# Patient Record
Sex: Male | Born: 1955 | Race: White | Hispanic: No | Marital: Single | Smoking: Never smoker
Health system: Southern US, Community
[De-identification: ages and names within clinical notes are randomized; demographics above are authoritative.]

## PROBLEM LIST (undated history)

## (undated) DIAGNOSIS — Z96619 Presence of unspecified artificial shoulder joint: Secondary | ICD-10-CM

## (undated) DIAGNOSIS — M109 Gout, unspecified: Secondary | ICD-10-CM

## (undated) DIAGNOSIS — F419 Anxiety disorder, unspecified: Secondary | ICD-10-CM

## (undated) DIAGNOSIS — M1990 Unspecified osteoarthritis, unspecified site: Secondary | ICD-10-CM

## (undated) DIAGNOSIS — T7840XA Allergy, unspecified, initial encounter: Secondary | ICD-10-CM

## (undated) DIAGNOSIS — Z87442 Personal history of urinary calculi: Secondary | ICD-10-CM

## (undated) DIAGNOSIS — K219 Gastro-esophageal reflux disease without esophagitis: Secondary | ICD-10-CM

## (undated) DIAGNOSIS — I10 Essential (primary) hypertension: Secondary | ICD-10-CM

## (undated) DIAGNOSIS — F4321 Adjustment disorder with depressed mood: Secondary | ICD-10-CM

## (undated) DIAGNOSIS — G43909 Migraine, unspecified, not intractable, without status migrainosus: Secondary | ICD-10-CM

## (undated) DIAGNOSIS — T84038A Mechanical loosening of other internal prosthetic joint, initial encounter: Secondary | ICD-10-CM

## (undated) DIAGNOSIS — T8459XA Infection and inflammatory reaction due to other internal joint prosthesis, initial encounter: Secondary | ICD-10-CM

## (undated) DIAGNOSIS — R7989 Other specified abnormal findings of blood chemistry: Secondary | ICD-10-CM

## (undated) DIAGNOSIS — Z973 Presence of spectacles and contact lenses: Secondary | ICD-10-CM

## (undated) HISTORY — PX: SHOULDER ARTHROSCOPY: SHX128

## (undated) HISTORY — DX: Gout, unspecified: M10.9

## (undated) HISTORY — PX: TOTAL SHOULDER ARTHROPLASTY: SHX126

## (undated) HISTORY — PX: EYE SURGERY: SHX253

## (undated) HISTORY — DX: Other specified abnormal findings of blood chemistry: R79.89

## (undated) HISTORY — PX: TOTAL KNEE ARTHROPLASTY: SHX125

## (undated) HISTORY — PX: SHOULDER OPEN ROTATOR CUFF REPAIR: SHX2407

## (undated) HISTORY — PX: BACK SURGERY: SHX140

## (undated) HISTORY — PX: LASIK: SHX215

## (undated) HISTORY — PX: SPINE SURGERY: SHX786

## (undated) HISTORY — PX: COLONOSCOPY W/ BIOPSIES AND POLYPECTOMY: SHX1376

## (undated) HISTORY — DX: Infection and inflammatory reaction due to other internal joint prosthesis, initial encounter: T84.59XA

## (undated) HISTORY — DX: Allergy, unspecified, initial encounter: T78.40XA

## (undated) HISTORY — PX: JOINT REPLACEMENT: SHX530

## (undated) HISTORY — PX: WISDOM TOOTH EXTRACTION: SHX21

## (undated) HISTORY — PX: KNEE ARTHROSCOPY: SHX127

## (undated) HISTORY — PX: COLONOSCOPY: SHX174

## (undated) HISTORY — PX: LUMBAR DISC SURGERY: SHX700

## (undated) HISTORY — PX: TOTAL SHOULDER REPLACEMENT: SUR1217

## (undated) HISTORY — DX: Presence of unspecified artificial shoulder joint: Z96.619

---
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
## 1998-06-13 ENCOUNTER — Emergency Department (HOSPITAL_COMMUNITY): Admission: EM | Admit: 1998-06-13 | Discharge: 1998-06-13 | Payer: Self-pay

## 1998-07-24 ENCOUNTER — Emergency Department (HOSPITAL_COMMUNITY): Admission: EM | Admit: 1998-07-24 | Discharge: 1998-07-24 | Payer: Self-pay | Admitting: Emergency Medicine

## 1998-08-07 ENCOUNTER — Emergency Department (HOSPITAL_COMMUNITY): Admission: EM | Admit: 1998-08-07 | Discharge: 1998-08-08 | Payer: Self-pay

## 1998-08-14 ENCOUNTER — Emergency Department (HOSPITAL_COMMUNITY): Admission: EM | Admit: 1998-08-14 | Discharge: 1998-08-14 | Payer: Self-pay | Admitting: Internal Medicine

## 1999-07-14 ENCOUNTER — Encounter: Payer: Self-pay | Admitting: Specialist

## 1999-07-21 ENCOUNTER — Inpatient Hospital Stay (HOSPITAL_COMMUNITY): Admission: RE | Admit: 1999-07-21 | Discharge: 1999-07-26 | Payer: Self-pay | Admitting: Specialist

## 1999-07-24 ENCOUNTER — Encounter: Payer: Self-pay | Admitting: Internal Medicine

## 1999-08-17 ENCOUNTER — Emergency Department (HOSPITAL_COMMUNITY): Admission: EM | Admit: 1999-08-17 | Discharge: 1999-08-17 | Payer: Self-pay | Admitting: Emergency Medicine

## 1999-10-06 ENCOUNTER — Emergency Department (HOSPITAL_COMMUNITY): Admission: EM | Admit: 1999-10-06 | Discharge: 1999-10-07 | Payer: Self-pay | Admitting: Emergency Medicine

## 2000-10-10 ENCOUNTER — Encounter: Admission: RE | Admit: 2000-10-10 | Discharge: 2000-11-14 | Payer: Self-pay | Admitting: Neurosurgery

## 2002-03-30 ENCOUNTER — Emergency Department (HOSPITAL_COMMUNITY): Admission: EM | Admit: 2002-03-30 | Discharge: 2002-03-30 | Payer: Self-pay | Admitting: Emergency Medicine

## 2003-05-13 ENCOUNTER — Inpatient Hospital Stay (HOSPITAL_COMMUNITY): Admission: RE | Admit: 2003-05-13 | Discharge: 2003-05-19 | Payer: Self-pay | Admitting: Specialist

## 2003-06-08 ENCOUNTER — Emergency Department (HOSPITAL_COMMUNITY): Admission: EM | Admit: 2003-06-08 | Discharge: 2003-06-08 | Payer: Self-pay | Admitting: Emergency Medicine

## 2006-02-08 ENCOUNTER — Encounter: Admission: RE | Admit: 2006-02-08 | Discharge: 2006-02-08 | Payer: Self-pay | Admitting: Specialist

## 2006-12-26 ENCOUNTER — Encounter: Admission: RE | Admit: 2006-12-26 | Discharge: 2006-12-26 | Payer: Self-pay | Admitting: Specialist

## 2009-02-15 ENCOUNTER — Ambulatory Visit (HOSPITAL_COMMUNITY): Admission: RE | Admit: 2009-02-15 | Discharge: 2009-02-15 | Payer: Self-pay | Admitting: Specialist

## 2009-03-03 ENCOUNTER — Encounter: Admission: RE | Admit: 2009-03-03 | Discharge: 2009-03-03 | Payer: Self-pay

## 2009-12-10 ENCOUNTER — Ambulatory Visit: Payer: Self-pay | Admitting: Cardiology

## 2009-12-10 ENCOUNTER — Observation Stay (HOSPITAL_COMMUNITY)
Admission: EM | Admit: 2009-12-10 | Discharge: 2009-12-10 | Payer: Self-pay | Source: Home / Self Care | Admitting: Emergency Medicine

## 2009-12-10 ENCOUNTER — Encounter (INDEPENDENT_AMBULATORY_CARE_PROVIDER_SITE_OTHER): Payer: Self-pay | Admitting: Emergency Medicine

## 2010-07-01 ENCOUNTER — Ambulatory Visit (HOSPITAL_BASED_OUTPATIENT_CLINIC_OR_DEPARTMENT_OTHER)
Admission: RE | Admit: 2010-07-01 | Discharge: 2010-07-01 | Payer: Self-pay | Source: Home / Self Care | Attending: Family Medicine | Admitting: Family Medicine

## 2010-07-27 ENCOUNTER — Encounter: Payer: Self-pay | Admitting: Family Medicine

## 2010-09-11 LAB — POCT CARDIAC MARKERS
CKMB, poc: <1 ng/mL — ABNORMAL LOW (ref 1.0–8.0)
CKMB, poc: <1 ng/mL — ABNORMAL LOW (ref 1.0–8.0)
Myoglobin, poc: 101 ng/mL (ref 12–200)
Myoglobin, poc: 91.1 ng/mL (ref 12–200)
Troponin i, poc: <0.05 ng/mL (ref 0.00–0.09)
Troponin i, poc: <0.05 ng/mL (ref 0.00–0.09)

## 2010-09-11 LAB — BASIC METABOLIC PANEL
BUN: 14 mg/dL (ref 6–23)
CO2: 24 mEq/L (ref 19–32)
Calcium: 9.7 mg/dL (ref 8.4–10.5)
Chloride: 108 mEq/L (ref 96–112)
Creatinine, Ser: 1.17 mg/dL (ref 0.4–1.5)
GFR calc Af Amer: >60 mL/min (ref >60)
GFR calc non Af Amer: >60 mL/min (ref >60)
Glucose, Bld: 122 mg/dL — ABNORMAL HIGH (ref 70–99)
Potassium: 3.5 mEq/L (ref 3.5–5.1)
Sodium: 140 mEq/L (ref 135–145)

## 2010-09-11 LAB — DIFFERENTIAL
Basophils Absolute: 0 10*3/uL (ref 0.0–0.1)
Basophils Relative: 0 % (ref 0–1)
Eosinophils Absolute: 0.1 10*3/uL (ref 0.0–0.7)
Eosinophils Relative: 2 % (ref 0–5)
Lymphocytes Relative: 24 % (ref 12–46)
Lymphs Abs: 1.6 10*3/uL (ref 0.7–4.0)
Monocytes Absolute: 0.6 10*3/uL (ref 0.1–1.0)
Monocytes Relative: 9 % (ref 3–12)
Neutro Abs: 4.3 10*3/uL (ref 1.7–7.7)
Neutrophils Relative %: 65 % (ref 43–77)

## 2010-09-11 LAB — CBC
HCT: 46.5 % (ref 39.0–52.0)
Hemoglobin: 16.1 g/dL (ref 13.0–17.0)
MCHC: 34.7 g/dL (ref 30.0–36.0)
MCV: 96.8 fL (ref 78.0–100.0)
Platelets: 152 10*3/uL (ref 150–400)
RBC: 4.81 MIL/uL (ref 4.22–5.81)
RDW: 13.4 % (ref 11.5–15.5)
WBC: 6.6 10*3/uL (ref 4.0–10.5)

## 2010-09-11 LAB — D-DIMER, QUANTITATIVE: D-Dimer, Quant: 0.38 ug/mL-FEU (ref 0.00–0.48)

## 2010-09-28 ENCOUNTER — Ambulatory Visit (HOSPITAL_COMMUNITY)
Admission: RE | Admit: 2010-09-28 | Discharge: 2010-09-28 | Disposition: A | Discharge status: Home / Self Care | Payer: BC Managed Care – PPO | Source: Ambulatory Visit | Attending: Gastroenterology | Admitting: Gastroenterology

## 2010-09-28 DIAGNOSIS — Z860100 Personal history of colon polyps, unspecified: Secondary | ICD-10-CM | POA: Insufficient documentation

## 2010-09-28 DIAGNOSIS — K648 Other hemorrhoids: Secondary | ICD-10-CM | POA: Insufficient documentation

## 2010-09-28 DIAGNOSIS — I10 Essential (primary) hypertension: Secondary | ICD-10-CM | POA: Insufficient documentation

## 2010-10-16 NOTE — Op Note (Signed)
  NAME:  Geoffrey West, Geoffrey West NO.:  [id number]  MEDICAL RECORD NO.:  [id number]           PATIENT TYPE:  O  LOCATION:  [facility]                         FACILITY:  [facility]  PHYSICIAN:  Shirley Friar, MDDATE OF BIRTH:  09-15-1955  DATE OF PROCEDURE:  09/28/2010 DATE OF DISCHARGE:                              OPERATIVE REPORT   INDICATIONS:  History of colon polyp, need to further evaluate rectum for any recurrence.  MEDICATIONS:  Benadryl 25 mg IV, Versed 6 mg IV.  FINDINGS:  Rectal exam was unremarkable.  A pediatric colonoscope was inserted through a well-prepped colon and inserted into the rectum.  The rectum was unremarkable without evidence of any recurrent polyps or mucosal abnormalities.  The colonoscope was advanced in to the sigmoid colon where the patient developed discomfort and no further advancement was done.  On careful withdrawal of the colonoscope from the sigmoid colon, there were no mucosal abnormalities seen.  The rectum was again reevaluated and was unremarkable.  Retroflexion was done which revealed small internal hemorrhoids.  ASSESSMENT: 1. Small internal hemorrhoids, otherwise normal flexible sigmoidoscopy     of the sigmoid colon. 2. No recurrent polyp seen in rectum.  PLAN:  Repeat colonoscopy in May 2018, which is 10 years from his last colonoscopy.     Shirley Friar, MD     VCS/MEDQ  D:  09/28/2010  T:  09/29/2010  Job:  [id number]  cc:   [name], M.D.  Electronically Signed by Charlott Rakes MD on 10/16/2010 11:41:25 AM

## 2010-11-11 NOTE — Discharge Summary (Signed)
NAME:  Geoffrey West, Geoffrey West                        ACCOUNT NO.:  [id number]   MEDICAL RECORD NO.:  [id number]                   PATIENT TYPE:  INP   LOCATION:  0471                                 FACILITY:  [facility]   PHYSICIAN:  Erasmo Leventhal, M.D.         DATE OF BIRTH:  May 29, 1956   DATE OF ADMISSION:  05/13/2003  DATE OF DISCHARGE:  05/19/2003                                 DISCHARGE SUMMARY   ADMISSION DIAGNOSIS:  Total knee arthroplasty, left knee.   DISCHARGE DIAGNOSES:  1. Total knee arthroplasty, left knee.  2. Colitis.   BRIEF HISTORY:  This is a 55 year old gentleman with a long history of  osteoarthritis, previous total knee replacement of his right knee, has been  having problems with his left knee, and has failed all conservative measures  including arthroscopy, debridement, chondroplasty, medications and  injections.  Because of significant pain, he requests total knee  arthroplasty.  Surgery risks, benefits, and aftercare were discussed in  detail.  Questions were invited and answered and surgery is to go ahead as  scheduled.   LABORATORY VALUES:  Admission CBC within normal limits with the exception of  7 eosinophils.  Hemoglobin and hematocrit reached a low of 11.9 and 33.8 on  the 21st.  PT and INR's were normal at admission and were 19.7 with an INR  of 2.1 on the 23rd.  Admission CMET showed the potassium low at 3, otherwise  within normal limits.  He did have some problems with hypokalemia through  the admission and his last BMET showed that to have cleared.  Glucose was  elevated intermittently throughout the admission and was 110 on discharge.  Stool cultures for Clostridium difficile toxin were negative.   COURSE IN THE HOSPITAL:  The patient tolerated the operative procedure well.  The first postoperative day he was feeling pretty good, his vital signs were  stable, he was afebrile with oxygen saturation of 95%.  Hemoglobin and  hematocrit were  stable.  His potassium came back up to 3.4, glucose was  mildly elevated.  I&O's were good.  His drain was removed.  Lungs were  clear.  Calves were negative.  He was subsequently started on bed-to-chair  and CPM.  The second postoperative day vital signs remained stable.  Temp  was to 101.2 last night but now 99.4.  Hemoglobin and hematocrit were  stable, BMET within normal limits with the exception of slightly elevated  glucose.  PT was 14.7 with INR 1.2.  Lungs were clear except right bases had  slightly lower sounds.  He was encouraged in incentive spirometer use two  times an hour while awake.  His epidural was subsequently removed.  Portable  chest x-ray was obtained to rule out pneumonia and this just showed shallow  lung inflation with cardiomegaly.  Due to his continued fever, a medical  consult was obtained.  He had had a previous sinusitis prior to admission  that  was being treated by [name], M.D. his family physician.  Subsequently in the hospital, he was started on Avelox 400 mg daily, Nasonex  by a medical consult.  The third postoperative day he started having  increased gas, flatus, and a loose bowel movement.  There was a question of  partial ileus and he was subsequently switched to a full liquid diet. The  following day he continued to have diarrhea and gas.  His bowels were  active.  His abdomen was soft.  Stool was checked for Clostridium difficile  and guaiac and subsequent consult with general surgery was obtained.  He was  felt to have Clostridium difficile colitis.  He was slightly hypokalemic and  he was placed on oral potassium supplements and other antibiotics were  stopped and Flagyl was started.  The fourth postoperative day he was feeling  better, he was resting comfortably, temp was to a max of 101.8, vital signs  were stable.  He was afebrile at times.  His bowel sounds were slowing down.  His abdomen was soft.  He continued with total knee  precautions and PT and  OT.  His potassium continued to be corrected by medical service.  Postoperative day #5, he was feeling much better and wanted to go home.  His  vital signs were stable.  He was afebrile.  Calf was negative.  Abdominal  pain was decreased and he was still having six to seven small bowel  movements.  It was felt he was not ready to go home at this time and was  kept for another day of observation.  [name], M.D. continued him  on a 10-day course of Flagyl at 500 mg t.i.d. and recommended follow up with  his medical doctor at discharge.  On the sixth postoperative day, he is  feeling good.  He was ready to go home.  Vital signs were stable.  He was  afebrile.  Bowel movements were down to just two the previous day.  No  complaints of abdominal pain.  He was stabilized and feeling better and  subsequently is discharged home.   CONDITION ON DISCHARGE:  Improved.   DISCHARGE MEDICATIONS:  1. Percocet one to two pills every 6 hours as needed for pain.  2. Robaxin 500 mg one p.o. q.8 h. p.r.n. spasm.  3. Coumadin as directed by pharmacy.  4. Trinsicon one pill twice a day for anemia.  5. Flagyl 500 mg p.o. t.i.d. for 10 days total.   DISCHARGE INSTRUCTIONS:  He is to do his home therapy, eat a banana a day  and drink Gatorade, slowly advance his diet.  He is to see [name],  M.D. the week of discharge for follow up of his colitis and to see [place] in 10  days.     [name], P.A.                   Erasmo Leventhal, M.D.    SJC/MEDQ  D:  06/06/2003  T:  06/06/2003  Job:  [id number]

## 2010-11-11 NOTE — H&P (Signed)
NAME:  Geoffrey West, Geoffrey West NO.:  [id number]   MEDICAL RECORD NO.:  [id number]                   PATIENT TYPE:  INP   LOCATION:  NA                                   FACILITY:  [facility]   PHYSICIAN:  Erasmo Leventhal, M.D.         DATE OF BIRTH:  August 31, 1955   DATE OF ADMISSION:  DATE OF DISCHARGE:                                HISTORY & PHYSICAL   CHIEF COMPLAINT:  Left knee end-stage osteoarthritis.   HISTORY OF PRESENT ILLNESS:  This is a 55 year old gentleman with a long  history of osteoarthritis with previous total knee replacement of his right  knee who has been having problems with his left knee that has failed all  conservative measures including arthroscopy, debridement, and chondroplasty.  After discussion of treatment, risks, benefits, and options, the patient is  scheduled now for total knee arthroplasty of the left knee.   DRUG ALLERGIES:  OXYCONTIN.   CURRENT MEDICATIONS:  1. Atenolol. He is unsure of the dosage.  2. Lipitor 10 mg q.d.  3. Accutane once a week on Monday.   PAST SURGICAL HISTORY:  1. Right rotator cuff repair.  2. Left knee arthroscopy.  3. Right knee arthroscopy.  4. Right knee total knee replacement.   SERIOUS MEDICAL ILLNESSES:  1. Hypertension.  2. Hypercholesterolemia.  3. Acne.   FAMILY HISTORY:  Positive for coronary artery disease and arthritis.   SOCIAL HISTORY:  The patient is single. He works as a Runner, broadcasting/film/video. He does not  smoke or drink.   REVIEW OF SYSTEMS:  NERVOUS SYSTEM:  Positive for migraine headaches,  otherwise negative for blurry vision or dizziness. PULMONARY:  Negative for  shortness of breath, PND, or orthopnea. CARDIOVASCULAR:  Positive for  hypertension. Negative for chest pain or palpitations.  GASTROINTESTINAL:  Negative for ulcers or hepatitis. GENITOURINARY:  Negative for urinary tract difficulty. MUSCULOSKELETAL:  Positives in HPI.   PHYSICAL EXAMINATION:  VITAL SIGNS:  BP  120/80, 72 for pulse, respirations  16.  GENERAL APPEARANCE:  This is a well-developed, well-nourished gentleman in  no acute distress.  HEENT:  His head is normocephalic. Nose patent. Ears patent. Pupils are  equal, round, and reactive to light. Throat without injection.  NECK:  Supple without adenopathy. Carotids 2+ without bruit.  CHEST:  Clear to auscultation. No rales or rhonchi. Respirations 16.  HEART:  Regular rate and rhythm at 72 beats per minute without murmur.  ABDOMEN:  Soft with active bowel sounds. No masses or organomegaly.  NEUROLOGICAL:  The patient is alert and oriented to time, place, and person.  Cranial nerves II-XII grossly intact.  EXTREMITIES:  Within normal limits with the exception of the right knee  which is status post total knee arthroplasty. He has 0 to 110 degree range  of motion with good stability. Left knee shows a varus deformity pain with  range of motion. Dorsalis pedis and posterior tibialis pulses are 2+. X-rays  show osteoarthritis with a varus deformity of the left knee.   IMPRESSION:  Osteoarthritis with varus deformity, left knee.   PLAN:  Total knee arthroplasty of the left knee.      [name], P.A.                   Erasmo Leventhal, M.D.    SJC/MEDQ  D:  05/06/2003  T:  05/06/2003  Job:  [id number]

## 2010-11-11 NOTE — Op Note (Signed)
NAME:  Geoffrey West, Geoffrey West NO.:  [id number]   MEDICAL RECORD NO.:  [id number]                   PATIENT TYPE:  INP   LOCATION:  0011                                 FACILITY:  [facility]   PHYSICIAN:  Erasmo Leventhal, M.D.         DATE OF BIRTH:  Sep 05, 1955   DATE OF PROCEDURE:  05/13/2003  DATE OF DISCHARGE:                                 OPERATIVE REPORT   PREOPERATIVE DIAGNOSIS:  Left knee end-stage osteoarthritis.   POSTOPERATIVE DIAGNOSIS:  Left knee end-stage osteoarthritis.   PROCEDURE:  Left total knee arthroplasty.   SURGEON:  R. Valma Cava, M.D.   ASSISTANT:  [name], P.A.-C   ANESTHESIA:  Spinal epinephrine.   ESTIMATED BLOOD LOSS:  Less than 50 mL.   DRAINS:  Two medium Hemovac.   COMPLICATIONS:  None.   TOURNIQUET TIME:  1 hour 50 minutes at 350 mmHg.   DISPOSITION:  PACU stable.   OPERATIVE IMPLANTS:  Osteonics components all cemented. A size 11 femur,  size 11 tibia, 10 mm flex insert with a 28 mm patella.   DESCRIPTION OF PROCEDURE:  The patient was counseled in the holding area. He  had undergone preoperative medical clearance from his primary care  physician, Dr. Marinda Elk, from a respiratory tract infection he had  recently. He was afebrile and relatively asymptomatic at this time. The  chart was viewed and signed appropriately, taken to the operating room.  Preoperative Ancef was given 1 g. Spinal epidural was administered. Properly  padded and bumped. Foley catheter placed utilizing sterile technique by the  OR circulator. The left lower extremity was elevated, 5 degree flexion  contracture with flexion to 120 degrees. Elevated, prepped with DuraPrep and  all draped in a sterile fashion. Exsanguinated with esmarch and tourniquet  was inflated to 350 mmHg.   A straight midline incision was made through the skin and subcutaneous  tissue, small bleeders electrocoagulated. Medial and lateral soft tissue  flaps were developed at the appropriate level. Medial parapatellar  arthrotomy was performed, soft tissue released down on the proximal medial  tibia due to the knee being in varus. The knee was then flexed, patella was  everted, end-stage arthritic changes, a large amount of bone against bone  areas, a lot of synovitis and a synovectomy performed.   The cruciate ligaments were resected. A starting hole made in the distal  femur, canal was irrigated until the effluent was clear, intramedullary rod  was gently placed. This gentleman had very hard bone.   I chose a 5 degree valgus cut for the left knee, took a 10 mm cut off the  distal femur, distal femur was found to be a size 11. Rotation marks were  made and the distal femur was cut to fit a size #11. Rotational marks.   Tibial eminence was resected, medial and lateral menisci removed, geniculate  vessels coagulated, posterior neurovascular structures were thought of and  protected throughout the entire case. Osteophytes were removed from the  proximal and medial tibia. The proximal tibia was found to be a size 11,  starting hole was made, step-drill was utilized. The canal was irrigated  until the effluent was clear, intramedullary rod was gently placed. I chose  a 10 mm cut off the lateral side which was the least deficient side and it  was surgically sloped, the proximal tibia was cut. Posteromedial and  posterofemoral osteophytes removed under direct visualization. The femoral  cut was prepared in standard fashion. At this point in time, a size 11  femur, size 11 tibia with a 10 mm flex insert, we had excellent range of  motion, soft tissue balance and alignment and rotation marks were made and  the delta keel was performed in standard fashion.   The patella was found to be a size 28 and was reamed to a depth of 10 mm,  locking holes were made and excess bone was removed. At this time, the knee  was then irrigated with pulsatile  lavage copiously and the cement was  properly mixed on the back table utilizing modern cement technique. All  components were cemented into place, size 11 tibia, size 11 femur with a 28  patella. After the cement had cured with the 10 mm flex insert, we had  excellent range of motion, soft tissue balance, and flexion extension and  patellofemoral tracking was anatomic. The trial was removed, excess cement  was removed, geniculates were recoagulated. The knee was thoroughly  irrigated with antibiotic solution. A final 10 mm flex tibial insert pole  was implanted. At this point in time, we put bone wax on exposed bony  surfaces, two medium Hemovac drains were placed. The knee joint and all soft  tissue was irrigated with antibiotic solution during the closure.   The arthrotomy was closed with Vicryl, subcu Vicryl and skin closed with  subcuticular Monocryl suture. Steri-Strips were applied, sterile compressive  dressing with Xeroform around the drain. The drain was later hooked to  suction. I will also note during the receiving of the implants, a 20 mm  patella button was given to me and I accidentally dropped this on the floor,  it was not used. We had to get another 28 mm patella.   Sterile compressive dressing applied to the knee, tourniquet was deflated.  We had normal pulses in the foot and ankle at the end of the case. There  were no complications, sponge and needle count were correct. He was given  another gram of Ancef intravenously, tourniquet deflated, taken from the  operating room to PACU in stable condition.                                               Erasmo Leventhal, M.D.    RAC/MEDQ  D:  05/13/2003  T:  05/13/2003  Job:  [id number]

## 2010-11-11 NOTE — Consult Note (Signed)
NAME:  Geoffrey West, Geoffrey West                        ACCOUNT NO.:  [id number]   MEDICAL RECORD NO.:  [id number]                   PATIENT TYPE:  INP   LOCATION:  0471                                 FACILITY:  [facility]   PHYSICIAN:  [name], M.D.            DATE OF BIRTH:  04-Oct-1955   DATE OF CONSULTATION:  05/16/2003  DATE OF DISCHARGE:                                   CONSULTATION   PHYSICIAN REQUESTING CONSULTATION:  Dr. Jene Every   REASON FOR CONSULTATION:  Abdominal pain, distention, diarrhea.   HISTORY OF PRESENT ILLNESS:  Mr. Geoffrey West is a 55 year old male who is postop  day #3 from a left total knee replacement by Dr. Hayden Rasmussen.  Approximately a day and a half ago he began to have some crampy abdominal  pain, flatus, and now has developed some diarrhea and distention.  He also  has low-grade fever.  He reports that he had been on amoxicillin immediately  prior to surgery for a sinus infection.  He has had Avelox started on  May 15, 2003.  He is also running some fever.  He states he had a right  knee replacement and had something similar to this but he thought he was  self-limited.   PAST MEDICAL HISTORY:  1. Hypertension.  2. Hyperlipidemia.  3. Degenerative joint disease.  4. Migraine headaches.   PREVIOUS OPERATIONS:  1. Right total knee replacement.  2. He had right knee surgery and ACL reconstruction multiple times prior to     that.  3. Rotator cuff repair x2.  4. Left knee arthroscopy.   ALLERGIES:  None.   CURRENT MEDICATIONS:  Colace, Senokot, Trinsicon, warfarin, Zocor, Afrin,  Tenormin, Dilaudid, Avelox, Aciphex, Pepcid.   SOCIAL HISTORY:  Non-tobacco user, denies alcohol use.   REVIEW OF SYSTEMS:  CARDIAC:  He has hypertension but no known coronary  disease.  PULMONARY:  He has no asthma or COPD.  GI:  He denies hepatitis,  diverticulitis, peptic ulcer disease, reflux.  GU:  No kidney stones.   PHYSICAL EXAMINATION:  GENERAL:   Well-developed, well-nourished male who is  in no acute distress.  VITAL SIGNS:  His max temperature is 100.6, blood pressure is 134/79, pulse  94.  EYES:  Extraocular muscles are intact, no icterus noted.  RESPIRATORY:  The breath sounds are equal and clear, and respirations  unlabored.  ABDOMEN:  Soft with some moderate distention.  There is no tenderness to  palpation or percussion.  No hernia is noted.  He has active bowel sounds  present.  GU:  No inguinal hernias noted.  No penile lesions.   Abdominal x-rays demonstrate gas-filled colon with some gas in the small  bowel.  There is some thumbprinting in the right colon and possibly  transverse colon which would suggest mucosal edema radiographically.  No  free air.   LABORATORY DATA:  Hemoglobin 12.4, white count 10,400.  Potassium 3.3.  INR  1.4.   No fecal occult blood noted.  C. diff is pending.   IMPRESSION:  Intermittent lower abdominal pain with diarrhea, distention and  x-ray.  His history, physical exam, and x-ray findings are suggestive of  Clostridium difficile colitis possibly related to preoperative amoxicillin.  He also has some hypokalemia.   RECOMMENDATIONS:  Correct potassium.  Start oral Flagyl or switch it to IV  if needed.  Discontinue the Avelox.  Keep him on a liquid diet for now.  If  the first C. diff is negative, I would repeat it.                                               [name], M.D.    Kari Baars  D:  05/16/2003  T:  05/16/2003  Job:  [id number]   cc:   Jene Every, M.D.  [address]  [place]  [place] [address]  Fax: [phone]   Erasmo Leventhal, M.D.  [address]  [place]  [place] [address]  Fax: [phone]

## 2011-01-05 ENCOUNTER — Other Ambulatory Visit: Payer: Self-pay | Admitting: Orthopaedic Surgery

## 2011-01-05 DIAGNOSIS — M545 Low back pain, unspecified: Secondary | ICD-10-CM

## 2011-01-08 ENCOUNTER — Ambulatory Visit
Admission: RE | Admit: 2011-01-08 | Discharge: 2011-01-08 | Disposition: A | Discharge status: Home / Self Care | Payer: BC Managed Care – PPO | Source: Ambulatory Visit | Attending: Orthopaedic Surgery | Admitting: Orthopaedic Surgery

## 2011-01-08 DIAGNOSIS — M545 Low back pain, unspecified: Secondary | ICD-10-CM

## 2012-05-18 ENCOUNTER — Encounter (HOSPITAL_BASED_OUTPATIENT_CLINIC_OR_DEPARTMENT_OTHER): Payer: Self-pay | Admitting: *Deleted

## 2012-05-18 ENCOUNTER — Emergency Department (HOSPITAL_BASED_OUTPATIENT_CLINIC_OR_DEPARTMENT_OTHER)
Admission: EM | Admit: 2012-05-18 | Discharge: 2012-05-18 | Disposition: A | Discharge status: Home / Self Care | Payer: BC Managed Care – PPO | Attending: Emergency Medicine | Admitting: Emergency Medicine

## 2012-05-18 DIAGNOSIS — S058X9A Other injuries of unspecified eye and orbit, initial encounter: Secondary | ICD-10-CM | POA: Insufficient documentation

## 2012-05-18 DIAGNOSIS — T1590XA Foreign body on external eye, part unspecified, unspecified eye, initial encounter: Secondary | ICD-10-CM | POA: Insufficient documentation

## 2012-05-18 DIAGNOSIS — H5789 Other specified disorders of eye and adnexa: Secondary | ICD-10-CM | POA: Insufficient documentation

## 2012-05-18 DIAGNOSIS — Z79899 Other long term (current) drug therapy: Secondary | ICD-10-CM | POA: Insufficient documentation

## 2012-05-18 DIAGNOSIS — I10 Essential (primary) hypertension: Secondary | ICD-10-CM | POA: Insufficient documentation

## 2012-05-18 DIAGNOSIS — S0500XA Injury of conjunctiva and corneal abrasion without foreign body, unspecified eye, initial encounter: Secondary | ICD-10-CM

## 2012-05-18 DIAGNOSIS — H5710 Ocular pain, unspecified eye: Secondary | ICD-10-CM | POA: Insufficient documentation

## 2012-05-18 DIAGNOSIS — H579 Unspecified disorder of eye and adnexa: Secondary | ICD-10-CM | POA: Insufficient documentation

## 2012-05-18 DIAGNOSIS — Z23 Encounter for immunization: Secondary | ICD-10-CM | POA: Insufficient documentation

## 2012-05-18 DIAGNOSIS — Y9389 Activity, other specified: Secondary | ICD-10-CM | POA: Insufficient documentation

## 2012-05-18 DIAGNOSIS — Y9289 Other specified places as the place of occurrence of the external cause: Secondary | ICD-10-CM | POA: Insufficient documentation

## 2012-05-18 HISTORY — DX: Essential (primary) hypertension: I10

## 2012-05-18 MED ORDER — TETANUS-DIPHTH-ACELL PERTUSSIS 5-2.5-18.5 LF-MCG/0.5 IM SUSP
0.5000 mL | Freq: Once | INTRAMUSCULAR | Status: AC
  Administered 2012-05-18: 0.5 mL via INTRAMUSCULAR
  Filled 2012-05-18: qty 0.5

## 2012-05-18 MED ORDER — TETRACAINE HCL 0.5 % OP SOLN
2.0000 [drp] | Freq: Once | OPHTHALMIC | Status: AC
  Administered 2012-05-18: 2 [drp] via OPHTHALMIC

## 2012-05-18 MED ORDER — TETRACAINE HCL 0.5 % OP SOLN
OPHTHALMIC | Status: AC
  Administered 2012-05-18: 2 [drp] via OPHTHALMIC
  Filled 2012-05-18: qty 2

## 2012-05-18 MED ORDER — FLUORESCEIN SODIUM 1 MG OP STRP
1.0000 | ORAL_STRIP | Freq: Once | OPHTHALMIC | Status: AC
  Administered 2012-05-18: 1 via OPHTHALMIC

## 2012-05-18 MED ORDER — FLUORESCEIN SODIUM 1 MG OP STRP
ORAL_STRIP | OPHTHALMIC | Status: AC
  Administered 2012-05-18: 1 via OPHTHALMIC
  Filled 2012-05-18: qty 1

## 2012-05-18 MED ORDER — TOBRAMYCIN-DEXAMETHASONE 0.3-0.1 % OP SUSP: 1.0000 [drp] | OPHTHALMIC | Status: DC

## 2012-05-18 NOTE — ED Provider Notes (Signed)
History  This chart was scribed for Shereena Berquist Smitty Cords, MD by Shari Heritage, ED Scribe. The patient was seen in room MH04/MH04. Patient's care was started at 1930.   CSN: [id number]  Arrival date & time 05/18/12  4098   First MD Initiated Contact with Patient 05/18/12 1930      Chief Complaint  Patient presents with  . Foreign Body in Eye     Patient is a 56 y.o. male presenting with foreign body in eye. The history is provided by the patient. No language interpreter was used.  Foreign Body in Eye This is a new problem. The current episode started 3 to 5 hours ago. The problem occurs constantly. The problem has not changed since onset.Pertinent negatives include no chest pain. Nothing aggravates the symptoms. Nothing relieves the symptoms. He has tried nothing (water flushing, drops) for the symptoms. The treatment provided no relief.    HPI Comments: Geoffrey West is a 56 y.o. male who presents to the Emergency Department complaining of foreign body in right eye with associated mild to moderate pain, redness, tearing and itching onset 2-3 hours ago. Patient denies blurred vision or other visual changes. Patient states that he was working outdoors today and a piece of plant brush grazed his eye. He states that flushed his eye and applied drops, but gritty sensation in his eye has persisted. Patient has a medical history of lasik eye surgery to right eye. He wears a contact in the left eye. Other medical history includes HTN. Patient does not smoke.  Eye surgeon - [place]  Past Medical History  Diagnosis Date  . Hypertension     Past Surgical History  Procedure Date  . Replacement total knee   . Shoulder surgery     History reviewed. No pertinent family history.  History  Substance Use Topics  . Smoking status: Never Smoker   . Smokeless tobacco: Not on file  . Alcohol Use: No      Review of Systems  Eyes: Positive for pain, discharge, redness and itching.  Negative for visual disturbance.  Cardiovascular: Negative for chest pain.  All other systems reviewed and are negative.    Allergies  Fentanyl  Home Medications   Current Outpatient Rx  Name  Route  Sig  Dispense  Refill  . AMLODIPINE BESYLATE PO   Oral   Take by mouth.         . INDERAL PO   Oral   Take by mouth.           Triage Vitals: BP 148/95  Pulse 58  Temp 97.8 F (36.6 C) (Oral)  Resp 20  Ht 5\' 11"  (1.803 m)  Wt 215 lb (97.523 kg)  BMI 29.99 kg/m2  SpO2 100%  Physical Exam  Constitutional: He is oriented to person, place, and time. He appears well-developed and well-nourished. No distress.  HENT:  Head: Normocephalic and atraumatic.  Mouth/Throat: Oropharynx is clear and moist and mucous membranes are normal. Mucous membranes are not dry.  Eyes: EOM are normal. Pupils are equal, round, and reactive to light. Right eye exhibits no chemosis and no discharge. No foreign body present in the right eye. Left eye exhibits no chemosis and no discharge. Right conjunctiva is injected.  Fundoscopic exam:      The right eye shows no AV nicking.  Slit lamp exam:      The right eye shows corneal abrasion.       Corneal abrasion at 2:00 to 4:00  position. No foreign body. Lash line is normal. No swelling of the lids. No cells in the anterior chamber. No AV nicking.  Neck: Normal range of motion.  Cardiovascular: Normal rate and regular rhythm.   No murmur heard. Pulmonary/Chest: Effort normal and breath sounds normal. No respiratory distress. He has no wheezes. He has no rales.  Abdominal: Soft. There is no tenderness. There is no rebound and no guarding.  Musculoskeletal: Normal range of motion.  Neurological: He is alert and oriented to person, place, and time.  Skin: Skin is warm and dry. No rash noted. He is not diaphoretic. No erythema.  Psychiatric: He has a normal mood and affect. His behavior is normal.    ED Course  Procedures (including critical care  time) DIAGNOSTIC STUDIES: Oxygen Saturation is 100% on room air, normal by my interpretation.    COORDINATION OF CARE: 7:37 PM- Patient informed of current plan for treatment and evaluation and agrees with plan at this time.      Labs Reviewed - No data to display No results found.   No diagnosis found.    MDM  Follow up on Monday with Dr. Delaney Meigs for ongoing care.  Patient verbalizes understanding and agrees to follow up    I personally performed the services described in this documentation, which was scribed in my presence. The recorded information has been reviewed and is accurate.    Jasmine Awe, MD 05/18/12 1943

## 2012-05-18 NOTE — ED Notes (Signed)
Pt states he was working outside today and thinks a piece of "brush" may have gotten into his right eye. Flushed, but still uncomfortable. Red, teary, itching. No blurred vision.

## 2013-07-04 ENCOUNTER — Other Ambulatory Visit: Payer: Self-pay | Admitting: Neurological Surgery

## 2013-07-05 DIAGNOSIS — Z5181 Encounter for therapeutic drug level monitoring: Secondary | ICD-10-CM | POA: Insufficient documentation

## 2013-07-05 DIAGNOSIS — G894 Chronic pain syndrome: Secondary | ICD-10-CM | POA: Insufficient documentation

## 2013-07-05 DIAGNOSIS — M5416 Radiculopathy, lumbar region: Secondary | ICD-10-CM | POA: Insufficient documentation

## 2013-07-05 DIAGNOSIS — M5417 Radiculopathy, lumbosacral region: Secondary | ICD-10-CM | POA: Insufficient documentation

## 2013-07-05 DIAGNOSIS — Z0000 Encounter for general adult medical examination without abnormal findings: Secondary | ICD-10-CM | POA: Insufficient documentation

## 2013-07-14 NOTE — Pre-Procedure Instructions (Signed)
[facility]  07/14/2013   Your procedure is scheduled on:  07/23/13  Report to [place]  2 * 3 at 630 AM.  Call this number if you have problems the morning of surgery: [phone]   Remember:   Do not eat food or drink liquids after midnight.   Take these medicines the morning of surgery with A SIP OF WATER: amlodipine,inderal,eye drop   Do not wear jewelry, make-up or nail polish.  Do not wear lotions, powders, or perfumes. You may wear deodorant.  Do not shave 48 hours prior to surgery. Men may shave face and neck.  Do not bring valuables to the hospital.  [facility] is not responsible                  for any belongings or valuables.               Contacts, dentures or bridgework may not be worn into surgery.  Leave suitcase in the car. After surgery it may be brought to your room.  For patients admitted to the hospital, discharge time is determined by your                treatment team.               Patients discharged the day of surgery will not be allowed to drive  home.  Name and phone number of your driver:   Special Instructions: Shower using CHG 2 nights before surgery and the night before surgery.  If you shower the day of surgery use CHG.  Use special wash - you have one bottle of CHG for all showers.  You should use approximately 1/3 of the bottle for each shower.   Please read over the following fact sheets that you were given: Pain Booklet, Coughing and Deep Breathing, Blood Transfusion Information, MRSA Information and Surgical Site Infection Prevention

## 2013-07-15 ENCOUNTER — Inpatient Hospital Stay (HOSPITAL_COMMUNITY)
Admission: RE | Admit: 2013-07-15 | Discharge: 2013-07-15 | Disposition: A | Discharge status: Home / Self Care | Payer: BC Managed Care – PPO | Source: Ambulatory Visit

## 2013-07-23 ENCOUNTER — Encounter (HOSPITAL_COMMUNITY): Admission: RE | Payer: Self-pay | Source: Ambulatory Visit

## 2013-07-23 ENCOUNTER — Inpatient Hospital Stay (HOSPITAL_COMMUNITY)
Admission: RE | Admit: 2013-07-23 | Payer: BC Managed Care – PPO | Source: Ambulatory Visit | Admitting: Neurological Surgery

## 2013-07-23 SURGERY — FOR MAXIMUM ACCESS (MAS) POSTERIOR LUMBAR INTERBODY FUSION (PLIF) 2 LEVEL
Anesthesia: General | Site: Back

## 2013-08-12 ENCOUNTER — Encounter (HOSPITAL_BASED_OUTPATIENT_CLINIC_OR_DEPARTMENT_OTHER): Payer: Medicare Other | Attending: General Surgery

## 2013-08-12 DIAGNOSIS — Z79899 Other long term (current) drug therapy: Secondary | ICD-10-CM | POA: Diagnosis not present

## 2013-08-12 DIAGNOSIS — Y838 Other surgical procedures as the cause of abnormal reaction of the patient, or of later complication, without mention of misadventure at the time of the procedure: Secondary | ICD-10-CM | POA: Diagnosis not present

## 2013-08-12 DIAGNOSIS — S21209A Unspecified open wound of unspecified back wall of thorax without penetration into thoracic cavity, initial encounter: Secondary | ICD-10-CM | POA: Insufficient documentation

## 2013-08-12 DIAGNOSIS — I10 Essential (primary) hypertension: Secondary | ICD-10-CM | POA: Diagnosis not present

## 2013-08-12 NOTE — Progress Notes (Signed)
[facility]  NAME:  Geoffrey West, Geoffrey West              ACCOUNT NO.:  [id number]  MEDICAL RECORD NO.:  [id number]      DATE OF BIRTH:  [birth year]  PHYSICIAN:  Judene Companion, M.D.           VISIT DATE:                                  OFFICE VISIT   This is a 58 year old gentleman who 4 weeks ago, underwent a L5-S1 laminectomy for removal of a bulging disk.  Since that time, he has had an open 5 cm wound that has separated about 2 mm.  Other than that, he has been asymptomatic.  He has had great relief from his left leg pain after he had the disk removed.  He weighs 203 pounds.  His blood pressure is 124/80, respirations 16, temperature 97.7.  Other than that he is very healthy.  He does not take any medicine other than amlodipine for hypertension.  His doctor has put him on doxycycline for treatment of this wound.  He also takes allopurinol for gout.  Rest of his past history is not remarkable.  I debrided this wound and got some nonviable tissue out of it to get it down to good bleeding based and we are going to treat this with collagen and see him back here in a week.  He is going to change the dressing every day after taking a shower and put some more silver collagen on the wound.  So his diagnosis is nonhealing surgical wound of the back following laminectomy.  Other diagnosis is hypertension.     Judene Companion, M.D.     PP/MEDQ  D:  08/12/2013  T:  08/12/2013  Job:  [id number]

## 2013-08-17 ENCOUNTER — Emergency Department (HOSPITAL_COMMUNITY): Payer: Medicare Other

## 2013-08-17 ENCOUNTER — Emergency Department (HOSPITAL_COMMUNITY)
Admission: EM | Admit: 2013-08-17 | Discharge: 2013-08-17 | Disposition: A | Discharge status: Home / Self Care | Payer: Medicare Other | Attending: Emergency Medicine | Admitting: Emergency Medicine

## 2013-08-17 ENCOUNTER — Encounter (HOSPITAL_COMMUNITY): Payer: Self-pay | Admitting: Emergency Medicine

## 2013-08-17 DIAGNOSIS — S43005A Unspecified dislocation of left shoulder joint, initial encounter: Secondary | ICD-10-CM

## 2013-08-17 DIAGNOSIS — I10 Essential (primary) hypertension: Secondary | ICD-10-CM | POA: Insufficient documentation

## 2013-08-17 DIAGNOSIS — S43016A Anterior dislocation of unspecified humerus, initial encounter: Secondary | ICD-10-CM | POA: Insufficient documentation

## 2013-08-17 DIAGNOSIS — Z9889 Other specified postprocedural states: Secondary | ICD-10-CM | POA: Insufficient documentation

## 2013-08-17 DIAGNOSIS — Y9389 Activity, other specified: Secondary | ICD-10-CM | POA: Insufficient documentation

## 2013-08-17 DIAGNOSIS — Y929 Unspecified place or not applicable: Secondary | ICD-10-CM | POA: Insufficient documentation

## 2013-08-17 DIAGNOSIS — X500XXA Overexertion from strenuous movement or load, initial encounter: Secondary | ICD-10-CM | POA: Insufficient documentation

## 2013-08-17 MED ORDER — PROPOFOL 10 MG/ML IV BOLUS
0.5000 mg/kg | Freq: Once | INTRAVENOUS | Status: AC
  Administered 2013-08-17: 130 mg via INTRAVENOUS
  Filled 2013-08-17: qty 20

## 2013-08-17 MED ORDER — ONDANSETRON HCL 4 MG/2ML IJ SOLN
4.0000 mg | Freq: Once | INTRAMUSCULAR | Status: AC
  Administered 2013-08-17: 4 mg via INTRAVENOUS
  Filled 2013-08-17: qty 2

## 2013-08-17 MED ORDER — HYDROMORPHONE HCL PF 1 MG/ML IJ SOLN
1.0000 mg | Freq: Once | INTRAMUSCULAR | Status: AC
  Administered 2013-08-17: 1 mg via INTRAVENOUS
  Filled 2013-08-17: qty 1

## 2013-08-17 NOTE — Discharge Instructions (Signed)
Please read and follow all provided instructions.  Your diagnoses today include:  1. Dislocation of left shoulder joint     Tests performed today include:  An x-ray of the affected area - shows shoulder dislocation, then improvement in shoulder position  Vital signs. See below for your results today.   Medications prescribed:   None  Take any prescribed medications only as directed.  Home care instructions:   Follow any educational materials contained in this packet  Follow R.I.C.E. Protocol:  R - rest your injury   I  - use ice on injury without applying directly to skin  C - compress injury with bandage or splint  E - elevate the injury as much as possible  Follow-up instructions: Please follow-up with your orthopedic physician (bone specialist) in 1 week. Use sling until cleared by your orthopedist.     If you do not have a primary care doctor -- see below for referral information.   Return instructions:   Please return to the Emergency Department if you experience worsening symptoms.   Please return if you have any other emergent concerns.  Additional Information:  Your vital signs today were: BP 133/88   Pulse 59   Temp(Src) 97.8 F (36.6 C) (Oral)   Resp 16   Ht 5\' 11"  (1.803 m)   Wt 204 lb (92.534 kg)   BMI 28.46 kg/m2   SpO2 99% If your blood pressure (BP) was elevated above 135/85 this visit, please have this repeated by your doctor within one month. --------------

## 2013-08-17 NOTE — ED Notes (Signed)
Patient discharged to home with family. NAD. Aldrete score 10. Patient alert oriented moving all 4 extremities.

## 2013-08-17 NOTE — ED Notes (Signed)
Geiple, PA at bedside.  

## 2013-08-17 NOTE — ED Notes (Signed)
MD at bedside. 

## 2013-08-17 NOTE — ED Provider Notes (Signed)
CSN: [id number]     Arrival date & time 08/17/13  0551 History   First MD Initiated Contact with Patient 08/17/13 [phone]     Chief Complaint  Patient presents with  . Shoulder Injury     (Consider location/radiation/quality/duration/timing/severity/associated sxs/prior Treatment) HPI Comments: Patient presents with chief complaint of left shoulder pain. Patient has history of L prosthetic shoulder, surgery performed approximately one year ago in [place]. Patient sees Dr. Theda Sers in [place]. Patient would over in bed at approximately 3:30 and felt a pop and immediate pain. He denies numbness or tingling in his arm. Pain is made worse with any movement. No other treatments prior to arrival. The onset of this condition was acute. The course is constant. Alleviating factors: none.    Patient is a 58 y.o. male presenting with shoulder injury. The history is provided by the patient.  Shoulder Injury Associated symptoms include arthralgias. Pertinent negatives include no abdominal pain, chest pain, coughing, fever, headaches, joint swelling, myalgias, nausea, neck pain, numbness, rash, sore throat, vomiting or weakness.    Past Medical History  Diagnosis Date  . Hypertension    Past Surgical History  Procedure Laterality Date  . Replacement total knee    . Shoulder surgery    . Back surgery     No family history on file. History  Substance Use Topics  . Smoking status: Never Smoker   . Smokeless tobacco: Not on file  . Alcohol Use: No    Review of Systems  Constitutional: Negative for fever and activity change.  HENT: Negative for rhinorrhea and sore throat.   Eyes: Negative for redness.  Respiratory: Negative for cough.   Cardiovascular: Negative for chest pain.  Gastrointestinal: Negative for nausea, vomiting, abdominal pain and diarrhea.  Genitourinary: Negative for dysuria.  Musculoskeletal: Positive for arthralgias. Negative for back pain, gait problem, joint swelling,  myalgias and neck pain.  Skin: Negative for rash and wound.  Neurological: Negative for weakness, numbness and headaches.   Allergies  Fentanyl  Home Medications   Current Outpatient Rx  Name  Route  Sig  Dispense  Refill  . AMLODIPINE BESYLATE PO   Oral   Take by mouth.         . Propranolol HCl (INDERAL PO)   Oral   Take by mouth.         . tobramycin-dexamethasone (TOBRADEX) ophthalmic solution   Right Eye   Place 1 drop into the right eye every 4 (four) hours while awake.   5 mL   0    BP 154/109  Pulse 72  Temp(Src) 97.8 F (36.6 C) (Oral)  Resp 20  Ht 5\' 11"  (1.803 m)  Wt 204 lb (92.534 kg)  BMI 28.46 kg/m2  SpO2 100% Physical Exam  Nursing note and vitals reviewed. Constitutional: He appears well-developed and well-nourished.  HENT:  Head: Normocephalic and atraumatic.  Eyes: Conjunctivae are normal.  Neck: Normal range of motion. Neck supple.  Cardiovascular: Normal pulses.   Pulses:      Radial pulses are 2+ on the right side, and 2+ on the left side.  Musculoskeletal: He exhibits tenderness. He exhibits no edema.       Left shoulder: He exhibits decreased range of motion, tenderness, bony tenderness, deformity and spasm.       Left elbow: Normal.       Left wrist: Normal.       Cervical back: Normal.       Left upper arm: Normal.  Left forearm: Normal.       Left hand: Normal.  Neurological: He is alert. No sensory deficit.  Motor, sensation, and vascular distal to the injury is fully intact.   Skin: Skin is warm and dry.  Psychiatric: He has a normal mood and affect.    ED Course  Procedures (including critical care time) Labs Review Labs Reviewed - No data to display Imaging Review No results found.  EKG Interpretation   None      6:20 AM Patient seen and examined. Work-up initiated. Medications ordered. D/w Dr. Reather Converse. Upper extremity is neurovascularly intact.   Vital signs reviewed and are as follows: Filed Vitals:    08/17/13 0603  BP: 154/109  Pulse: 72  Temp: 97.8 F (36.6 C)  Resp: 20   7:57 AM Patient awake after sedation. States shoulder feels better. Awaiting post-reduction films. Upper extremity continues to be neurovascularly intact with good pulses.  9:14 AM Spoke with Dr. Alvan Dame who reviewed films. Pt to be in sling/immobilizer for 2-3 weeks. He can f/u in [place].   Pt informed of discussion. He has tramadol at home to use for pain.  MDM   Final diagnoses:  Dislocation of left shoulder joint   Patient with dislocation of shoulder, reduced. Neurovascularly intact throughout its entire ED visit. Immobilization performed, followup arranged.   Carlisle Cater, PA-C 08/17/13 [phone]

## 2013-08-17 NOTE — ED Notes (Signed)
Consent at bedside.  

## 2013-08-17 NOTE — ED Notes (Signed)
Patient presents to ED via POV. Patient states that he was sleeping and rolled over on left shoulder and felt a "pop." patient has hx of left shoulder surgery in feb 2014. No complications since then. Patient unable to move arm at shoulder. Able to move all fingers and left arm from wrist down. Patient guarding arm and has it naturally splinted against stomach. Tearful during triage. A&Ox4.

## 2013-08-17 NOTE — Progress Notes (Signed)
Orthopedic Tech Progress Note Patient Details:  Weber Monnier [facility] [birth year] [id number]  Ortho Devices Type of Ortho Device: Arm sling Ortho Device/Splint Interventions: Application   Irish Elders 08/17/2013, 9:38 AM

## 2013-08-17 NOTE — ED Notes (Signed)
Left shoulder immobilizer placed

## 2013-08-18 NOTE — ED Provider Notes (Signed)
Medical screening examination/treatment/procedure(s) were conducted as a shared visit with non-physician practitioner(s) or resident and myself. I personally evaluated the patient during the encounter and agree with the findings and plan unless otherwise indicated.  I have personally reviewed any xrays and/ or EKG's with the provider and I agree with interpretation.  Left shoulder pop and pain since rolling in bed this am, no hx of similar, prosthetic shoulder L done in [place]. Decr rom across body and abduction left shoulder. NV intact distal. No other injuries. Discussed r/b of procedural sedation and reduction attempt, pt agrees. No meal today. No issues with sedation or egg hx.  Procedural sedation Performed by: Mariea Clonts  Consent: Verbal consent obtained. Risks and benefits: risks, benefits and alternatives were discussed Required items: required blood products, implants, devices, and special equipment available  Patient identity confirmed: arm band and provided demographic data  Time out: Immediately prior to procedure a "time out" was called to verify the correct patient, procedure, equipment, support staff and site  Sedation type: moderate (conscious) sedation NPO time confirmed, risks discussed  Sedatives: propofol  Physician Time at Bedside: 15 min  Vitals: Vital signs were monitored during sedation. Cardiac Monitor, pulse oximeter Patient tolerance: Patient tolerated the procedure well with no immediate complications. Comments: Pt with uneventful recovered. Returned to pre-procedural sedation baseline  Left shoulder reduction  Indication: pain, xray showing dislocation, decreased rom  Done by myself with assistance from PA  Traction and countertraction with external rotation/ abduction.  One attempt. Mild pop felt.  Propofol dosing given to total 130 mg.  If in place fup outpt, if still out of place ortho consult.  Fup outpt ortho, repeat xray showed in place, mild  subluxation.  Left shoulder dislocation, L shoulder pain    Mariea Clonts, MD 08/18/13 [phone]

## 2013-08-21 ENCOUNTER — Encounter (HOSPITAL_BASED_OUTPATIENT_CLINIC_OR_DEPARTMENT_OTHER): Payer: BC Managed Care – PPO

## 2013-08-27 ENCOUNTER — Encounter (HOSPITAL_BASED_OUTPATIENT_CLINIC_OR_DEPARTMENT_OTHER): Payer: Medicare Other | Attending: General Surgery

## 2013-08-27 DIAGNOSIS — Y838 Other surgical procedures as the cause of abnormal reaction of the patient, or of later complication, without mention of misadventure at the time of the procedure: Secondary | ICD-10-CM | POA: Insufficient documentation

## 2013-08-27 DIAGNOSIS — T8189XA Other complications of procedures, not elsewhere classified, initial encounter: Secondary | ICD-10-CM | POA: Insufficient documentation

## 2013-08-29 ENCOUNTER — Emergency Department (HOSPITAL_COMMUNITY): Payer: Medicare Other

## 2013-08-29 ENCOUNTER — Encounter (HOSPITAL_COMMUNITY): Payer: Self-pay | Admitting: Emergency Medicine

## 2013-08-29 ENCOUNTER — Emergency Department (HOSPITAL_COMMUNITY)
Admission: EM | Admit: 2013-08-29 | Discharge: 2013-08-30 | Disposition: A | Discharge status: Home / Self Care | Payer: Medicare Other | Attending: Emergency Medicine | Admitting: Emergency Medicine

## 2013-08-29 DIAGNOSIS — Z792 Long term (current) use of antibiotics: Secondary | ICD-10-CM | POA: Insufficient documentation

## 2013-08-29 DIAGNOSIS — S43005A Unspecified dislocation of left shoulder joint, initial encounter: Secondary | ICD-10-CM

## 2013-08-29 DIAGNOSIS — X58XXXA Exposure to other specified factors, initial encounter: Secondary | ICD-10-CM | POA: Insufficient documentation

## 2013-08-29 DIAGNOSIS — I10 Essential (primary) hypertension: Secondary | ICD-10-CM | POA: Insufficient documentation

## 2013-08-29 DIAGNOSIS — Y9389 Activity, other specified: Secondary | ICD-10-CM | POA: Insufficient documentation

## 2013-08-29 DIAGNOSIS — Z791 Long term (current) use of non-steroidal anti-inflammatories (NSAID): Secondary | ICD-10-CM | POA: Insufficient documentation

## 2013-08-29 DIAGNOSIS — Z79899 Other long term (current) drug therapy: Secondary | ICD-10-CM | POA: Insufficient documentation

## 2013-08-29 DIAGNOSIS — T84029A Dislocation of unspecified internal joint prosthesis, initial encounter: Secondary | ICD-10-CM | POA: Insufficient documentation

## 2013-08-29 DIAGNOSIS — Y929 Unspecified place or not applicable: Secondary | ICD-10-CM | POA: Insufficient documentation

## 2013-08-29 DIAGNOSIS — IMO0002 Reserved for concepts with insufficient information to code with codable children: Secondary | ICD-10-CM | POA: Insufficient documentation

## 2013-08-29 DIAGNOSIS — Z96619 Presence of unspecified artificial shoulder joint: Secondary | ICD-10-CM | POA: Insufficient documentation

## 2013-08-29 MED ORDER — PROPOFOL 10 MG/ML IV BOLUS
INTRAVENOUS | Status: DC | PRN
  Administered 2013-08-29: 40 mg via INTRAVENOUS

## 2013-08-29 MED ORDER — HYDROMORPHONE HCL PF 1 MG/ML IJ SOLN
1.0000 mg | Freq: Once | INTRAMUSCULAR | Status: AC
  Administered 2013-08-29: 1 mg via INTRAVENOUS
  Filled 2013-08-29: qty 1

## 2013-08-29 MED ORDER — IBUPROFEN 800 MG PO TABS: 800.0000 mg | ORAL_TABLET | Freq: Three times a day (TID) | ORAL | Status: DC

## 2013-08-29 MED ORDER — PROPOFOL 10 MG/ML IV BOLUS
INTRAVENOUS | Status: AC
  Filled 2013-08-29: qty 20

## 2013-08-29 MED ORDER — PROPOFOL 10 MG/ML IV BOLUS
130.0000 mg | Freq: Once | INTRAVENOUS | Status: DC
  Filled 2013-08-29: qty 20

## 2013-08-29 MED ORDER — HYDROCODONE-ACETAMINOPHEN 5-325 MG PO TABS: 1.0000 | ORAL_TABLET | ORAL | Status: DC | PRN

## 2013-08-29 MED ORDER — PROPOFOL 10 MG/ML IV BOLUS: 0.5000 mg/kg | Freq: Once | INTRAVENOUS | Status: DC

## 2013-08-29 NOTE — ED Notes (Signed)
The pt had his shoulder re[placed feb 2014.  The lt shoulder has been dislaocated x2 since then.  This is the second time.  He was leaning on  A table and the shoulder just  Came out

## 2013-08-29 NOTE — ED Provider Notes (Signed)
CSN: [id number]     Arrival date & time 08/29/13  1931 History   First MD Initiated Contact with Patient 08/29/13 2028     Chief Complaint  Patient presents with  . Shoulder Injury     (Consider location/radiation/quality/duration/timing/severity/associated sxs/prior Treatment) HPI Comments: Geoffrey West is a 58 y.o. 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  He reports while sitting he rested his shoulder on a table and reports a dislocation.  He reports similar pain in and discomfort 2 weeks ago with a previous dislocation.  He reports decrease ROM to left arm and acute onset of pain. No numbness or tingling in the left hand. He denies taking medication for the injury prior to arrival.  Orthopedic surgeon: Dr. Noel Geroldonald F. West.  [facility]  The history is provided by the patient and medical records. No language interpreter was used.    Past Medical History  Diagnosis Date  . Hypertension    Past Surgical History  Procedure Laterality Date  . Replacement total knee    . Shoulder surgery    . Back surgery     No family history on file. History  Substance Use Topics  . Smoking status: Never Smoker   . Smokeless tobacco: Not on file  . Alcohol Use: No    Review of Systems  Constitutional: Negative for fever and chills.  Musculoskeletal: Positive for arthralgias and joint swelling.  Skin: Negative for color change, pallor and wound.  Neurological: Negative for numbness.      Allergies  Fentanyl  Home Medications   Current Outpatient Rx  Name  Route  Sig  Dispense  Refill  . allopurinol (ZYLOPRIM) 300 MG tablet   Oral   Take 300 mg by mouth daily.         [name]. amLODipine (NORVASC) 10 MG tablet   Oral   Take 10 mg by mouth daily.         . Coenzyme Q10 (COQ10 PO)   Oral   Take 1 tablet by mouth daily.          . diclofenac (VOLTAREN) 75 MG EC tablet   Oral   Take 75 mg by mouth 2 (two) times daily.         . mometasone (NASONEX) 50 MCG/ACT nasal spray   Nasal   Place 2 sprays into the nose daily.         . Multiple Vitamin (MULTIVITAMIN WITH MINERALS) TABS tablet   Oral   Take 1 tablet by mouth daily.         [name]. OVER THE COUNTER MEDICATION   Oral   Take 1 tablet by mouth daily. Butyrate         . OVER THE COUNTER MEDICATION   Oral   Take 15 mLs by mouth daily. Balance Oil         . OVER THE COUNTER MEDICATION   Oral   Take 52 g by mouth daily.         . propranolol ER (INDERAL LA) 80 MG 24 hr capsule   Oral   Take 80 mg by mouth daily.         . temazepam (RESTORIL) 15 MG capsule   Oral   Take 15 mg by mouth at bedtime as needed for sleep.         . traMADol (ULTRAM) 50 MG tablet  Oral   Take 50 mg by mouth every 6 (six) hours as needed for moderate pain.         [name] doxycycline (VIBRA-TABS) 100 MG tablet   Oral   Take 100 mg by mouth daily. pateitn          BP 134/92  Pulse 67  Temp(Src) 97.7 F (36.5 C) (Oral)  Resp 12  SpO2 99% Physical Exam  Nursing note and vitals reviewed. Constitutional: He is oriented to person, place, and time. He appears well-developed and well-nourished.  Appears uncomfortable  HENT:  Head: Normocephalic and atraumatic.  Neck: Neck supple.  Cardiovascular: Normal rate and regular rhythm.   Pulses:      Radial pulses are 2+ on the right side, and 2+ on the left side.  Pulmonary/Chest: Effort normal. No respiratory distress.  Musculoskeletal:       Left shoulder: He exhibits decreased range of motion, tenderness, deformity and spasm. He exhibits normal pulse.  Left shoulder with obvious deformity, head of humerus in an anterior dislocation position.   Neurological: He is oriented to person, place, and time.  Skin: Skin is warm and dry.  Psychiatric: He has a normal mood and affect. His behavior is normal.     ED Course  Procedures (including critical care time) Labs Review Labs Reviewed - No data to display Imaging Review  DG Shoulder Left Port (Final result)  Result time: 08/29/13 23:50:02    Final result by Rad Results In Interface (08/29/13 23:50:02)    Narrative:   CLINICAL DATA: Shoulder injury.  EXAM: PORTABLE LEFT SHOULDER - 2+ VIEW  COMPARISON: Radiography from today at 857 PM  FINDINGS: Relocated prosthetic glenohumeral joint. The humeral head is high-riding, commonly seen in total prostheses. There is no evidence of periprosthetic fracture.  IMPRESSION: Relocated glenohumeral prosthesis.   Electronically Signed By: Jorje Guild M.D. On: 08/29/2013 23:50             DG Shoulder Left (Final result)  Result time: 08/29/13 21:08:35    Final result by Rad Results In Interface (08/29/13 21:08:35)    Narrative:   CLINICAL DATA: Shoulder injury  EXAM: LEFT SHOULDER - 2+ VIEW  COMPARISON: 08/17/2013  FINDINGS: Total left glenohumeral arthroplasty. The arthroplasty is anterior and superiorly subluxed, with the prosthetic head just inferior to the coracoid. No periprosthetic fracture. No bone erosion.  IMPRESSION: Left glenohumeral arthroplasty is anteriorly dislocated, similar to 08/17/2013.   Electronically Signed By: Jorje Guild M.D. On: 08/29/2013 21:08      EKG Interpretation None      MDM   Final diagnoses:  Dislocation of left shoulder joint    Pt with a history of shoulder replacement presents with a second shoulder dislocation in less than two weeks. XR ordered to evaluated positioning and hardware.   XR shows:Left glenohumeral arthroplasty is anteriorly dislocated, similar to 08/17/2013. Discussed patient history, condition, and XR results with Dr. Rogene Houston, who agrees on conscious sedation and reduction. After reduction a sling was applied. Reduction XR obtained and show Relocated glenohumeral prosthesis. Discussed   imaging results, and treatment plan with the patient. He is not to remove the sling until he is cleared by an Orthopedic surgeon. Return precautions given. Reports understanding and no other concerns at this time.  Patient is stable for discharge at this time.  Meds given in ED:  Medications  HYDROmorphone (DILAUDID) injection 1 mg (1 mg Intravenous Given 08/29/13 2027)    Discharge Medication List as of 08/29/2013 11:59 PM  START taking these medications   Details  HYDROcodone-acetaminophen (NORCO/VICODIN) 5-325 MG per tablet Take 1 tablet by mouth every 4 (four) hours as needed., Starting 08/29/2013, Until Discontinued, Print    ibuprofen (ADVIL,MOTRIN) 800 MG tablet Take 1 tablet (800 mg total) by mouth 3 (three) times daily. Take with food, Starting 08/29/2013, Until Discontinued, Print            Lorrine Kin, PA-C 09/01/13 1711

## 2013-08-29 NOTE — ED Provider Notes (Signed)
Medical screening examination/treatment/procedure(s) were conducted as a shared visit with non-physician practitioner(s) and myself.  I personally evaluated the patient during the encounter.   EKG Interpretation None      Procedural sedation Performed by: Mervin Kung. Consent: Verbal consent obtained. Risks and benefits: risks, benefits and alternatives were discussed Required items: required blood products, implants, devices, and special equipment available Patient identity confirmed: arm band and provided demographic data Time out: Immediately prior to procedure a "time out" was called to verify the correct patient, procedure, equipment, support staff and site/side marked as required.  Sedation type: moderate (conscious) sedation NPO time confirmed and considedered  Sedatives: PROPOFOL  Physician Time at Bedside: 45 min  Vitals: Vital signs were monitored during sedation. Cardiac Monitor, pulse oximeter Patient tolerance: Patient tolerated the procedure well with no immediate complications. Comments: Pt with uneventful recovered. Returned to pre-procedural sedation baseline  Patient was on the 100% on room air oxygen vital signs remained normal sats always remained upper 90s. Patient tolerated procedure well. Essentially patient had 2 attempts at the left shoulder reduction so as to separate conscious sedation was. Patient required a total of 130 mg of propofol for each attempt. Put tolerated everything fine. So patient got a total of 260 mg. During the second attempt after the second sedation a reduction was successful.   Reduction of dislocation Date/Time: 11:08 PM Performed by: Mervin Kung. Authorized by: Mervin Kung. Consent: Verbal consent obtained. Risks and benefits: risks, benefits and alternatives were discussed Consent given by: patient Required items: required blood products, implants, devices, and special equipment available Time out: Immediately  prior to procedure a "time out" was called to verify the correct patient, procedure, equipment, support staff and site/side marked as required.  Patient sedated: Conscious sedation to propofol as stated above.  Vitals: Vital signs were monitored during sedation. Patient tolerance: Patient tolerated the procedure well with no immediate complications. Joint: Patient's left shoulder which has a total joint replacement had a superior anterior dislocation. Reduction technique: Joint was reduced with the countertraction abduction and some external rotation. It did pop back in place. Post reduction x-rays pending.    Patient status post shoulder replacement in February 2014 patient was seen here 2 weeks ago for a dislocation that happened with the no significant injury. At this location was an anterior superior dislocation. Today patient just essentially leaned on the table and it popped out again. No fall no significant injury. X-rays confirmed the day recurrent anterior superior dislocation. Patient's radial pulse was 2+ sensation was intact. Patient underwent consultation with propofol and reduction with reduction of the shoulder. Following reduction patients to have good sensation and radial pulse was 2+. Patient we placed a shoulder immobilizer and told not take it out but follows up with his orthopedic doctors. Obviously this comes out easily.     Mervin Kung, MD 08/29/13 [phone]

## 2013-08-29 NOTE — Discharge Instructions (Signed)
Call Dr. Heloise Beecham for further evaluation of your shoulder.   Wear your sling until you have been cleared to remove it by [facility] or another Orthopedic surgeon. Call for a follow up appointment with a Family or Primary Care Provider.  Return if Symptoms worsen.   Take medication as prescribed.  Ice you shoulder 3-4 times a day.

## 2013-08-30 NOTE — ED Notes (Signed)
Pt leaving with family. Verabalized understanding care of shoulder.

## 2014-03-16 ENCOUNTER — Encounter (HOSPITAL_BASED_OUTPATIENT_CLINIC_OR_DEPARTMENT_OTHER): Payer: Self-pay | Admitting: Emergency Medicine

## 2014-03-16 ENCOUNTER — Emergency Department (HOSPITAL_BASED_OUTPATIENT_CLINIC_OR_DEPARTMENT_OTHER): Payer: Medicare Other

## 2014-03-16 ENCOUNTER — Emergency Department (HOSPITAL_BASED_OUTPATIENT_CLINIC_OR_DEPARTMENT_OTHER)
Admission: EM | Admit: 2014-03-16 | Discharge: 2014-03-16 | Disposition: A | Discharge status: Home / Self Care | Payer: Medicare Other | Attending: Emergency Medicine | Admitting: Emergency Medicine

## 2014-03-16 DIAGNOSIS — Z791 Long term (current) use of non-steroidal anti-inflammatories (NSAID): Secondary | ICD-10-CM | POA: Diagnosis not present

## 2014-03-16 DIAGNOSIS — X503XXA Overexertion from repetitive movements, initial encounter: Secondary | ICD-10-CM | POA: Insufficient documentation

## 2014-03-16 DIAGNOSIS — S46909A Unspecified injury of unspecified muscle, fascia and tendon at shoulder and upper arm level, unspecified arm, initial encounter: Secondary | ICD-10-CM | POA: Diagnosis present

## 2014-03-16 DIAGNOSIS — Z79899 Other long term (current) drug therapy: Secondary | ICD-10-CM | POA: Insufficient documentation

## 2014-03-16 DIAGNOSIS — S43005A Unspecified dislocation of left shoulder joint, initial encounter: Secondary | ICD-10-CM

## 2014-03-16 DIAGNOSIS — IMO0002 Reserved for concepts with insufficient information to code with codable children: Secondary | ICD-10-CM | POA: Insufficient documentation

## 2014-03-16 DIAGNOSIS — Y9289 Other specified places as the place of occurrence of the external cause: Secondary | ICD-10-CM | POA: Diagnosis not present

## 2014-03-16 DIAGNOSIS — S4980XA Other specified injuries of shoulder and upper arm, unspecified arm, initial encounter: Secondary | ICD-10-CM | POA: Insufficient documentation

## 2014-03-16 DIAGNOSIS — X500XXA Overexertion from strenuous movement or load, initial encounter: Secondary | ICD-10-CM | POA: Diagnosis not present

## 2014-03-16 DIAGNOSIS — Z792 Long term (current) use of antibiotics: Secondary | ICD-10-CM | POA: Diagnosis not present

## 2014-03-16 DIAGNOSIS — I10 Essential (primary) hypertension: Secondary | ICD-10-CM | POA: Insufficient documentation

## 2014-03-16 DIAGNOSIS — Z96619 Presence of unspecified artificial shoulder joint: Secondary | ICD-10-CM | POA: Diagnosis not present

## 2014-03-16 DIAGNOSIS — Y9389 Activity, other specified: Secondary | ICD-10-CM | POA: Insufficient documentation

## 2014-03-16 DIAGNOSIS — Z96612 Presence of left artificial shoulder joint: Secondary | ICD-10-CM

## 2014-03-16 DIAGNOSIS — S43006A Unspecified dislocation of unspecified shoulder joint, initial encounter: Secondary | ICD-10-CM | POA: Diagnosis not present

## 2014-03-16 MED ORDER — HYDROMORPHONE HCL 1 MG/ML IJ SOLN
2.0000 mg | Freq: Once | INTRAMUSCULAR | Status: AC
  Administered 2014-03-16: 2 mg via INTRAMUSCULAR
  Filled 2014-03-16: qty 2

## 2014-03-16 MED ORDER — PROPOFOL 10 MG/ML IV BOLUS
0.5000 mg/kg | Freq: Once | INTRAVENOUS | Status: DC
Start: 2014-03-16 — End: 2014-03-16
  Filled 2014-03-16: qty 20

## 2014-03-16 MED ORDER — PROPOFOL 10 MG/ML IV BOLUS
INTRAVENOUS | Status: AC | PRN
  Administered 2014-03-16: 45.8 mg via INTRAVENOUS

## 2014-03-16 NOTE — ED Provider Notes (Signed)
CSN: [id number]     Arrival date & time 03/16/14  1546 History  This chart was scribed for Veryl Speak, MD by Ladene Artist, ED Scribe. The patient was seen in room MH07/MH07. Patient's care was started at 4:46 PM.    Chief Complaint  Patient presents with  . Shoulder Injury   Patient is a 58 y.o. male presenting with shoulder injury. The history is provided by the patient. No language interpreter was used.  Shoulder Injury This is a recurrent problem. The current episode started less than 1 hour ago. The problem has not changed since onset.  HPI Comments: Geoffrey West is a 58 y.o. male who presents to the Emergency Department complaining of L shoulder injury sustained PTA. Pt reports that he was working out when he felt his shoulder cramp before slipping. Pt has a h/o L shoulder dislocation and surgery. He reports associated shoulder pain that is similar to prior dislocations.    Past Medical History  Diagnosis Date  . Hypertension    Past Surgical History  Procedure Laterality Date  . Replacement total knee    . Shoulder surgery    . Back surgery     No family history on file. History  Substance Use Topics  . Smoking status: Never Smoker   . Smokeless tobacco: Not on file  . Alcohol Use: No    Review of Systems  Musculoskeletal: Positive for arthralgias.  All other systems reviewed and are negative.  Allergies  Fentanyl  Home Medications   Prior to Admission medications   Medication Sig Start Date End Date Taking? Authorizing Provider  allopurinol (ZYLOPRIM) 300 MG tablet Take 300 mg by mouth daily.    Historical Provider, MD  amLODipine (NORVASC) 10 MG tablet Take 10 mg by mouth daily.    Historical Provider, MD  Coenzyme Q10 (COQ10 PO) Take 1 tablet by mouth daily.    Historical Provider, MD  diclofenac (VOLTAREN) 75 MG EC tablet Take 75 mg by mouth 2 (two) times daily.    Historical Provider, MD  doxycycline (VIBRA-TABS) 100 MG tablet Take 100 mg by mouth  daily. pateitn    Historical Provider, MD  HYDROcodone-acetaminophen (NORCO/VICODIN) 5-325 MG per tablet Take 1 tablet by mouth every 4 (four) hours as needed. 08/29/13   Harvie Heck, PA-C  ibuprofen (ADVIL,MOTRIN) 800 MG tablet Take 1 tablet (800 mg total) by mouth 3 (three) times daily. Take with food 08/29/13   Harvie Heck, PA-C  mometasone (NASONEX) 50 MCG/ACT nasal spray Place 2 sprays into the nose daily.    Historical Provider, MD  Multiple Vitamin (MULTIVITAMIN WITH MINERALS) TABS tablet Take 1 tablet by mouth daily.    Historical Provider, MD  OVER THE COUNTER MEDICATION Take 1 tablet by mouth daily. Butyrate    Historical Provider, MD  OVER THE COUNTER MEDICATION Take 15 mLs by mouth daily. Balance Oil    Historical Provider, MD  OVER THE COUNTER MEDICATION Take 52 g by mouth daily.    Historical Provider, MD  propranolol ER (INDERAL LA) 80 MG 24 hr capsule Take 80 mg by mouth daily.    Historical Provider, MD  temazepam (RESTORIL) 15 MG capsule Take 15 mg by mouth at bedtime as needed for sleep.    Historical Provider, MD  traMADol (ULTRAM) 50 MG tablet Take 50 mg by mouth every 6 (six) hours as needed for moderate pain.    Historical Provider, MD   Triage Vitals: BP 147/92  Pulse 77  Temp(Src) 98.4  F (36.9 C) (Oral)  Resp 18  Ht 5\' 11"  (1.803 m)  Wt 202 lb (91.627 kg)  BMI 28.19 kg/m2  SpO2 98% Physical Exam  Nursing note and vitals reviewed. Constitutional: He is oriented to person, place, and time. He appears well-developed and well-nourished. No distress.  HENT:  Head: Normocephalic and atraumatic.  Eyes: Conjunctivae and EOM are normal.  Neck: Neck supple. No tracheal deviation present.  Cardiovascular: Normal rate.   Pulmonary/Chest: Effort normal. No respiratory distress.  Musculoskeletal: Normal range of motion.  L shoulder has apparent glenohumeral dislocation. Ulnar and radial pulses are palpable. Sensation intact to both sides of the hand. Can flex and extend all  fingers.    Neurological: He is alert and oriented to person, place, and time.  Skin: Skin is warm and dry.  Psychiatric: He has a normal mood and affect. His behavior is normal.   ED Course  Procedures (including critical care time) DIAGNOSTIC STUDIES: Oxygen Saturation is 98% on RA, normal by my interpretation.    COORDINATION OF CARE: 4:50 PM-Discussed treatment plan which includes XR with pt at bedside and pt agreed to plan.   Labs Review Labs Reviewed - No data to display  Imaging Review Dg Shoulder Left  03/16/2014   CLINICAL DATA:  History of dislocation and left shoulder surgery. Feels like shoulder is dislocated.  EXAM: LEFT SHOULDER - 2+ VIEW  COMPARISON:  08/29/2013  FINDINGS: Patient is status post left shoulder replacement. On the white view, the humeral head component appears to project anterior to the glenoid concerning for anterior dislocation. No fracture visualized.  IMPRESSION: Findings suspicious for anterior left shoulder dislocation.   Electronically Signed   By: Rolm Baptise M.D.   On: 03/16/2014 16:46   Dg Shoulder Left Port  03/16/2014   CLINICAL DATA:  Post reduction  EXAM: LEFT SHOULDER - 1 VIEW  COMPARISON:  Pre reduction study obtained earlier in the day  FINDINGS: On the frontal only view, no dislocation is seen. It must be cautioned that on the pre reduction study, the dislocation was much better appreciated on the Y scapular view. No fracture apparent.  IMPRESSION: No dislocation is appreciable on the frontal only view. It may be reasonable to obtain a Y scapular view to assess for change in alignment compared to pre reduction study given that the dislocation was much better seen on the Y scapular than frontal view on the pre reduction series.   Electronically Signed   By: Lowella Grip M.D.   On: 03/16/2014 18:31    EKG Interpretation None      MDM   Final diagnoses:  None    Patient with history of left shoulder replacement surgery. He presents  today with complaints of left shoulder pain that occurred suddenly while lifting weights. He is dislocated in the past and believes this is what he is done again. On exam, he has what appears to be an anterior dislocation this is confirmed with his plain films.  He was given IM Dilaudid and I attempted to reduce the shoulder unsuccessfully with traction countertraction. I then proceeded with conscious sedation using propofol. This resulted in a successful dislocation. He is neurovascularly intact pre-and post reduction. He will be placed in a sling and advised to followup with his orthopedic surgeon in [place]. He understands to return if he develops any problems.  I personally performed the services described in this documentation, which was scribed in my presence. The recorded information has been reviewed and  is accurate.     Veryl Speak, MD 03/16/14 [id number]

## 2014-03-16 NOTE — ED Notes (Signed)
MD Delo attempted to reduce shoulder, unsuccessful at this time.

## 2014-03-16 NOTE — Discharge Instructions (Signed)
Followup with your orthopedist as scheduled, and return to the ER if you develop any new and concerning symptoms.   Shoulder Dislocation Your shoulder is made up of three bones: the collar bone (clavicle); the shoulder blade (scapula), which includes the socket (glenoid cavity); and the upper arm bone (humerus). Your shoulder joint is the place where these bones meet. Strong, fibrous tissues hold these bones together (ligaments). Muscles and strong, fibrous tissues that connect the muscles to these bones (tendons) allow your arm to move through this joint. The range of motion of your shoulder joint is more extensive than most of your other joints, and the glenoid cavity is very shallow. That is the reason that your shoulder joint is one of the most unstable joints in your body. It is far more prone to dislocation than your other joints. Shoulder dislocation is when your humerus is forced out of your shoulder joint. CAUSES Shoulder dislocation is caused by a forceful impact on your shoulder. This impact usually is from an injury, such as a sports injury or a fall. SYMPTOMS Symptoms of shoulder dislocation include:  Deformity of your shoulder.  Intense pain.  Inability to move your shoulder joint.  Numbness, weakness, or tingling around your shoulder joint (your neck or down your arm).  Bruising or swelling around your shoulder. DIAGNOSIS In order to diagnose a dislocated shoulder, your caregiver will perform a physical exam. Your caregiver also may have an X-ray exam done to see if you have any broken bones. Magnetic resonance imaging (MRI) is a procedure that sometimes is done to help your caregiver see any damage to the soft tissues around your shoulder, particularly your rotator cuff tendons. Additionally, your caregiver also may have electromyography done to measure the electrical discharges produced in your muscles if you have signs or symptoms of nerve damage. TREATMENT A shoulder  dislocation is treated by placing the humerus back in the joint (reduction). Your caregiver does this either manually (closed reduction), by moving your humerus back into the joint through manipulation, or through surgery (open reduction). When your humerus is back in place, severe pain should improve almost immediately. You also may need to have surgery if you have a weak shoulder joint or ligaments, and you have recurring shoulder dislocations, despite rehabilitation. In rare cases, surgery is necessary if your nerves or blood vessels are damaged during the dislocation. After your reduction, your arm will be placed in a shoulder immobilizer or sling to keep it from moving. Your caregiver will have you wear your shoulder immobilizer or sling for 3 days to 3 weeks, depending on how serious your dislocation is. When your shoulder immobilizer or sling is removed, your caregiver may prescribe physical therapy to help improve the range of motion in your shoulder joint. HOME CARE INSTRUCTIONS  The following measures can help to reduce pain and speed up the healing process:  Rest your injured joint. Do not move it. Avoid activities similar to the one that caused your injury.  Apply ice to your injured joint for the first day or two after your reduction or as directed by your caregiver. Applying ice helps to reduce inflammation and pain.  Put ice in a plastic bag.  Place a towel between your skin and the bag.  Leave the ice on for 15-20 minutes at a time, every 2 hours while you are awake.  Exercise your hand by squeezing a soft ball. This helps to eliminate stiffness and swelling in your hand and wrist.  Take  over-the-counter or prescription medicine for pain or discomfort as told by your caregiver. SEEK IMMEDIATE MEDICAL CARE IF:   Your shoulder immobilizer or sling becomes damaged.  Your pain becomes worse rather than better.  You lose feeling in your arm or hand, or they become white and  cold. MAKE SURE YOU:   Understand these instructions.  Will watch your condition.  Will get help right away if you are not doing well or get worse. Document Released: 03/07/2001 Document Revised: 10/27/2013 Document Reviewed: 04/02/2011 [facility] Patient Information 2015 [place], [place]. This information is not intended to replace advice given to you by your health care provider. Make sure you discuss any questions you have with your health care provider.

## 2014-03-16 NOTE — ED Notes (Addendum)
Pt with hx dislocation and surgery to left shoulder-today feels like it is out again during exercise

## 2014-03-16 NOTE — Sedation Documentation (Signed)
Post procedure xray done.

## 2014-03-16 NOTE — ED Notes (Signed)
MD at bedside. 

## 2014-03-16 NOTE — Sedation Documentation (Signed)
Pt alert, following commands and answering questions appropriately. Denies pain.

## 2015-07-26 DIAGNOSIS — M109 Gout, unspecified: Secondary | ICD-10-CM | POA: Diagnosis not present

## 2015-07-26 DIAGNOSIS — I10 Essential (primary) hypertension: Secondary | ICD-10-CM | POA: Diagnosis not present

## 2015-07-26 DIAGNOSIS — E782 Mixed hyperlipidemia: Secondary | ICD-10-CM | POA: Diagnosis not present

## 2015-07-26 DIAGNOSIS — J309 Allergic rhinitis, unspecified: Secondary | ICD-10-CM | POA: Diagnosis not present

## 2015-07-26 DIAGNOSIS — E291 Testicular hypofunction: Secondary | ICD-10-CM | POA: Diagnosis not present

## 2015-07-26 DIAGNOSIS — E039 Hypothyroidism, unspecified: Secondary | ICD-10-CM | POA: Diagnosis not present

## 2015-07-30 DIAGNOSIS — E039 Hypothyroidism, unspecified: Secondary | ICD-10-CM | POA: Diagnosis not present

## 2015-08-13 DIAGNOSIS — J329 Chronic sinusitis, unspecified: Secondary | ICD-10-CM | POA: Diagnosis not present

## 2015-08-13 DIAGNOSIS — J209 Acute bronchitis, unspecified: Secondary | ICD-10-CM | POA: Diagnosis not present

## 2015-09-02 DIAGNOSIS — E039 Hypothyroidism, unspecified: Secondary | ICD-10-CM | POA: Diagnosis not present

## 2015-10-15 DIAGNOSIS — E291 Testicular hypofunction: Secondary | ICD-10-CM | POA: Diagnosis not present

## 2015-10-20 DIAGNOSIS — E291 Testicular hypofunction: Secondary | ICD-10-CM | POA: Diagnosis not present

## 2015-10-27 DIAGNOSIS — E291 Testicular hypofunction: Secondary | ICD-10-CM | POA: Diagnosis not present

## 2015-11-03 DIAGNOSIS — E291 Testicular hypofunction: Secondary | ICD-10-CM | POA: Diagnosis not present

## 2015-11-04 DIAGNOSIS — M109 Gout, unspecified: Secondary | ICD-10-CM | POA: Diagnosis not present

## 2015-11-04 DIAGNOSIS — J309 Allergic rhinitis, unspecified: Secondary | ICD-10-CM | POA: Diagnosis not present

## 2015-11-04 DIAGNOSIS — I10 Essential (primary) hypertension: Secondary | ICD-10-CM | POA: Diagnosis not present

## 2015-11-10 DIAGNOSIS — E291 Testicular hypofunction: Secondary | ICD-10-CM | POA: Diagnosis not present

## 2015-11-17 DIAGNOSIS — E291 Testicular hypofunction: Secondary | ICD-10-CM | POA: Diagnosis not present

## 2015-11-19 DIAGNOSIS — E291 Testicular hypofunction: Secondary | ICD-10-CM | POA: Diagnosis not present

## 2015-11-24 DIAGNOSIS — E291 Testicular hypofunction: Secondary | ICD-10-CM | POA: Diagnosis not present

## 2015-11-25 DIAGNOSIS — E039 Hypothyroidism, unspecified: Secondary | ICD-10-CM | POA: Diagnosis not present

## 2015-12-01 DIAGNOSIS — E291 Testicular hypofunction: Secondary | ICD-10-CM | POA: Diagnosis not present

## 2015-12-08 DIAGNOSIS — E291 Testicular hypofunction: Secondary | ICD-10-CM | POA: Diagnosis not present

## 2015-12-15 DIAGNOSIS — E291 Testicular hypofunction: Secondary | ICD-10-CM | POA: Diagnosis not present

## 2015-12-22 DIAGNOSIS — E291 Testicular hypofunction: Secondary | ICD-10-CM | POA: Diagnosis not present

## 2015-12-29 DIAGNOSIS — E291 Testicular hypofunction: Secondary | ICD-10-CM | POA: Diagnosis not present

## 2016-01-05 DIAGNOSIS — E291 Testicular hypofunction: Secondary | ICD-10-CM | POA: Diagnosis not present

## 2016-01-12 DIAGNOSIS — E291 Testicular hypofunction: Secondary | ICD-10-CM | POA: Diagnosis not present

## 2016-01-19 DIAGNOSIS — E291 Testicular hypofunction: Secondary | ICD-10-CM | POA: Diagnosis not present

## 2016-01-26 DIAGNOSIS — E291 Testicular hypofunction: Secondary | ICD-10-CM | POA: Diagnosis not present

## 2016-02-02 DIAGNOSIS — E291 Testicular hypofunction: Secondary | ICD-10-CM | POA: Diagnosis not present

## 2016-02-09 DIAGNOSIS — E291 Testicular hypofunction: Secondary | ICD-10-CM | POA: Diagnosis not present

## 2016-02-16 DIAGNOSIS — E291 Testicular hypofunction: Secondary | ICD-10-CM | POA: Diagnosis not present

## 2016-02-22 DIAGNOSIS — M5136 Other intervertebral disc degeneration, lumbar region: Secondary | ICD-10-CM | POA: Diagnosis not present

## 2016-02-22 DIAGNOSIS — Z6829 Body mass index (BMI) 29.0-29.9, adult: Secondary | ICD-10-CM | POA: Diagnosis not present

## 2016-02-23 DIAGNOSIS — E291 Testicular hypofunction: Secondary | ICD-10-CM | POA: Diagnosis not present

## 2016-03-01 DIAGNOSIS — E291 Testicular hypofunction: Secondary | ICD-10-CM | POA: Diagnosis not present

## 2016-03-04 ENCOUNTER — Other Ambulatory Visit: Payer: Self-pay | Admitting: Neurological Surgery

## 2016-03-04 DIAGNOSIS — M5136 Other intervertebral disc degeneration, lumbar region: Secondary | ICD-10-CM

## 2016-03-08 DIAGNOSIS — E291 Testicular hypofunction: Secondary | ICD-10-CM | POA: Diagnosis not present

## 2016-03-11 ENCOUNTER — Ambulatory Visit
Admission: RE | Admit: 2016-03-11 | Discharge: 2016-03-11 | Disposition: A | Discharge status: Home / Self Care | Payer: PPO | Source: Ambulatory Visit | Attending: Neurological Surgery | Admitting: Neurological Surgery

## 2016-03-11 DIAGNOSIS — M5136 Other intervertebral disc degeneration, lumbar region: Secondary | ICD-10-CM

## 2016-03-11 DIAGNOSIS — M5126 Other intervertebral disc displacement, lumbar region: Secondary | ICD-10-CM | POA: Diagnosis not present

## 2016-03-15 DIAGNOSIS — E291 Testicular hypofunction: Secondary | ICD-10-CM | POA: Diagnosis not present

## 2016-03-22 DIAGNOSIS — E291 Testicular hypofunction: Secondary | ICD-10-CM | POA: Diagnosis not present

## 2016-03-28 DIAGNOSIS — Z6829 Body mass index (BMI) 29.0-29.9, adult: Secondary | ICD-10-CM | POA: Diagnosis not present

## 2016-03-28 DIAGNOSIS — M545 Low back pain: Secondary | ICD-10-CM | POA: Diagnosis not present

## 2016-03-29 DIAGNOSIS — E291 Testicular hypofunction: Secondary | ICD-10-CM | POA: Diagnosis not present

## 2016-04-05 DIAGNOSIS — E291 Testicular hypofunction: Secondary | ICD-10-CM | POA: Diagnosis not present

## 2016-04-12 DIAGNOSIS — E291 Testicular hypofunction: Secondary | ICD-10-CM | POA: Diagnosis not present

## 2016-04-12 DIAGNOSIS — H04123 Dry eye syndrome of bilateral lacrimal glands: Secondary | ICD-10-CM | POA: Diagnosis not present

## 2016-04-12 DIAGNOSIS — H5201 Hypermetropia, right eye: Secondary | ICD-10-CM | POA: Diagnosis not present

## 2016-04-12 DIAGNOSIS — H1789 Other corneal scars and opacities: Secondary | ICD-10-CM | POA: Diagnosis not present

## 2016-04-12 DIAGNOSIS — H52223 Regular astigmatism, bilateral: Secondary | ICD-10-CM | POA: Diagnosis not present

## 2016-04-12 DIAGNOSIS — H5212 Myopia, left eye: Secondary | ICD-10-CM | POA: Diagnosis not present

## 2016-04-19 DIAGNOSIS — E291 Testicular hypofunction: Secondary | ICD-10-CM | POA: Diagnosis not present

## 2016-04-21 DIAGNOSIS — E291 Testicular hypofunction: Secondary | ICD-10-CM | POA: Diagnosis not present

## 2016-04-26 DIAGNOSIS — E291 Testicular hypofunction: Secondary | ICD-10-CM | POA: Diagnosis not present

## 2016-05-03 DIAGNOSIS — E291 Testicular hypofunction: Secondary | ICD-10-CM | POA: Diagnosis not present

## 2016-05-08 DIAGNOSIS — L03012 Cellulitis of left finger: Secondary | ICD-10-CM | POA: Diagnosis not present

## 2016-05-10 DIAGNOSIS — E291 Testicular hypofunction: Secondary | ICD-10-CM | POA: Diagnosis not present

## 2016-05-17 DIAGNOSIS — E291 Testicular hypofunction: Secondary | ICD-10-CM | POA: Diagnosis not present

## 2016-05-17 DIAGNOSIS — M545 Low back pain: Secondary | ICD-10-CM | POA: Diagnosis not present

## 2016-05-17 DIAGNOSIS — L03012 Cellulitis of left finger: Secondary | ICD-10-CM | POA: Diagnosis not present

## 2016-05-24 DIAGNOSIS — D751 Secondary polycythemia: Secondary | ICD-10-CM | POA: Diagnosis not present

## 2016-05-24 DIAGNOSIS — E291 Testicular hypofunction: Secondary | ICD-10-CM | POA: Diagnosis not present

## 2016-05-29 DIAGNOSIS — L989 Disorder of the skin and subcutaneous tissue, unspecified: Secondary | ICD-10-CM | POA: Diagnosis not present

## 2016-05-31 DIAGNOSIS — E291 Testicular hypofunction: Secondary | ICD-10-CM | POA: Diagnosis not present

## 2016-06-01 DIAGNOSIS — R2232 Localized swelling, mass and lump, left upper limb: Secondary | ICD-10-CM | POA: Diagnosis not present

## 2016-06-01 DIAGNOSIS — L03012 Cellulitis of left finger: Secondary | ICD-10-CM | POA: Diagnosis not present

## 2016-06-07 DIAGNOSIS — E291 Testicular hypofunction: Secondary | ICD-10-CM | POA: Diagnosis not present

## 2016-06-12 DIAGNOSIS — R159 Full incontinence of feces: Secondary | ICD-10-CM | POA: Diagnosis not present

## 2016-06-14 DIAGNOSIS — E291 Testicular hypofunction: Secondary | ICD-10-CM | POA: Diagnosis not present

## 2016-06-15 DIAGNOSIS — R159 Full incontinence of feces: Secondary | ICD-10-CM | POA: Diagnosis not present

## 2016-06-15 DIAGNOSIS — R194 Change in bowel habit: Secondary | ICD-10-CM | POA: Diagnosis not present

## 2016-06-21 DIAGNOSIS — E291 Testicular hypofunction: Secondary | ICD-10-CM | POA: Diagnosis not present

## 2016-06-28 DIAGNOSIS — E291 Testicular hypofunction: Secondary | ICD-10-CM | POA: Diagnosis not present

## 2016-07-05 DIAGNOSIS — E291 Testicular hypofunction: Secondary | ICD-10-CM | POA: Diagnosis not present

## 2016-07-07 DIAGNOSIS — R159 Full incontinence of feces: Secondary | ICD-10-CM | POA: Diagnosis not present

## 2016-07-07 DIAGNOSIS — K5730 Diverticulosis of large intestine without perforation or abscess without bleeding: Secondary | ICD-10-CM | POA: Diagnosis not present

## 2016-07-07 DIAGNOSIS — K641 Second degree hemorrhoids: Secondary | ICD-10-CM | POA: Diagnosis not present

## 2016-07-07 DIAGNOSIS — R194 Change in bowel habit: Secondary | ICD-10-CM | POA: Diagnosis not present

## 2016-07-19 DIAGNOSIS — J069 Acute upper respiratory infection, unspecified: Secondary | ICD-10-CM | POA: Diagnosis not present

## 2016-07-19 DIAGNOSIS — J101 Influenza due to other identified influenza virus with other respiratory manifestations: Secondary | ICD-10-CM | POA: Diagnosis not present

## 2016-07-19 DIAGNOSIS — E291 Testicular hypofunction: Secondary | ICD-10-CM | POA: Diagnosis not present

## 2016-07-19 DIAGNOSIS — R6889 Other general symptoms and signs: Secondary | ICD-10-CM | POA: Diagnosis not present

## 2016-07-26 DIAGNOSIS — E291 Testicular hypofunction: Secondary | ICD-10-CM | POA: Diagnosis not present

## 2016-07-28 DIAGNOSIS — E291 Testicular hypofunction: Secondary | ICD-10-CM | POA: Diagnosis not present

## 2016-08-02 DIAGNOSIS — E291 Testicular hypofunction: Secondary | ICD-10-CM | POA: Diagnosis not present

## 2016-08-09 DIAGNOSIS — E291 Testicular hypofunction: Secondary | ICD-10-CM | POA: Diagnosis not present

## 2016-09-28 DIAGNOSIS — M2550 Pain in unspecified joint: Secondary | ICD-10-CM | POA: Diagnosis not present

## 2016-09-28 DIAGNOSIS — R5383 Other fatigue: Secondary | ICD-10-CM | POA: Diagnosis not present

## 2016-09-28 DIAGNOSIS — E669 Obesity, unspecified: Secondary | ICD-10-CM | POA: Diagnosis not present

## 2016-09-28 DIAGNOSIS — M150 Primary generalized (osteo)arthritis: Secondary | ICD-10-CM | POA: Diagnosis not present

## 2016-09-28 DIAGNOSIS — Z6830 Body mass index (BMI) 30.0-30.9, adult: Secondary | ICD-10-CM | POA: Diagnosis not present

## 2016-10-05 ENCOUNTER — Emergency Department (HOSPITAL_BASED_OUTPATIENT_CLINIC_OR_DEPARTMENT_OTHER): Payer: PPO

## 2016-10-05 ENCOUNTER — Encounter (HOSPITAL_BASED_OUTPATIENT_CLINIC_OR_DEPARTMENT_OTHER): Payer: Self-pay | Admitting: *Deleted

## 2016-10-05 ENCOUNTER — Emergency Department (HOSPITAL_BASED_OUTPATIENT_CLINIC_OR_DEPARTMENT_OTHER)
Admission: EM | Admit: 2016-10-05 | Discharge: 2016-10-05 | Disposition: A | Discharge status: Home / Self Care | Payer: PPO | Attending: Emergency Medicine | Admitting: Emergency Medicine

## 2016-10-05 DIAGNOSIS — Y999 Unspecified external cause status: Secondary | ICD-10-CM | POA: Diagnosis not present

## 2016-10-05 DIAGNOSIS — Y93B3 Activity, free weights: Secondary | ICD-10-CM | POA: Diagnosis not present

## 2016-10-05 DIAGNOSIS — I10 Essential (primary) hypertension: Secondary | ICD-10-CM | POA: Insufficient documentation

## 2016-10-05 DIAGNOSIS — Y9289 Other specified places as the place of occurrence of the external cause: Secondary | ICD-10-CM | POA: Diagnosis not present

## 2016-10-05 DIAGNOSIS — M25512 Pain in left shoulder: Secondary | ICD-10-CM | POA: Diagnosis not present

## 2016-10-05 DIAGNOSIS — M62838 Other muscle spasm: Secondary | ICD-10-CM | POA: Diagnosis not present

## 2016-10-05 DIAGNOSIS — S4992XA Unspecified injury of left shoulder and upper arm, initial encounter: Secondary | ICD-10-CM | POA: Diagnosis not present

## 2016-10-05 DIAGNOSIS — S43005A Unspecified dislocation of left shoulder joint, initial encounter: Secondary | ICD-10-CM | POA: Diagnosis not present

## 2016-10-05 DIAGNOSIS — Y9389 Activity, other specified: Secondary | ICD-10-CM | POA: Diagnosis not present

## 2016-10-05 DIAGNOSIS — X58XXXA Exposure to other specified factors, initial encounter: Secondary | ICD-10-CM | POA: Insufficient documentation

## 2016-10-05 MED ORDER — MORPHINE SULFATE (PF) 4 MG/ML IV SOLN
4.0000 mg | Freq: Once | INTRAVENOUS | Status: AC
  Administered 2016-10-05: 4 mg via INTRAMUSCULAR

## 2016-10-05 MED ORDER — KETOROLAC TROMETHAMINE 60 MG/2ML IM SOLN
30.0000 mg | Freq: Once | INTRAMUSCULAR | Status: DC
  Filled 2016-10-05: qty 2

## 2016-10-05 MED ORDER — MORPHINE SULFATE (PF) 4 MG/ML IV SOLN
4.0000 mg | Freq: Once | INTRAVENOUS | Status: DC
  Filled 2016-10-05: qty 1

## 2016-10-05 NOTE — ED Triage Notes (Signed)
Pt c/o left shoulder injury while at gym x 2 hrs ago

## 2016-10-05 NOTE — ED Provider Notes (Signed)
[facility] Provider Note   CSN: [id number] Arrival date & time: 10/05/16  1929  By signing my name below, I, Hansel Feinstein, attest that this documentation has been prepared under the direction and in the presence of Fatima Blank, MD. Electronically Signed: Hansel Feinstein, ED Scribe. 10/05/16. 8:41 PM.     History   Chief Complaint Chief Complaint  Patient presents with  . Shoulder Injury    HPI Geoffrey West is a 61 y.o. male who presents to the Emergency Department complaining of moderate, constant left shoulder pain that began this afternoon. Pt states he was squatting with weights with his shoulders abducted when his pain began. He denies fall, LOC or head injury. He reports h/o b/l shoulder replacement and multiple shoulder dislocations. Pt reports his pain is similar to prior shoulder dislocations. He states his pain is worsened with shoulder ROM. He denies additional injuries.   The history is provided by the patient. No language interpreter was used.    Past Medical History:  Diagnosis Date  . Hypertension     There are no active problems to display for this patient.   Past Surgical History:  Procedure Laterality Date  . BACK SURGERY    . REPLACEMENT TOTAL KNEE    . SHOULDER SURGERY         Home Medications    Prior to Admission medications   Medication Sig Start Date End Date Taking? Authorizing Provider  allopurinol (ZYLOPRIM) 300 MG tablet Take 300 mg by mouth daily.    Historical Provider, MD  amLODipine (NORVASC) 10 MG tablet Take 10 mg by mouth daily.    Historical Provider, MD  Coenzyme Q10 (COQ10 PO) Take 1 tablet by mouth daily.    Historical Provider, MD  diclofenac (VOLTAREN) 75 MG EC tablet Take 75 mg by mouth 2 (two) times daily.    Historical Provider, MD  doxycycline (VIBRA-TABS) 100 MG tablet Take 100 mg by mouth daily. pateitn    Historical Provider, MD  HYDROcodone-acetaminophen (NORCO/VICODIN) 5-325 MG per tablet Take 1  tablet by mouth every 4 (four) hours as needed. 08/29/13   Harvie Heck, PA-C  ibuprofen (ADVIL,MOTRIN) 800 MG tablet Take 1 tablet (800 mg total) by mouth 3 (three) times daily. Take with food 08/29/13   Harvie Heck, PA-C  mometasone (NASONEX) 50 MCG/ACT nasal spray Place 2 sprays into the nose daily.    Historical Provider, MD  Multiple Vitamin (MULTIVITAMIN WITH MINERALS) TABS tablet Take 1 tablet by mouth daily.    Historical Provider, MD  OVER THE COUNTER MEDICATION Take 1 tablet by mouth daily. Butyrate    Historical Provider, MD  OVER THE COUNTER MEDICATION Take 15 mLs by mouth daily. Balance Oil    Historical Provider, MD  OVER THE COUNTER MEDICATION Take 52 g by mouth daily.    Historical Provider, MD  propranolol ER (INDERAL LA) 80 MG 24 hr capsule Take 80 mg by mouth daily.    Historical Provider, MD  temazepam (RESTORIL) 15 MG capsule Take 15 mg by mouth at bedtime as needed for sleep.    Historical Provider, MD  traMADol (ULTRAM) 50 MG tablet Take 50 mg by mouth every 6 (six) hours as needed for moderate pain.    Historical Provider, MD    Family History History reviewed. No pertinent family history.  Social History Social History  Substance Use Topics  . Smoking status: Never Smoker  . Smokeless tobacco: Not on file  . Alcohol use No  Allergies   Fentanyl   Review of Systems Review of Systems  Musculoskeletal: Positive for arthralgias.  Neurological: Negative for syncope.   Physical Exam Updated Vital Signs BP (!) 156/100   Pulse 95   Temp 98.3 F (36.8 C)   Resp 16   Ht 5\' 11"  (1.803 m)   Wt 210 lb (95.3 kg)   SpO2 96%   BMI 29.29 kg/m   Physical Exam  Constitutional: He is oriented to person, place, and time. He appears well-developed and well-nourished. No distress.  HENT:  Head: Normocephalic and atraumatic.  Right Ear: External ear normal.  Left Ear: External ear normal.  Nose: Nose normal.  Mouth/Throat: Mucous membranes are normal. No  trismus in the jaw.  Eyes: Conjunctivae and EOM are normal. No scleral icterus.  Neck: Normal range of motion and phonation normal.  Cardiovascular: Normal rate and regular rhythm.   Pulmonary/Chest: Effort normal. No stridor. No respiratory distress.  Abdominal: He exhibits no distension.  Musculoskeletal: He exhibits no edema.       Left shoulder: He exhibits decreased range of motion, tenderness and spasm (Notable muscle spasm of the left deltoid). He exhibits no bony tenderness and normal pulse.  Neurological: He is alert and oriented to person, place, and time.  Skin: He is not diaphoretic.  Psychiatric: He has a normal mood and affect. His behavior is normal.  Vitals reviewed.    ED Treatments / Results   DIAGNOSTIC STUDIES: Oxygen Saturation is 96% on RA, adequate by my interpretation.    COORDINATION OF CARE: 8:38 PM Discussed treatment plan with pt at bedside which includes XR and pt agreed to plan.    Labs (all labs ordered are listed, but only abnormal results are displayed) Labs Reviewed - No data to display  EKG  EKG Interpretation None       Radiology Dg Shoulder Left  Result Date: 10/05/2016 CLINICAL DATA:  Injured shoulder at the gym.  Left shoulder pain. EXAM: LEFT SHOULDER - 2+ VIEW COMPARISON:  03/16/2014. FINDINGS: The humeral prosthesis is intact. There appear to be progressive degenerative changes involving the glenoid with spurring changes and areas of probable subchondral cystic change. The glenoid implant is difficult to evaluate. The [facility] joint is intact. The left ribs appear normal. IMPRESSION: No acute bony findings.  The humeral prosthesis is intact. Progressive appearing degenerative changes involving the glenoid. Electronically Signed   By: Marijo Sanes M.D.   On: 10/05/2016 20:24    Procedures Procedures (including critical care time)  Medications Ordered in ED Medications  ketorolac (TORADOL) injection 30 mg (30 mg Intramuscular Not Given  10/05/16 2010)  morphine 4 MG/ML injection 4 mg (not administered)     Initial Impression / Assessment and Plan / ED Course  I have reviewed the triage vital signs and the nursing notes.  Pertinent labs & imaging results that were available during my care of the patient were reviewed by me and considered in my medical decision making (see chart for details).     Plain film without evidence of dislocation or fracture. Likely secondary to muscle spasm. Patient provided with additional IM A medicine. Patient already on muscle relaxers. Declined a sling. Instructed to follow-up with his orthopedic surgeon as needed.  The patient is safe for discharge with strict return precautions.   Final Clinical Impressions(s) / ED Diagnoses   Final diagnoses:  Acute pain of left shoulder  Muscle spasm   Disposition: Discharge  Condition: Good  I have discussed the  results, Dx and Tx plan with the patient who expressed understanding and agree(s) with the plan. Discharge instructions discussed at great length. The patient was given strict return precautions who verbalized understanding of the instructions. No further questions at time of discharge.    New Prescriptions   No medications on file    Follow Up: Orpah Melter, MD [address] [place] [address] [phone]  Schedule an appointment as soon as possible for a visit  As needed  Orthopedic surgery  Schedule an appointment as soon as possible for a visit     I personally performed the services described in this documentation, which was scribed in my presence. The recorded information has been reviewed and is accurate.        Fatima Blank, MD 10/05/16 2053

## 2016-10-05 NOTE — ED Notes (Addendum)
inj to left shoulder while at gym,  Hx of replacement x 2  No deformity noted

## 2016-10-20 DIAGNOSIS — M25319 Other instability, unspecified shoulder: Secondary | ICD-10-CM | POA: Diagnosis not present

## 2016-10-20 DIAGNOSIS — M25512 Pain in left shoulder: Secondary | ICD-10-CM | POA: Diagnosis not present

## 2016-10-26 DIAGNOSIS — M2550 Pain in unspecified joint: Secondary | ICD-10-CM | POA: Diagnosis not present

## 2016-10-26 DIAGNOSIS — E663 Overweight: Secondary | ICD-10-CM | POA: Diagnosis not present

## 2016-10-26 DIAGNOSIS — M150 Primary generalized (osteo)arthritis: Secondary | ICD-10-CM | POA: Diagnosis not present

## 2016-10-26 DIAGNOSIS — Z6829 Body mass index (BMI) 29.0-29.9, adult: Secondary | ICD-10-CM | POA: Diagnosis not present

## 2016-10-27 DIAGNOSIS — M25312 Other instability, left shoulder: Secondary | ICD-10-CM | POA: Diagnosis not present

## 2016-10-27 DIAGNOSIS — Z96612 Presence of left artificial shoulder joint: Secondary | ICD-10-CM | POA: Diagnosis not present

## 2016-11-02 ENCOUNTER — Other Ambulatory Visit: Payer: Self-pay | Admitting: Orthopedic Surgery

## 2016-11-02 DIAGNOSIS — Z96612 Presence of left artificial shoulder joint: Secondary | ICD-10-CM

## 2016-11-03 ENCOUNTER — Ambulatory Visit
Admission: RE | Admit: 2016-11-03 | Discharge: 2016-11-03 | Disposition: A | Discharge status: Home / Self Care | Payer: PPO | Source: Ambulatory Visit | Attending: Orthopedic Surgery | Admitting: Orthopedic Surgery

## 2016-11-03 DIAGNOSIS — Z96612 Presence of left artificial shoulder joint: Secondary | ICD-10-CM

## 2016-11-03 DIAGNOSIS — M25512 Pain in left shoulder: Secondary | ICD-10-CM | POA: Diagnosis not present

## 2016-11-10 DIAGNOSIS — M25312 Other instability, left shoulder: Secondary | ICD-10-CM | POA: Diagnosis not present

## 2016-11-10 DIAGNOSIS — Z96612 Presence of left artificial shoulder joint: Secondary | ICD-10-CM | POA: Diagnosis not present

## 2016-12-05 ENCOUNTER — Encounter (HOSPITAL_COMMUNITY)
Admission: RE | Admit: 2016-12-05 | Discharge: 2016-12-05 | Disposition: A | Discharge status: Home / Self Care | Payer: PPO | Source: Ambulatory Visit | Attending: Orthopedic Surgery | Admitting: Orthopedic Surgery

## 2016-12-05 ENCOUNTER — Encounter (HOSPITAL_COMMUNITY): Payer: Self-pay

## 2016-12-05 DIAGNOSIS — Z79899 Other long term (current) drug therapy: Secondary | ICD-10-CM | POA: Diagnosis not present

## 2016-12-05 DIAGNOSIS — Z01810 Encounter for preprocedural cardiovascular examination: Secondary | ICD-10-CM | POA: Insufficient documentation

## 2016-12-05 DIAGNOSIS — Z01812 Encounter for preprocedural laboratory examination: Secondary | ICD-10-CM | POA: Insufficient documentation

## 2016-12-05 DIAGNOSIS — K219 Gastro-esophageal reflux disease without esophagitis: Secondary | ICD-10-CM | POA: Insufficient documentation

## 2016-12-05 DIAGNOSIS — T84028A Dislocation of other internal joint prosthesis, initial encounter: Secondary | ICD-10-CM | POA: Diagnosis not present

## 2016-12-05 DIAGNOSIS — Z96653 Presence of artificial knee joint, bilateral: Secondary | ICD-10-CM | POA: Diagnosis not present

## 2016-12-05 DIAGNOSIS — M25512 Pain in left shoulder: Secondary | ICD-10-CM | POA: Diagnosis not present

## 2016-12-05 DIAGNOSIS — I10 Essential (primary) hypertension: Secondary | ICD-10-CM

## 2016-12-05 DIAGNOSIS — Y838 Other surgical procedures as the cause of abnormal reaction of the patient, or of later complication, without mention of misadventure at the time of the procedure: Secondary | ICD-10-CM | POA: Diagnosis not present

## 2016-12-05 HISTORY — DX: Unspecified osteoarthritis, unspecified site: M19.90

## 2016-12-05 HISTORY — DX: Gastro-esophageal reflux disease without esophagitis: K21.9

## 2016-12-05 LAB — BASIC METABOLIC PANEL
ANION GAP: 8 (ref 5–15)
BUN: 16 mg/dL (ref 6–20)
CALCIUM: 9.7 mg/dL (ref 8.9–10.3)
CO2: 29 mmol/L (ref 22–32)
Chloride: 101 mmol/L (ref 101–111)
Creatinine, Ser: 1.21 mg/dL (ref 0.61–1.24)
GLUCOSE: 90 mg/dL (ref 65–99)
Potassium: 3.8 mmol/L (ref 3.5–5.1)
SODIUM: 138 mmol/L (ref 135–145)

## 2016-12-05 LAB — ABO/RH: ABO/RH(D): A POS

## 2016-12-05 LAB — CBC
HCT: 51.6 % (ref 39.0–52.0)
HEMOGLOBIN: 18.3 g/dL — AB (ref 13.0–17.0)
MCH: 32.9 pg (ref 26.0–34.0)
MCHC: 35.5 g/dL (ref 30.0–36.0)
MCV: 92.8 fL (ref 78.0–100.0)
Platelets: 157 10*3/uL (ref 150–400)
RBC: 5.56 MIL/uL (ref 4.22–5.81)
RDW: 12.7 % (ref 11.5–15.5)
WBC: 6.9 10*3/uL (ref 4.0–10.5)

## 2016-12-05 LAB — TYPE AND SCREEN
ABO/RH(D): A POS
ANTIBODY SCREEN: NEGATIVE

## 2016-12-05 NOTE — Pre-Procedure Instructions (Signed)
Olivia Royse [facility]  12/05/2016      [id number] - [place], [place] - [address] [place]  [address] Phone: [phone] Fax: [phone]  [id number] - [place], [place] - [address] [place] [address] Phone: [phone] Fax: [phone]    Your procedure is scheduled on June 15.  Report to [facility] Admitting at [phone].  Call this number if you have problems the morning of surgery:  [phone]   Remember:  Do not eat food or drink liquids after midnight.  Take these medicines the morning of surgery with A SIP OF WATER Allopurinol (Zyloprim), Flonase spray if needed, Tramadol (Ultram) if needed  Stop taking aspirin, BC"s, Goody's, Herbal medications, Celebrex, Herbal medications, Fish Oil, Ibuprofen, Advil, Motrin, Aleve   Do not wear jewelry, make-up or nail polish.  Do not wear lotions, powders, or perfumes, or deoderant.  Do not shave 48 hours prior to surgery.  Men may shave face and neck.  Do not bring valuables to the hospital.  [facility] is not responsible for any belongings or valuables.  Contacts, dentures or bridgework may not be worn into surgery.  Leave your suitcase in the car.  After surgery it may be brought to your room.  For patients admitted to the hospital, discharge time will be determined by your treatment team.  Patients discharged the day of surgery will not be allowed to drive home.    Special instructions:  [facility] - Preparing for Surgery  Before surgery, you can play an important role.  Because skin is not sterile, your skin needs to be as free of germs as possible.  You can reduce the number of germs on you skin by washing with CHG (chlorahexidine gluconate) soap before surgery.  CHG is an antiseptic cleaner which kills germs and bonds with the skin to continue killing germs even after washing.  Please DO NOT use if you have  an allergy to CHG or antibacterial soaps.  If your skin becomes reddened/irritated stop using the CHG and inform your nurse when you arrive at Short Stay.  Do not shave (including legs and underarms) for at least 48 hours prior to the first CHG shower.  You may shave your face.  Please follow these instructions carefully:   1.  Shower with CHG Soap the night before surgery and the   morning of Surgery.  2.  If you choose to wash your hair, wash your hair first as usual with your   normal shampoo.  3.  After you shampoo, rinse your hair and body thoroughly to remove the Shampoo.  4.  Use CHG as you would any other liquid soap.  You can apply chg directly  to the skin and wash gently with scrungie or a clean washcloth.  5.  Apply the CHG Soap to your body ONLY FROM THE NECK DOWN.     Do not use on open wounds or open sores.  Avoid contact with your eyes,       ears, mouth and genitals (private parts).  Wash genitals (private parts)  with your normal soap.  6.  Wash thoroughly, paying special attention to the area where your surgery will be performed.  7.  Thoroughly rinse your body with warm water from the neck down.  8.  DO NOT shower/wash with your normal soap after using and rinsing off the CHG Soap.  9.  Pat yourself dry with a clean towel.            10.  Wear clean pajamas.            11.  Place clean sheets on your bed the night of your first shower and do not sleep with pets.  Day of Surgery  Do not apply any lotions/deoderants the morning of surgery.  Please wear clean clothes to the hospital/surgery center.     Please read over the following fact sheets that you were given. Pain Booklet, Coughing and Deep Breathing, MRSA Information and Surgical Site Infection Prevention, Incentive Spirometry

## 2016-12-05 NOTE — Progress Notes (Addendum)
PCP is Dr. Orpah Melter States he saw a heart Dr many years ago maybe in  2011, doesn't remember the Dr name.  Echo and stress test noted from 2011 Denies ever having a card cath.  Denies any chest pain, cough, or fever.  Request sent to Dr Olen Pel for EKG to compare.

## 2016-12-06 LAB — SURGICAL PCR SCREEN
MRSA, PCR: POSITIVE — AB
STAPHYLOCOCCUS AUREUS: POSITIVE — AB

## 2016-12-06 NOTE — Progress Notes (Signed)
Anesthesia Chart Review:  Pt is a 61 year old male scheduled for conversion of left total shoulder to reverse total shoulder on 12/08/2016 with Victorino December, M.D. X  PMH includes: HTN, GERD. Never smoker. BMI 30.  Medications include: Amlodipine, propranolol  Preoperative labs reviewed.  EKG 12/05/16: Sinus bradycardia (56 bpm).  Inferior infarct, age undetermined. Q wave in III intermittently present on EKG dating back to 07/14/99.    Stress echo 12/10/09:  - Technically difficult study; stress echocardiogram with no chest pain, no ST changes, and no stress-induced wall motion abnormalities. Note patient did complain of dyspnea and oxygen saturations dropped to 83-84% with exercise.  If no changes, I anticipate pt can proceed with surgery as scheduled.   Willeen Cass, FNP-BC [facility]/Anesthesiology Phone: [phone] 12/06/2016 12:25 PM

## 2016-12-06 NOTE — Progress Notes (Signed)
Received call from lab stating that patient had a positive MRSA PCR.  Patient was notified on 12/05/16.

## 2016-12-07 MED ORDER — CEFAZOLIN SODIUM-DEXTROSE 2-4 GM/100ML-% IV SOLN: 2.0000 g | INTRAVENOUS | Status: DC

## 2016-12-07 MED ORDER — TRANEXAMIC ACID 1000 MG/10ML IV SOLN
1000.0000 mg | INTRAVENOUS | Status: AC
  Administered 2016-12-08: 1000 mg via INTRAVENOUS
  Filled 2016-12-07: qty 10

## 2016-12-08 ENCOUNTER — Inpatient Hospital Stay (HOSPITAL_COMMUNITY): Payer: PPO | Admitting: Anesthesiology

## 2016-12-08 ENCOUNTER — Encounter (HOSPITAL_COMMUNITY)
Admission: RE | Disposition: A | Discharge status: Home / Self Care | Payer: Self-pay | Source: Ambulatory Visit | Attending: Orthopedic Surgery

## 2016-12-08 ENCOUNTER — Inpatient Hospital Stay (HOSPITAL_COMMUNITY): Payer: PPO

## 2016-12-08 ENCOUNTER — Inpatient Hospital Stay (HOSPITAL_COMMUNITY): Payer: PPO | Admitting: Emergency Medicine

## 2016-12-08 ENCOUNTER — Encounter (HOSPITAL_COMMUNITY): Payer: Self-pay | Admitting: *Deleted

## 2016-12-08 ENCOUNTER — Inpatient Hospital Stay (HOSPITAL_COMMUNITY)
Admission: RE | Admit: 2016-12-08 | Discharge: 2016-12-09 | DRG: 483 | Disposition: A | Discharge status: Home / Self Care | Payer: PPO | Source: Ambulatory Visit | Attending: Orthopedic Surgery | Admitting: Orthopedic Surgery

## 2016-12-08 DIAGNOSIS — I10 Essential (primary) hypertension: Secondary | ICD-10-CM | POA: Diagnosis present

## 2016-12-08 DIAGNOSIS — K219 Gastro-esophageal reflux disease without esophagitis: Secondary | ICD-10-CM | POA: Diagnosis not present

## 2016-12-08 DIAGNOSIS — Z96653 Presence of artificial knee joint, bilateral: Secondary | ICD-10-CM | POA: Diagnosis not present

## 2016-12-08 DIAGNOSIS — T84028A Dislocation of other internal joint prosthesis, initial encounter: Principal | ICD-10-CM | POA: Diagnosis present

## 2016-12-08 DIAGNOSIS — Z96612 Presence of left artificial shoulder joint: Secondary | ICD-10-CM

## 2016-12-08 DIAGNOSIS — T84098A Other mechanical complication of other internal joint prosthesis, initial encounter: Secondary | ICD-10-CM | POA: Diagnosis not present

## 2016-12-08 DIAGNOSIS — Y838 Other surgical procedures as the cause of abnormal reaction of the patient, or of later complication, without mention of misadventure at the time of the procedure: Secondary | ICD-10-CM | POA: Diagnosis present

## 2016-12-08 DIAGNOSIS — M19012 Primary osteoarthritis, left shoulder: Secondary | ICD-10-CM | POA: Diagnosis not present

## 2016-12-08 DIAGNOSIS — G8918 Other acute postprocedural pain: Secondary | ICD-10-CM | POA: Diagnosis not present

## 2016-12-08 DIAGNOSIS — Z471 Aftercare following joint replacement surgery: Secondary | ICD-10-CM | POA: Diagnosis not present

## 2016-12-08 DIAGNOSIS — Z79899 Other long term (current) drug therapy: Secondary | ICD-10-CM | POA: Diagnosis not present

## 2016-12-08 DIAGNOSIS — M25512 Pain in left shoulder: Secondary | ICD-10-CM | POA: Diagnosis not present

## 2016-12-08 HISTORY — PX: REVERSE SHOULDER ARTHROPLASTY: SHX5054

## 2016-12-08 SURGERY — ARTHROPLASTY, SHOULDER, TOTAL, REVERSE
Anesthesia: Regional | Site: Shoulder | Laterality: Left

## 2016-12-08 MED ORDER — LACTATED RINGERS IV SOLN
INTRAVENOUS | Status: DC
  Administered 2016-12-08 (×2): via INTRAVENOUS

## 2016-12-08 MED ORDER — LIDOCAINE 2% (20 MG/ML) 5 ML SYRINGE
INTRAMUSCULAR | Status: AC
  Filled 2016-12-08: qty 5

## 2016-12-08 MED ORDER — ONDANSETRON HCL 4 MG/2ML IJ SOLN
INTRAMUSCULAR | Status: AC
  Filled 2016-12-08: qty 2

## 2016-12-08 MED ORDER — ONDANSETRON 4 MG PO TBDP: 4.0000 mg | ORAL_TABLET | Freq: Three times a day (TID) | ORAL | 0 refills | Status: DC | PRN

## 2016-12-08 MED ORDER — AMLODIPINE BESYLATE 10 MG PO TABS
10.0000 mg | ORAL_TABLET | Freq: Every day | ORAL | Status: DC
  Administered 2016-12-08: 10 mg via ORAL
  Filled 2016-12-08: qty 1

## 2016-12-08 MED ORDER — VANCOMYCIN HCL IN DEXTROSE 1-5 GM/200ML-% IV SOLN
1000.0000 mg | Freq: Once | INTRAVENOUS | Status: AC
  Administered 2016-12-09: 1000 mg via INTRAVENOUS
  Filled 2016-12-08: qty 200

## 2016-12-08 MED ORDER — CELECOXIB 200 MG PO CAPS
200.0000 mg | ORAL_CAPSULE | Freq: Two times a day (BID) | ORAL | Status: DC
  Administered 2016-12-08 – 2016-12-09 (×2): 200 mg via ORAL
  Filled 2016-12-08 (×2): qty 1

## 2016-12-08 MED ORDER — DOCUSATE SODIUM 100 MG PO CAPS
100.0000 mg | ORAL_CAPSULE | Freq: Two times a day (BID) | ORAL | Status: DC
  Administered 2016-12-08 – 2016-12-09 (×2): 100 mg via ORAL
  Filled 2016-12-08 (×2): qty 1

## 2016-12-08 MED ORDER — FENTANYL CITRATE (PF) 100 MCG/2ML IJ SOLN
50.0000 ug | Freq: Once | INTRAMUSCULAR | Status: AC
  Administered 2016-12-08: 50 ug via INTRAVENOUS
  Filled 2016-12-08: qty 1

## 2016-12-08 MED ORDER — PROPOFOL 10 MG/ML IV BOLUS
INTRAVENOUS | Status: DC | PRN
  Administered 2016-12-08: 200 mg via INTRAVENOUS

## 2016-12-08 MED ORDER — PHENYLEPHRINE HCL 10 MG/ML IJ SOLN
INTRAVENOUS | Status: DC | PRN
  Administered 2016-12-08: 25 ug/min via INTRAVENOUS

## 2016-12-08 MED ORDER — ROCURONIUM BROMIDE 10 MG/ML (PF) SYRINGE
PREFILLED_SYRINGE | INTRAVENOUS | Status: AC
  Filled 2016-12-08: qty 5

## 2016-12-08 MED ORDER — SUGAMMADEX SODIUM 200 MG/2ML IV SOLN
INTRAVENOUS | Status: DC | PRN
  Administered 2016-12-08: 200 mg via INTRAVENOUS

## 2016-12-08 MED ORDER — LIDOCAINE HCL (CARDIAC) 20 MG/ML IV SOLN
INTRAVENOUS | Status: DC | PRN
  Administered 2016-12-08: 100 mg via INTRAVENOUS

## 2016-12-08 MED ORDER — MEPERIDINE HCL 25 MG/ML IJ SOLN: 6.2500 mg | INTRAMUSCULAR | Status: DC | PRN

## 2016-12-08 MED ORDER — PHENYLEPHRINE 40 MCG/ML (10ML) SYRINGE FOR IV PUSH (FOR BLOOD PRESSURE SUPPORT)
PREFILLED_SYRINGE | INTRAVENOUS | Status: DC | PRN
Start: 2016-12-08 — End: 2016-12-08
  Administered 2016-12-08: 40 ug via INTRAVENOUS
  Administered 2016-12-08: 120 ug via INTRAVENOUS

## 2016-12-08 MED ORDER — PROMETHAZINE HCL 25 MG/ML IJ SOLN: 6.2500 mg | INTRAMUSCULAR | Status: DC | PRN

## 2016-12-08 MED ORDER — BUPIVACAINE-EPINEPHRINE (PF) 0.5% -1:200000 IJ SOLN
INTRAMUSCULAR | Status: DC | PRN
  Administered 2016-12-08: 30 mL via PERINEURAL

## 2016-12-08 MED ORDER — ONDANSETRON HCL 4 MG PO TABS: 4.0000 mg | ORAL_TABLET | Freq: Four times a day (QID) | ORAL | Status: DC | PRN

## 2016-12-08 MED ORDER — ONDANSETRON HCL 4 MG/2ML IJ SOLN: 4.0000 mg | Freq: Four times a day (QID) | INTRAMUSCULAR | Status: DC | PRN

## 2016-12-08 MED ORDER — VANCOMYCIN HCL IN DEXTROSE 1-5 GM/200ML-% IV SOLN
1000.0000 mg | Freq: Once | INTRAVENOUS | Status: AC
  Administered 2016-12-08: 1000 mg via INTRAVENOUS
  Filled 2016-12-08: qty 200

## 2016-12-08 MED ORDER — DEXAMETHASONE SODIUM PHOSPHATE 10 MG/ML IJ SOLN
INTRAMUSCULAR | Status: AC
  Filled 2016-12-08: qty 1

## 2016-12-08 MED ORDER — HYDROMORPHONE HCL 1 MG/ML IJ SOLN: 0.2500 mg | INTRAMUSCULAR | Status: DC | PRN

## 2016-12-08 MED ORDER — CHLORHEXIDINE GLUCONATE 4 % EX LIQD: 60.0000 mL | Freq: Once | CUTANEOUS | Status: DC

## 2016-12-08 MED ORDER — ACETAMINOPHEN 650 MG RE SUPP: 650.0000 mg | Freq: Four times a day (QID) | RECTAL | Status: DC | PRN

## 2016-12-08 MED ORDER — MIDAZOLAM HCL 2 MG/2ML IJ SOLN
2.0000 mg | Freq: Once | INTRAMUSCULAR | Status: AC
  Administered 2016-12-08: 2 mg via INTRAVENOUS
  Filled 2016-12-08: qty 2

## 2016-12-08 MED ORDER — METHOCARBAMOL 1000 MG/10ML IJ SOLN
500.0000 mg | Freq: Four times a day (QID) | INTRAVENOUS | Status: DC | PRN
  Filled 2016-12-08: qty 5

## 2016-12-08 MED ORDER — SUGAMMADEX SODIUM 200 MG/2ML IV SOLN
INTRAVENOUS | Status: AC
  Filled 2016-12-08: qty 2

## 2016-12-08 MED ORDER — PROPOFOL 10 MG/ML IV BOLUS
INTRAVENOUS | Status: AC
  Filled 2016-12-08: qty 20

## 2016-12-08 MED ORDER — HYDROMORPHONE HCL 1 MG/ML IJ SOLN: 1.0000 mg | INTRAMUSCULAR | Status: DC | PRN

## 2016-12-08 MED ORDER — DEXAMETHASONE SODIUM PHOSPHATE 10 MG/ML IJ SOLN
INTRAMUSCULAR | Status: DC | PRN
  Administered 2016-12-08: 10 mg via INTRAVENOUS

## 2016-12-08 MED ORDER — ACETAMINOPHEN 325 MG PO TABS: 650.0000 mg | ORAL_TABLET | Freq: Four times a day (QID) | ORAL | Status: DC | PRN

## 2016-12-08 MED ORDER — FLUTICASONE PROPIONATE 50 MCG/ACT NA SUSP
2.0000 | Freq: Every day | NASAL | Status: DC | PRN
  Filled 2016-12-08: qty 16

## 2016-12-08 MED ORDER — MIDAZOLAM HCL 2 MG/2ML IJ SOLN
INTRAMUSCULAR | Status: AC
  Administered 2016-12-08: 2 mg via INTRAVENOUS
  Filled 2016-12-08: qty 2

## 2016-12-08 MED ORDER — 0.9 % SODIUM CHLORIDE (POUR BTL) OPTIME
TOPICAL | Status: DC | PRN
  Administered 2016-12-08: 1000 mL

## 2016-12-08 MED ORDER — SUCCINYLCHOLINE CHLORIDE 200 MG/10ML IV SOSY
PREFILLED_SYRINGE | INTRAVENOUS | Status: AC
  Filled 2016-12-08: qty 10

## 2016-12-08 MED ORDER — ASPIRIN EC 325 MG PO TBEC: 325.0000 mg | DELAYED_RELEASE_TABLET | Freq: Every day | ORAL | 3 refills | Status: AC

## 2016-12-08 MED ORDER — PROPRANOLOL HCL ER 80 MG PO CP24
80.0000 mg | ORAL_CAPSULE | Freq: Every day | ORAL | Status: DC
  Administered 2016-12-08: 80 mg via ORAL
  Filled 2016-12-08: qty 1

## 2016-12-08 MED ORDER — HYDROMORPHONE HCL 2 MG PO TABS: 2.0000 mg | ORAL_TABLET | ORAL | 0 refills | Status: DC | PRN

## 2016-12-08 MED ORDER — HYDROMORPHONE HCL 2 MG PO TABS
2.0000 mg | ORAL_TABLET | ORAL | Status: DC | PRN
  Administered 2016-12-08 – 2016-12-09 (×2): 2 mg via ORAL
  Filled 2016-12-08 (×3): qty 1

## 2016-12-08 MED ORDER — MIDAZOLAM HCL 2 MG/2ML IJ SOLN
INTRAMUSCULAR | Status: AC
  Filled 2016-12-08: qty 2

## 2016-12-08 MED ORDER — UBIQUINOL 100 MG PO CAPS: 100.0000 mg | ORAL_CAPSULE | Freq: Every day | ORAL | Status: DC

## 2016-12-08 MED ORDER — PHENYLEPHRINE 40 MCG/ML (10ML) SYRINGE FOR IV PUSH (FOR BLOOD PRESSURE SUPPORT)
PREFILLED_SYRINGE | INTRAVENOUS | Status: AC
  Filled 2016-12-08: qty 10

## 2016-12-08 MED ORDER — ALLOPURINOL 300 MG PO TABS
300.0000 mg | ORAL_TABLET | Freq: Every day | ORAL | Status: DC
  Administered 2016-12-09: 300 mg via ORAL
  Filled 2016-12-08: qty 1

## 2016-12-08 MED ORDER — METHOCARBAMOL 500 MG PO TABS
500.0000 mg | ORAL_TABLET | Freq: Four times a day (QID) | ORAL | Status: DC | PRN
  Administered 2016-12-08: 500 mg via ORAL
  Filled 2016-12-08: qty 1

## 2016-12-08 MED ORDER — FENTANYL CITRATE (PF) 100 MCG/2ML IJ SOLN
INTRAMUSCULAR | Status: AC
  Administered 2016-12-08: 50 ug via INTRAVENOUS
  Filled 2016-12-08: qty 2

## 2016-12-08 MED ORDER — ROCURONIUM BROMIDE 100 MG/10ML IV SOLN
INTRAVENOUS | Status: DC | PRN
  Administered 2016-12-08 (×3): 30 mg via INTRAVENOUS
  Administered 2016-12-08: 20 mg via INTRAVENOUS
  Administered 2016-12-08: 40 mg via INTRAVENOUS

## 2016-12-08 SURGICAL SUPPLY — 75 items
BEARING HUIMERAL 44-36 3P STD (Orthopedic Implant) ×1 IMPLANT
BIT DRILL F/CENTRAL SCRW 3.2 (BIT) ×1
BIT DRILL F/CENTRAL SCRW 3.2MM (BIT) ×1 IMPLANT
BIT DRILL TWIST 2.7 (BIT) ×2 IMPLANT
BLADE SAW SGTL 83.5X18.5 (BLADE) ×2 IMPLANT
COVER SURGICAL LIGHT HANDLE (MISCELLANEOUS) ×2 IMPLANT
DERMABOND ADVANCED (GAUZE/BANDAGES/DRESSINGS) ×1
DERMABOND ADVANCED .7 DNX12 (GAUZE/BANDAGES/DRESSINGS) ×1 IMPLANT
DRAPE ORTHO SPLIT 77X108 STRL (DRAPES) ×2
DRAPE SURG 17X11 SM STRL (DRAPES) ×2 IMPLANT
DRAPE SURG ORHT 6 SPLT 77X108 (DRAPES) ×2 IMPLANT
DRAPE U-SHAPE 47X51 STRL (DRAPES) ×2 IMPLANT
DRILL BIT F/CENTRAL SCRW 3.2MM (BIT) ×1
DRSG AQUACEL AG ADV 3.5X 6 (GAUZE/BANDAGES/DRESSINGS) ×2 IMPLANT
DRSG AQUACEL AG ADV 3.5X10 (GAUZE/BANDAGES/DRESSINGS) ×2 IMPLANT
DURAPREP 26ML APPLICATOR (WOUND CARE) ×2 IMPLANT
ELECT BLADE 4.0 EZ CLEAN MEGAD (MISCELLANEOUS) ×2
ELECT CAUTERY BLADE 6.4 (BLADE) ×2 IMPLANT
ELECT REM PT RETURN 9FT ADLT (ELECTROSURGICAL) ×2
ELECTRODE BLDE 4.0 EZ CLN MEGD (MISCELLANEOUS) ×1 IMPLANT
ELECTRODE REM PT RTRN 9FT ADLT (ELECTROSURGICAL) ×1 IMPLANT
FACESHIELD WRAPAROUND (MASK) IMPLANT
GLENOID SPHERE STD STRL 36MM (Orthopedic Implant) ×2 IMPLANT
GLENOID SPHERE STRL 28MM (Orthopedic Implant) ×2 IMPLANT
GLOVE BIO SURGEON STRL SZ7.5 (GLOVE) IMPLANT
GLOVE BIO SURGEON STRL SZ8 (GLOVE) IMPLANT
GLOVE EUDERMIC 7 POWDERFREE (GLOVE) IMPLANT
GLOVE SS BIOGEL STRL SZ 7.5 (GLOVE) IMPLANT
GLOVE SUPERSENSE BIOGEL SZ 7.5 (GLOVE)
GOWN STRL REUS W/ TWL LRG LVL3 (GOWN DISPOSABLE) ×1 IMPLANT
GOWN STRL REUS W/ TWL XL LVL3 (GOWN DISPOSABLE) ×2 IMPLANT
GOWN STRL REUS W/TWL LRG LVL3 (GOWN DISPOSABLE) ×1
GOWN STRL REUS W/TWL XL LVL3 (GOWN DISPOSABLE) ×2
HEAD HUMERAL COMP STD (Orthopedic Implant) ×1 IMPLANT
HUMERAL BEARING 44-36 3P STD (Orthopedic Implant) ×2 IMPLANT
HUMERAL HEAD COMP STD (Orthopedic Implant) ×2 IMPLANT
KIT BASIN OR (CUSTOM PROCEDURE TRAY) ×2 IMPLANT
KIT ROOM TURNOVER OR (KITS) ×2 IMPLANT
MANIFOLD NEPTUNE II (INSTRUMENTS) ×2 IMPLANT
NDL SUT 6 .5 CRC .975X.05 MAYO (NEEDLE) IMPLANT
NEEDLE HYPO 25GX1X1/2 BEV (NEEDLE) IMPLANT
NEEDLE MAYO TAPER (NEEDLE)
NEEDLE TAPERED W/ NITINOL LOOP (MISCELLANEOUS) ×2 IMPLANT
NS IRRIG 1000ML POUR BTL (IV SOLUTION) ×2 IMPLANT
PACK SHOULDER (CUSTOM PROCEDURE TRAY) ×2 IMPLANT
PAD ARMBOARD 7.5X6 YLW CONV (MISCELLANEOUS) ×4 IMPLANT
PIN THREADED REVERSE (PIN) ×2 IMPLANT
PUTTY DBM STAGRAFT PLUS 5CC (Putty) ×2 IMPLANT
RESTRAINT HEAD UNIVERSAL NS (MISCELLANEOUS) ×2 IMPLANT
SCREW BONE LOCKING 4.75X35X3.5 (Screw) ×2 IMPLANT
SCREW BONE STRL 6.5MMX25MM (Screw) ×2 IMPLANT
SCREW CENTRAL 6.5X20MM (Screw) ×2 IMPLANT
SCREW LOCKING 4.75MMX15MM (Screw) ×2 IMPLANT
SCREW LOCKING NS 4.75MMX20MM (Screw) ×2 IMPLANT
SCREW LOCKING STRL 4.75X25X3.5 (Screw) ×2 IMPLANT
SLING ARM FOAM STRAP LRG (SOFTGOODS) IMPLANT
SPONGE LAP 18X18 X RAY DECT (DISPOSABLE) ×4 IMPLANT
SPONGE LAP 4X18 X RAY DECT (DISPOSABLE) ×2 IMPLANT
STEM HUMERAL STRL 9MMX83MM (Stem) ×2 IMPLANT
SUCTION FRAZIER HANDLE 10FR (MISCELLANEOUS) ×1
SUCTION TUBE FRAZIER 10FR DISP (MISCELLANEOUS) ×1 IMPLANT
SUT FIBERWIRE #2 38 T-5 BLUE (SUTURE) ×6
SUT MNCRL AB 3-0 PS2 18 (SUTURE) ×2 IMPLANT
SUT MON AB 2-0 CT1 27 (SUTURE) ×2 IMPLANT
SUT VIC AB 1 CT1 27 (SUTURE) ×1
SUT VIC AB 1 CT1 27XBRD ANBCTR (SUTURE) ×1 IMPLANT
SUTURE FIBERWR #2 38 T-5 BLUE (SUTURE) ×3 IMPLANT
SWAB COLLECTION DEVICE MRSA (MISCELLANEOUS) ×4 IMPLANT
SWAB CULTURE ESWAB REG 1ML (MISCELLANEOUS) ×4 IMPLANT
SYR CONTROL 10ML LL (SYRINGE) IMPLANT
TOWEL OR 17X24 6PK STRL BLUE (TOWEL DISPOSABLE) ×2 IMPLANT
TOWEL OR 17X26 10 PK STRL BLUE (TOWEL DISPOSABLE) ×2 IMPLANT
TRAY HUM STD 44MM (Orthopedic Implant) ×2 IMPLANT
WATER STERILE IRR 1000ML POUR (IV SOLUTION) ×2 IMPLANT
YANKAUER SUCT BULB TIP NO VENT (SUCTIONS) ×2 IMPLANT

## 2016-12-08 NOTE — Anesthesia Procedure Notes (Signed)
Anesthesia Regional Block: Interscalene brachial plexus block   Pre-Anesthetic Checklist: ,, timeout performed, Correct Patient, Correct Site, Correct Laterality, Correct Procedure, Correct Position, site marked, Risks and benefits discussed,  Surgical consent,  Pre-op evaluation,  At surgeon's request and post-op pain management  Laterality: Left  Prep: chloraprep       Needles:  Injection technique: Single-shot  Needle Type: Stimulator Needle - 40     Needle Length: 4cm  Needle Gauge: 22     Additional Needles:   Procedures: ultrasound guided,,,,,,,,  Narrative:  Start time: 12/08/2016 11:57 AM End time: 12/08/2016 11:59 AM Injection made incrementally with aspirations every 5 mL. Anesthesiologist: Nolon Nations  Additional Notes: BP cuff, EKG monitors applied. Sedation begun. Nerve location verified with U/S. Anesthetic injected incrementally, slowly , and after neg aspirations under direct u/s guidance. Good perineural spread. Tolerated well.

## 2016-12-08 NOTE — Anesthesia Procedure Notes (Signed)
Procedure Name: Intubation Date/Time: 12/08/2016 12:49 PM Performed by: Freddie Breech Pre-anesthesia Checklist: Patient identified, Emergency Drugs available, Suction available and Patient being monitored Patient Re-evaluated:Patient Re-evaluated prior to inductionOxygen Delivery Method: Circle System Utilized Preoxygenation: Pre-oxygenation with 100% oxygen Intubation Type: IV induction Ventilation: Mask ventilation without difficulty Laryngoscope Size: Mac and 4 Grade View: Grade I Tube type: Oral Tube size: 7.5 mm Number of attempts: 1 Airway Equipment and Method: Stylet and Oral airway Placement Confirmation: ETT inserted through vocal cords under direct vision,  positive ETCO2 and breath sounds checked- equal and bilateral Secured at: 23 cm Tube secured with: Tape Dental Injury: Teeth and Oropharynx as per pre-operative assessment

## 2016-12-08 NOTE — Anesthesia Postprocedure Evaluation (Signed)
Anesthesia Post Note  Patient: Geoffrey West [facility]  Procedure(s) Performed: Procedure(s) (LRB): Revision left total shoulder to reverse total shoulder arthroplasty (Left)     Patient location during evaluation: PACU Anesthesia Type: Regional Level of consciousness: awake, awake and alert and oriented Pain management: pain level controlled Vital Signs Assessment: post-procedure vital signs reviewed and stable Respiratory status: spontaneous breathing, nonlabored ventilation and respiratory function stable Cardiovascular status: blood pressure returned to baseline Anesthetic complications: no    Last Vitals:  Vitals:   12/08/16 1720 12/08/16 1740  BP: 101/88 113/81  Pulse: 88 86  Resp: 19 18  Temp: 36.9 C 36.4 C    Last Pain:  Vitals:   12/08/16 1740  TempSrc: Oral  PainSc:                  [name] COKER

## 2016-12-08 NOTE — Discharge Instructions (Signed)
-  wear your sling for comfort the next 2 weeks -ok to remove sling and perform pendulums, elbow range of motion, table slides and wall crawls when pain allows -keep bandage in place for the next 2 weeks, this will be removed in clinic -ok to shower with bandage in place, just do not submerge under water -return to clinic in two weeks for a wound check -no lifting over 10 pounds to the left arm -take a 325 mg aspirin once daily for 4 weeks for prevention of blood clots.

## 2016-12-08 NOTE — Anesthesia Preprocedure Evaluation (Addendum)
Anesthesia Evaluation  Patient identified by MRN, date of birth, ID band Patient awake    Reviewed: Allergy & Precautions, NPO status , Patient's Chart, lab work & pertinent test results  Airway Mallampati: II  TM Distance: >3 FB Neck ROM: Full    Dental no notable dental hx. (+) Teeth Intact   Pulmonary neg pulmonary ROS,    Pulmonary exam normal breath sounds clear to auscultation       Cardiovascular hypertension, Pt. on medications Normal cardiovascular exam Rhythm:Regular Rate:Normal     Neuro/Psych negative neurological ROS  negative psych ROS   GI/Hepatic Neg liver ROS, GERD  ,  Endo/Other  negative endocrine ROS  Renal/GU negative Renal ROS     Musculoskeletal negative musculoskeletal ROS (+)   Abdominal   Peds  Hematology negative hematology ROS (+)   Anesthesia Other Findings   Reproductive/Obstetrics negative OB ROS                            Anesthesia Physical Anesthesia Plan  ASA: II  Anesthesia Plan: General and Regional   Post-op Pain Management: GA combined w/ Regional for post-op pain   Induction: Intravenous  PONV Risk Score and Plan: 2 and Ondansetron, Dexamethasone and Propofol  Airway Management Planned: Oral ETT  Additional Equipment:   Intra-op Plan:   Post-operative Plan: Extubation in OR  Informed Consent: I have reviewed the patients History and Physical, chart, labs and discussed the procedure including the risks, benefits and alternatives for the proposed anesthesia with the patient or authorized representative who has indicated his/her understanding and acceptance.   Dental advisory given  Plan Discussed with: CRNA  Anesthesia Plan Comments:         Anesthesia Quick Evaluation

## 2016-12-08 NOTE — Transfer of Care (Signed)
Immediate Anesthesia Transfer of Care Note  Patient: Geoffrey West [facility]  Procedure(s) Performed: Procedure(s): Revision left total shoulder to reverse total shoulder arthroplasty (Left)  Patient Location: PACU  Anesthesia Type:General  Level of Consciousness: awake, alert  and oriented  Airway & Oxygen Therapy: Patient Spontanous Breathing and Patient connected to nasal cannula oxygen  Post-op Assessment: Report given to RN, Post -op Vital signs reviewed and stable and Patient moving all extremities  Post vital signs: Reviewed and stable  Last Vitals:  Vitals:   12/08/16 1200 12/08/16 1205  BP: (!) 150/99 (!) 151/100  Pulse: 74 73  Resp: 19 12  Temp:      Last Pain:  Vitals:   12/08/16 1205  TempSrc:   PainSc: 0-No pain         Complications: No apparent anesthesia complications block working well

## 2016-12-08 NOTE — Brief Op Note (Signed)
12/08/2016  3:41 PM  PATIENT:  EDUARD PENKALA  61 y.o. male  PRE-OPERATIVE DIAGNOSIS:  Left failed total shoulder  POST-OPERATIVE DIAGNOSIS:  Left failed total shoulder  PROCEDURE:  Procedure(s): Revision left total shoulder to reverse total shoulder arthroplasty (Left)  SURGEON:  Surgeon(s) and Role:    [name], MD - Primary  PHYSICIAN ASSISTANT:   ASSISTANTS: April Green, RNFA   ANESTHESIA:   regional and general  EBL:  Total I/O In: 1400 [I.V.:1400] Out: 300 [Blood:300]  BLOOD ADMINISTERED:none  DRAINS: none   LOCAL MEDICATIONS USED:  NONE  SPECIMEN:  No Specimen  DISPOSITION OF SPECIMEN:  N/A  COUNTS:  YES  TOURNIQUET:  * No tourniquets in log *  DICTATION: .Note written in EPIC  PLAN OF CARE: Admit to inpatient   PATIENT DISPOSITION:  PACU - hemodynamically stable.   Delay start of Pharmacological VTE agent (>24hrs) due to surgical blood loss or risk of bleeding: yes

## 2016-12-08 NOTE — H&P (Signed)
ORTHOPAEDIC H and P  REQUESTING PHYSICIAN: [name], MD  PCP:  Orpah Melter, MD  Chief Complaint: Failed left total shoulder  HPI: Geoffrey West is a 61 y.o. male who complains of continued left shoulder anterior subluxation and pain. He had a minor incident couple months ago now that precipitated this dislocation that is currently unstable due to subscapular strength insufficiency. He has a history of total shoulder arthroplasty performed about 8 years ago that was revised 5 years ago for a subscapularis tear at that time. He is here today for conversion of his total shoulder to reverse shoulder arthroplasty due to instability. We have had multiple discussions in the office and also a personal level regarding moving forward with the surgery today. He understands the risks benefits and indications of the procedure and is here today to proceed with that surgery. He has no new complaints today denies fevers night sweats chills. No shortness of breath or cough.    Past Medical History:  Diagnosis Date  . Arthritis   . GERD (gastroesophageal reflux disease)   . Hypertension    Past Surgical History:  Procedure Laterality Date  . BACK SURGERY    . COLONOSCOPY    . REPLACEMENT TOTAL KNEE     both knees replacments  . SHOULDER SURGERY     Social History   Social History  . Marital status: Single    Spouse name: N/A  . Number of children: N/A  . Years of education: N/A   Social History Main Topics  . Smoking status: Never Smoker  . Smokeless tobacco: Never Used  . Alcohol use No  . Drug use: No  . Sexual activity: Not Asked   Other Topics Concern  . None   Social History Narrative  . None   Family History  Problem Relation Age of Onset  . Arthritis Mother   . Arthritis Sister   . Alzheimer's disease Brother   . Arthritis Brother    Allergies  Allergen Reactions  . Fentanyl Shortness Of Breath    Reaction to [facility] ONLY > ? DOSE REGULATION ?     [name] Oxycodone Other (See Comments)    Pt states he goes through hot and cold flashes withdrawal like symptoms   Prior to Admission medications   Medication Sig Start Date End Date Taking? Authorizing Provider  allopurinol (ZYLOPRIM) 300 MG tablet Take 300 mg by mouth daily.   Yes [provider]  amLODipine (NORVASC) 10 MG tablet Take 10 mg by mouth at bedtime.    Yes [provider]  amoxicillin (AMOXIL) 500 MG capsule Take 2,000 mg by mouth See admin instructions. TAKE 4 CAPSULES (2000 MG) BY MOUTH 1 HOUR PRIOR TO DENTAL APPOINTMENTS 08/23/16  Yes [provider]  celecoxib (CELEBREX) 200 MG capsule Take 200 mg by mouth 2 (two) times daily. 11/06/16  Yes [provider]  fluticasone (FLONASE) 50 MCG/ACT nasal spray Place 2 sprays into both nostrils daily as needed. For allergies. 11/24/16  Yes [provider]  Misc Natural Products (ADRENAL PO) Take 1 tablet by mouth daily.   Yes [provider]  Multiple Vitamin (MULTIVITAMIN WITH MINERALS) TABS tablet Take 1 tablet by mouth daily.   Yes [provider]  propranolol ER (INDERAL LA) 80 MG 24 hr capsule Take 80 mg by mouth at bedtime.    Yes [provider]  testosterone cypionate (DEPOTESTOSTERONE CYPIONATE) 200 MG/ML injection Inject 100 mg into the muscle every Wednesday. 08/30/16  Yes [provider]  traMADol (ULTRAM) 50 MG tablet Take 50 mg by mouth every 6 (six) hours as needed (FOR SEVERE PAIN.).    Yes [provider]  Ubiquinol 100 MG CAPS Take 100 mg by mouth daily.   Yes [provider]  OVER THE COUNTER MEDICATION Take 15 mLs by mouth daily. Balance Oil    [provider]   No results found.  Positive ROS: All other systems have been reviewed and were otherwise negative with the exception of those mentioned in the HPI and as above.  Physical Exam: General: Alert, no acute distress Cardiovascular: No pedal edema Respiratory: No  cyanosis, no use of accessory musculature GI: No organomegaly, abdomen is soft and non-tender Skin: No lesions in the area of chief complaint Neurologic: Sensation intact distally Psychiatric: Patient is competent for consent with normal mood and affect Lymphatic: No axillary or cervical lymphadenopathy  MUSCULOSKELETAL:   Left shoulder -well-healed deltopectoral scar no warmth or erythema no signs of infection. He does have a fullness anteriorly of the humeral head is sitting. Otherwise he is neurovascular intact throughout the left upper extremity. Specifically intact in the axillary musculocutaneous median radial and ulnar nerves. 2+ radial pulse.   Assessment: Failed left total shoulder arthroplasty   Plan: -Plan will be to proceed today with conversion of his total shoulder arthroplasty to a reverse total shoulder arthroplasty. We discussed the risks benefits and indications this procedure at length these provided informed consent today. -We'll admit him for overnight observation following surgery plan to discharge him home tomorrow. He will begin physical therapy in 1 week. The risks, benefits, and alternatives were discussed with the patient. There are risks associated with the surgery including, but not limited to, problems with anesthesia (death), infection, differences in leg length/angulation/rotation, fracture of bones, loosening or failure of implants, malunion, nonunion, hematoma (blood accumulation) which may require surgical drainage, blood clots, pulmonary embolism, nerve injury (foot drop), and blood vessel injury. The patient understands these risks and elects to proceed.      [name], MD Cell [phone]    12/08/2016 11:50 AM

## 2016-12-09 LAB — CBC
HCT: 47.6 % (ref 39.0–52.0)
HEMOGLOBIN: 16.4 g/dL (ref 13.0–17.0)
MCH: 32.7 pg (ref 26.0–34.0)
MCHC: 34.5 g/dL (ref 30.0–36.0)
MCV: 95 fL (ref 78.0–100.0)
Platelets: 160 10*3/uL (ref 150–400)
RBC: 5.01 MIL/uL (ref 4.22–5.81)
RDW: 13.1 % (ref 11.5–15.5)
WBC: 15.7 10*3/uL — ABNORMAL HIGH (ref 4.0–10.5)

## 2016-12-09 MED ORDER — MUPIROCIN 2 % EX OINT
1.0000 "application " | TOPICAL_OINTMENT | Freq: Two times a day (BID) | CUTANEOUS | Status: DC
  Administered 2016-12-09: 1 via NASAL
  Filled 2016-12-09: qty 22

## 2016-12-09 MED ORDER — FAMOTIDINE 20 MG PO TABS
20.0000 mg | ORAL_TABLET | Freq: Every day | ORAL | Status: DC
  Administered 2016-12-09: 20 mg via ORAL
  Filled 2016-12-09: qty 1

## 2016-12-09 MED ORDER — ALUM & MAG HYDROXIDE-SIMETH 200-200-20 MG/5ML PO SUSP
30.0000 mL | ORAL | Status: DC | PRN
  Administered 2016-12-09: 30 mL via ORAL
  Filled 2016-12-09: qty 30

## 2016-12-09 MED ORDER — CHLORHEXIDINE GLUCONATE CLOTH 2 % EX PADS
6.0000 | MEDICATED_PAD | Freq: Every day | CUTANEOUS | Status: DC
  Administered 2016-12-09: 6 via TOPICAL

## 2016-12-09 NOTE — Progress Notes (Signed)
Patient discharged home. Prescriptions given, IV removed and patient escorted to lobby by niece.

## 2016-12-09 NOTE — Progress Notes (Signed)
   Subjective:  Patient reports pain as mild to moderate.  Patient states block has not worn off. No pain.  Objective:   VITALS:   Vitals:   12/08/16 1740 12/08/16 2131 12/09/16 0043 12/09/16 0458  BP: 113/81 124/85 121/78 124/76  Pulse: 86 (!) 102 91 82  Resp: 18 18 18 16   Temp: 97.6 F (36.4 C) 99.3 F (37.4 C) 98.9 F (37.2 C) 98.3 F (36.8 C)  TempSrc: Oral Oral Oral Oral  SpO2: 98% 96% 96% 96%  Weight:      Height:        NAD Sling intact Dressing c/d/i Motor: (+) AIN, PIN, U, axillary. Elbow flexion weak. Sensation altered due to block. 2+ radial   Lab Results  Component Value Date   WBC 15.7 (H) 12/09/2016   HGB 16.4 12/09/2016   HCT 47.6 12/09/2016   MCV 95.0 12/09/2016   PLT 160 12/09/2016   BMET    Component Value Date/Time   NA 138 12/05/2016 1601   K 3.8 12/05/2016 1601   CL 101 12/05/2016 1601   CO2 29 12/05/2016 1601   GLUCOSE 90 12/05/2016 1601   BUN 16 12/05/2016 1601   CREATININE 1.21 12/05/2016 1601   CALCIUM 9.7 12/05/2016 1601   GFRNONAA >60 12/05/2016 1601   GFRAA >60 12/05/2016 1601     Assessment/Plan: 1 Day Post-Op   Active Problems:   S/P reverse total shoulder arthroplasty, left   Sling at all times except axillary care, hygiene PO pain control DVT ppx: ASA, early ambulation D/C home   [place], Horald Pollen 12/09/2016, 9:43 AM   Rod Can, MD Cell [phone]

## 2016-12-09 NOTE — Evaluation (Signed)
Occupational Therapy Evaluation and Discharge Patient Details Name: Geoffrey West MRN: [id number] DOB: [birth year] Today's Date: 12/09/2016    History of Present Illness Pt is 61 y/o nmale s/p left total shoulder replacement.  Pt has a past medical history of Arthritis; GERD; Hypertension;  Replacement total knee; Shoulder surgery; Back surgery; and Colonoscopy.    Clinical Impression   PTA pt independent in ADL and mobility. Pt currently Mod I in ADL and mobility. Pt provided with OT shoulder DC handout and reviewed in full. Pt feels comfortable and confident in ADL compensatory strategies and sequencing. Pt also confident in HEP until follow up with surgeon and outpatient therapy. No DME needs, Education complete. OT to sign off. Thank you for this referral.     Follow Up Recommendations  DC plan and follow up therapy as arranged by surgeon    Equipment Recommendations  None recommended by OT    Recommendations for Other Services       Precautions / Restrictions Precautions Precautions: Shoulder Type of Shoulder Precautions: Conservative Protocol Shoulder Interventions: Shoulder sling/immobilizer;At all times;For comfort;Off for dressing/bathing/exercises Precaution Booklet Issued: Yes (comment) Precaution Comments: OT shoulder protocol handout reviewed in full Required Braces or Orthoses: Sling Restrictions Weight Bearing Restrictions: Yes LUE Weight Bearing: Partial weight bearing LUE Partial Weight Bearing Percentage or Pounds: 10 lbs      Mobility Bed Mobility               General bed mobility comments: Pt sitting OOB in recliner when OT entered the room  Transfers Overall transfer level: Modified independent Equipment used: None                  Balance Overall balance assessment: No apparent balance deficits (not formally assessed)                                         ADL either performed or assessed with clinical judgement    ADL Overall ADL's : Needs assistance/impaired                         Toilet Transfer: Modified Independent   Toileting- Clothing Manipulation and Hygiene: Modified independent       Functional mobility during ADLs: Modified independent General ADL Comments: See shoulder section below     Vision Baseline Vision/History: Wears glasses Patient Visual Report: No change from baseline Vision Assessment?: No apparent visual deficits     Perception     Praxis      Pertinent Vitals/Pain Pain Assessment: Faces Faces Pain Scale: Hurts a little bit Pain Location: L shoulder Pain Descriptors / Indicators: Numbness;Heaviness;Tingling Pain Intervention(s): Limited activity within patient's tolerance;Monitored during session;Repositioned;Ice applied     Hand Dominance Right   Extremity/Trunk Assessment Upper Extremity Assessment Upper Extremity Assessment: LUE deficits/detail LUE Deficits / Details: s/ p surgery LUE: Unable to fully assess due to immobilization LUE Sensation: decreased light touch LUE Coordination: decreased fine motor;decreased gross motor   Lower Extremity Assessment Lower Extremity Assessment: Overall WFL for tasks assessed       Communication Communication Communication: No difficulties   Cognition Arousal/Alertness: Awake/alert Behavior During Therapy: WFL for tasks assessed/performed Overall Cognitive Status: Within Functional Limits for tasks assessed  General Comments       Exercises Exercises: Shoulder Shoulder Exercises Pendulum Exercise: Left;10 reps;Standing Elbow Flexion: AROM;Left;15 reps;Standing Elbow Extension: AROM;Left;15 reps;Standing Wrist Flexion: AROM;Left Wrist Extension: AROM;Left Digit Composite Flexion: AROM;Left Composite Extension: AROM;Left Neck Flexion: AROM Neck Extension: AROM Neck Lateral Flexion - Right: AROM Neck Lateral Flexion - Left: AROM    Shoulder Instructions Shoulder Instructions Donning/doffing shirt without moving shoulder: Modified independent Method for sponge bathing under operated UE: Modified independent Donning/doffing sling/immobilizer: Modified independent Correct positioning of sling/immobilizer: Modified independent Pendulum exercises (written home exercise program): Modified independent ROM for elbow, wrist and digits of operated UE: Modified independent Sling wearing schedule (on at all times/off for ADL's): Modified independent Proper positioning of operated UE when showering: Modified independent Positioning of UE while sleeping: Modified independent    Home Living Family/patient expects to be discharged to:: Private residence Living Arrangements: Alone Available Help at Discharge: Family;Friend(s);Available PRN/intermittently               Bathroom Shower/Tub: Teacher, early years/pre: Standard     Home Equipment: None          Prior Functioning/Environment Level of Independence: Independent        Comments: retired Art therapist, drives, very active, loves rehab        OT Problem List: Decreased strength;Decreased range of motion;Decreased activity tolerance;Decreased knowledge of precautions;Impaired UE functional use;Pain      OT Treatment/Interventions:      OT Goals(Current goals can be found in the care plan section) Acute Rehab OT Goals Patient Stated Goal: to get back to golfing again OT Goal Formulation: With patient Time For Goal Achievement: 12/23/16 Potential to Achieve Goals: Good  OT Frequency:     Barriers to D/C:            Co-evaluation              AM-PAC PT "6 Clicks" Daily Activity     Outcome Measure Help from another person eating meals?: None Help from another person taking care of personal grooming?: None Help from another person toileting, which includes using toliet, bedpan, or urinal?: None Help from another person bathing  (including washing, rinsing, drying)?: None Help from another person to put on and taking off regular upper body clothing?: None Help from another person to put on and taking off regular lower body clothing?: None 6 Click Score: 24   End of Session Equipment Utilized During Treatment: Other (comment) (sling) Nurse Communication: Mobility status;Other (comment) (IV came out during session)  Activity Tolerance: Patient tolerated treatment well Patient left: in chair;with call bell/phone within reach  OT Visit Diagnosis: Pain Pain - Right/Left: Left Pain - part of body: Shoulder                Time: 3086-5784 OT Time Calculation (min): 28 min Charges:  OT General Charges $OT Visit: 1 Procedure OT Evaluation $OT Eval Moderate Complexity: 1 Procedure OT Treatments $Self Care/Home Management : 8-22 mins G-Codes:     Hulda Humphrey OTR/L [place] 12/09/2016, 9:56 AM

## 2016-12-09 NOTE — Discharge Summary (Signed)
Physician Discharge Summary  Patient ID: Geoffrey West MRN: [id number] DOB/AGE: [birth year] 60 y.o.  Admit date: 12/08/2016 Discharge date: 12/09/2016  Admission Diagnoses:  <principal problem not specified>  Discharge Diagnoses:  Active Problems:   S/P reverse total shoulder arthroplasty, left   Past Medical History:  Diagnosis Date  . Arthritis   . GERD (gastroesophageal reflux disease)   . Hypertension     Surgeries: Procedure(s): Revision left total shoulder to reverse total shoulder arthroplasty on 12/08/2016   Consultants (if any):   Discharged Condition: Improved  Hospital Course: Geoffrey West is an 61 y.o. male who was admitted 12/08/2016 with a diagnosis of <principal problem not specified> and went to the operating room on 12/08/2016 and underwent the above named procedures.    He was given perioperative antibiotics:  Anti-infectives    Start     Dose/Rate Route Frequency Ordered Stop   12/09/16 0700  vancomycin (VANCOCIN) IVPB 1000 mg/200 mL premix     1,000 mg 200 mL/hr over 60 Minutes Intravenous  Once 12/08/16 1734 12/09/16 0829   12/08/16 1200  ceFAZolin (ANCEF) IVPB 2g/100 mL premix  Status:  Discontinued     2 g 200 mL/hr over 30 Minutes Intravenous To ShortStay Surgical 12/07/16 1023 12/08/16 1151   12/08/16 1200  vancomycin (VANCOCIN) IVPB 1000 mg/200 mL premix     1,000 mg 200 mL/hr over 60 Minutes Intravenous  Once 12/08/16 1151 12/08/16 1350    .  He was given sequential compression devices, early ambulation, and ASA for DVT prophylaxis.  He benefited maximally from the hospital stay and there were no complications.    Recent vital signs:  Vitals:   12/09/16 0043 12/09/16 0458  BP: 121/78 124/76  Pulse: 91 82  Resp: 18 16  Temp: 98.9 F (37.2 C) 98.3 F (36.8 C)    Recent laboratory studies:  Lab Results  Component Value Date   HGB 16.4 12/09/2016   HGB 18.3 (H) 12/05/2016   HGB 16.1 12/10/2009   Lab Results  Component Value  Date   WBC 15.7 (H) 12/09/2016   PLT 160 12/09/2016   No results found for: INR Lab Results  Component Value Date   NA 138 12/05/2016   K 3.8 12/05/2016   CL 101 12/05/2016   CO2 29 12/05/2016   BUN 16 12/05/2016   CREATININE 1.21 12/05/2016   GLUCOSE 90 12/05/2016    Discharge Medications:   Allergies as of 12/09/2016      Reactions   Fentanyl Shortness Of Breath   Reaction to [facility] ONLY > ? DOSE REGULATION ?   Oxycodone Other (See Comments)   Pt states he goes through hot and cold flashes withdrawal like symptoms      Medication List    STOP taking these medications   traMADol 50 MG tablet Commonly known as:  ULTRAM     TAKE these medications   ADRENAL PO Take 1 tablet by mouth daily.   allopurinol 300 MG tablet Commonly known as:  ZYLOPRIM Take 300 mg by mouth daily.   amLODipine 10 MG tablet Commonly known as:  NORVASC Take 10 mg by mouth at bedtime.   amoxicillin 500 MG capsule Commonly known as:  AMOXIL Take 2,000 mg by mouth See admin instructions. TAKE 4 CAPSULES (2000 MG) BY MOUTH 1 HOUR PRIOR TO DENTAL APPOINTMENTS   aspirin EC 325 MG tablet Take 1 tablet (325 mg total) by mouth daily.   celecoxib 200 MG capsule Commonly known as:  CELEBREX Take 200 mg by mouth 2 (two) times daily.   fluticasone 50 MCG/ACT nasal spray Commonly known as:  FLONASE Place 2 sprays into both nostrils daily as needed. For allergies.   HYDROmorphone 2 MG tablet Commonly known as:  DILAUDID Take 1-2 tablets (2-4 mg total) by mouth every 4 (four) hours as needed for severe pain.   multivitamin with minerals Tabs tablet Take 1 tablet by mouth daily.   ondansetron 4 MG disintegrating tablet Commonly known as:  ZOFRAN ODT Take 1 tablet (4 mg total) by mouth every 8 (eight) hours as needed.   OVER THE COUNTER MEDICATION Take 15 mLs by mouth daily. Balance Oil   propranolol ER 80 MG 24 hr capsule Commonly known as:  INDERAL LA Take 80 mg by mouth at bedtime.    testosterone cypionate 200 MG/ML injection Commonly known as:  DEPOTESTOSTERONE CYPIONATE Inject 100 mg into the muscle every Wednesday.   Ubiquinol 100 MG Caps Take 100 mg by mouth daily.       Diagnostic Studies: Dg Shoulder Left Port  Result Date: 12/08/2016 CLINICAL DATA:  Reversed left shoulder arthroplasty EXAM: LEFT SHOULDER - 1 VIEW COMPARISON:  11/03/2016 FINDINGS: There is no evidence of fracture or dislocation status post reverse shoulder arthroplasty. Postop subcutaneous emphysema is seen about the left shoulder. No malalignment is apparent. Low lung volumes of the adjacent left lung. The adjacent ribs are unremarkable. The [facility] joint is maintained. IMPRESSION: No immediate postoperative abnormalities post reverse left shoulder arthroplasty. Electronically Signed   By: Ashley Royalty M.D.   On: 12/08/2016 17:52    Disposition: 01-Home or Self Care  Discharge Instructions    Call MD / Call 911    Complete by:  As directed    If you experience chest pain or shortness of breath, CALL 911 and be transported to the hospital emergency room.  If you develope a fever above 101 F, pus (white drainage) or increased drainage or redness at the wound, or calf pain, call your surgeon's office.   Constipation Prevention    Complete by:  As directed    Drink plenty of fluids.  Prune juice may be helpful.  You may use a stool softener, such as Colace (over the counter) 100 mg twice a day.  Use MiraLax (over the counter) for constipation as needed.   Diet - low sodium heart healthy    Complete by:  As directed    Discharge instructions    Complete by:  As directed    See Dr. [name]' discharge instructions   Driving restrictions    Complete by:  As directed    No driving for 6 weeks   Increase activity slowly as tolerated    Complete by:  As directed    Lifting restrictions    Complete by:  As directed    No lifting for 6 weeks      Follow-up Information    [name], MD In  2 weeks.   Specialty:  Orthopedic Surgery Why:  For wound re-check Contact information: [address] [place] [address] [place] [address] [phone]            Signed: Elie Goody 12/09/2016, 7:51 PM

## 2016-12-10 NOTE — Progress Notes (Addendum)
This is a late entry for an encounter yesterday am     Subjective:  Patient reports pain as mild to moderate.  Patient states block has not worn off. No pain.  Objective:   VITALS:   Vitals:   12/08/16 1740 12/08/16 2131 12/09/16 0043 12/09/16 0458  BP: 113/81 124/85 121/78 124/76  Pulse: 86 (!) 102 91 82  Resp: 18 18 18 16   Temp: 97.6 F (36.4 C) 99.3 F (37.4 C) 98.9 F (37.2 C) 98.3 F (36.8 C)  TempSrc: Oral Oral Oral Oral  SpO2: 98% 96% 96% 96%  Weight:      Height:        NAD Sling intact Dressing c/d/i Motor: (+) AIN, med, rad, PIN, U, axillary, MC but Elbow flexion weak. Sensation altered due to block. 2+ radial   Lab Results  Component Value Date   WBC 15.7 (H) 12/09/2016   HGB 16.4 12/09/2016   HCT 47.6 12/09/2016   MCV 95.0 12/09/2016   PLT 160 12/09/2016   BMET    Component Value Date/Time   NA 138 12/05/2016 1601   K 3.8 12/05/2016 1601   CL 101 12/05/2016 1601   CO2 29 12/05/2016 1601   GLUCOSE 90 12/05/2016 1601   BUN 16 12/05/2016 1601   CREATININE 1.21 12/05/2016 1601   CALCIUM 9.7 12/05/2016 1601   GFRNONAA >60 12/05/2016 1601   GFRAA >60 12/05/2016 1601     Assessment/Plan: 2 Days Post-Op   Active Problems:   S/P reverse total shoulder arthroplasty, left   Sling at all times except axillary care, hygiene, and pendulums Maintain dressing PO pain control DVT ppx: ASA, early ambulation D/C home today   [name] 12/10/2016, 10:39 AM

## 2016-12-11 ENCOUNTER — Encounter (HOSPITAL_COMMUNITY): Payer: Self-pay | Admitting: Orthopedic Surgery

## 2016-12-11 LAB — HIV ANTIBODY (ROUTINE TESTING W REFLEX): HIV Screen 4th Generation wRfx: NONREACTIVE

## 2016-12-11 NOTE — Op Note (Signed)
12/08/2016  6:09 PM  PATIENT:  Geoffrey West    PRE-OPERATIVE DIAGNOSIS:  Left failed total shoulder arthroplasty  POST-OPERATIVE DIAGNOSIS:  Same  PROCEDURE:  Revision left total shoulder to reverse total shoulder arthroplasty, glenoid and humeral components  SURGEON:  [name], MD  ASSISTANT: April Green, RNFA  ANESTHESIA:   General  ESTIMATED BLOOD LOSS: See anesthesia record  PREOPERATIVE INDICATIONS:  Geoffrey West is a  61 y.o. male with a diagnosis of Left failed total shoulder.   Briefly, he had a total shoulder arthroplasty performed on the left shoulder about 8 years ago at [facility] in [place]. 3 years following that procedure he had an anterior instability with subscapularis tear.  Treatment for that was also managed him in [place] with an open subscapularis repair and retention of his total shoulder components. He did fine for the next 5 years and then about 2-1/2 months ago sustained another anterior shoulder dislocation. He presented to urgent care with a missed the diagnosis initially on plain radiographs. He subsequently followed up with his old orthopedic surgeon in [place] who has since retired. There they noted that he had an anterior dislocation and fairly marketed glenoid wear. There were unable to perform a closed reduction in the office and that surgeon recommended a revision surgery. That group is currently out of his network and therefore he presented to my clinic to take over management. We discussed operative intervention would include conversion of the total shoulder arthroscopy to a reverse shoulder arthroplasty given the chronic instability and subscapularis insufficiency. We did get a preoperative CT scan for preoperative planning which demonstrated fairly impressive superior and anterior glenoid where medial all the way to the base of the coracoid process. He did elect to proceed with surgical intervention given the disability and  constant pain he was experiencing due to the subscap insufficiency and instability of his total shoulder prosthesis. The risks benefits and alternatives were discussed with the patient preoperatively including but not limited to the risks of infection, bleeding, nerve injury, cardiopulmonary complications, the need for revision surgery, dislocation, fracture, brachial plexus palsy, incomplete relief of pain, among others, and the patient was willing to proceed.  OPERATIVE IMPLANTS: Biomet size 9 humeral stem press-fit mini with a 44 mm reverse shoulder arthroplasty tray with a 36 mm +3 liner and a 36 mm glenosphere with a 28 mm baseplate and 4 locking screws and one central nonlocking compression screw. Biomet Stagraft DBM plus: 3 mL  OPERATIVE FINDINGS: Unique aspects of the case were that the humeral side was well fixed without periprostatic fracture or loosening. On the glenoid side the polyethylene liner appears to be loose. There was no gross purulence or signs of infection during the case on the humeral or glenoid side. He did have a small cavitary defect on the glenoid side and inferior of the 3 pegs. There was also noted to be marketed medialization of the glenoid with an asymmetric superior and anterior wear pattern creating some anteversion of the native glenoid. The subscapularis tendon actually was still sutured back to the lesser tuberosity however due to the fairly aggressive medialization of the glenoid and poor tendon quality was very attenuated and not under tension when the humeral joint was reduced. The lateral aspect of the tendon was also very torn and a linear split tear pattern. Otherwise the supraspinatus infraspinatus and teres minor tendons were all found to be intact healthy tendon. He had excellent bone stock on the humeral side and  was able to be a press-fit there with the new stem. On the glenoid side there was good bone centrally and superiorly at the base of coracoid however  anteriorly due to the wear pattern and superiorly along the wear pattern the baseplate was not in full contact with the bone and was augmented with bone allograft.  OPERATIVE PROCEDURE: The patient was brought to the operating room and placed in the supine position. General anesthesia was administered. IV antibiotics were given. A Foley was placed. Time out was performed. The upper extremity was prepped and draped in usual sterile fashion. The patient was in a beachchair position. Deltopectoral approach was carried out. Medial and lateral flaps were elevated utilizing sharp dissection to break up the dermal adhesions from prior surgery to the deltopectoral fascia. Prior surgeon that left Ethibond sutures in the deltopectoral interval to help facilitate finding this plane. Dissection was carried down through the prior operative field to the level of the coracoid and clavipectoral fascia.   Deep retractors were positioned and the deltopectoral interval.  There was no gross purulence encountered.  We next turned our attention to managing subscapularis. The humeral head was found to be dislocated anteriorly and resting between the anterior rim of the glenoid and the base of coracoid process. Subscapularis was in fact intact to lesser tuberosity however was very attenuated and had split tears in the distal aspect of the tendon. This was elevated subperiosteally off of the lesser tuberosity and excised. Care was taken to dissect between the conjoined tendon and the anterior aspect subscapularis.  Axillary nerve was palpated directly and clear of the surgical field at all times.  Within moved to clear scar tissue and synovium away from the base of the humeral component. Using straight osteotomes and a mallet we were able to expose the collar of the humeral implant. A tuning fork-type instrument was used to remove the head ball from the [employer] [facility] taper on the humeral stem. Once this was a competent continued our dissection  with Bovie and sharply with the knife as well as rongeur to expose the superior portion of the humeral stem. A extraction device was implanted onto the humeral stem and was able to be back slapped out of the humerus. Of note there was no proximal bone loss metaphyseal bone loss with this technique. Again we sent cultures of the humeral side for microbiology. There was no gross purulence.  We next turned our attention to the glenoid side. It was noted that the glenoid polyethylene was loose on direct palpation. This was easily extracted with the help of an osteotome to remove it from the center post and the Poly came out in 1 piece.  There was noted to be too small cavitary defects around the corresponding superior and inferior post from the prior polyethylene liner. This was curetted and rongeured. Again there was no gross purulence but some of this material was sent for culture. Was noted to be fairly impressive superior and anterior wear pattern. Anteriorly the wear pattern was all the way to the base of the coracoid. The remaining subscap tendon that was in our visual field was excised.  Deep retractors were placed circumferentially around the glenoid to facilitate adequate exposure. The remaining soft tissue was excised from the glenoid face and a 360 technique. Next we placed a center pin just below the equator of the glenoid. This was noted to be drilled bicortically. Next we used the reaming system to flatten the inferior prominence of the glenoid  as well as posterior prominence to ensure that our baseplate was put in an anatomic position. The central reamer was then placed over the center pin and reamed to the adequate depth. During this process we did note that the inferior 180 of the baseplate reamer was seated very nicely into bone. The superior one half was just touching bone barely posteriorly and anteriorly and superiorly was elevated off the face of the glenoid due to the wear pattern. At this  point we determined that the 28 mm glenoid baseplate is appropriate for his anatomy. This was malleted in place place and had good purchase and fixation noted even prior to screw placement. We then measured our center screw to be 25 mm in length. Of note it was noted that due to the shallow depth of the bolt secondary to his wear pattern the posterior half of the center screw drill hole was blown out. Anteriorly there was adequate bone on palpation with the depth gauge. A 6.5 mm x 25 mm central compression screw was placed by hand. This had moderate bite. Again the baseplate was not loose on direct palpation or with fairly aggressive rocking. Superior and anterior aspect was packed with a cancellous allograft with DBM plus to fill this bony void. We used 3 mL of this graft. Next we proceeded to drill measure and then placed by hand 4 peripheral locking screws into the chronicity or baseplate. I had excellent purchase with my superior, inferior, and posterior screws. I placed a short screw anteriorly into the locking hole.  I then turned my attention to the glenosphere, and impacted this into place, placing slight inferior offset (set on B).  We then used a 36 mm glenoid sphere that was malleted directly onto the [facility] taper. This had good fit.  We then turned our attention back to the humeral side. We first began by freshening up the humeral cut. Using an next medullary guide in 30 of retroversion we freshened the cut by about 4 mm just to the level of the super spinatus insertion. Care was taken to maintain the rotator cuff insertion for postoperative function. We began by reaming the canal sequentially starting with an 8 reamer up to an 11 reamer we had good chatter and the diaphysis. We next turned to the broaching system for the mini humeral stem. We broached up from a 7 to a 9 mm stem which had excellent press-fit into the proximal metaphyseal cancellous bone. This was both rotationally and angularly stable.  We then trialed our components. It was found that a 44 mm humeral tray with a 36+3 offset had the best fit ease of reduction and allow for the best range of motion of the shoulder. The arm at the side I was able to forward elevate to 150, externally rotate 60, and internally rotate 60. In 90 of abduction I was also able to externally rotate 60 and internally rotate 60. The humeral component was dislocated. We removed the trial. The wound was copiously irrigated with normal saline. Final implants were opened for the humeral side. This included a 9 mm mini stem as well as the 36 mm +3 liner. Following reduction we had the same range of motion's that were noted with the trial implants. The wound was once again copiously irrigated. The the deltopectoral interval was closed with 3 interrupted FiberWire stitches for future surgeries if needed.  We then began a layered closure of the skin following another round of irrigation. The deep subcutaneous layer  was closed with 2-0 Monocryl, subcuticular 3-0 Monocryl was used to close the skin, and Dermabond was used for a final layer of closure and to seal the wound. A sterile bandage was applied. The arm was placed in a sling.  The patient tolerated the procedure well and was able to be awoken from general anesthesia without any immediate complications. He was transferred to the recovery room in stable condition.  All counts were correct 2.   Disposition: Nicholson Starace will be nonweightbearing to the left upper extremity and remain in his sling for the next 2 weeks. He may begin pendulums table slides and wall crawls starting on postoperative day #3. We will place him on a once daily aspirin for 1 month for postoperative DVT prophylaxis. He will maintain his postoperative dressing for 2 weeks postoperatively until he returns to clinic to see me. We will admit him into the inpatient floor for postoperative care.

## 2016-12-12 ENCOUNTER — Encounter (HOSPITAL_COMMUNITY): Payer: Self-pay | Admitting: Orthopedic Surgery

## 2016-12-12 DIAGNOSIS — F19982 Other psychoactive substance use, unspecified with psychoactive substance-induced sleep disorder: Secondary | ICD-10-CM | POA: Diagnosis not present

## 2016-12-12 DIAGNOSIS — F1123 Opioid dependence with withdrawal: Secondary | ICD-10-CM | POA: Diagnosis not present

## 2016-12-12 DIAGNOSIS — R030 Elevated blood-pressure reading, without diagnosis of hypertension: Secondary | ICD-10-CM | POA: Diagnosis not present

## 2016-12-13 LAB — AEROBIC/ANAEROBIC CULTURE (SURGICAL/DEEP WOUND)

## 2016-12-14 LAB — AEROBIC/ANAEROBIC CULTURE (SURGICAL/DEEP WOUND)

## 2016-12-14 LAB — AEROBIC/ANAEROBIC CULTURE W GRAM STAIN (SURGICAL/DEEP WOUND)

## 2016-12-15 DIAGNOSIS — Z96612 Presence of left artificial shoulder joint: Secondary | ICD-10-CM | POA: Diagnosis not present

## 2016-12-15 DIAGNOSIS — M25312 Other instability, left shoulder: Secondary | ICD-10-CM | POA: Diagnosis not present

## 2016-12-20 DIAGNOSIS — Z96612 Presence of left artificial shoulder joint: Secondary | ICD-10-CM | POA: Diagnosis not present

## 2016-12-20 DIAGNOSIS — M25312 Other instability, left shoulder: Secondary | ICD-10-CM | POA: Diagnosis not present

## 2016-12-22 ENCOUNTER — Telehealth: Payer: Self-pay | Admitting: Infectious Disease

## 2016-12-22 NOTE — Telephone Encounter (Signed)
Dr. 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 He had referred to our clinic but pt not heard back.  I rcommended in future that for urgent consults like this where pt needs IV abx to call on call provider  I cannot see him today does anyone have any slots next week?  I can also put him on something oral in the meantime if it is going to be longer than that

## 2016-12-22 NOTE — Telephone Encounter (Signed)
Scheduled per Dr Tommy Medal for 7/2 at 2:45. Landis Gandy, RN

## 2016-12-25 ENCOUNTER — Encounter: Payer: Self-pay | Admitting: Infectious Disease

## 2016-12-25 ENCOUNTER — Ambulatory Visit (INDEPENDENT_AMBULATORY_CARE_PROVIDER_SITE_OTHER): Payer: PPO | Admitting: Infectious Disease

## 2016-12-25 VITALS — BP 128/80 | HR 70 | Temp 97.9°F | Ht 71.0 in | Wt 214.0 lb

## 2016-12-25 DIAGNOSIS — I10 Essential (primary) hypertension: Secondary | ICD-10-CM | POA: Diagnosis not present

## 2016-12-25 DIAGNOSIS — T8459XA Infection and inflammatory reaction due to other internal joint prosthesis, initial encounter: Secondary | ICD-10-CM | POA: Insufficient documentation

## 2016-12-25 DIAGNOSIS — Z96619 Presence of unspecified artificial shoulder joint: Secondary | ICD-10-CM | POA: Diagnosis not present

## 2016-12-25 DIAGNOSIS — M1000 Idiopathic gout, unspecified site: Secondary | ICD-10-CM | POA: Diagnosis not present

## 2016-12-25 DIAGNOSIS — R7989 Other specified abnormal findings of blood chemistry: Secondary | ICD-10-CM | POA: Insufficient documentation

## 2016-12-25 DIAGNOSIS — M109 Gout, unspecified: Secondary | ICD-10-CM

## 2016-12-25 DIAGNOSIS — Z96612 Presence of left artificial shoulder joint: Secondary | ICD-10-CM | POA: Diagnosis not present

## 2016-12-25 HISTORY — DX: Other specified abnormal findings of blood chemistry: R79.89

## 2016-12-25 HISTORY — DX: Infection and inflammatory reaction due to other internal joint prosthesis, initial encounter: Z96.619

## 2016-12-25 HISTORY — DX: Infection and inflammatory reaction due to other internal joint prosthesis, initial encounter: T84.59XA

## 2016-12-25 HISTORY — DX: Gout, unspecified: M10.9

## 2016-12-25 HISTORY — DX: Essential (primary) hypertension: I10

## 2016-12-25 MED ORDER — PROPRANOLOL HCL ER 120 MG PO CP24: 120.0000 mg | ORAL_CAPSULE | Freq: Every day | ORAL | 2 refills | Status: DC

## 2016-12-25 MED ORDER — RIFAMPIN 300 MG PO CAPS: 300.0000 mg | ORAL_CAPSULE | Freq: Two times a day (BID) | ORAL | 3 refills | Status: DC

## 2016-12-25 NOTE — Progress Notes (Signed)
HPI: Geoffrey West is a 61 y.o. male who is here to see Dr. Tommy Medal for his shoulder infection.   Allergies: Allergies  Allergen Reactions  . Fentanyl Shortness Of Breath    Reaction to [facility] ONLY > ? DOSE REGULATION ?   [name] Oxycodone Other (See Comments)    Pt states he goes through hot and cold flashes withdrawal like symptoms    Vitals: Temp: 97.9 F (36.6 C) (07/02 1432) Temp Source: Oral (07/02 1432) BP: 128/80 (07/02 1432) Pulse Rate: 70 (07/02 1432)  Past Medical History: Past Medical History:  Diagnosis Date  . Arthritis   . GERD (gastroesophageal reflux disease)   . Gout 12/25/2016  . Hypertension   . Hypertension 12/25/2016  . Low testosterone 12/25/2016  . 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 Marital status: Single    Spouse name: N/A  . Number of children: N/A  . Years of education: N/A   Social History Main Topics  . Smoking status: Never Smoker  . Smokeless tobacco: Never Used  . Alcohol use No  . Drug use: No  . Sexual activity: Not Asked   Other Topics Concern  . None   Social History Narrative  . None    Labs: No results found for: HIV1RNAQUANT, HIV1RNAVL, CD4TABS, HEPBSAB, HEPBSAG, HCVAB  CrCl: Estimated Creatinine Clearance: 77.1 mL/min (by C-G formula based on SCr of 1.21 mg/dL).  Lipids: No results found for: CHOL, TRIG, HDL, CHOLHDL, VLDL, LDLCALC  Assessment: Geoffrey West recently had a failed shoulder surgery and he had a positive culture for CNS and propionobacterium. He was a work in to start abx. Dr. Tommy Medal will start him on Rocephin but will add rifampin for synergy. He pulled me in to discuss any potential interactions with his current therapy. Of all of the meds that he is on, rifampin will likely decrease the level of propranolol that he takes for migraines prophylaxis. Therefore, we will increase his propranolol to 120mg  empirically. Counseled him to watch his BP early on  after the increase.    Recommendations:  Increase propranolol to 120mg  PO qday  Onnie Boer, PharmD, BCPS, AAHIVP, CPP Clinical Infectious Disease [place] [facility] 12/25/2016, 3:58 PM

## 2016-12-25 NOTE — Progress Notes (Signed)
Reason for Consuilt: prosthetic joint infection  Requesting Physician: Victorino December, MD    Subjective:    Patient ID: Geoffrey West, male    DOB: [birth year], 61 y.o.   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 He was taken to OR by Dr. [name] on June 15th and had shoulder reviisino and reversal of total shoulder arthroplasty. 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 He actually denies being in any pain and shoulder feels much better.  We worked him into clinic urgently after phone call from Dr. [name] last week (note initial consult made by referral but he had not been scheduled)  We will work to place PICC line via IR and start him on IV ceftriaxone 2grams daily to cover the P acnes and the Coag Neg Staph. I will add in BID rifampin for potential biofilms from CNS.  His inderal will need to be adjusted due to DDI per Onnie Boer.  Past Medical History:  Diagnosis Date  . Arthritis   . GERD (gastroesophageal reflux disease)   . Gout 12/25/2016  . Hypertension   . Hypertension 12/25/2016  . Low testosterone 12/25/2016  . 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 BACK SURGERY    . COLONOSCOPY    . REPLACEMENT TOTAL KNEE     both knees replacments  . REVERSE SHOULDER ARTHROPLASTY Left 12/08/2016   Procedure: Revision left total shoulder to reverse total shoulder arthroplasty;  Surgeon: [name], MD;  Location: [place];  Service: Orthopedics;  Laterality: Left;  . SHOULDER SURGERY      Family History  Problem Relation Age of Onset  . Arthritis Mother   . Arthritis Sister   . Alzheimer's disease Brother   . Arthritis Brother       Social History   Social History  .  Marital status: Single    Spouse name: N/A  . Number of children: N/A  . Years of education: N/A   Social History Main Topics  . Smoking status: Never Smoker  . Smokeless tobacco: Never Used  . Alcohol use No  . Drug use: No  . Sexual activity: Not Asked   Other Topics Concern  . None   Social History Narrative  . None    Allergies  Allergen Reactions  . Fentanyl Shortness Of Breath    Reaction to [facility] ONLY > ? DOSE REGULATION ?   [name] Oxycodone Other (See Comments)    Pt states he goes through hot and cold flashes withdrawal like symptoms     Current Outpatient Prescriptions:  .  allopurinol (ZYLOPRIM) 300 MG tablet, Take 300 mg by mouth daily., Disp: , Rfl:  .  amLODipine (NORVASC) 10 MG tablet, Take 10 mg by mouth at bedtime. , Disp: , Rfl:  .  amoxicillin (AMOXIL) 500 MG capsule, Take 2,000 mg by mouth See admin instructions. TAKE 4 CAPSULES (2000 MG) BY MOUTH 1 HOUR PRIOR TO DENTAL APPOINTMENTS, Disp: , Rfl: 1 .  aspirin EC 325 MG tablet, Take 1 tablet (325 mg total) by mouth daily., Disp: 100 tablet, Rfl: 3 .  celecoxib (CELEBREX) 200 MG capsule, Take 200 mg by mouth 2 (two) times daily., Disp: , Rfl: 3 .  fluticasone (FLONASE) 50 MCG/ACT nasal  spray, Place 2 sprays into both nostrils daily as needed. For allergies., Disp: , Rfl: 3 .  Misc Natural Products (ADRENAL PO), Take 1 tablet by mouth daily., Disp: , Rfl:  .  Multiple Vitamin (MULTIVITAMIN WITH MINERALS) TABS tablet, Take 1 tablet by mouth daily., Disp: , Rfl:  .  testosterone cypionate (DEPOTESTOSTERONE CYPIONATE) 200 MG/ML injection, Inject 100 mg into the muscle every Wednesday., Disp: , Rfl: 5 .  Ubiquinol 100 MG CAPS, Take 100 mg by mouth daily., Disp: , Rfl:  .  HYDROmorphone (DILAUDID) 2 MG tablet, Take 1-2 tablets (2-4 mg total) by mouth every 4 (four) hours as needed for severe pain. (Patient not taking: Reported on 12/25/2016), Disp: 70 tablet, Rfl: 0 .  ondansetron (ZOFRAN ODT) 4 MG disintegrating tablet,  Take 1 tablet (4 mg total) by mouth every 8 (eight) hours as needed. (Patient not taking: Reported on 12/25/2016), Disp: 20 tablet, Rfl: 0 .  OVER THE COUNTER MEDICATION, Take 15 mLs by mouth daily. Balance Oil, Disp: , Rfl:  .  propranolol ER (INDERAL LA) 120 MG 24 hr capsule, Take 1 capsule (120 mg total) by mouth daily., Disp: 30 capsule, Rfl: 2 .  rifampin (RIFADIN) 300 MG capsule, Take 1 capsule (300 mg total) by mouth 2 (two) times daily., Disp: 60 capsule, Rfl: 3   Review of Systems  Constitutional: Negative for chills and fever.  HENT: Negative for congestion and sore throat.   Eyes: Negative for photophobia.  Respiratory: Negative for cough, shortness of breath and wheezing.   Cardiovascular: Negative for chest pain, palpitations and leg swelling.  Gastrointestinal: Negative for abdominal pain, blood in stool, constipation, diarrhea, nausea and vomiting.  Genitourinary: Negative for dysuria, flank pain and hematuria.  Musculoskeletal: Negative for back pain and myalgias.  Skin: Positive for wound. Negative for rash.  Neurological: Negative for dizziness, weakness and headaches.  Hematological: Does not bruise/bleed easily.  Psychiatric/Behavioral: Negative for suicidal ideas.       Objective:   Physical Exam  Constitutional: He is oriented to person, place, and time. He appears well-developed and well-nourished. No distress.  HENT:  Head: Normocephalic and atraumatic.  Mouth/Throat: No oropharyngeal exudate.  Eyes: Conjunctivae and EOM are normal. No scleral icterus.  Neck: Normal range of motion. Neck supple.  Cardiovascular: Normal rate and regular rhythm.   Pulmonary/Chest: Effort normal. No respiratory distress. He has no wheezes.  Abdominal: He exhibits no distension.  Musculoskeletal: He exhibits no edema or tenderness.  Neurological: He is alert and oriented to person, place, and time. He exhibits normal muscle tone. Coordination normal.  Skin: Skin is warm and dry.  No rash noted. He is not diaphoretic. No erythema. No pallor.  Psychiatric: He has a normal mood and affect. His behavior is normal. Judgment and thought content normal.   Shoulder incision see picture 12/25/16:           Assessment & Plan:   Prosthetic shoulder infecction:  We will ask IR to place PICC and hopefully this will happen promptly with him being in [place]  First dose CTX at short stay  Ceftriaxone 2 grams IV daily  Rifampin 300mg  bid  Treat for 6 weeks, re-evaluate and consider extend with oral therapy further beyond this. The augmentin he had been on recently might be a reasonable choice to target both organisms  Would treat him for 6 months total therapy  Gout: continue allopurinol. There was DDI listed but asked my pharmacist to review  HTN: Inderal dose has  to be increased due to DDI  We spent greater than 60 minutes with the patient including greater than 50% of time in face to face counsel of the patient re  His prosthetic joint infecttion, his gout, HTN, and in coordination of his care with short stay, IR and [place]

## 2016-12-26 ENCOUNTER — Encounter (HOSPITAL_COMMUNITY): Payer: Self-pay | Admitting: Interventional Radiology

## 2016-12-26 ENCOUNTER — Ambulatory Visit (HOSPITAL_COMMUNITY)
Admission: RE | Admit: 2016-12-26 | Discharge: 2016-12-26 | Disposition: A | Discharge status: Home / Self Care | Payer: PPO | Source: Ambulatory Visit | Attending: Infectious Disease | Admitting: Infectious Disease

## 2016-12-26 DIAGNOSIS — Z96612 Presence of left artificial shoulder joint: Secondary | ICD-10-CM | POA: Diagnosis not present

## 2016-12-26 DIAGNOSIS — T8459XA Infection and inflammatory reaction due to other internal joint prosthesis, initial encounter: Secondary | ICD-10-CM | POA: Diagnosis not present

## 2016-12-26 DIAGNOSIS — Z452 Encounter for adjustment and management of vascular access device: Secondary | ICD-10-CM | POA: Diagnosis not present

## 2016-12-26 DIAGNOSIS — Z96619 Presence of unspecified artificial shoulder joint: Secondary | ICD-10-CM | POA: Insufficient documentation

## 2016-12-26 DIAGNOSIS — Y831 Surgical operation with implant of artificial internal device as the cause of abnormal reaction of the patient, or of later complication, without mention of misadventure at the time of the procedure: Secondary | ICD-10-CM | POA: Diagnosis not present

## 2016-12-26 DIAGNOSIS — M25312 Other instability, left shoulder: Secondary | ICD-10-CM | POA: Diagnosis not present

## 2016-12-26 HISTORY — PX: IR FLUORO GUIDE CV LINE RIGHT: IMG2283

## 2016-12-26 MED ORDER — LIDOCAINE HCL (PF) 1 % IJ SOLN
INTRAMUSCULAR | Status: AC
  Filled 2016-12-26: qty 30

## 2016-12-26 MED ORDER — DEXTROSE 5 % IV SOLN
2.0000 g | Freq: Once | INTRAVENOUS | Status: AC
  Administered 2016-12-26: 2 g via INTRAVENOUS
  Filled 2016-12-26: qty 2

## 2016-12-26 MED ORDER — HEPARIN SOD (PORK) LOCK FLUSH 100 UNIT/ML IV SOLN
INTRAVENOUS | Status: AC
  Filled 2016-12-26: qty 5

## 2016-12-26 MED ORDER — LIDOCAINE HCL 1 % IJ SOLN
INTRAMUSCULAR | Status: DC | PRN
  Administered 2016-12-26: 5 mL

## 2016-12-26 NOTE — Progress Notes (Signed)
Pt arrived/ ambulated with nurse from radiology post picc line placement for antibiotics. Vvs.

## 2016-12-26 NOTE — Procedures (Signed)
Interventional Radiology Procedure Note  Procedure: Right UE PICC placement  Complications: None  Estimated Blood Loss: < 10 mL  38 cm SL Power PICC via right brachial vein with tip at SVC/RA junction.  OK to use.  Venetia Night. Kathlene Cote, M.D Pager:  [phone]

## 2016-12-27 ENCOUNTER — Encounter (HOSPITAL_COMMUNITY): Payer: Self-pay

## 2016-12-27 ENCOUNTER — Emergency Department (HOSPITAL_COMMUNITY)
Admission: EM | Admit: 2016-12-27 | Discharge: 2016-12-27 | Disposition: A | Discharge status: Home / Self Care | Payer: PPO | Attending: Emergency Medicine | Admitting: Emergency Medicine

## 2016-12-27 DIAGNOSIS — Z7982 Long term (current) use of aspirin: Secondary | ICD-10-CM | POA: Diagnosis not present

## 2016-12-27 DIAGNOSIS — I10 Essential (primary) hypertension: Secondary | ICD-10-CM | POA: Diagnosis not present

## 2016-12-27 DIAGNOSIS — Z885 Allergy status to narcotic agent status: Secondary | ICD-10-CM | POA: Insufficient documentation

## 2016-12-27 DIAGNOSIS — Z452 Encounter for adjustment and management of vascular access device: Secondary | ICD-10-CM | POA: Insufficient documentation

## 2016-12-27 DIAGNOSIS — Z79899 Other long term (current) drug therapy: Secondary | ICD-10-CM | POA: Insufficient documentation

## 2016-12-27 NOTE — ED Provider Notes (Signed)
[facility] DEPT Provider Note   CSN: [id number] Arrival date & time: 12/27/16  0038     History   Chief Complaint Chief Complaint  Patient presents with  . Vascular Access Problem    HPI Geoffrey ATKERSON is a 61 y.o. male.  Patient had a PICC line placed for IV therapy used to receive 2 g of Rocephin daily for the next 6 weeks due to a shoulder implant infection.  He states that he cannot stand the thought of the PICC line being in his arm.  He is very anxious.  She can't sleep and is requesting it be removed.  He does understand that he will need to make further arrangements for his IV therapy with his physician.      Past Medical History:  Diagnosis Date  . Arthritis   . GERD (gastroesophageal reflux disease)   . Gout 12/25/2016  . Hypertension   . Hypertension 12/25/2016  . Low testosterone 12/25/2016  . 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 Hypertension 12/25/2016  . Low testosterone 12/25/2016  . Gout 12/25/2016  . 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 Chronic pain syndrome 07/05/2013  . Encounter for therapeutic drug monitoring 07/05/2013  . Radiculopathy, lumbosacral region 07/05/2013    Past Surgical History:  Procedure Laterality Date  . BACK SURGERY    . COLONOSCOPY    . IR FLUORO GUIDE CV LINE RIGHT  12/26/2016  . REPLACEMENT TOTAL KNEE     both knees replacments  . REVERSE SHOULDER ARTHROPLASTY Left 12/08/2016   Procedure: Revision left total shoulder to reverse total shoulder arthroplasty;  Surgeon: [name], MD;  Location: [place];  Service: Orthopedics;  Laterality: Left;  . SHOULDER SURGERY         Home Medications    Prior to Admission medications   Medication Sig Start Date End Date Taking? Authorizing Provider  allopurinol (ZYLOPRIM) 300 MG tablet Take 300 mg by mouth daily.    [provider]  amLODipine (NORVASC) 10 MG tablet Take 10 mg by mouth at bedtime.     [provider]  amoxicillin (AMOXIL) 500 MG capsule Take 2,000 mg by mouth See admin instructions. TAKE 4 CAPSULES (2000 MG) BY MOUTH 1 HOUR PRIOR TO DENTAL APPOINTMENTS 08/23/16   [provider]  aspirin EC 325 MG tablet Take 1 tablet (325 mg total) by mouth daily. 12/08/16 01/05/17  [name], MD  celecoxib (CELEBREX) 200 MG capsule Take 200 mg by mouth 2 (two) times daily. 11/06/16   [provider]  fluticasone (FLONASE) 50 MCG/ACT nasal spray Place 2 sprays into both nostrils daily as needed. For allergies. 11/24/16   [provider]  HYDROmorphone (DILAUDID) 2 MG tablet Take 1-2 tablets (2-4 mg total) by mouth every 4 (four) hours as needed for severe pain. Patient not taking: Reported on 12/25/2016 12/08/16 12/08/17  [name], MD  Misc Natural Products (ADRENAL PO) Take 1 tablet by mouth daily.    [provider]  Multiple Vitamin (MULTIVITAMIN WITH MINERALS) TABS tablet Take 1 tablet by mouth daily.    [provider]  ondansetron (ZOFRAN ODT) 4 MG disintegrating tablet Take 1 tablet (4 mg total) by mouth every 8 (eight) hours as needed. Patient not taking: Reported on 12/25/2016 12/08/16   [name], MD  OVER THE COUNTER MEDICATION Take 15 mLs  by mouth daily. Balance Oil    [provider]  propranolol ER (INDERAL LA) 120 MG 24 hr capsule Take 1 capsule (120 mg total) by mouth daily. 12/25/16   Truman Hayward, MD  rifampin (RIFADIN) 300 MG capsule Take 1 capsule (300 mg total) by mouth 2 (two) times daily. 12/25/16   Truman Hayward, MD  testosterone cypionate (DEPOTESTOSTERONE CYPIONATE) 200 MG/ML injection Inject 100 mg into the muscle every Wednesday. 08/30/16   [provider]  Ubiquinol 100 MG CAPS Take 100 mg by mouth daily.    [provider]    Family History Family History  Problem  Relation Age of Onset  . Arthritis Mother   . Arthritis Sister   . Alzheimer's disease Brother   . Arthritis Brother     Social History Social History  Substance Use Topics  . Smoking status: Never Smoker  . Smokeless tobacco: Never Used  . Alcohol use No     Allergies   Fentanyl and Oxycodone   Review of Systems Review of Systems  Neurological: Negative for weakness.  Psychiatric/Behavioral: The patient is nervous/anxious.   All other systems reviewed and are negative.    Physical Exam Updated Vital Signs BP 140/88 (BP Location: Left Arm)   Pulse 71   Temp 98.7 F (37.1 C) (Oral)   Resp 18   SpO2 98%   Physical Exam  Constitutional: He appears well-developed and well-nourished.  HENT:  Head: Normocephalic.  Eyes: Pupils are equal, round, and reactive to light.  Pulmonary/Chest: Effort normal.  Abdominal: Soft.  Musculoskeletal: Normal range of motion.  Neurological: He is alert.  Skin: Skin is warm.  Nursing note and vitals reviewed.    ED Treatments / Results  Labs (all labs ordered are listed, but only abnormal results are displayed) Labs Reviewed - No data to display  EKG  EKG Interpretation None       Radiology Ir Fluoro Guide Cv Line Right  Result Date: 12/26/2016 CLINICAL DATA:  Infected left shoulder prosthesis and need for long-term IV antibiotic therapy. EXAM: PICC LINE PLACEMENT WITH ULTRASOUND AND FLUOROSCOPIC GUIDANCE FLUOROSCOPY TIME:  6 seconds.  3.2 mGy. PROCEDURE: The patient was advised of the possible risks and complications and agreed to undergo the procedure. The patient was then brought to the angiographic suite for the procedure. A time-out was performed prior to initiating the procedure. The right arm was prepped with chlorhexidine, draped in the usual sterile fashion using maximum barrier technique (cap and mask, sterile gown, sterile gloves, large sterile sheet, hand hygiene and cutaneous antisepsis) and infiltrated locally  with 1% Lidocaine. Ultrasound demonstrated patency of the right brachial vein, and this was documented with an image. Under real-time ultrasound guidance, this vein was accessed with a 21 gauge micropuncture needle and image documentation was performed. A 0.018 wire was introduced in to the vein. Over this, a 5.0 [place] single lumen power injectable PICC was advanced to the lower SVC/right atrial junction. Fluoroscopy during the procedure and fluoro spot radiograph confirms appropriate catheter position. The catheter was flushed and covered with a sterile dressing. Catheter length: 38 cm COMPLICATIONS: None IMPRESSION: Successful right arm power injectable PICC line placement with ultrasound and fluoroscopic guidance. The catheter is ready for use. Electronically Signed   By: Aletta Edouard M.D.   On: 12/26/2016 17:13    Procedures Procedures (including critical care time)  Medications Ordered in ED Medications - No data to display   Initial Impression / Assessment  and Plan / ED Course  I have reviewed the triage vital signs and the nursing notes.  Pertinent labs & imaging results that were available during my care of the patient were reviewed by me and considered in my medical decision making (see chart for details).      I discussed at length having the PICC line removed.  Patient is adamant that he cannot rest knowing that this line is in his arm extending up into his heart.  He understands the ramifications and time constraints that will be imposed on him to get daily IV injections or IM injections of Rocephin.  He is willing to make that sacrifice  Final Clinical Impressions(s) / ED Diagnoses   Final diagnoses:  PIC line (peripherally inserted central catheter) removal    New Prescriptions New Prescriptions   No medications on file     Junius Creamer, NP 12/27/16 [id number]    Veryl Speak, MD 12/27/16 [phone]

## 2016-12-27 NOTE — Progress Notes (Signed)
[facility]  Notified by Dagoberto Ligas, PT, [place], pt was seen in [facility] ED last PM requesting PICC line be removed.  See ED visit note.  Call to Dr. Carlyle Basques with [facility] and apprised of pt situation.  She will work with [facility] office team tomorrow when they are open to check on prior auth for po options due to [place] for pt.  [facility] will be on standby if needed for further home IV ABX/support.  If patient discharges after hours, please call [phone].   Larry Sierras 12/27/2016, 11:56 AM

## 2016-12-27 NOTE — Discharge Instructions (Signed)
Please call your orthopedist or primary care physician whoever ordered the PICC line to report that it was removed tonight to make other arrangements for you are antibiotic therapy

## 2016-12-27 NOTE — ED Triage Notes (Signed)
Pt is requesting his PICC line to his right brachial to be removed. He had the PICC line placed July 3rd for abx status post left shoulder replacement.  Pain is 2/10 but he reports the pain isnt what is the problem. He states "its just a weird feeling, hard to explain, I don't like it. I want it out. Ill find another way."

## 2016-12-28 ENCOUNTER — Telehealth: Payer: Self-pay | Admitting: Infectious Disease

## 2016-12-28 MED ORDER — LINEZOLID 600 MG PO TABS: 600.0000 mg | ORAL_TABLET | Freq: Two times a day (BID) | ORAL | 1 refills | Status: DC

## 2016-12-28 NOTE — Telephone Encounter (Signed)
NO problem. I am sorry the PICC was a bit of a shock to his system!

## 2016-12-28 NOTE — Telephone Encounter (Signed)
Patient called to find out what to do now about medication. After review of the chart advised him [facility] and he is to take the Zyvox and riphampin. The patient would like for Dr Tommy Medal to give him a call to let him know what he should do. Advised will ask the provider and give him a call back.

## 2016-12-28 NOTE — Telephone Encounter (Signed)
I called him. He will start zyvox and rifampin. He needs to come in 2 weeks for lab visit and have CMP and CBC w diff checked then again in 2 weeks after that

## 2016-12-28 NOTE — Telephone Encounter (Signed)
Thank you for the update and your help with this patient!

## 2016-12-28 NOTE — Telephone Encounter (Signed)
Patient apparently did not like having PICC line and came to ER where was removed yesterday we can try to treat him with Zyvox and rifampin until his platelets drop and then change him to Augmentin and rifampin.

## 2017-01-01 DIAGNOSIS — M25312 Other instability, left shoulder: Secondary | ICD-10-CM | POA: Diagnosis not present

## 2017-01-01 DIAGNOSIS — Z96612 Presence of left artificial shoulder joint: Secondary | ICD-10-CM | POA: Diagnosis not present

## 2017-01-04 DIAGNOSIS — Z96612 Presence of left artificial shoulder joint: Secondary | ICD-10-CM | POA: Diagnosis not present

## 2017-01-04 DIAGNOSIS — M25312 Other instability, left shoulder: Secondary | ICD-10-CM | POA: Diagnosis not present

## 2017-01-09 DIAGNOSIS — M25312 Other instability, left shoulder: Secondary | ICD-10-CM | POA: Diagnosis not present

## 2017-01-09 DIAGNOSIS — Z96612 Presence of left artificial shoulder joint: Secondary | ICD-10-CM | POA: Diagnosis not present

## 2017-01-12 DIAGNOSIS — Z96612 Presence of left artificial shoulder joint: Secondary | ICD-10-CM | POA: Diagnosis not present

## 2017-01-12 DIAGNOSIS — M25312 Other instability, left shoulder: Secondary | ICD-10-CM | POA: Diagnosis not present

## 2017-01-15 ENCOUNTER — Telehealth: Payer: Self-pay | Admitting: *Deleted

## 2017-01-15 NOTE — Telephone Encounter (Signed)
Elevated blood pressure/headache after starting oral ABX reported by patient.  Patient feels the oral antibiotics are causing the headache and increased blood pressure.  RN consulted with [facility] pharmacist, Magda Kiel, PharmD about the interaction of patient antibiotics and blood pressure medications.  Thelma Comp, PharmD stated that the interaction was a minor one and should not cause headache and increase blood pressure.  C. Kuppelweiser recommended that the patient be checked by his primary care physician.  RN saw in Dr. Lucianne Lei Dam's last note that the patient needed to come for follow-up blood work after starting the rifampin and Zyvox.  After talking with the patient about the Pharm D's recommendation a follow up lab appointment made for the patient per Dr. Drucilla Schmidt.

## 2017-01-16 ENCOUNTER — Other Ambulatory Visit: Payer: PPO

## 2017-01-16 ENCOUNTER — Other Ambulatory Visit: Payer: Self-pay | Admitting: Pharmacist Clinician (PhC)/ Clinical Pharmacy Specialist

## 2017-01-16 ENCOUNTER — Ambulatory Visit: Payer: PPO

## 2017-01-16 VITALS — BP 163/96

## 2017-01-16 DIAGNOSIS — Z96619 Presence of unspecified artificial shoulder joint: Secondary | ICD-10-CM

## 2017-01-16 DIAGNOSIS — T8459XA Infection and inflammatory reaction due to other internal joint prosthesis, initial encounter: Secondary | ICD-10-CM

## 2017-01-16 DIAGNOSIS — R030 Elevated blood-pressure reading, without diagnosis of hypertension: Secondary | ICD-10-CM

## 2017-01-16 LAB — CBC WITH DIFFERENTIAL/PLATELET
BASOS PCT: 0 %
Basophils Absolute: 0 cells/uL (ref 0–200)
EOS ABS: 245 {cells}/uL (ref 15–500)
Eosinophils Relative: 5 %
HCT: 52.2 % — ABNORMAL HIGH (ref 38.5–50.0)
Hemoglobin: 18 g/dL — ABNORMAL HIGH (ref 13.2–17.1)
LYMPHS PCT: 25 %
Lymphs Abs: 1225 cells/uL (ref 850–3900)
MCH: 33 pg (ref 27.0–33.0)
MCHC: 34.5 g/dL (ref 32.0–36.0)
MCV: 95.8 fL (ref 80.0–100.0)
MONOS PCT: 8 %
MPV: 10.4 fL (ref 7.5–12.5)
Monocytes Absolute: 392 cells/uL (ref 200–950)
Neutro Abs: 3038 cells/uL (ref 1500–7800)
Neutrophils Relative %: 62 %
PLATELETS: 168 10*3/uL (ref 140–400)
RBC: 5.45 MIL/uL (ref 4.20–5.80)
RDW: 15 % (ref 11.0–15.0)
WBC: 4.9 10*3/uL (ref 3.8–10.8)

## 2017-01-16 NOTE — Progress Notes (Unsigned)
Patient here to have BP check d/t having concern about dull headaches that he feels started when he began his antibiotic regime. BP=163/96. Complaining of headache at this time. Consulted with pharmiacist Dr. Lonna Duval about symptoms. Dr. Lonna Duval in to see and consult with patient.

## 2017-01-17 DIAGNOSIS — M25312 Other instability, left shoulder: Secondary | ICD-10-CM | POA: Diagnosis not present

## 2017-01-17 DIAGNOSIS — Z96612 Presence of left artificial shoulder joint: Secondary | ICD-10-CM | POA: Diagnosis not present

## 2017-01-17 LAB — COMPREHENSIVE METABOLIC PANEL
ALK PHOS: 88 U/L (ref 40–115)
ALT: 23 U/L (ref 9–46)
AST: 22 U/L (ref 10–35)
Albumin: 4.2 g/dL (ref 3.6–5.1)
BUN: 18 mg/dL (ref 7–25)
CHLORIDE: 98 mmol/L (ref 98–110)
CO2: 26 mmol/L (ref 20–31)
Calcium: 9.7 mg/dL (ref 8.6–10.3)
Creat: 1.23 mg/dL (ref 0.70–1.25)
GLUCOSE: 125 mg/dL — AB (ref 65–99)
POTASSIUM: 3.8 mmol/L (ref 3.5–5.3)
Sodium: 137 mmol/L (ref 135–146)
Total Bilirubin: 0.7 mg/dL (ref 0.2–1.2)
Total Protein: 6.8 g/dL (ref 6.1–8.1)

## 2017-01-18 DIAGNOSIS — G43909 Migraine, unspecified, not intractable, without status migrainosus: Secondary | ICD-10-CM | POA: Diagnosis not present

## 2017-01-19 DIAGNOSIS — Z96612 Presence of left artificial shoulder joint: Secondary | ICD-10-CM | POA: Diagnosis not present

## 2017-01-19 NOTE — Telephone Encounter (Signed)
Patient went to urgent care yesterday and received migraine cocktail. BP in 140s/90s. Started new BP. Today he feels better

## 2017-01-23 DIAGNOSIS — M25312 Other instability, left shoulder: Secondary | ICD-10-CM | POA: Diagnosis not present

## 2017-01-23 DIAGNOSIS — Z96612 Presence of left artificial shoulder joint: Secondary | ICD-10-CM | POA: Diagnosis not present

## 2017-01-24 ENCOUNTER — Other Ambulatory Visit: Payer: Self-pay | Admitting: Pharmacist

## 2017-01-24 ENCOUNTER — Telehealth: Payer: Self-pay | Admitting: *Deleted

## 2017-01-24 ENCOUNTER — Telehealth: Payer: Self-pay | Admitting: Pharmacist

## 2017-01-24 DIAGNOSIS — T8459XD Infection and inflammatory reaction due to other internal joint prosthesis, subsequent encounter: Secondary | ICD-10-CM

## 2017-01-24 DIAGNOSIS — Z96619 Presence of unspecified artificial shoulder joint: Principal | ICD-10-CM

## 2017-01-24 MED ORDER — LINEZOLID 600 MG PO TABS: 600.0000 mg | ORAL_TABLET | Freq: Two times a day (BID) | ORAL | 0 refills | Status: DC

## 2017-01-24 NOTE — Telephone Encounter (Signed)
Patient walked into clinic, asked to speak with nurse regarding lab results, upcoming appointments, medication management. His new blood pressure regimen is working better, he has not had a migraine in 4 days. RN spoke with Dr Tommy Medal.  Patient will come 8/14 for labs (cmp, cbc diff, esr, crp), will follow up as previously scheduled 9/6 with Dr Tommy Medal.  Patient will continue zyvox for a total of 6 weeks (he will need 2 more weeks' refill).  His copay was $300, he has 2 pills left.  Cassie will get him the refill with the manufacturer's assistance program to make this more affordable.   After zyvox is complete, patient will switch to augmentin.  Patient in agreement with this plan. Landis Gandy, RN

## 2017-01-24 NOTE — Telephone Encounter (Signed)
Geoffrey West. I dont think he understood the IV abx part from before very well

## 2017-01-24 NOTE — Telephone Encounter (Signed)
I was able to find a coupon online for Geoffrey West's Linezolid.  I sent in a 2 week supply for him and called [facility] and gave [facility] the coupon information.  His cost went from $297 to $1. He was very happy about that.  I wish I could have helped him with his first month, but he is grateful that he only has to pay the $1 now. Told him to call if he needs anything else.

## 2017-01-30 DIAGNOSIS — M25312 Other instability, left shoulder: Secondary | ICD-10-CM | POA: Diagnosis not present

## 2017-01-30 DIAGNOSIS — Z96612 Presence of left artificial shoulder joint: Secondary | ICD-10-CM | POA: Diagnosis not present

## 2017-02-02 DIAGNOSIS — E291 Testicular hypofunction: Secondary | ICD-10-CM | POA: Diagnosis not present

## 2017-02-06 ENCOUNTER — Other Ambulatory Visit: Payer: PPO

## 2017-02-06 ENCOUNTER — Other Ambulatory Visit: Payer: Self-pay | Admitting: Pharmacist

## 2017-02-06 ENCOUNTER — Telehealth: Payer: Self-pay | Admitting: Pharmacist

## 2017-02-06 DIAGNOSIS — Z96619 Presence of unspecified artificial shoulder joint: Principal | ICD-10-CM

## 2017-02-06 DIAGNOSIS — T8459XD Infection and inflammatory reaction due to other internal joint prosthesis, subsequent encounter: Secondary | ICD-10-CM

## 2017-02-06 DIAGNOSIS — T8459XA Infection and inflammatory reaction due to other internal joint prosthesis, initial encounter: Secondary | ICD-10-CM

## 2017-02-06 LAB — CBC WITH DIFFERENTIAL/PLATELET
BASOS ABS: 35 {cells}/uL (ref 0–200)
Basophils Relative: 1 %
EOS ABS: 280 {cells}/uL (ref 15–500)
EOS PCT: 8 %
HEMATOCRIT: 49.3 % (ref 38.5–50.0)
HEMOGLOBIN: 17 g/dL (ref 13.2–17.1)
LYMPHS ABS: 1260 {cells}/uL (ref 850–3900)
Lymphocytes Relative: 36 %
MCH: 33.3 pg — AB (ref 27.0–33.0)
MCHC: 34.5 g/dL (ref 32.0–36.0)
MCV: 96.7 fL (ref 80.0–100.0)
MONO ABS: 350 {cells}/uL (ref 200–950)
MPV: 11.3 fL (ref 7.5–12.5)
Monocytes Relative: 10 %
NEUTROS PCT: 45 %
Neutro Abs: 1575 cells/uL (ref 1500–7800)
Platelets: 146 10*3/uL (ref 140–400)
RBC: 5.1 MIL/uL (ref 4.20–5.80)
RDW: 15.7 % — ABNORMAL HIGH (ref 11.0–15.0)
WBC: 3.5 10*3/uL — ABNORMAL LOW (ref 3.8–10.8)

## 2017-02-06 MED ORDER — AMOXICILLIN-POT CLAVULANATE 875-125 MG PO TABS: 1.0000 | ORAL_TABLET | Freq: Two times a day (BID) | ORAL | 3 refills | Status: DC

## 2017-02-06 NOTE — Telephone Encounter (Signed)
thanks so much Cassie

## 2017-02-06 NOTE — Telephone Encounter (Signed)
After discussion with Dr. Tommy Medal, I sent in Augmentin x 4 months for [facility].  He is to take it BID alone (no rifampin anymore). I called Tommy and let him know.  He follows up with Dr. Tommy Medal in early September.

## 2017-02-07 DIAGNOSIS — M2550 Pain in unspecified joint: Secondary | ICD-10-CM | POA: Diagnosis not present

## 2017-02-07 DIAGNOSIS — E291 Testicular hypofunction: Secondary | ICD-10-CM | POA: Diagnosis not present

## 2017-02-07 DIAGNOSIS — G479 Sleep disorder, unspecified: Secondary | ICD-10-CM | POA: Diagnosis not present

## 2017-02-07 LAB — COMPLETE METABOLIC PANEL WITH GFR
ALBUMIN: 4 g/dL (ref 3.6–5.1)
ALK PHOS: 75 U/L (ref 40–115)
ALT: 19 U/L (ref 9–46)
AST: 21 U/L (ref 10–35)
BILIRUBIN TOTAL: 0.6 mg/dL (ref 0.2–1.2)
BUN: 19 mg/dL (ref 7–25)
CALCIUM: 9 mg/dL (ref 8.6–10.3)
CO2: 25 mmol/L (ref 20–32)
Chloride: 102 mmol/L (ref 98–110)
Creat: 1.24 mg/dL (ref 0.70–1.25)
GFR, EST NON AFRICAN AMERICAN: 63 mL/min (ref >=60)
GFR, Est African American: 73 mL/min (ref >=60)
Glucose, Bld: 158 mg/dL — ABNORMAL HIGH (ref 65–99)
POTASSIUM: 3.7 mmol/L (ref 3.5–5.3)
Sodium: 140 mmol/L (ref 135–146)
TOTAL PROTEIN: 6.3 g/dL (ref 6.1–8.1)

## 2017-02-07 LAB — SEDIMENTATION RATE: SED RATE: 1 mm/h (ref 0–20)

## 2017-03-01 ENCOUNTER — Encounter: Payer: Self-pay | Admitting: Infectious Disease

## 2017-03-01 ENCOUNTER — Ambulatory Visit (INDEPENDENT_AMBULATORY_CARE_PROVIDER_SITE_OTHER): Payer: PPO | Admitting: Infectious Disease

## 2017-03-01 VITALS — BP 137/88 | HR 79 | Temp 97.4°F | Wt 217.0 lb

## 2017-03-01 DIAGNOSIS — Z96612 Presence of left artificial shoulder joint: Secondary | ICD-10-CM

## 2017-03-01 DIAGNOSIS — Z96619 Presence of unspecified artificial shoulder joint: Secondary | ICD-10-CM | POA: Diagnosis not present

## 2017-03-01 DIAGNOSIS — T8459XD Infection and inflammatory reaction due to other internal joint prosthesis, subsequent encounter: Secondary | ICD-10-CM

## 2017-03-01 NOTE — Progress Notes (Signed)
    Subjective:    Chief complaint: Follow-up for prosthetic joint infection    Patient ID: Geoffrey West, male    DOB: 08/27/1955, 61 y.o.   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 He was taken to OR by Dr. Rogers on June 15th and had shoulder reviisino and reversal of total shoulder arthroplasty. 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 He actually denies being in any pain and shoulder feels much better.  We worked him into clinic urgently after phone call from Dr. Rogers last week (note initial consult made by referral but he had not been scheduled)   We worked to place  IV ceftriaxone 2grams daily to cover the P acnes and the Coag Neg Staph plus rifampin 300 mg BID.  Within a few days of having the PICC line placed to his adamant about not wanting it in and was removed he was then placed on Zyvox and rifampin but had trouble tolerating this regimen as well in particular the rifampin which we then discontinued and ultimately transitioned him to Augmentin. I would like him to have 6 months of therapy and emphasized that we should see him again in January which would be the six-month mark of treatment of his prosthetic joint infection.  He claims to have no pain in his left prosthetic shoulder whatsoever.   Past Medical History:  Diagnosis Date  . Arthritis   . GERD (gastroesophageal reflux disease)   . Gout 12/25/2016  . Hypertension   . Hypertension 12/25/2016  . Low testosterone 12/25/2016  . 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 BACK SURGERY    . COLONOSCOPY    . IR FLUORO GUIDE CV LINE RIGHT  12/26/2016  . REPLACEMENT TOTAL KNEE     both knees replacments  . REVERSE SHOULDER  ARTHROPLASTY Left 12/08/2016   Procedure: Revision left total shoulder to reverse total shoulder arthroplasty;  Surgeon: Rogers, Jason Patrick, MD;  Location: [facility];  Service: Orthopedics;  Laterality: Left;  . SHOULDER SURGERY      Family History  Problem Relation Age of Onset  . Arthritis Mother   . Arthritis Sister   . Alzheimer's disease Brother   . Arthritis Brother       Social History   Social History  . Marital status: Single    Spouse name: N/A  . Number of children: N/A  . Years of education: N/A   Social History Main Topics  . Smoking status: Never Smoker  . Smokeless tobacco: Never Used  . Alcohol use No  . Drug use: No  . Sexual activity: Not Asked   Other Topics Concern  . None   Social History Narrative  . None    Allergies  Allergen Reactions  . Fentanyl Shortness Of Breath    Reaction to P[facility] ONLY > ? DOSE REGULATION ?   . Oxycodone Other (See Comments)    Pt states he goes through hot and cold flashes withdrawal like symptoms     Current Outpatient Prescriptions:  .  allopurinol (ZYLOPRIM) 300 MG tablet, Take 300 mg by mouth daily., Disp: , Rfl:  .  amLODipine (NORVASC) 10 MG tablet, Take 10 mg by mouth at bedtime. , Disp: , Rfl:  .    amoxicillin-clavulanate (AUGMENTIN) 875-125 MG tablet, Take 1 tablet by mouth 2 (two) times daily., Disp: 60 tablet, Rfl: 3 .  celecoxib (CELEBREX) 200 MG capsule, Take 200 mg by mouth 2 (two) times daily., Disp: , Rfl: 3 .  fluticasone (FLONASE) 50 MCG/ACT nasal spray, Place 2 sprays into both nostrils daily as needed. For allergies., Disp: , Rfl: 3 .  hydrochlorothiazide (MICROZIDE) 12.5 MG capsule, Take 12.5 mg by mouth daily., Disp: , Rfl:  .  Misc Natural Products (ADRENAL PO), Take 1 tablet by mouth daily., Disp: , Rfl:  .  Multiple Vitamin (MULTIVITAMIN WITH MINERALS) TABS tablet, Take 1 tablet by mouth daily., Disp: , Rfl:  .  OVER THE COUNTER MEDICATION, Take 15 mLs by mouth daily. Balance Oil, Disp: ,  Rfl:  .  propranolol ER (INDERAL LA) 120 MG 24 hr capsule, Take 1 capsule (120 mg total) by mouth daily., Disp: 30 capsule, Rfl: 2 .  testosterone cypionate (DEPOTESTOSTERONE CYPIONATE) 200 MG/ML injection, Inject 100 mg into the muscle every Wednesday., Disp: , Rfl: 5 .  Ubiquinol 100 MG CAPS, Take 100 mg by mouth daily., Disp: , Rfl:    Review of Systems  Constitutional: Negative for chills and fever.  HENT: Negative for congestion and sore throat.   Eyes: Negative for photophobia.  Respiratory: Negative for cough, shortness of breath and wheezing.   Cardiovascular: Negative for chest pain, palpitations and leg swelling.  Gastrointestinal: Negative for abdominal pain, blood in stool, constipation, diarrhea, nausea and vomiting.  Genitourinary: Negative for dysuria, flank pain and hematuria.  Musculoskeletal: Negative for back pain and myalgias.  Skin: Positive for wound. Negative for rash.  Neurological: Negative for dizziness, weakness and headaches.  Hematological: Does not bruise/bleed easily.  Psychiatric/Behavioral: Negative for suicidal ideas.       Objective:   Physical Exam  Constitutional: He is oriented to person, place, and time. He appears well-developed and well-nourished. No distress.  HENT:  Head: Normocephalic and atraumatic.  Mouth/Throat: No oropharyngeal exudate.  Eyes: Conjunctivae and EOM are normal. No scleral icterus.  Neck: Normal range of motion. Neck supple.  Cardiovascular: Normal rate and regular rhythm.   Pulmonary/Chest: Effort normal. No respiratory distress. He has no wheezes.  Abdominal: He exhibits no distension.  Musculoskeletal: He exhibits no edema or tenderness.  Neurological: He is alert and oriented to person, place, and time. He exhibits normal muscle tone. Coordination normal.  Skin: Skin is warm and dry. No rash noted. He is not diaphoretic. No erythema. No pallor.  Psychiatric: He has a normal mood and affect. His behavior is normal.  Judgment and thought content normal.   Shoulder incision see picture 12/25/16:           Assessment & Plan:   Prosthetic shoulder infecction:   Would treat him for 6 months total therapy which would end in January, check ESR, CRP today

## 2017-03-02 LAB — BASIC METABOLIC PANEL WITH GFR
BUN: 17 mg/dL (ref 7–25)
CO2: 29 mmol/L (ref 20–32)
CREATININE: 1.25 mg/dL (ref 0.70–1.25)
Calcium: 9.6 mg/dL (ref 8.6–10.3)
Chloride: 98 mmol/L (ref 98–110)
GFR, EST NON AFRICAN AMERICAN: 62 mL/min/{1.73_m2} (ref >=60)
GFR, Est African American: 72 mL/min/{1.73_m2} (ref >=60)
GLUCOSE: 124 mg/dL — AB (ref 65–99)
Potassium: 3.6 mmol/L (ref 3.5–5.3)
SODIUM: 138 mmol/L (ref 135–146)

## 2017-03-02 LAB — C-REACTIVE PROTEIN: CRP: 0.8 mg/L (ref <8.0)

## 2017-03-02 LAB — SEDIMENTATION RATE: SED RATE: 2 mm/h (ref 0–20)

## 2017-03-16 DIAGNOSIS — M5136 Other intervertebral disc degeneration, lumbar region: Secondary | ICD-10-CM | POA: Diagnosis not present

## 2017-03-16 DIAGNOSIS — Z96612 Presence of left artificial shoulder joint: Secondary | ICD-10-CM | POA: Diagnosis not present

## 2017-04-04 DIAGNOSIS — H4322 Crystalline deposits in vitreous body, left eye: Secondary | ICD-10-CM | POA: Diagnosis not present

## 2017-04-04 DIAGNOSIS — H43813 Vitreous degeneration, bilateral: Secondary | ICD-10-CM | POA: Diagnosis not present

## 2017-04-04 DIAGNOSIS — H4312 Vitreous hemorrhage, left eye: Secondary | ICD-10-CM | POA: Diagnosis not present

## 2017-04-04 DIAGNOSIS — H43393 Other vitreous opacities, bilateral: Secondary | ICD-10-CM | POA: Diagnosis not present

## 2017-05-15 DIAGNOSIS — H4312 Vitreous hemorrhage, left eye: Secondary | ICD-10-CM | POA: Diagnosis not present

## 2017-05-15 DIAGNOSIS — H43393 Other vitreous opacities, bilateral: Secondary | ICD-10-CM | POA: Diagnosis not present

## 2017-05-15 DIAGNOSIS — H43813 Vitreous degeneration, bilateral: Secondary | ICD-10-CM | POA: Diagnosis not present

## 2017-05-15 DIAGNOSIS — H4322 Crystalline deposits in vitreous body, left eye: Secondary | ICD-10-CM | POA: Diagnosis not present

## 2017-06-12 DIAGNOSIS — R310 Gross hematuria: Secondary | ICD-10-CM | POA: Diagnosis not present

## 2017-06-20 DIAGNOSIS — D582 Other hemoglobinopathies: Secondary | ICD-10-CM | POA: Diagnosis not present

## 2017-06-20 DIAGNOSIS — R310 Gross hematuria: Secondary | ICD-10-CM | POA: Diagnosis not present

## 2017-06-24 ENCOUNTER — Other Ambulatory Visit: Payer: Self-pay | Admitting: Pharmacist

## 2017-06-24 DIAGNOSIS — Z96619 Presence of unspecified artificial shoulder joint: Principal | ICD-10-CM

## 2017-06-24 DIAGNOSIS — T8459XA Infection and inflammatory reaction due to other internal joint prosthesis, initial encounter: Secondary | ICD-10-CM

## 2017-07-18 ENCOUNTER — Encounter: Payer: Self-pay | Admitting: Infectious Disease

## 2017-07-18 ENCOUNTER — Ambulatory Visit (INDEPENDENT_AMBULATORY_CARE_PROVIDER_SITE_OTHER): Payer: Medicare Other | Admitting: Infectious Disease

## 2017-07-18 VITALS — BP 146/93 | HR 60 | Temp 97.7°F | Ht 71.0 in | Wt 218.0 lb

## 2017-07-18 DIAGNOSIS — T8459XD Infection and inflammatory reaction due to other internal joint prosthesis, subsequent encounter: Secondary | ICD-10-CM

## 2017-07-18 DIAGNOSIS — Z96612 Presence of left artificial shoulder joint: Secondary | ICD-10-CM | POA: Diagnosis not present

## 2017-07-18 DIAGNOSIS — Z96619 Presence of unspecified artificial shoulder joint: Secondary | ICD-10-CM | POA: Diagnosis not present

## 2017-07-18 NOTE — Progress Notes (Signed)
Subjective:    Chief complaint: Follow-up for prosthetic joint infection    Patient ID: Geoffrey West, male    DOB: [birth year], 62 y.o.   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 He was taken to OR by Dr. [name] on June 15th and had shoulder reviisino and reversal of total shoulder arthroplasty. 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 He actually denies being in any pain and shoulder feels much better.  We worked him into clinic urgently after phone call from Dr. [name] last week (note initial consult made by referral but he had not been scheduled)   We worked to place  IV ceftriaxone 2grams daily to cover the P acnes and the Coag Neg Staph plus rifampin 300 mg BID.  Within a few days of having the PICC line placed to his adamant about not wanting it in and was removed he was then placed on Zyvox and rifampin but had trouble tolerating this regimen as well in particular the rifampin which we then discontinued and ultimately transitioned him to Augmentin. I planned him  to have 6 months of therapy and emphasized that we should see him again in January 2019 which would be the six-month mark of treatment of his prosthetic joint infection.  Today he returns for his January visit  He claims to have no pain in his left prosthetic shoulder whatsoever. In fact he states that he NEVER had pain here ever when infection was discovered.   Past Medical History:  Diagnosis Date  . Arthritis   . GERD (gastroesophageal reflux disease)   . Gout 12/25/2016  . Hypertension   . Hypertension 12/25/2016  . Low testosterone 12/25/2016  . Prosthetic shoulder infection ([place]) 12/25/2016    Past Surgical History:  Procedure Laterality Date  . BACK SURGERY    .  COLONOSCOPY    . IR FLUORO GUIDE CV LINE RIGHT  12/26/2016  . REPLACEMENT TOTAL KNEE     both knees replacments  . REVERSE SHOULDER ARTHROPLASTY Left 12/08/2016   Procedure: Revision left total shoulder to reverse total shoulder arthroplasty;  Surgeon: [name], MD;  Location: [facility];  Service: Orthopedics;  Laterality: Left;  . SHOULDER SURGERY      Family History  Problem Relation Age of Onset  . Arthritis Mother   . Arthritis Sister   . Alzheimer's disease Brother   . Arthritis Brother       Social History   Socioeconomic History  . Marital status: Single    Spouse name: Not on file  . Number of children: Not on file  . Years of education: Not on file  . Highest education level: Not on file  Social Needs  . Financial resource strain: Not on file  . Food insecurity - worry: Not on file  . Food insecurity - inability: Not on file  . Transportation needs - medical: Not on file  . Transportation needs - non-medical: Not on file  Occupational History  . Not on file  Tobacco Use  . Smoking status: Never Smoker  . Smokeless tobacco: Never Used  Substance and Sexual Activity  . Alcohol use: No  . Drug use: No  . Sexual activity: Not on file  Other Topics Concern  . Not on file  Social  History Narrative  . Not on file    Allergies  Allergen Reactions  . Fentanyl Shortness Of Breath    Reaction to [facility] ONLY > ? DOSE REGULATION ?   [name] Oxycodone Other (See Comments)    Pt states he goes through hot and cold flashes withdrawal like symptoms     Current Outpatient Medications:  .  allopurinol (ZYLOPRIM) 300 MG tablet, Take 300 mg by mouth daily., Disp: , Rfl:  .  amLODipine (NORVASC) 10 MG tablet, Take 10 mg by mouth at bedtime. , Disp: , Rfl:  .  amoxicillin-clavulanate (AUGMENTIN) 875-125 MG tablet, TAKE 1 TABLET BY MOUTH TWICE A DAY, Disp: 60 tablet, Rfl: 0 .  celecoxib (CELEBREX) 200 MG capsule, Take 200 mg by mouth 2 (two) times daily., Disp: , Rfl:  3 .  fluticasone (FLONASE) 50 MCG/ACT nasal spray, Place 2 sprays into both nostrils daily as needed. For allergies., Disp: , Rfl: 3 .  hydrochlorothiazide (MICROZIDE) 12.5 MG capsule, Take 12.5 mg by mouth daily., Disp: , Rfl:  .  Misc Natural Products (ADRENAL PO), Take 1 tablet by mouth daily., Disp: , Rfl:  .  Multiple Vitamin (MULTIVITAMIN WITH MINERALS) TABS tablet, Take 1 tablet by mouth daily., Disp: , Rfl:  .  OVER THE COUNTER MEDICATION, Take 15 mLs by mouth daily. Balance Oil, Disp: , Rfl:  .  propranolol ER (INDERAL LA) 120 MG 24 hr capsule, Take 1 capsule (120 mg total) by mouth daily., Disp: 30 capsule, Rfl: 2 .  testosterone cypionate (DEPOTESTOSTERONE CYPIONATE) 200 MG/ML injection, Inject 100 mg into the muscle every Wednesday., Disp: , Rfl: 5 .  Ubiquinol 100 MG CAPS, Take 100 mg by mouth daily., Disp: , Rfl:    Review of Systems  Constitutional: Negative for chills and fever.  HENT: Negative for congestion and sore throat.   Eyes: Negative for photophobia.  Respiratory: Negative for cough, shortness of breath and wheezing.   Cardiovascular: Negative for chest pain, palpitations and leg swelling.  Gastrointestinal: Negative for abdominal pain, blood in stool, constipation, diarrhea, nausea and vomiting.  Genitourinary: Negative for dysuria, flank pain and hematuria.  Musculoskeletal: Negative for back pain and myalgias.  Skin: Positive for wound. Negative for rash.  Neurological: Negative for dizziness, weakness and headaches.  Hematological: Does not bruise/bleed easily.  Psychiatric/Behavioral: Negative for suicidal ideas.       Objective:   Physical Exam  Constitutional: He is oriented to person, place, and time. He appears well-developed and well-nourished. No distress.  HENT:  Head: Normocephalic and atraumatic.  Mouth/Throat: No oropharyngeal exudate.  Eyes: Conjunctivae and EOM are normal. No scleral icterus.  Neck: Normal range of motion. Neck supple.   Cardiovascular: Normal rate and regular rhythm.  Pulmonary/Chest: Effort normal. No respiratory distress. He has no wheezes.  Abdominal: He exhibits no distension.  Musculoskeletal: He exhibits no edema or tenderness.  Neurological: He is alert and oriented to person, place, and time. He exhibits normal muscle tone. Coordination normal.  Skin: Skin is warm and dry. No rash noted. He is not diaphoretic. No erythema. No pallor.  Psychiatric: He has a normal mood and affect. His behavior is normal. Judgment and thought content normal.   Shoulder incision see picture 12/25/16:      Shoulder looks better today,      Assessment & Plan:   Prosthetic shoulder infecction:   re check ESR, CRP today. They should again be normal. He can then stop his Augmentin

## 2017-07-19 LAB — C-REACTIVE PROTEIN: CRP: 1.8 mg/L (ref <8.0)

## 2017-07-19 LAB — SEDIMENTATION RATE: SED RATE: 2 mm/h (ref 0–20)

## 2017-07-30 ENCOUNTER — Telehealth: Payer: Self-pay | Admitting: *Deleted

## 2017-07-30 NOTE — Telephone Encounter (Signed)
Per message from Dr Tommy Medal in the result note called the patient to advise him he can stop his antibiotics. Had to leave a message for him to call the office as he did not answer his phone.   Tommy Medal, Lavell Islam, MD  Janyce Llanos F, CMA        Pt can stop his antibiotics

## 2017-08-15 DIAGNOSIS — K30 Functional dyspepsia: Secondary | ICD-10-CM | POA: Diagnosis not present

## 2017-08-24 DIAGNOSIS — G479 Sleep disorder, unspecified: Secondary | ICD-10-CM | POA: Diagnosis not present

## 2017-08-24 DIAGNOSIS — M2550 Pain in unspecified joint: Secondary | ICD-10-CM | POA: Diagnosis not present

## 2017-08-24 DIAGNOSIS — E291 Testicular hypofunction: Secondary | ICD-10-CM | POA: Diagnosis not present

## 2017-08-29 DIAGNOSIS — M2550 Pain in unspecified joint: Secondary | ICD-10-CM | POA: Diagnosis not present

## 2017-08-29 DIAGNOSIS — D751 Secondary polycythemia: Secondary | ICD-10-CM | POA: Diagnosis not present

## 2017-08-29 DIAGNOSIS — G479 Sleep disorder, unspecified: Secondary | ICD-10-CM | POA: Diagnosis not present

## 2017-08-29 DIAGNOSIS — E291 Testicular hypofunction: Secondary | ICD-10-CM | POA: Diagnosis not present

## 2017-09-26 DIAGNOSIS — R030 Elevated blood-pressure reading, without diagnosis of hypertension: Secondary | ICD-10-CM | POA: Diagnosis not present

## 2017-09-26 DIAGNOSIS — K219 Gastro-esophageal reflux disease without esophagitis: Secondary | ICD-10-CM | POA: Diagnosis not present

## 2017-10-11 DIAGNOSIS — R293 Abnormal posture: Secondary | ICD-10-CM | POA: Diagnosis not present

## 2017-10-11 DIAGNOSIS — M9903 Segmental and somatic dysfunction of lumbar region: Secondary | ICD-10-CM | POA: Diagnosis not present

## 2017-10-11 DIAGNOSIS — M2560 Stiffness of unspecified joint, not elsewhere classified: Secondary | ICD-10-CM | POA: Diagnosis not present

## 2017-10-11 DIAGNOSIS — M545 Low back pain: Secondary | ICD-10-CM | POA: Diagnosis not present

## 2017-10-15 DIAGNOSIS — R293 Abnormal posture: Secondary | ICD-10-CM | POA: Diagnosis not present

## 2017-10-15 DIAGNOSIS — M2560 Stiffness of unspecified joint, not elsewhere classified: Secondary | ICD-10-CM | POA: Diagnosis not present

## 2017-10-15 DIAGNOSIS — M545 Low back pain: Secondary | ICD-10-CM | POA: Diagnosis not present

## 2017-10-15 DIAGNOSIS — M9903 Segmental and somatic dysfunction of lumbar region: Secondary | ICD-10-CM | POA: Diagnosis not present

## 2017-10-22 DIAGNOSIS — M2560 Stiffness of unspecified joint, not elsewhere classified: Secondary | ICD-10-CM | POA: Diagnosis not present

## 2017-10-22 DIAGNOSIS — R293 Abnormal posture: Secondary | ICD-10-CM | POA: Diagnosis not present

## 2017-10-22 DIAGNOSIS — M9903 Segmental and somatic dysfunction of lumbar region: Secondary | ICD-10-CM | POA: Diagnosis not present

## 2017-10-22 DIAGNOSIS — M545 Low back pain: Secondary | ICD-10-CM | POA: Diagnosis not present

## 2017-10-24 DIAGNOSIS — M2560 Stiffness of unspecified joint, not elsewhere classified: Secondary | ICD-10-CM | POA: Diagnosis not present

## 2017-10-24 DIAGNOSIS — M545 Low back pain: Secondary | ICD-10-CM | POA: Diagnosis not present

## 2017-10-24 DIAGNOSIS — M9903 Segmental and somatic dysfunction of lumbar region: Secondary | ICD-10-CM | POA: Diagnosis not present

## 2017-10-24 DIAGNOSIS — R293 Abnormal posture: Secondary | ICD-10-CM | POA: Diagnosis not present

## 2017-10-26 DIAGNOSIS — M545 Low back pain: Secondary | ICD-10-CM | POA: Diagnosis not present

## 2017-10-26 DIAGNOSIS — M2560 Stiffness of unspecified joint, not elsewhere classified: Secondary | ICD-10-CM | POA: Diagnosis not present

## 2017-10-26 DIAGNOSIS — M9903 Segmental and somatic dysfunction of lumbar region: Secondary | ICD-10-CM | POA: Diagnosis not present

## 2017-10-26 DIAGNOSIS — R293 Abnormal posture: Secondary | ICD-10-CM | POA: Diagnosis not present

## 2017-10-29 DIAGNOSIS — M545 Low back pain: Secondary | ICD-10-CM | POA: Diagnosis not present

## 2017-10-29 DIAGNOSIS — R293 Abnormal posture: Secondary | ICD-10-CM | POA: Diagnosis not present

## 2017-10-29 DIAGNOSIS — M9903 Segmental and somatic dysfunction of lumbar region: Secondary | ICD-10-CM | POA: Diagnosis not present

## 2017-10-29 DIAGNOSIS — M2560 Stiffness of unspecified joint, not elsewhere classified: Secondary | ICD-10-CM | POA: Diagnosis not present

## 2017-10-31 DIAGNOSIS — M9903 Segmental and somatic dysfunction of lumbar region: Secondary | ICD-10-CM | POA: Diagnosis not present

## 2017-10-31 DIAGNOSIS — M545 Low back pain: Secondary | ICD-10-CM | POA: Diagnosis not present

## 2017-10-31 DIAGNOSIS — R293 Abnormal posture: Secondary | ICD-10-CM | POA: Diagnosis not present

## 2017-10-31 DIAGNOSIS — M2560 Stiffness of unspecified joint, not elsewhere classified: Secondary | ICD-10-CM | POA: Diagnosis not present

## 2017-11-02 DIAGNOSIS — M9903 Segmental and somatic dysfunction of lumbar region: Secondary | ICD-10-CM | POA: Diagnosis not present

## 2017-11-02 DIAGNOSIS — M2560 Stiffness of unspecified joint, not elsewhere classified: Secondary | ICD-10-CM | POA: Diagnosis not present

## 2017-11-02 DIAGNOSIS — M545 Low back pain: Secondary | ICD-10-CM | POA: Diagnosis not present

## 2017-11-02 DIAGNOSIS — R293 Abnormal posture: Secondary | ICD-10-CM | POA: Diagnosis not present

## 2017-11-05 DIAGNOSIS — R293 Abnormal posture: Secondary | ICD-10-CM | POA: Diagnosis not present

## 2017-11-05 DIAGNOSIS — M2560 Stiffness of unspecified joint, not elsewhere classified: Secondary | ICD-10-CM | POA: Diagnosis not present

## 2017-11-05 DIAGNOSIS — M545 Low back pain: Secondary | ICD-10-CM | POA: Diagnosis not present

## 2017-11-05 DIAGNOSIS — M9903 Segmental and somatic dysfunction of lumbar region: Secondary | ICD-10-CM | POA: Diagnosis not present

## 2017-11-07 DIAGNOSIS — R293 Abnormal posture: Secondary | ICD-10-CM | POA: Diagnosis not present

## 2017-11-07 DIAGNOSIS — M2560 Stiffness of unspecified joint, not elsewhere classified: Secondary | ICD-10-CM | POA: Diagnosis not present

## 2017-11-07 DIAGNOSIS — M9903 Segmental and somatic dysfunction of lumbar region: Secondary | ICD-10-CM | POA: Diagnosis not present

## 2017-11-07 DIAGNOSIS — M545 Low back pain: Secondary | ICD-10-CM | POA: Diagnosis not present

## 2017-11-09 DIAGNOSIS — M2560 Stiffness of unspecified joint, not elsewhere classified: Secondary | ICD-10-CM | POA: Diagnosis not present

## 2017-11-09 DIAGNOSIS — R293 Abnormal posture: Secondary | ICD-10-CM | POA: Diagnosis not present

## 2017-11-09 DIAGNOSIS — M9903 Segmental and somatic dysfunction of lumbar region: Secondary | ICD-10-CM | POA: Diagnosis not present

## 2017-11-09 DIAGNOSIS — M545 Low back pain: Secondary | ICD-10-CM | POA: Diagnosis not present

## 2017-11-12 DIAGNOSIS — M9903 Segmental and somatic dysfunction of lumbar region: Secondary | ICD-10-CM | POA: Diagnosis not present

## 2017-11-12 DIAGNOSIS — M2560 Stiffness of unspecified joint, not elsewhere classified: Secondary | ICD-10-CM | POA: Diagnosis not present

## 2017-11-12 DIAGNOSIS — R293 Abnormal posture: Secondary | ICD-10-CM | POA: Diagnosis not present

## 2017-11-12 DIAGNOSIS — M545 Low back pain: Secondary | ICD-10-CM | POA: Diagnosis not present

## 2017-11-14 DIAGNOSIS — M545 Low back pain: Secondary | ICD-10-CM | POA: Diagnosis not present

## 2017-11-14 DIAGNOSIS — R293 Abnormal posture: Secondary | ICD-10-CM | POA: Diagnosis not present

## 2017-11-14 DIAGNOSIS — M9903 Segmental and somatic dysfunction of lumbar region: Secondary | ICD-10-CM | POA: Diagnosis not present

## 2017-11-14 DIAGNOSIS — M2560 Stiffness of unspecified joint, not elsewhere classified: Secondary | ICD-10-CM | POA: Diagnosis not present

## 2017-11-29 DIAGNOSIS — Z96612 Presence of left artificial shoulder joint: Secondary | ICD-10-CM | POA: Diagnosis not present

## 2017-11-29 DIAGNOSIS — Z471 Aftercare following joint replacement surgery: Secondary | ICD-10-CM | POA: Diagnosis not present

## 2017-11-29 DIAGNOSIS — M19012 Primary osteoarthritis, left shoulder: Secondary | ICD-10-CM | POA: Diagnosis not present

## 2017-11-30 DIAGNOSIS — E291 Testicular hypofunction: Secondary | ICD-10-CM | POA: Diagnosis not present

## 2017-11-30 DIAGNOSIS — M2550 Pain in unspecified joint: Secondary | ICD-10-CM | POA: Diagnosis not present

## 2017-11-30 DIAGNOSIS — G479 Sleep disorder, unspecified: Secondary | ICD-10-CM | POA: Diagnosis not present

## 2017-12-05 DIAGNOSIS — D751 Secondary polycythemia: Secondary | ICD-10-CM | POA: Diagnosis not present

## 2017-12-05 DIAGNOSIS — R5383 Other fatigue: Secondary | ICD-10-CM | POA: Diagnosis not present

## 2017-12-05 DIAGNOSIS — G479 Sleep disorder, unspecified: Secondary | ICD-10-CM | POA: Diagnosis not present

## 2017-12-05 DIAGNOSIS — E291 Testicular hypofunction: Secondary | ICD-10-CM | POA: Diagnosis not present

## 2017-12-12 DIAGNOSIS — E291 Testicular hypofunction: Secondary | ICD-10-CM | POA: Diagnosis not present

## 2017-12-13 ENCOUNTER — Other Ambulatory Visit: Payer: Self-pay | Admitting: Orthopedic Surgery

## 2017-12-13 DIAGNOSIS — Z96612 Presence of left artificial shoulder joint: Secondary | ICD-10-CM

## 2017-12-17 ENCOUNTER — Ambulatory Visit
Admission: RE | Admit: 2017-12-17 | Discharge: 2017-12-17 | Disposition: A | Discharge status: Home / Self Care | Payer: Self-pay | Source: Ambulatory Visit | Attending: Orthopedic Surgery | Admitting: Orthopedic Surgery

## 2017-12-17 DIAGNOSIS — S4992XA Unspecified injury of left shoulder and upper arm, initial encounter: Secondary | ICD-10-CM | POA: Diagnosis not present

## 2017-12-17 DIAGNOSIS — M25512 Pain in left shoulder: Secondary | ICD-10-CM | POA: Diagnosis not present

## 2017-12-17 DIAGNOSIS — Z96612 Presence of left artificial shoulder joint: Secondary | ICD-10-CM

## 2017-12-18 ENCOUNTER — Other Ambulatory Visit: Payer: Medicare Other

## 2017-12-19 DIAGNOSIS — E291 Testicular hypofunction: Secondary | ICD-10-CM | POA: Diagnosis not present

## 2017-12-20 DIAGNOSIS — Z96612 Presence of left artificial shoulder joint: Secondary | ICD-10-CM | POA: Diagnosis not present

## 2017-12-26 DIAGNOSIS — E291 Testicular hypofunction: Secondary | ICD-10-CM | POA: Diagnosis not present

## 2017-12-28 DIAGNOSIS — Z4789 Encounter for other orthopedic aftercare: Secondary | ICD-10-CM | POA: Insufficient documentation

## 2017-12-28 NOTE — Pre-Procedure Instructions (Signed)
Karson Reede [facility]  12/28/2017      [id number] - [place], [place] - [address] [place] [address] Phone: [phone] Fax: [phone]  [id number] - [place], [place] - [address] [place] [address] Phone: [phone] Fax: [phone]    Your procedure is scheduled on  Tuesday 01/01/18  Report to [facility] Admitting at 1030 A.M.  Call this number if you have problems the morning of surgery:  [phone]   Remember:  Do not eat or drink after midnight.    Take these medicines the morning of surgery with A SIP OF WATER - ALLOPURINOL, PROPRANOLOL (INDERAL), NASAL SPRAY  7 days prior to surgery STOP taking any Aspirin(unless otherwise instructed by your surgeon), Aleve, Naproxen, CELEBREX,  Ibuprofen, Motrin, Advil, Goody's, BC's, all herbal medications, fish oil, and all vitamins    Do not wear jewelry, make-up or nail polish.  Do not wear lotions, powders, or perfumes, or deodorant.  Do not shave 48 hours prior to surgery.  Men may shave face and neck.  Do not bring valuables to the hospital.  [facility] is not responsible for any belongings or valuables.  Contacts, dentures or bridgework may not be worn into surgery.  Leave your suitcase in the car.  After surgery it may be brought to your room.  For patients admitted to the hospital, discharge time will be determined by your treatment team.  Patients discharged the day of surgery will not be allowed to drive home.   Name and phone number of your driver:    Special instructions:  [facility] - Preparing for Surgery  Before surgery, you can play an important role.  Because skin is not sterile, your skin needs to be as free of germs as possible.  You can reduce the number of germs on you skin by washing with CHG (chlorahexidine gluconate) soap before surgery.  CHG is an antiseptic cleaner which kills germs and  bonds with the skin to continue killing germs even after washing.  Oral Hygiene is also important in reducing the risk of infection.  Remember to brush your teeth with your regular toothpaste the morning of surgery.  Please DO NOT use if you have an allergy to CHG or antibacterial soaps.  If your skin becomes reddened/irritated stop using the CHG and inform your nurse when you arrive at Short Stay.  Do not shave (including legs and underarms) for at least 48 hours prior to the first CHG shower.  You may shave your face.  Please follow these instructions carefully:   1.  Shower with CHG Soap the night before surgery and the morning of Surgery.  2.  If you choose to wash your hair, wash your hair first as usual with your normal shampoo.  3.  After you shampoo, rinse your hair and body thoroughly to remove the shampoo. 4.  Use CHG as you would any other liquid soap.  You can apply chg directly to the skin and wash gently with a      scrungie or washcloth.           5.  Apply the CHG Soap to your body ONLY FROM THE NECK DOWN.   Do not use on open wounds or open sores. Avoid contact with your eyes, ears, mouth and genitals (private parts).  Wash genitals (private parts) with your normal soap.  6.  Wash thoroughly, paying special attention to the area where your surgery will be performed.  7.  Thoroughly rinse your body with warm water from the neck down.  8.  DO NOT shower/wash with your normal soap after using and rinsing off the CHG Soap.  9.  Pat yourself dry with a clean towel.            10.  Wear clean pajamas.            11.  Place clean sheets on your bed the night of your first shower and do not sleep with pets.  Day of Surgery  Do not apply any lotions/deoderants the morning of surgery.   Please wear clean clothes to the hospital/surgery center. Remember to brush your teeth with toothpaste.     Please read over the following fact sheets that you were given. MRSA Information and  Surgical Site Infection Prevention

## 2017-12-31 ENCOUNTER — Encounter (HOSPITAL_COMMUNITY)
Admission: RE | Admit: 2017-12-31 | Discharge: 2017-12-31 | Disposition: A | Discharge status: Home / Self Care | Payer: PPO | Source: Ambulatory Visit | Attending: Orthopedic Surgery | Admitting: Orthopedic Surgery

## 2017-12-31 ENCOUNTER — Encounter (HOSPITAL_COMMUNITY): Payer: Self-pay

## 2017-12-31 ENCOUNTER — Other Ambulatory Visit: Payer: Self-pay

## 2017-12-31 DIAGNOSIS — Z8619 Personal history of other infectious and parasitic diseases: Secondary | ICD-10-CM | POA: Diagnosis not present

## 2017-12-31 DIAGNOSIS — Z885 Allergy status to narcotic agent status: Secondary | ICD-10-CM | POA: Diagnosis not present

## 2017-12-31 DIAGNOSIS — Z471 Aftercare following joint replacement surgery: Secondary | ICD-10-CM | POA: Diagnosis not present

## 2017-12-31 DIAGNOSIS — T84098A Other mechanical complication of other internal joint prosthesis, initial encounter: Secondary | ICD-10-CM | POA: Diagnosis not present

## 2017-12-31 DIAGNOSIS — S42295A Other nondisplaced fracture of upper end of left humerus, initial encounter for closed fracture: Secondary | ICD-10-CM | POA: Diagnosis not present

## 2017-12-31 DIAGNOSIS — T84038A Mechanical loosening of other internal prosthetic joint, initial encounter: Secondary | ICD-10-CM | POA: Diagnosis present

## 2017-12-31 DIAGNOSIS — Z96611 Presence of right artificial shoulder joint: Secondary | ICD-10-CM | POA: Diagnosis not present

## 2017-12-31 DIAGNOSIS — I10 Essential (primary) hypertension: Secondary | ICD-10-CM

## 2017-12-31 DIAGNOSIS — Z01810 Encounter for preprocedural cardiovascular examination: Secondary | ICD-10-CM | POA: Insufficient documentation

## 2017-12-31 DIAGNOSIS — Z96612 Presence of left artificial shoulder joint: Secondary | ICD-10-CM | POA: Diagnosis not present

## 2017-12-31 DIAGNOSIS — E291 Testicular hypofunction: Secondary | ICD-10-CM | POA: Diagnosis present

## 2017-12-31 DIAGNOSIS — G8918 Other acute postprocedural pain: Secondary | ICD-10-CM | POA: Diagnosis not present

## 2017-12-31 DIAGNOSIS — G894 Chronic pain syndrome: Secondary | ICD-10-CM | POA: Diagnosis not present

## 2017-12-31 DIAGNOSIS — Z79899 Other long term (current) drug therapy: Secondary | ICD-10-CM | POA: Diagnosis not present

## 2017-12-31 DIAGNOSIS — Z96653 Presence of artificial knee joint, bilateral: Secondary | ICD-10-CM | POA: Diagnosis present

## 2017-12-31 DIAGNOSIS — Z01812 Encounter for preprocedural laboratory examination: Secondary | ICD-10-CM | POA: Insufficient documentation

## 2017-12-31 DIAGNOSIS — M00112 Pneumococcal arthritis, left shoulder: Secondary | ICD-10-CM | POA: Diagnosis present

## 2017-12-31 DIAGNOSIS — Z79890 Hormone replacement therapy (postmenopausal): Secondary | ICD-10-CM | POA: Diagnosis not present

## 2017-12-31 DIAGNOSIS — Z7951 Long term (current) use of inhaled steroids: Secondary | ICD-10-CM | POA: Diagnosis not present

## 2017-12-31 DIAGNOSIS — T8459XD Infection and inflammatory reaction due to other internal joint prosthesis, subsequent encounter: Secondary | ICD-10-CM | POA: Diagnosis not present

## 2017-12-31 DIAGNOSIS — T8459XA Infection and inflammatory reaction due to other internal joint prosthesis, initial encounter: Secondary | ICD-10-CM | POA: Diagnosis present

## 2017-12-31 DIAGNOSIS — Z8261 Family history of arthritis: Secondary | ICD-10-CM | POA: Diagnosis not present

## 2017-12-31 DIAGNOSIS — M9732XA Periprosthetic fracture around internal prosthetic left shoulder joint, initial encounter: Secondary | ICD-10-CM | POA: Diagnosis not present

## 2017-12-31 HISTORY — DX: Anxiety disorder, unspecified: F41.9

## 2017-12-31 LAB — CBC
HCT: 55.1 % — ABNORMAL HIGH (ref 39.0–52.0)
HEMOGLOBIN: 18.7 g/dL — AB (ref 13.0–17.0)
MCH: 32.1 pg (ref 26.0–34.0)
MCHC: 33.9 g/dL (ref 30.0–36.0)
MCV: 94.7 fL (ref 78.0–100.0)
Platelets: 165 10*3/uL (ref 150–400)
RBC: 5.82 MIL/uL — AB (ref 4.22–5.81)
RDW: 13 % (ref 11.5–15.5)
WBC: 7.1 10*3/uL (ref 4.0–10.5)

## 2017-12-31 LAB — BASIC METABOLIC PANEL
Anion gap: 9 (ref 5–15)
BUN: 18 mg/dL (ref 8–23)
CHLORIDE: 105 mmol/L (ref 98–111)
CO2: 27 mmol/L (ref 22–32)
Calcium: 9.9 mg/dL (ref 8.9–10.3)
Creatinine, Ser: 1.36 mg/dL — ABNORMAL HIGH (ref 0.61–1.24)
GFR calc Af Amer: >60 mL/min (ref >60)
GFR calc non Af Amer: 55 mL/min — ABNORMAL LOW (ref >60)
Glucose, Bld: 114 mg/dL — ABNORMAL HIGH (ref 70–99)
POTASSIUM: 3.6 mmol/L (ref 3.5–5.1)
SODIUM: 141 mmol/L (ref 135–145)

## 2017-12-31 LAB — SURGICAL PCR SCREEN
MRSA, PCR: NEGATIVE
STAPHYLOCOCCUS AUREUS: NEGATIVE

## 2017-12-31 MED ORDER — TRANEXAMIC ACID 1000 MG/10ML IV SOLN
1000.0000 mg | INTRAVENOUS | Status: AC
  Administered 2018-01-01: 1000 mg via INTRAVENOUS
  Filled 2017-12-31: qty 10
  Filled 2017-12-31: qty 1100

## 2017-12-31 NOTE — Progress Notes (Signed)
Anesthesia Chart Review:   Case:  [id number] Date/Time:  01/01/18 1215   Procedure:  Left reverse shouler explant with antibiotic spacer placement (Left ) - 2.5 hrs   Anesthesia type:  Choice   Pre-op diagnosis:  Left shoulder replacement loosening   Location:  [facility] OR ROOM 09 / [facility] OR   Surgeon:  [name], MD      DISCUSSION: - Pt is a 62 year old male with hx HTN.   VS: BP (!) 144/91 (BP Location: Left Arm)   Pulse 72   Temp 36.7 C (Oral)   Resp 18   Ht 5\' 11"  (1.803 m)   Wt 214 lb 12.8 oz (97.4 kg)   SpO2 98%   BMI 29.96 kg/m   PROVIDERS: PCP is Orpah Melter, MD   LABS: Labs reviewed: Acceptable for surgery. (all labs ordered are listed, but only abnormal results are displayed)  Labs Reviewed  CBC - Abnormal; Notable for the following components:      Result Value   RBC 5.82 (*)    Hemoglobin 18.7 (*)    HCT 55.1 (*)    All other components within normal limits  BASIC METABOLIC PANEL - Abnormal; Notable for the following components:   Glucose, Bld 114 (*)    Creatinine, Ser 1.36 (*)    GFR calc non Af Amer 55 (*)    All other components within normal limits  SURGICAL PCR SCREEN    EKG 12/31/17: NSR. Inferior infarct, age undetermined. Q wave in III intermittently present on EKG dating back to 07/14/99.    CV:  Stress echo 12/10/09:  - Technically difficult study; stress echocardiogram with no chest pain, no ST changes, and no stress-induced wall motion abnormalities. Note patient did complain of dyspnea and oxygen saturations dropped to 83-84% with exercise.    Past Medical History:  Diagnosis Date  . Anxiety   . Arthritis   . GERD (gastroesophageal reflux disease)   . Gout 12/25/2016  . Hypertension   . Hypertension 12/25/2016  . Low testosterone 12/25/2016  . Prosthetic shoulder infection ([place]) 12/25/2016    Past Surgical History:  Procedure Laterality Date  . BACK SURGERY    . COLONOSCOPY    . IR FLUORO GUIDE CV LINE RIGHT  12/26/2016  .  LASIK    . REPLACEMENT TOTAL KNEE     both knees replacments                 (MULTIPLE SURGERIES BILATERAL KNEES PRIOR TO REPLACEMET)  . REVERSE SHOULDER ARTHROPLASTY Left 12/08/2016   Procedure: Revision left total shoulder to reverse total shoulder arthroplasty;  Surgeon: [name], MD;  Location: [facility];  Service: Orthopedics;  Laterality: Left;  . SHOULDER SURGERY    . SHOULDER SURGERY     RIGHT ARTHROPLASTY   (MULTIPLE SURGERIES)    MEDICATIONS: . allopurinol (ZYLOPRIM) 300 MG tablet  . amLODipine (NORVASC) 10 MG tablet  . amoxicillin-clavulanate (AUGMENTIN) 875-125 MG tablet  . celecoxib (CELEBREX) 200 MG capsule  . fluticasone (FLONASE) 50 MCG/ACT nasal spray  . Multiple Vitamin (MULTIVITAMIN WITH MINERALS) TABS tablet  . propranolol ER (INDERAL LA) 120 MG 24 hr capsule  . testosterone cypionate (DEPOTESTOSTERONE CYPIONATE) 200 MG/ML injection  . Ubiquinol 100 MG CAPS   No current facility-administered medications for this encounter.    Derrill Memo ON 01/01/2018] tranexamic acid (CYKLOKAPRON) 1,000 mg in sodium chloride 0.9 % 100 mL IVPB    If no changes, I anticipate pt can  proceed with surgery as scheduled.   Willeen Cass, FNP-BC [facility] Short Stay Surgical Center/Anesthesiology Phone: [phone] 12/31/2017 1:16 PM

## 2018-01-01 ENCOUNTER — Other Ambulatory Visit: Payer: Self-pay

## 2018-01-01 ENCOUNTER — Inpatient Hospital Stay (HOSPITAL_COMMUNITY): Payer: PPO | Admitting: Emergency Medicine

## 2018-01-01 ENCOUNTER — Encounter (HOSPITAL_COMMUNITY)
Admission: RE | Disposition: A | Discharge status: Home / Self Care | Payer: Self-pay | Source: Home / Self Care | Attending: Orthopedic Surgery

## 2018-01-01 ENCOUNTER — Inpatient Hospital Stay (HOSPITAL_COMMUNITY): Payer: PPO

## 2018-01-01 ENCOUNTER — Inpatient Hospital Stay (HOSPITAL_COMMUNITY): Payer: PPO | Admitting: Anesthesiology

## 2018-01-01 ENCOUNTER — Encounter (HOSPITAL_COMMUNITY): Payer: Self-pay | Admitting: Surgery

## 2018-01-01 ENCOUNTER — Inpatient Hospital Stay (HOSPITAL_COMMUNITY)
Admission: RE | Admit: 2018-01-01 | Discharge: 2018-01-03 | DRG: 496 | Disposition: A | Discharge status: Home / Self Care | Payer: PPO | Attending: Orthopedic Surgery | Admitting: Orthopedic Surgery

## 2018-01-01 DIAGNOSIS — I10 Essential (primary) hypertension: Secondary | ICD-10-CM | POA: Diagnosis present

## 2018-01-01 DIAGNOSIS — Z9889 Other specified postprocedural states: Secondary | ICD-10-CM | POA: Diagnosis present

## 2018-01-01 DIAGNOSIS — Z7951 Long term (current) use of inhaled steroids: Secondary | ICD-10-CM | POA: Diagnosis not present

## 2018-01-01 DIAGNOSIS — Z79890 Hormone replacement therapy (postmenopausal): Secondary | ICD-10-CM

## 2018-01-01 DIAGNOSIS — M00112 Pneumococcal arthritis, left shoulder: Secondary | ICD-10-CM | POA: Diagnosis present

## 2018-01-01 DIAGNOSIS — T8459XA Infection and inflammatory reaction due to other internal joint prosthesis, initial encounter: Secondary | ICD-10-CM | POA: Diagnosis present

## 2018-01-01 DIAGNOSIS — M9732XA Periprosthetic fracture around internal prosthetic left shoulder joint, initial encounter: Secondary | ICD-10-CM | POA: Diagnosis not present

## 2018-01-01 DIAGNOSIS — Z885 Allergy status to narcotic agent status: Secondary | ICD-10-CM

## 2018-01-01 DIAGNOSIS — Z79899 Other long term (current) drug therapy: Secondary | ICD-10-CM

## 2018-01-01 DIAGNOSIS — Z96653 Presence of artificial knee joint, bilateral: Secondary | ICD-10-CM | POA: Diagnosis present

## 2018-01-01 DIAGNOSIS — Z8261 Family history of arthritis: Secondary | ICD-10-CM | POA: Diagnosis not present

## 2018-01-01 DIAGNOSIS — T8459XD Infection and inflammatory reaction due to other internal joint prosthesis, subsequent encounter: Secondary | ICD-10-CM | POA: Diagnosis not present

## 2018-01-01 DIAGNOSIS — Z96612 Presence of left artificial shoulder joint: Secondary | ICD-10-CM | POA: Diagnosis present

## 2018-01-01 DIAGNOSIS — E291 Testicular hypofunction: Secondary | ICD-10-CM | POA: Diagnosis present

## 2018-01-01 DIAGNOSIS — Z8619 Personal history of other infectious and parasitic diseases: Secondary | ICD-10-CM

## 2018-01-01 DIAGNOSIS — T84038A Mechanical loosening of other internal prosthetic joint, initial encounter: Secondary | ICD-10-CM | POA: Diagnosis present

## 2018-01-01 HISTORY — PX: REVERSE SHOULDER ARTHROPLASTY: SHX5054

## 2018-01-01 SURGERY — ARTHROPLASTY, SHOULDER, TOTAL, REVERSE
Anesthesia: Regional | Site: Shoulder | Laterality: Left

## 2018-01-01 MED ORDER — UBIQUINOL 100 MG PO CAPS: 100.0000 mg | ORAL_CAPSULE | Freq: Every day | ORAL | Status: DC

## 2018-01-01 MED ORDER — GABAPENTIN 300 MG PO CAPS
300.0000 mg | ORAL_CAPSULE | Freq: Three times a day (TID) | ORAL | Status: DC
  Administered 2018-01-01 – 2018-01-03 (×5): 300 mg via ORAL
  Filled 2018-01-01 (×5): qty 1

## 2018-01-01 MED ORDER — METHOCARBAMOL 500 MG PO TABS
500.0000 mg | ORAL_TABLET | Freq: Four times a day (QID) | ORAL | Status: DC | PRN
  Administered 2018-01-03: 500 mg via ORAL
  Filled 2018-01-01: qty 1

## 2018-01-01 MED ORDER — ASPIRIN EC 325 MG PO TBEC
325.0000 mg | DELAYED_RELEASE_TABLET | Freq: Every day | ORAL | Status: DC
  Administered 2018-01-02 – 2018-01-03 (×2): 325 mg via ORAL
  Filled 2018-01-01 (×2): qty 1

## 2018-01-01 MED ORDER — HYDROMORPHONE HCL 1 MG/ML IJ SOLN: 0.2500 mg | INTRAMUSCULAR | Status: DC | PRN

## 2018-01-01 MED ORDER — CELECOXIB 200 MG PO CAPS
200.0000 mg | ORAL_CAPSULE | Freq: Two times a day (BID) | ORAL | Status: DC
  Administered 2018-01-01 – 2018-01-03 (×4): 200 mg via ORAL
  Filled 2018-01-01 (×4): qty 1

## 2018-01-01 MED ORDER — HYDROCODONE-ACETAMINOPHEN 7.5-325 MG PO TABS: 1.0000 | ORAL_TABLET | ORAL | Status: DC | PRN

## 2018-01-01 MED ORDER — HYDROGEN PEROXIDE 3 % EX SOLN
CUTANEOUS | Status: AC
  Filled 2018-01-01: qty 473

## 2018-01-01 MED ORDER — VANCOMYCIN HCL IN DEXTROSE 1-5 GM/200ML-% IV SOLN
1000.0000 mg | Freq: Two times a day (BID) | INTRAVENOUS | Status: AC
  Administered 2018-01-02: 1000 mg via INTRAVENOUS
  Filled 2018-01-01: qty 200

## 2018-01-01 MED ORDER — DOCUSATE SODIUM 100 MG PO CAPS
100.0000 mg | ORAL_CAPSULE | Freq: Two times a day (BID) | ORAL | Status: DC
  Administered 2018-01-01 – 2018-01-03 (×4): 100 mg via ORAL
  Filled 2018-01-01 (×4): qty 1

## 2018-01-01 MED ORDER — PROPOFOL 10 MG/ML IV BOLUS
INTRAVENOUS | Status: AC
  Filled 2018-01-01: qty 20

## 2018-01-01 MED ORDER — PHENOL 1.4 % MT LIQD
1.0000 | OROMUCOSAL | Status: DC | PRN
  Filled 2018-01-01: qty 177

## 2018-01-01 MED ORDER — PROPRANOLOL HCL ER 120 MG PO CP24
120.0000 mg | ORAL_CAPSULE | Freq: Every day | ORAL | Status: DC
  Administered 2018-01-02: 120 mg via ORAL
  Filled 2018-01-01: qty 1

## 2018-01-01 MED ORDER — ONDANSETRON HCL 4 MG/2ML IJ SOLN: 4.0000 mg | Freq: Four times a day (QID) | INTRAMUSCULAR | Status: DC | PRN

## 2018-01-01 MED ORDER — PROPRANOLOL HCL ER 120 MG PO CP24
120.0000 mg | ORAL_CAPSULE | Freq: Every day | ORAL | Status: DC
  Administered 2018-01-01: 120 mg via ORAL
  Filled 2018-01-01: qty 1

## 2018-01-01 MED ORDER — METOCLOPRAMIDE HCL 5 MG/ML IJ SOLN: 5.0000 mg | Freq: Three times a day (TID) | INTRAMUSCULAR | Status: DC | PRN

## 2018-01-01 MED ORDER — ALLOPURINOL 300 MG PO TABS
300.0000 mg | ORAL_TABLET | Freq: Every day | ORAL | Status: DC
  Administered 2018-01-02 – 2018-01-03 (×2): 300 mg via ORAL
  Filled 2018-01-01 (×2): qty 1

## 2018-01-01 MED ORDER — MIDAZOLAM HCL 2 MG/2ML IJ SOLN
INTRAMUSCULAR | Status: AC
  Administered 2018-01-01: 2 mg via INTRAVENOUS
  Filled 2018-01-01: qty 2

## 2018-01-01 MED ORDER — VANCOMYCIN HCL 1000 MG IV SOLR
INTRAVENOUS | Status: AC
  Filled 2018-01-01: qty 2000

## 2018-01-01 MED ORDER — FENTANYL CITRATE (PF) 100 MCG/2ML IJ SOLN
INTRAMUSCULAR | Status: AC
  Filled 2018-01-01: qty 2

## 2018-01-01 MED ORDER — MORPHINE SULFATE (PF) 2 MG/ML IV SOLN: 0.5000 mg | INTRAVENOUS | Status: DC | PRN

## 2018-01-01 MED ORDER — DEXAMETHASONE SODIUM PHOSPHATE 10 MG/ML IJ SOLN
INTRAMUSCULAR | Status: DC | PRN
  Administered 2018-01-01: 10 mg via INTRAVENOUS

## 2018-01-01 MED ORDER — VANCOMYCIN HCL IN DEXTROSE 1-5 GM/200ML-% IV SOLN
1000.0000 mg | INTRAVENOUS | Status: AC
  Administered 2018-01-01: 1000 mg via INTRAVENOUS
  Filled 2018-01-01 (×2): qty 200

## 2018-01-01 MED ORDER — CLONAZEPAM 0.5 MG PO TABS
0.5000 mg | ORAL_TABLET | Freq: Every day | ORAL | Status: DC
  Filled 2018-01-01: qty 1

## 2018-01-01 MED ORDER — FENTANYL CITRATE (PF) 100 MCG/2ML IJ SOLN
100.0000 ug | Freq: Once | INTRAMUSCULAR | Status: AC
  Administered 2018-01-01: 100 ug via INTRAVENOUS

## 2018-01-01 MED ORDER — CHLORHEXIDINE GLUCONATE 4 % EX LIQD: 60.0000 mL | Freq: Once | CUTANEOUS | Status: DC

## 2018-01-01 MED ORDER — TRAMADOL HCL 50 MG PO TABS
50.0000 mg | ORAL_TABLET | Freq: Four times a day (QID) | ORAL | Status: DC
  Administered 2018-01-01 – 2018-01-03 (×7): 50 mg via ORAL
  Filled 2018-01-01 (×7): qty 1

## 2018-01-01 MED ORDER — METOCLOPRAMIDE HCL 5 MG PO TABS: 5.0000 mg | ORAL_TABLET | Freq: Three times a day (TID) | ORAL | Status: DC | PRN

## 2018-01-01 MED ORDER — MENTHOL 3 MG MT LOZG: 1.0000 | LOZENGE | OROMUCOSAL | Status: DC | PRN

## 2018-01-01 MED ORDER — SODIUM CHLORIDE 0.9 % IV SOLN
INTRAVENOUS | Status: DC
  Administered 2018-01-01: 20:00:00 via INTRAVENOUS

## 2018-01-01 MED ORDER — AMLODIPINE BESYLATE 10 MG PO TABS
10.0000 mg | ORAL_TABLET | Freq: Every day | ORAL | Status: DC
  Administered 2018-01-02: 10 mg via ORAL
  Filled 2018-01-01: qty 1

## 2018-01-01 MED ORDER — ACETAMINOPHEN 500 MG PO TABS
500.0000 mg | ORAL_TABLET | Freq: Four times a day (QID) | ORAL | Status: AC
  Administered 2018-01-01 – 2018-01-02 (×4): 500 mg via ORAL
  Filled 2018-01-01 (×4): qty 1

## 2018-01-01 MED ORDER — PHENYLEPHRINE HCL 10 MG/ML IJ SOLN
INTRAMUSCULAR | Status: DC | PRN
  Administered 2018-01-01: 15 ug/min via INTRAVENOUS

## 2018-01-01 MED ORDER — ALUM & MAG HYDROXIDE-SIMETH 200-200-20 MG/5ML PO SUSP
30.0000 mL | ORAL | Status: DC | PRN
  Administered 2018-01-02: 30 mL via ORAL
  Filled 2018-01-01: qty 30

## 2018-01-01 MED ORDER — 0.9 % SODIUM CHLORIDE (POUR BTL) OPTIME
TOPICAL | Status: DC | PRN
  Administered 2018-01-01: 1000 mL

## 2018-01-01 MED ORDER — LACTATED RINGERS IV SOLN
INTRAVENOUS | Status: DC
  Administered 2018-01-01 (×2): via INTRAVENOUS

## 2018-01-01 MED ORDER — METHOCARBAMOL 1000 MG/10ML IJ SOLN
500.0000 mg | Freq: Four times a day (QID) | INTRAVENOUS | Status: DC | PRN
  Filled 2018-01-01: qty 5

## 2018-01-01 MED ORDER — PROPOFOL 10 MG/ML IV BOLUS
INTRAVENOUS | Status: DC | PRN
  Administered 2018-01-01: 200 mg via INTRAVENOUS

## 2018-01-01 MED ORDER — BUPIVACAINE HCL (PF) 0.5 % IJ SOLN
INTRAMUSCULAR | Status: DC | PRN
  Administered 2018-01-01: 10 mL

## 2018-01-01 MED ORDER — ONDANSETRON HCL 4 MG PO TABS: 4.0000 mg | ORAL_TABLET | Freq: Four times a day (QID) | ORAL | Status: DC | PRN

## 2018-01-01 MED ORDER — VANCOMYCIN HCL 1000 MG IV SOLR
INTRAVENOUS | Status: DC | PRN
  Administered 2018-01-01: 2000 mg

## 2018-01-01 MED ORDER — LIDOCAINE 2% (20 MG/ML) 5 ML SYRINGE
INTRAMUSCULAR | Status: DC | PRN
  Administered 2018-01-01: 20 mg via INTRAVENOUS
  Administered 2018-01-01: 80 mg via INTRAVENOUS

## 2018-01-01 MED ORDER — FENTANYL CITRATE (PF) 100 MCG/2ML IJ SOLN
INTRAMUSCULAR | Status: AC
  Administered 2018-01-01: 100 ug via INTRAVENOUS
  Filled 2018-01-01: qty 2

## 2018-01-01 MED ORDER — FENTANYL CITRATE (PF) 100 MCG/2ML IJ SOLN
INTRAMUSCULAR | Status: DC | PRN
  Administered 2018-01-01: 25 ug via INTRAVENOUS
  Administered 2018-01-01: 50 ug via INTRAVENOUS
  Administered 2018-01-01: 25 ug via INTRAVENOUS

## 2018-01-01 MED ORDER — PROMETHAZINE HCL 25 MG/ML IJ SOLN: 6.2500 mg | INTRAMUSCULAR | Status: DC | PRN

## 2018-01-01 MED ORDER — PHENYLEPHRINE 40 MCG/ML (10ML) SYRINGE FOR IV PUSH (FOR BLOOD PRESSURE SUPPORT)
PREFILLED_SYRINGE | INTRAVENOUS | Status: DC | PRN
  Administered 2018-01-01: 80 ug via INTRAVENOUS

## 2018-01-01 MED ORDER — MIDAZOLAM HCL 2 MG/2ML IJ SOLN
2.0000 mg | Freq: Once | INTRAMUSCULAR | Status: AC
  Administered 2018-01-01: 2 mg via INTRAVENOUS

## 2018-01-01 MED ORDER — BUPIVACAINE LIPOSOME 1.3 % IJ SUSP
INTRAMUSCULAR | Status: DC | PRN
  Administered 2018-01-01: 10 mL via PERINEURAL

## 2018-01-01 SURGICAL SUPPLY — 35 items
BLADE 15 SAFETY STRL DISP (BLADE) ×2 IMPLANT
BOWL SMART MIX CTS (DISPOSABLE) ×2 IMPLANT
CABLE CERLAGE W/CRIMP 1.8 (Cable) ×2 IMPLANT
CEMENT BONE R 1X40 (Cement) ×2 IMPLANT
CEMENT BONE REFOBACIN R1X40 US (Cement) ×4 IMPLANT
COVER SURGICAL LIGHT HANDLE (MISCELLANEOUS) ×2 IMPLANT
DRAPE U-SHAPE 47X51 STRL (DRAPES) ×4 IMPLANT
DRSG AQUACEL AG ADV 3.5X10 (GAUZE/BANDAGES/DRESSINGS) ×2 IMPLANT
DURAPREP 26ML APPLICATOR (WOUND CARE) ×2 IMPLANT
ELECT BLADE TIP CTD 4 INCH (ELECTRODE) ×2 IMPLANT
ELECT REM PT RETURN 15FT ADLT (MISCELLANEOUS) ×2 IMPLANT
GLOVE BIO SURGEON STRL SZ7 (GLOVE) ×2 IMPLANT
GLOVE BIOGEL PI IND STRL 7.5 (GLOVE) ×2 IMPLANT
GLOVE BIOGEL PI IND STRL 8.5 (GLOVE) ×2 IMPLANT
GLOVE BIOGEL PI INDICATOR 7.5 (GLOVE) ×2
GLOVE BIOGEL PI INDICATOR 8.5 (GLOVE) ×2
GLOVE ECLIPSE 8.0 STRL XLNG CF (GLOVE) ×4 IMPLANT
GLOVE ORTHO TXT STRL SZ7.5 (GLOVE) ×4 IMPLANT
GOWN STRL REUS W/TWL 2XL LVL3 (GOWN DISPOSABLE) ×8 IMPLANT
GOWN STRL REUS W/TWL LRG LVL3 (GOWN DISPOSABLE) ×6 IMPLANT
HANDPIECE INTERPULSE COAX TIP (DISPOSABLE) ×1
MANIFOLD NEPTUNE II (INSTRUMENTS) ×2 IMPLANT
PACK TOTAL JOINT (CUSTOM PROCEDURE TRAY) ×2 IMPLANT
SET HNDPC FAN SPRY TIP SCT (DISPOSABLE) ×1 IMPLANT
SHOULDER CEMENT SPACER MOLD ×2 IMPLANT
SLING ARM IMMOBILIZER LRG (SOFTGOODS) ×2 IMPLANT
SUCTION FRAZIER HANDLE 12FR (TUBING) ×1
SUCTION TUBE FRAZIER 12FR DISP (TUBING) ×1 IMPLANT
SUT ETHILON 3 0 PS 1 (SUTURE) ×2 IMPLANT
SUT MON AB 2-0 CT1 36 (SUTURE) ×4 IMPLANT
SUT PDS AB 1 CT1 27 (SUTURE) ×2 IMPLANT
SYRINGE 3CC LL L/F (MISCELLANEOUS) ×2 IMPLANT
TOWEL OR 17X26 10 PK STRL BLUE (TOWEL DISPOSABLE) ×2 IMPLANT
WATER STERILE IRR 1000ML POUR (IV SOLUTION) ×2 IMPLANT
YANKAUER SUCT BULB TIP 10FT TU (MISCELLANEOUS) ×2 IMPLANT

## 2018-01-01 NOTE — Consult Note (Signed)
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 Await his repeat Cx from 7-9 2. Start ceftriaxone 3. PIC line 4. Explained to pt that he will likely need prolonged therapy again 5. Check ESR and CRP  Thank you so much for this interesting consult,  Active Problems:   * No active hospital problems. *   . chlorhexidine  60 mL Topical Once    HPI: Geoffrey West is a 62 y.o. male with hx of L shoulder arthroplasty which then developed loosening and dislocation. He underwent revision 12-08-16. He had Cx done at that time which grew Coag Neg Staph and P acnes.  He was initially treated with Ceftriaxone/rifapin, then changed to zyvox/rifampin, then lastly to augmentin. He took this for 6 months and was seen in ID f/u 06-2017. His ESr and CRP were normal at that time.   By June of this year he had pain inhis shoulder for several weeks after a swimming injury. He underwent CT scan on 12-17-17:  1. Prior reverse total shoulder arthroplasty with fractures of two of the glenoid component screws and lucency between the inferior portion of the glenoid component and the adjacent glenoid bone, consistent with loosening. 2. Large amount of fluid within the subacromial/subdeltoid bursa, likely reflecting bursitis. He comes to hospital 7-9 for removal of hardware and antibiotic spacer placement.  He denies f/c. States he never had shoulder pain.  Review of Systems: Review of Systems  Constitutional: Negative for chills and fever.  Respiratory: Negative for cough and shortness of breath.   Gastrointestinal: Negative for constipation and diarrhea.  Genitourinary: Negative for dysuria.  Musculoskeletal: Negative for joint pain.    Past Medical  History:  Diagnosis Date  . Anxiety   . Arthritis   . GERD (gastroesophageal reflux disease)   . Gout 12/25/2016  . Hypertension   . Hypertension 12/25/2016  . Low testosterone 12/25/2016  . 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 Smoking status: Never Smoker  . Smokeless tobacco: Never Used  Substance Use Topics  . Alcohol use: No  . Drug use: No    Family History  Problem Relation Age of Onset  . Arthritis Mother   . Arthritis Sister   . Alzheimer's disease Brother   . Arthritis Brother      Medications:  Scheduled: . allopurinol  300 mg Oral Daily  . amLODipine  10 mg Oral QHS  . aspirin EC  325 mg Oral Daily  . celecoxib  200 mg Oral BID  . clonazePAM  0.5 mg Oral QHS  . docusate sodium  100 mg Oral BID  . gabapentin  300 mg Oral TID  . propranolol ER  120 mg Oral QHS  . traMADol  50 mg Oral Q6H    Abtx:  Anti-infectives (From admission, onward)   Start     Dose/Rate Route Frequency Ordered Stop   01/01/18 1130  vancomycin (VANCOCIN) IVPB 1000 mg/200 mL premix     1,000 mg 200 mL/hr over 60 Minutes Intravenous To Surgery 01/01/18 1120 01/02/18 1130  OBJECTIVE: Blood pressure (!) 144/97, pulse 77, temperature 98 F (36.7 C), temperature source Oral, resp. rate 20, SpO2 99 %.  Physical Exam  Constitutional: He is oriented to person, place, and time. He appears well-developed and well-nourished.  HENT:  Mouth/Throat: No oropharyngeal exudate.  Eyes: Pupils are equal, round, and reactive to light. EOM are normal.  Neck: Normal range of motion. Neck supple.  Cardiovascular: Normal rate, regular rhythm and normal heart sounds.  Pulmonary/Chest: Effort normal.  Abdominal: Soft. Bowel sounds are normal.  Musculoskeletal: He exhibits no edema.       Arms: Lymphadenopathy:    He has no cervical adenopathy.  Neurological: He is alert and oriented to person, place, and time.  Psychiatric: He has a normal mood and  affect.    Lab Results Results for orders placed or performed during the hospital encounter of 12/31/17 (from the past 48 hour(s))  Surgical pcr screen     Status: None   Collection Time: 12/31/17 10:00 AM  Result Value Ref Range   MRSA, PCR NEGATIVE NEGATIVE   Staphylococcus aureus NEGATIVE NEGATIVE    Comment: (NOTE) The Xpert SA Assay (FDA approved for NASAL specimens in patients 26 years of age and older), is one component of a comprehensive surveillance program. It is not intended to diagnose infection nor to guide or monitor treatment. Performed at [facility], [place] [address] [place], [place] [address]   CBC     Status: Abnormal   Collection Time: 12/31/17 10:01 AM  Result Value Ref Range   WBC 7.1 4.0 - 10.5 K/uL   RBC 5.82 (H) 4.22 - 5.81 MIL/uL   Hemoglobin 18.7 (H) 13.0 - 17.0 g/dL   HCT 55.1 (H) 39.0 - 52.0 %   MCV 94.7 78.0 - 100.0 fL   MCH 32.1 26.0 - 34.0 pg   MCHC 33.9 30.0 - 36.0 g/dL   RDW 13.0 11.5 - 15.5 %   Platelets 165 150 - 400 K/uL    Comment: Performed at [facility], [place] [address] [place], [place] [address]  Basic metabolic panel     Status: Abnormal   Collection Time: 12/31/17 10:01 AM  Result Value Ref Range   Sodium 141 135 - 145 mmol/L   Potassium 3.6 3.5 - 5.1 mmol/L   Chloride 105 98 - 111 mmol/L    Comment: Please note change in reference range.   CO2 27 22 - 32 mmol/L   Glucose, Bld 114 (H) 70 - 99 mg/dL    Comment: Please note change in reference range.   BUN 18 8 - 23 mg/dL    Comment: Please note change in reference range.   Creatinine, Ser 1.36 (H) 0.61 - 1.24 mg/dL   Calcium 9.9 8.9 - 10.3 mg/dL   GFR calc non Af Amer 55 (L) >60 mL/min   GFR calc Af Amer >60 >60 mL/min    Comment: (NOTE) The eGFR has been calculated using the CKD EPI equation. This calculation has not been validated in all clinical situations. eGFR's persistently <60 mL/min signify possible Chronic Kidney Disease.    Anion gap 9 5 - 15     Comment: Performed at [facility] [address], [place], [place] [address]      Component Value Date/Time   SDES TISSUE LEFT SHOULDER JOINT FLUID 12/08/2016 1411   SPECREQUEST SWABS SENT SPECIMEN B 12/08/2016 1411   CULT  12/08/2016 1411    RARE STAPHYLOCOCCUS SPECIES (COAGULASE NEGATIVE) FEW  PROPIONIBACTERIUM ACNES Standardized susceptibility testing for this organism is not available.    REPTSTATUS 12/14/2016 FINAL 12/08/2016 1411   No results found. Recent Results (from the past 240 hour(s))  Surgical pcr screen     Status: None   Collection Time: 12/31/17 10:00 AM  Result Value Ref Range Status   MRSA, PCR NEGATIVE NEGATIVE Final   Staphylococcus aureus NEGATIVE NEGATIVE Final    Comment: (NOTE) The Xpert SA Assay (FDA approved for NASAL specimens in patients 49 years of age and older), is one component of a comprehensive surveillance program. It is not intended to diagnose infection nor to guide or monitor treatment. Performed at [facility], [place] [address] [place], [place] [address]     Microbiology: Recent Results (from the past 240 hour(s))  Surgical pcr screen     Status: None   Collection Time: 12/31/17 10:00 AM  Result Value Ref Range Status   MRSA, PCR NEGATIVE NEGATIVE Final   Staphylococcus aureus NEGATIVE NEGATIVE Final    Comment: (NOTE) The Xpert SA Assay (FDA approved for NASAL specimens in patients 65 years of age and older), is one component of a comprehensive surveillance program. It is not intended to diagnose infection nor to guide or monitor treatment. Performed at [facility], [place] [address] [place], [place] [address]     Radiographs and labs were personally reviewed by me.   Bobby Rumpf, MD [facility] for Infectious [facility] [phone] 01/01/2018, 1:41 PM

## 2018-01-01 NOTE — Anesthesia Procedure Notes (Signed)
Procedure Name: LMA Insertion Date/Time: 01/01/2018 2:40 PM Performed by: Yarethzy Croak D, CRNA Pre-anesthesia Checklist: Patient identified, Emergency Drugs available, Suction available, Patient being monitored and Timeout performed Patient Re-evaluated:Patient Re-evaluated prior to induction Oxygen Delivery Method: Circle system utilized Preoxygenation: Pre-oxygenation with 100% oxygen Induction Type: IV induction Ventilation: Mask ventilation without difficulty LMA: LMA inserted LMA Size: 5.0 Number of attempts: 1

## 2018-01-01 NOTE — Op Note (Signed)
Date of Surgery: 01/01/2018  INDICATIONS: [name] is a 62 y.o.-year-old male with a left reverse shoulder arthroplasty.  He is status post conversion of a primary failed anatomic shoulder to this reverse arthroplasty.  The conversion surgery was performed by myself in June 2018.  At the time of the revision surgery he did have cultures positive for coagulase-negative staph as well as P acnes.  He was treated with 6 months of oral antibiotics.  He did very well.  About 6 weeks ago he developed increasing pain in the left shoulder following going for a swim.  On CT scan he was noted to have fractured to of the peripheral locking screws and had lucency around the baseplate.  This was concerning for recurrent infection so we elected to proceed with hardware removal and antibiotic spacer placement.;  The patient did consent to the procedure after discussion of the risks and benefits.  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  Failed left total shoulder arthroplasty  PROCEDURE:  1.  Explant of all components left reverse shoulder arthroplasty 2.  Insertion of antibiotic spacer left shoulder 3.  Open reduction and internal fixation of iatrogenic left proximal humerus fracture.  SURGEON: Geoffrey West, M.D.  ASSIST: Laure Kidney, RNFA.  ANESTHESIA:  general, and interscalene block left shoulder  IV FLUIDS AND URINE: See anesthesia.  ESTIMATED BLOOD LOSS: 350 mL.  IMPLANTS:  Biomet Prostalac humerus antibiotic spacer with gentamicin cement in addition of 2 g vancomycin.  Size 10 mm Zimmer  1.8 mm cerclage cable x1  DRAINS: None  COMPLICATIONS: None.  DESCRIPTION OF PROCEDURE: The patient was brought to the operating room and placed supine on the operating table.  The patient had been signed prior to the procedure and this was documented. The patient had the anesthesia placed by the anesthesiologist.  A time-out was performed to confirm that  this was the correct patient, site, side and location. The patient did not receive antibiotics prior to the incision but did receive antibiotics once intraoperative cultures were obtained and was re-dosed during the procedure as needed at indicated intervals.  A tourniquet not placed.    Once adequate anesthesia was obtained patient was set up into the beachchair position.  The head was padded and supported in a neutral position at the neck.  The patient had the operative extremity prepped and draped in the standard surgical fashion.      The former skin incision was reopened.  Dissection was carried down through scar tissue in the dermal and subcutaneous layer and into the deltopectoral interval.  This interval was identified by previous #2 FiberWire sutures left by me.  Next we spent some time addressing the scar tissue and mobilizing the deltoid and pectoralis muscle.  The sub-deltoid space was entered bluntly and releases were performed along the lateral humerus.  Care was taken to maintain the intact supraspinatus as well as posterior rotator cuff.  Next we moved medial where the conjoined tendon was scarred to the undersurface of the pectoralis.  We developed a plane just lateral to the conjoined tendon but did not go deep to that tendon.  This was retracted with deep retractors.  Next scar tissue was resected from the anterior lateral surface of the humerus.  We performed 360 degree releases of the humerus.  We were then able to dislocate the reverse shoulder arthroplasty.  Of note the humeral stem was very well fixed and there was no gross purulence.  The  pseudocapsule was opened prior to dislocation and a rush of fluid was encountered this was serous colored fluid.  This was swabbed and sent to microbiology for culture.  We also sent samples of the biofilm from the humeral side as well as glenosphere side.  Next we dislocated the humeral tray from the stem.  We then began to perform releases of the  metaphyseal portion of the humeral stem with flexible osteotomes.  We did this in a 360 degree fashion.  Once we felt we had adequate loosening of the stem we use the removal device to back slap the stem out of the humerus.  Unfortunately a posterior spiral fracture propagated all the way around to the biceps groove.  This was all in the metaphyseal region and proximal to the pectoralis insertion.  We at that point cleared a space to pass a provisional cable.  The Zimmer 2.8 mm cable was passed around this and gently secured to hold the fracture piece in place.  We left this for later tightening once the stem was in place.  At this point the humeral shaft was copiously lavaged with normal saline.  There was no gross purulence or bioburden noted.  We then turned our attention to the glenosphere.  Deep retractors were placed in the anterior, superior, and posterior glenoid bone.  Care was taken not to retract against the humerus due to the fracture.  We resected all soft tissue around the face of the glenosphere.  Some of this was sent for culture.  We then used the extractor device and were able to remove the glenosphere from the [facility] taper.  Next with the screwdriver we removed the central screw and then 4 peripheral locking screws.  Of note the superior and anterior peripheral locking screws were fractured.  We were not able to retrieve the tip of the screw as it was deep into the glenoid vault.  Next we did note some tissue that appeared to be metallosis in the portion vacated by the glenoid baseplate.  Also of note the glenoid baseplate was not grossly loose but did have some toggle noted.  With electrocautery and curette and Cobb elevator we removed the metal attic tissue.  This was also sent for culture.  At this point time we did dose with vancomycin.  Next we performed 360 degree excision of soft tissue around the glenoid face.  We rongeured any loose tissue and nonviable tissue.  There was healthy  bleeding tissue noted.  We then lavaged the glenoid face with 4 L of normal saline.   Next we turned our attention to impacting the antibiotic spacer.  Per the manufacturer's guidelines were used gentamicin infused cement that we added 2 g of vancomycin powder to to create the stem.  After trialing we chose the 10 mm size.  Once this was completely dried and hardened we impacted it into place.  Once the stem was impacted into place we then manually reduced the proximal humeral fracture.  The cable was then tightened one last time.  This was then crimped and cut.  I then move the arm through gentle passive range of motion and noted that the proximal fracture fragment moved in conjunction with the humeral shaft component.  The wound was again copiously irrigated.  We then lavaged the wound with hydrogen peroxide to help control any C acnes.  This was lavaged out with normal saline.  We then began closure.  The deltopectoral interval was closed with #1 PDS figure-of-eight.  We then closed subcutaneous dermis with 2-0 Monocryl.  The skin was closed in horizontal mattress 3-0 nylon.  The wound was cleaned and dried one final time and a sterile dressing was applied.  The arm was placed in a sling as well as around the waist strap.  All counts were correct x2.  There were no immediate intraoperative complications.  The patient was awakened from general anesthesia and transported to PACU in stable condition.  POSTOPERATIVE PLAN:  Geoffrey West will be admitted to my service for postoperative care.  He will be nonweightbearing to the left upper extremity due to proximal humerus fracture.  He can do gentle pendulums and scapular retractions.  He may move the elbow hand and wrist as tolerated.  I would like for him to wear the sling when he is not performing activities of daily living.  I will consult infectious disease to help with the care.  We will follow-up on intraoperative cultures.

## 2018-01-01 NOTE — H&P (Signed)
ORTHOPAEDIC H and P  REQUESTING PHYSICIAN: [name], MD  PCP:  Orpah Melter, MD  Chief Complaint: Left shoulder pain  HPI: Geoffrey West is a 62 y.o. male who complains of about a 54-month history now of increasing left shoulder pain.  He is now over a year out from a conversion of a failed anatomic total shoulder arthroplasty to a reverse.  At that procedure he was noted to have occult infection with P acnes.  That was treated with oral antibiotics for 6 months.  He did quite well in fact able to do pretty active weight lifting and physical activities to his satisfaction.  Then about 6-1/2 weeks ago while swimming he felt some increased pain in the left shoulder.  X-rays demonstrate some lucency around the glenoid and CT scan confirmed that and showed 2 fractured screws.  My suspicion is high for recurrent infection so he presents today for hardware removal and antibiotic spacer placement and admission for infectious disease consultation.  He denies any fevers, night sweats or chills.  He has had normal laboratory work-up to this point.  This includes normal white blood cell count, normal CRP and normal erythrocyte sedimentation rate.  Past Medical History:  Diagnosis Date  . Anxiety   . Arthritis   . GERD (gastroesophageal reflux disease)   . Gout 12/25/2016  . Hypertension   . Hypertension 12/25/2016  . Low testosterone 12/25/2016  . 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 BACK SURGERY    . COLONOSCOPY    . IR FLUORO GUIDE CV LINE RIGHT  12/26/2016  . LASIK    . REPLACEMENT TOTAL KNEE     both knees replacments                 (MULTIPLE SURGERIES BILATERAL KNEES PRIOR TO REPLACEMET)  . REVERSE SHOULDER ARTHROPLASTY Left 12/08/2016   Procedure: Revision left total shoulder to reverse total shoulder arthroplasty;  Surgeon: [name], MD;  Location: [place];  Service: Orthopedics;  Laterality: Left;  .  SHOULDER SURGERY    . SHOULDER SURGERY     RIGHT ARTHROPLASTY   (MULTIPLE SURGERIES)   Social History   Socioeconomic History  . Marital status: Single    Spouse name: Not on file  . Number of children: Not on file  . Years of education: Not on file  . Highest education level: Not on file  Occupational History  . Not on file  Social Needs  . Financial resource strain: Not on file  . Food insecurity:    Worry: Not on file    Inability: Not on file  . Transportation needs:    Medical: Not on file    Non-medical: Not on file  Tobacco Use  . Smoking status: Never Smoker  . Smokeless tobacco: Never Used  Substance and Sexual Activity  . Alcohol use: No  . Drug use: No  . Sexual activity: Not on file  Lifestyle  . Physical activity:    Days per week: Not on file    Minutes per session: Not on file  . Stress: Not on file  Relationships  . Social connections:    Talks on phone: Not on file    Gets together: Not on file    Attends religious service: Not on file    Active member of club or organization: Not on file    Attends meetings of clubs or organizations: Not on  file    Relationship status: Not on file  Other Topics Concern  . Not on file  Social History Narrative  . Not on file   Family History  Problem Relation Age of Onset  . Arthritis Mother   . Arthritis Sister   . Alzheimer's disease Brother   . Arthritis Brother    Allergies  Allergen Reactions  . Fentanyl Shortness Of Breath    Reaction to [facility] ONLY > ? DOSE REGULATION ?   [name] Oxycodone Other (See Comments)    Pt states he goes through hot and cold flashes withdrawal like symptoms   Prior to Admission medications   Medication Sig Start Date End Date Taking? Authorizing Provider  allopurinol (ZYLOPRIM) 300 MG tablet Take 300 mg by mouth daily.   Yes [provider]  amLODipine (NORVASC) 10 MG tablet Take 10 mg by mouth at bedtime.    Yes [provider]  celecoxib (CELEBREX) 200 MG  capsule Take 200 mg by mouth 2 (two) times daily. 11/06/16  Yes [provider]  clonazePAM (KLONOPIN) 0.5 MG tablet Take 0.5 mg by mouth at bedtime.   Yes [provider]  fluticasone (FLONASE) 50 MCG/ACT nasal spray Place 2 sprays into both nostrils daily. For allergies. 11/24/16  Yes [provider]  Multiple Vitamin (MULTIVITAMIN WITH MINERALS) TABS tablet Take 1 tablet by mouth daily.   Yes [provider]  propranolol ER (INDERAL LA) 120 MG 24 hr capsule Take 1 capsule (120 mg total) by mouth daily. 12/25/16  Yes Tommy Medal, Lavell Islam, MD  testosterone cypionate (DEPOTESTOSTERONE CYPIONATE) 200 MG/ML injection Inject 100 mg into the muscle every Wednesday. 08/30/16  Yes [provider]  Ubiquinol 100 MG CAPS Take 100 mg by mouth daily.   Yes [provider]  amoxicillin-clavulanate (AUGMENTIN) 875-125 MG tablet TAKE 1 TABLET BY MOUTH TWICE A DAY Patient not taking: Reported on 12/25/2017 06/25/17   Tommy Medal, Lavell Islam, MD   No results found.  Positive ROS: All other systems have been reviewed and were otherwise negative with the exception of those mentioned in the HPI and as above.  Physical Exam: General: Alert, no acute distress Cardiovascular: No pedal edema Respiratory: No cyanosis, no use of accessory musculature GI: No organomegaly, abdomen is soft and non-tender Skin: No lesions in the area of chief complaint Neurologic: Sensation intact distally Psychiatric: Patient is competent for consent with normal mood and affect Lymphatic: No axillary or cervical lymphadenopathy  MUSCULOSKELETAL:  Left shoulder demonstrates well-healed deltopectoral incision with some atrophy of the anterior deltoid.  Otherwise no signs of infection.  No warmth, erythema, or drainage.  Neurovascularly intact.  He has pain at any forward elevation over 90 degrees.  Assessment: 1.  Loose left reverse shoulder arthroplasty.  Concern for  infection.  Plan: -Timing I have discussed in the clinic and over the phone at length the nature of his loose left reverse arthroplasty.  Plan is for explant today and antibiotic spacer placement with cultures to be taken at the time of the surgery today.  He has had preoperative work-up for extensive for infection has been negative including aspiration, preoperative screening labs, and clinical evaluation.  However, on his CT scan there is lucency with 2 fractured screws concerning for septic loosening. -We again discussed the risk benefits of this procedure as well as the recovery.  We will plan to admit him postoperatively for factious disease consultation as well as work-up of his right shoulder pain with a  CT scan looking for a lucency there as he has a history of right total shoulder arthroplasty. -Informed consent obtained and signed on chart.    [name], MD Cell [phone]    01/01/2018 12:31 PM

## 2018-01-01 NOTE — Progress Notes (Signed)
PHARMACIST - PHYSICIAN ORDER COMMUNICATION  CONCERNING: P&T Medication Policy on Herbal Medications  DESCRIPTION:  This patient's order for:  Ubiquinol  has been noted.  This product(s) is classified as an "herbal" or natural product. Due to a lack of definitive safety studies or FDA approval, nonstandard manufacturing practices, plus the potential risk of unknown drug-drug interactions while on inpatient medications, the Pharmacy and Therapeutics Committee does not permit the use of "herbal" or natural products of this type within [employer]   ACTION TAKEN: The pharmacy department is unable to verify this order at this time and your patient has been informed of this safety policy. Please reevaluate patient's clinical condition at discharge and address if the herbal or natural product(s) should be resumed at that time.  Thanks, Peggyann Juba, PharmD, [facility]: [phone]

## 2018-01-01 NOTE — Anesthesia Preprocedure Evaluation (Signed)
Anesthesia Evaluation  Patient identified by MRN, date of birth, ID band Patient awake    Reviewed: Allergy & Precautions, NPO status , Patient's Chart, lab work & pertinent test results  Airway Mallampati: II  TM Distance: >3 FB Neck ROM: Full    Dental no notable dental hx. (+) Teeth Intact   Pulmonary neg pulmonary ROS,    Pulmonary exam normal breath sounds clear to auscultation       Cardiovascular hypertension, Pt. on medications Normal cardiovascular exam Rhythm:Regular Rate:Normal     Neuro/Psych negative neurological ROS  negative psych ROS   GI/Hepatic Neg liver ROS, GERD  ,  Endo/Other  negative endocrine ROS  Renal/GU negative Renal ROS     Musculoskeletal negative musculoskeletal ROS (+)   Abdominal   Peds  Hematology negative hematology ROS (+)   Anesthesia Other Findings   Reproductive/Obstetrics negative OB ROS                             Anesthesia Physical  Anesthesia Plan  ASA: II  Anesthesia Plan: General and Regional   Post-op Pain Management: GA combined w/ Regional for post-op pain   Induction: Intravenous  PONV Risk Score and Plan: 2 and Ondansetron, Propofol and Midazolam  Airway Management Planned: Oral ETT  Additional Equipment:   Intra-op Plan:   Post-operative Plan: Extubation in OR  Informed Consent: I have reviewed the patients History and Physical, chart, labs and discussed the procedure including the risks, benefits and alternatives for the proposed anesthesia with the patient or authorized representative who has indicated his/her understanding and acceptance.   Dental advisory given  Plan Discussed with: CRNA  Anesthesia Plan Comments:         Anesthesia Quick Evaluation

## 2018-01-01 NOTE — Progress Notes (Signed)
Dr [name] at bedside:  Xray to be done on floor, CT for tomorrow(on opposite shoulder)

## 2018-01-01 NOTE — Anesthesia Procedure Notes (Signed)
Anesthesia Regional Block: Interscalene brachial plexus block   Pre-Anesthetic Checklist: ,, timeout performed, Correct Patient, Correct Site, Correct Laterality, Correct Procedure, Correct Position, site marked, Risks and benefits discussed,  Surgical consent,  Pre-op evaluation,  At surgeon's request and post-op pain management  Laterality: Left  Prep: chloraprep       Needles:  Injection technique: Single-shot  Needle Type: Stimiplex     Needle Length: 9cm  Needle Gauge: 21     Additional Needles:   Procedures:,,,, ultrasound used (permanent image in chart),,,,  Narrative:  Start time: 01/01/2018 12:54 PM End time: 01/01/2018 12:59 PM Injection made incrementally with aspirations every 5 mL.  Performed by: Personally  Anesthesiologist: Lynda Rainwater, MD

## 2018-01-01 NOTE — Transfer of Care (Signed)
Immediate Anesthesia Transfer of Care Note  Patient: Geoffrey West [facility]  Procedure(s) Performed: Left reverse shouler explant with antibiotic spacer placement (Left Shoulder)  Patient Location: PACU  Anesthesia Type:General and Regional  Level of Consciousness: awake, alert  and oriented  Airway & Oxygen Therapy: Patient Spontanous Breathing and Patient connected to face mask oxygen  Post-op Assessment: Report given to RN and Post -op Vital signs reviewed and stable  Post vital signs: Reviewed and stable  Last Vitals:  Vitals Value Taken Time  BP 125/84 01/01/2018  5:55 PM  Temp 36.6 C 01/01/2018  5:52 PM  Pulse 91 01/01/2018  5:56 PM  Resp 25 01/01/2018  5:56 PM  SpO2 90 % 01/01/2018  5:56 PM  Vitals shown include unvalidated device data.  Last Pain:  Vitals:   01/01/18 1155  TempSrc:   PainSc: 0-No pain      Patients Stated Pain Goal: 3 (22/63/33 5456)  Complications: No apparent anesthesia complications

## 2018-01-01 NOTE — Brief Op Note (Signed)
01/01/2018  5:49 PM  PATIENT:  Geoffrey West  62 y.o. male  PRE-OPERATIVE DIAGNOSIS:  Left shoulder replacement loosening  POST-OPERATIVE DIAGNOSIS:  Left shoulder replacement loosening  PROCEDURE:  Procedure(s) with comments: Left reverse shouler explant with antibiotic spacer placement (Left) - 2.5 hrs  SURGEON:  Surgeon(s) and Role:    * [name], MD - Primary  PHYSICIAN ASSISTANT:   ASSISTANTS: Laure Kidney, RNFA   ANESTHESIA:   regional and general  EBL:  350 mL   BLOOD ADMINISTERED:none  DRAINS: none   LOCAL MEDICATIONS USED:  NONE  SPECIMEN:  Source of Specimen:  Multiple superficial and deep soft tissue cultures as well as fluid aspirates.  All of these were sent to the microbiology lab.  DISPOSITION OF SPECIMEN:  Micro  COUNTS:  YES  TOURNIQUET:  * No tourniquets in log *  DICTATION: .Note written in EPIC  PLAN OF CARE: Admit to inpatient   PATIENT DISPOSITION:  PACU - hemodynamically stable.   Delay start of Pharmacological VTE agent (>24hrs) due to surgical blood loss or risk of bleeding: not applicable

## 2018-01-01 NOTE — Progress Notes (Signed)
5th floor notified pt will be in 1526 in 20 minutes.

## 2018-01-02 ENCOUNTER — Inpatient Hospital Stay (HOSPITAL_COMMUNITY): Payer: PPO

## 2018-01-02 ENCOUNTER — Encounter (HOSPITAL_COMMUNITY): Payer: Self-pay | Admitting: Orthopedic Surgery

## 2018-01-02 LAB — C-REACTIVE PROTEIN: CRP: 1.3 mg/dL — ABNORMAL HIGH (ref <1.0)

## 2018-01-02 LAB — SEDIMENTATION RATE: Sed Rate: 2 mm/hr (ref 0–16)

## 2018-01-02 MED ORDER — SODIUM CHLORIDE 0.9 % IV SOLN
2.0000 g | INTRAVENOUS | Status: DC
  Administered 2018-01-02: 2 g via INTRAVENOUS
  Filled 2018-01-02 (×2): qty 20

## 2018-01-02 NOTE — Progress Notes (Signed)
   Subjective:  Patient reports pain as mild.  He feels as though the block is beginning to wear off but no real pain.  He denies shortness of breath or chest pain.  No nausea or vomiting.  Objective:   VITALS:   Vitals:   01/02/18 1011 01/02/18 1402 01/02/18 1844 01/02/18 2114  BP: 124/86 117/69 123/81 (!) 145/90  Pulse: 70 69 66 66  Resp: 18 20  18   Temp: 97.9 F (36.6 C) 97.6 F (36.4 C) 98.1 F (36.7 C) 98.1 F (36.7 C)  TempSrc: Oral Oral Oral Oral  SpO2: 95% 96% 96% 97%  Weight:      Height:        Neurologically intact Motor intact throughout the left upper extreme he.  Decreased sensation noted in the hand and forearm but intact in the axillary nerve and brachial nerves. Bandages clean dry and intact. Good 2+ radial pulse.  Lab Results  Component Value Date   WBC 7.1 12/31/2017   HGB 18.7 (H) 12/31/2017   HCT 55.1 (H) 12/31/2017   MCV 94.7 12/31/2017   PLT 165 12/31/2017   BMET    Component Value Date/Time   NA 141 12/31/2017 1001   K 3.6 12/31/2017 1001   CL 105 12/31/2017 1001   CO2 27 12/31/2017 1001   GLUCOSE 114 (H) 12/31/2017 1001   BUN 18 12/31/2017 1001   CREATININE 1.36 (H) 12/31/2017 1001   CREATININE 1.25 03/01/2017 1636   CALCIUM 9.9 12/31/2017 1001   GFRNONAA 55 (L) 12/31/2017 1001   GFRNONAA 62 03/01/2017 1636   GFRAA >60 12/31/2017 1001   GFRAA 72 03/01/2017 1636     Assessment/Plan: 1 Day Post-Op   Active Problems:   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 - Okay for pendulums and scapular retraction.  Okay for full active and passive range motion at the elbow, hand and wrist.  - Appreciate infectious diseases input.  We will follow along.  Due to the delayed nature of acne is cultures we would tentatively like to allow patient to discharge home before cultures are final.  Certainly we will follow up on infectious disease wrecks.  - Maintain  inpatient for pain control as well as follow-up on cultures and continue IV antibiotics at this time.   [name] 01/02/2018, 11:15 PM   Geralynn Rile, MD [phone]

## 2018-01-02 NOTE — Anesthesia Postprocedure Evaluation (Signed)
Anesthesia Post Note  Patient: Geoffrey West [facility]  Procedure(s) Performed: Left reverse shouler explant with antibiotic spacer placement (Left Shoulder)     Patient location during evaluation: PACU Anesthesia Type: Regional and General Level of consciousness: awake and alert Pain management: pain level controlled Vital Signs Assessment: post-procedure vital signs reviewed and stable Respiratory status: spontaneous breathing, nonlabored ventilation and respiratory function stable Cardiovascular status: blood pressure returned to baseline and stable Postop Assessment: no apparent nausea or vomiting Anesthetic complications: no    Last Vitals:  Vitals:   01/02/18 1402 01/02/18 1844  BP: 117/69 123/81  Pulse: 69 66  Resp: 20   Temp: 36.4 C 36.7 C  SpO2: 96% 96%    Last Pain:  Vitals:   01/02/18 1844  TempSrc: Oral  PainSc:    Pain Goal: Patients Stated Pain Goal: 3 (01/01/18 1155)               Lynda Rainwater

## 2018-01-02 NOTE — Plan of Care (Signed)
Plan of care discussed with patient 

## 2018-01-02 NOTE — Evaluation (Signed)
Occupational Therapy Evaluation Patient Details Name: Geoffrey West MRN: [id number] DOB: [birth year] Today's Date: 01/02/2018    History of Present Illness Geoffrey West is a 62 y.o.-year-old male with a left reverse shoulder arthroplasty.  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 Pt currently with functional limitations due to the deficits listed below (see OT Problem List).  Pt will benefit from skilled OT to increase their safety and independence with ADL and functional mobility for ADL to facilitate discharge to venue listed below.      Follow Up Recommendations  Follow surgeon's recommendation for DC plan and follow-up therapies    Equipment Recommendations  None recommended by OT    Recommendations for Other Services       Precautions / Restrictions Precautions Precautions: Shoulder Type of Shoulder Precautions: Ok for pendulums, and scapular retractions left arm.  AROM and PROM as tolerated at the elbow, hand/wrist.  No weight bearing left arm.  (ok to use hand/wrist when arm in sling).  Maintain sling unless doing ADLs or OT. Shoulder Interventions: Off for dressing/bathing/exercises;Shoulder sling/immobilizer Restrictions Weight Bearing Restrictions: Yes LUE Weight Bearing: Non weight bearing      Mobility Bed Mobility Overal bed mobility: Modified Independent                Transfers Overall transfer level: Modified independent                    Balance Overall balance assessment: No apparent balance deficits (not formally assessed)                                         ADL either performed or assessed with clinical judgement   ADL Overall ADL's : Needs assistance/impaired                 Upper Body Dressing : Minimal assistance;Standing   Lower Body Dressing: Minimal assistance;Sit to/from stand;Cueing  for sequencing;Cueing for safety   Toilet Transfer: Supervision/safety;Ambulation;Comfort height toilet   Toileting- Clothing Manipulation and Hygiene: Supervision/safety;Sit to/from stand;Cueing for sequencing;Cueing for safety   Tub/ Shower Transfer: Supervision/safety   Functional mobility during ADLs: Supervision/safety       Vision Patient Visual Report: No change from baseline              Pertinent Vitals/Pain Pain Assessment: 0-10 Pain Score: 1  Pain Location: nerve block still active Pain Intervention(s): Monitored during session     Hand Dominance     Extremity/Trunk Assessment Upper Extremity Assessment Upper Extremity Assessment: LUE deficits/detail LUE Deficits / Details: s/p shoulder surgery       Cervical / Trunk Assessment Cervical / Trunk Assessment: Normal   Communication Communication Communication: No difficulties   Cognition   Behavior During Therapy: WFL for tasks assessed/performed Overall Cognitive Status: Within Functional Limits for tasks assessed                                        Exercises  Pt able to perform pendulum exercise as well as elbow flexion/extension with A of OT.  Pt not with full control as nerve block still active.   Shoulder Instructions  handout provided    Home  Living Family/patient expects to be discharged to:: Private residence Living Arrangements: Alone Available Help at Discharge: Family Type of Home: House Home Access: Stairs to enter     Home Layout: One level               Home Equipment: None          Prior Functioning/Environment Level of Independence: Independent                 OT Problem List: Decreased strength;Decreased activity tolerance;Decreased knowledge of precautions;Impaired UE functional use      OT Treatment/Interventions: Self-care/ADL training;Patient/family education;Therapeutic activities    OT Goals(Current goals can be found in the care  plan section) Acute Rehab OT Goals Patient Stated Goal: go home  OT Goal Formulation: With patient Time For Goal Achievement: 01/16/18 Potential to Achieve Goals: Good ADL Goals Pt Will Perform Upper Body Bathing: with modified independence;standing Pt Will Perform Upper Body Dressing: with modified independence Pt Will Transfer to Toilet: with modified independence Pt Will Perform Toileting - Clothing Manipulation and hygiene: with modified independence Pt/caregiver will Perform Home Exercise Program: With written HEP provided  OT Frequency: Min 2X/week   Barriers to D/C: Decreased caregiver support          Co-evaluation              AM-PAC PT "6 Clicks" Daily Activity     Outcome Measure Help from another person eating meals?: None Help from another person taking care of personal grooming?: None Help from another person toileting, which includes using toliet, bedpan, or urinal?: A Little Help from another person bathing (including washing, rinsing, drying)?: A Little Help from another person to put on and taking off regular upper body clothing?: A Little Help from another person to put on and taking off regular lower body clothing?: A Little 6 Click Score: 20   End of Session Nurse Communication: Mobility status  Activity Tolerance: Patient tolerated treatment well Patient left: in bed;with call bell/phone within reach  OT Visit Diagnosis: Muscle weakness (generalized) (M62.81)                Time: 0350-0938 OT Time Calculation (min): 28 min Charges:  OT General Charges $OT Visit: 1 Visit OT Evaluation $OT Eval Low Complexity: 1 Low OT Treatments $Self Care/Home Management : 8-22 mins G-Codes:     Kari Baars, OT [phone]  Payton Mccallum D 01/02/2018, 11:38 AM

## 2018-01-03 DIAGNOSIS — T8459XD Infection and inflammatory reaction due to other internal joint prosthesis, subsequent encounter: Secondary | ICD-10-CM

## 2018-01-03 MED ORDER — CEPHALEXIN 500 MG PO CAPS: 500.0000 mg | ORAL_CAPSULE | Freq: Four times a day (QID) | ORAL | 0 refills | Status: DC

## 2018-01-03 MED ORDER — TRAMADOL HCL 50 MG PO TABS: 50.0000 mg | ORAL_TABLET | Freq: Four times a day (QID) | ORAL | 0 refills | Status: DC | PRN

## 2018-01-03 MED ORDER — CEPHALEXIN 500 MG PO CAPS
500.0000 mg | ORAL_CAPSULE | Freq: Four times a day (QID) | ORAL | Status: DC
  Administered 2018-01-03: 500 mg via ORAL
  Filled 2018-01-03: qty 1

## 2018-01-03 NOTE — Progress Notes (Signed)
Discharge instructions discussed with patient, verbalized agreement and understanding, 

## 2018-01-03 NOTE — Progress Notes (Signed)
[facility] Coordinator will follow pt with ID to support [facility] for home IV ABX at [place]. [facility] will partner with patients [facility] agency of choice if other than [facility].  If patient discharges after hours, please call [phone].   Larry Sierras 01/03/2018, 7:36 AM

## 2018-01-03 NOTE — Discharge Instructions (Signed)
D/C:  - daily dry dressing changes to left shoulder.  Apply ice to incision 20-30 minutes per hour when awake - ok for pendulums and scapular retractions at the shoulder - no weight bearing to the left arm, wear silng - ok to begin showering on Saturday. - return to see Dr. [name] in 2 weeks

## 2018-01-03 NOTE — Progress Notes (Signed)
Patient noticed swelling at his left shoulder's incision, RN assessed the site and noted large amount of blood under the Hydrocolloid dressing, RN pulled the dressing off, and noticed a large amount of blood drainage and measured 100 ml of blood, stitches  Look intact, site was cleaned and covered with 4x4 and ABD pad with hypofix tape using sterile technic, MD was notified and stated to monitor the site. No further bleeding noted

## 2018-01-03 NOTE — Progress Notes (Addendum)
INFECTIOUS DISEASE PROGRESS NOTE  ID: Geoffrey West is a 62 y.o. 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   Abtx:  Anti-infectives (From admission, onward)   Start     Dose/Rate Route Frequency Ordered Stop   01/02/18 1600  cefTRIAXone (ROCEPHIN) 2 g in sodium chloride 0.9 % 100 mL IVPB     2 g 200 mL/hr over 30 Minutes Intravenous Every 24 hours 01/02/18 1418     01/02/18 0400  vancomycin (VANCOCIN) IVPB 1000 mg/200 mL premix     1,000 mg 200 mL/hr over 60 Minutes Intravenous Every 12 hours 01/01/18 1819 01/02/18 0416   01/01/18 1653  vancomycin (VANCOCIN) powder  Status:  Discontinued       As needed 01/01/18 1653 01/01/18 1749   01/01/18 1130  vancomycin (VANCOCIN) IVPB 1000 mg/200 mL premix     1,000 mg 200 mL/hr over 60 Minutes Intravenous To Surgery 01/01/18 1120 01/01/18 1731      Medications:  Scheduled: . allopurinol  300 mg Oral Daily  . amLODipine  10 mg Oral QHS  . aspirin EC  325 mg Oral Daily  . celecoxib  200 mg Oral BID  . clonazePAM  0.5 mg Oral QHS  . docusate sodium  100 mg Oral BID  . gabapentin  300 mg Oral TID  . propranolol ER  120 mg Oral QHS  . traMADol  50 mg Oral Q6H    Objective: Vital signs in last 24 hours: Temp:  [97.5 F (36.4 C)-98.3 F (36.8 C)] 98.3 F (36.8 C) (07/11 0539) Pulse Rate:  [61-68] 61 (07/11 0539) Resp:  [18] 18 (07/11 0539) BP: (123-145)/(81-90) 132/88 (07/11 0539) SpO2:  [96 %-97 %] 96 % (07/11 0539)   General appearance: alert, cooperative and no distress Resp: clear to auscultation bilaterally Cardio: regular rate and rhythm GI: normal findings: bowel sounds normal and soft, non-tender Extremities: L shoulder dressed.   Lab Results No results for input(s): WBC, HGB, HCT, NA, K, CL, CO2, BUN, CREATININE, GLU in the last 72 hours.  Invalid input(s): PLATELETS Liver Panel No results for input(s): PROT, ALBUMIN,  AST, ALT, ALKPHOS, BILITOT, BILIDIR, IBILI in the last 72 hours. Sedimentation Rate Recent Labs    01/02/18 1432  ESRSEDRATE 2   C-Reactive Protein Recent Labs    01/02/18 1432  CRP 1.3*    Microbiology: Recent Results (from the past 240 hour(s))  Surgical pcr screen     Status: None   Collection Time: 12/31/17 10:00 AM  Result Value Ref Range Status   MRSA, PCR NEGATIVE NEGATIVE Final   Staphylococcus aureus NEGATIVE NEGATIVE Final    Comment: (NOTE) The Xpert SA Assay (FDA approved for NASAL specimens in patients 30 years of age and older), is one component of a comprehensive surveillance program. It is not intended to diagnose infection nor to guide or monitor treatment. Performed at [facility], [place] [address] [place], [place] [address]   Aerobic/Anaerobic Culture (surgical/deep wound)     Status: None (Preliminary result)   Collection Time: 01/01/18  3:12 PM  Result Value Ref Range Status   Specimen Description   Final    SYNOVIAL LEFT SHOULDER Performed at [place] [address], [place], [place] [address]    Special Requests   Final    NONE Performed at [facility], [place] [address], [place], [place] [address]  Gram Stain   Final    RARE WBC PRESENT,BOTH PMN AND MONONUCLEAR NO ORGANISMS SEEN    Culture   Final    NO GROWTH 2 DAYS NO ANAEROBES ISOLATED; CULTURE IN PROGRESS FOR 5 DAYS Performed at [facility], [place] [address] [place], [place] [address]    Report Status PENDING  Incomplete  Aerobic/Anaerobic Culture (surgical/deep wound)     Status: None (Preliminary result)   Collection Time: 01/01/18  3:22 PM  Result Value Ref Range Status   Specimen Description   Final    TISSUE LEFT SHOULDER Performed at [place] [address] [place], [place] [address]    Special Requests   Final    NONE Performed at [facility], [place] [address] [place],  [place] [address]    Gram Stain   Final    FEW WBC PRESENT, PREDOMINANTLY MONONUCLEAR NO ORGANISMS SEEN    Culture   Final    NO GROWTH 2 DAYS NO ANAEROBES ISOLATED; CULTURE IN PROGRESS FOR 5 DAYS Performed at [place] [address], [place], [place] [address]    Report Status PENDING  Incomplete  Aerobic/Anaerobic Culture (surgical/deep wound)     Status: None (Preliminary result)   Collection Time: 01/01/18  4:12 PM  Result Value Ref Range Status   Specimen Description   Final    TISSUE LEFT SHOULDER Performed at [place] [address], [place], [place] [address]    Special Requests   Final    NONE Performed at [facility], [place] [address], [place], [place] [address]    Gram Stain   Final    MODERATE WBC PRESENT, PREDOMINANTLY MONONUCLEAR NO ORGANISMS SEEN    Culture   Final    NO GROWTH 2 DAYS NO ANAEROBES ISOLATED; CULTURE IN PROGRESS FOR 5 DAYS Performed at [place] [address] [place], [place] [address]    Report Status PENDING  Incomplete  Aerobic/Anaerobic Culture (surgical/deep wound)     Status: None (Preliminary result)   Collection Time: 01/01/18  4:23 PM  Result Value Ref Range Status   Specimen Description   Final    SYNOVIAL LEFT SHOULDER Performed at [place] [address], [place], [place] [address]    Special Requests   Final    NONE Performed at [facility], [place] [address] [place], [place] [address]    Gram Stain   Final    MODERATE WBC PRESENT, PREDOMINANTLY MONONUCLEAR NO ORGANISMS SEEN    Culture   Final    NO GROWTH 2 DAYS NO ANAEROBES ISOLATED; CULTURE IN PROGRESS FOR 5 DAYS Performed at [place] [address] [place], [place] [address]    Report Status PENDING  Incomplete    Studies/Results: Ct Shoulder Right Wo Contrast  Result Date: 01/02/2018 CLINICAL DATA:  Chronic progressive right shoulder pain. Previous right  shoulder replacement. EXAM: CT OF THE UPPER RIGHT EXTREMITY WITHOUT CONTRAST TECHNIQUE: Multidetector CT imaging of the upper right extremity was performed according to the standard protocol. COMPARISON:  None. FINDINGS: Bones/Joint/Cartilage The humeral component of the total shoulder prosthesis appears in excellent position with no evidence of loosening. The humerus is superiorly subluxed with respect to the glenoid. The glenoid component of the prosthesis is not radiodense and cannot not be assessed. There are small areas of erosion of the glenoid. 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 Muscles and Tendons No atrophy of the muscles of the rotator cuff. Soft tissues No appreciable joint effusions or other significant abnormalities. IMPRESSION: 1. Ununited os acromiale. Minimal degenerative changes of the [facility] joint. 2. Slight superior subluxation of the proximal humerus with respect to the glenoid. 3. The glenoid component of the prosthesis cannot be assessed since it is not radiodense. There are some small areas of erosion of the glenoid. 4. Humeral component of the prosthesis demonstrates no evidence of loosening. Electronically Signed   By: Lorriane Shire M.D.   On: 01/02/2018 11:11   Dg Shoulder Left Port  Result Date: 01/01/2018 CLINICAL DATA:  Removal of left shoulder replacement hardware with antibiotic spacer placement. EXAM: LEFT SHOULDER - 1 VIEW COMPARISON:  None. FINDINGS: The left shoulder hardware has been removed. An antibiotic spacer has been placed. A cerclage wire seen. No dislocation based on the transscapular Y-view. IMPRESSION: Postsurgical changes as above. 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  Will start him on keflex 500mg  qid Have him f/u in ID clinic.  Ready for d/c from my perspective.  Will f/u op Cx.          Bobby Rumpf MD, FACP Infectious Diseases (pager) [phone] [email or web address] 01/03/2018, 2:17 PM  LOS: 2 days

## 2018-01-03 NOTE — Progress Notes (Signed)
   Subjective:  Patient reports pain as mild.  Block has resolved.  He had a episdoe last night of dressing draining out and a fair amount of bleeding.  That has resolved with ABD pressure dressing.  He denies shortness of breath or chest pain.  No nausea or vomiting.  Objective:   VITALS:   Vitals:   01/02/18 1844 01/02/18 2114 01/03/18 0135 01/03/18 0539  BP: 123/81 (!) 145/90 127/86 132/88  Pulse: 66 66 68 61  Resp:  18 18 18   Temp: 98.1 F (36.7 C) 98.1 F (36.7 C) (!) 97.5 F (36.4 C) 98.3 F (36.8 C)  TempSrc: Oral Oral Oral Oral  SpO2: 96% 97% 96% 96%  Weight:      Height:        Neurologically intact Motor intact throughout the left upper extreme he.  Intact sensation noted in the hand and forearm including the axillary nerve . Bandages clean dry and intact. Good 2+ radial pulse.  Lab Results  Component Value Date   WBC 7.1 12/31/2017   HGB 18.7 (H) 12/31/2017   HCT 55.1 (H) 12/31/2017   MCV 94.7 12/31/2017   PLT 165 12/31/2017   BMET    Component Value Date/Time   NA 141 12/31/2017 1001   K 3.6 12/31/2017 1001   CL 105 12/31/2017 1001   CO2 27 12/31/2017 1001   GLUCOSE 114 (H) 12/31/2017 1001   BUN 18 12/31/2017 1001   CREATININE 1.36 (H) 12/31/2017 1001   CREATININE 1.25 03/01/2017 1636   CALCIUM 9.9 12/31/2017 1001   GFRNONAA 55 (L) 12/31/2017 1001   GFRNONAA 62 03/01/2017 1636   GFRAA >60 12/31/2017 1001   GFRAA 72 03/01/2017 1636     Assessment/Plan: 2 Days Post-Op   Active Problems:   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 - Okay for pendulums and scapular retraction.  Okay for full active and passive range motion at the elbow, hand and wrist.  - daily dry dressing changes with ABd pad  - Appreciate infectious diseases input.  We will follow along.   - would like to dc home today if recs final from ID, otherwise will maintain in house  - DC home when  all recs final   [name] 01/03/2018, 11:39 AM   Geralynn Rile, MD [phone]

## 2018-01-03 NOTE — Progress Notes (Signed)
Occupational Therapy Treatment Patient Details Name: Geoffrey West MRN: [id number] DOB: 1955-07-07 Today's Date: 01/03/2018    History of present illness Geoffrey West is a 62 y.o.-year-old male with a left reverse shoulder arthroplasty.  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  AROM and PROM as tolerated at the elbow, hand/wrist.  No weight bearing left arm.  (ok to use hand/wrist when arm in sling).  Maintain sling unless doing ADLs or OT. Shoulder Interventions: Off for dressing/bathing/exercises;Shoulder sling/immobilizer Restrictions Weight Bearing Restrictions: Yes LUE Weight Bearing: Non weight bearing       Mobility Bed Mobility Overal bed mobility: Modified Independent                Transfers Overall transfer level: Modified independent                    Balance Overall balance assessment: No apparent balance deficits (not formally assessed)                                         ADL either performed or assessed with clinical judgement   ADL Overall ADL's : Needs assistance/impaired                 Upper Body Dressing : Modified independent   Lower Body Dressing: Modified independent   Toilet Transfer: Modified Independent   Toileting- Clothing Manipulation and Hygiene: Modified independent               Vision Patient Visual Report: No change from baseline            Cognition Arousal/Alertness: Awake/alert Behavior During Therapy: WFL for tasks assessed/performed Overall Cognitive Status: Within Functional  Limits for tasks assessed                                          Exercises Shoulder Exercises Pendulum Exercise: AAROM;Left;Standing;10 reps Elbow Flexion: AROM;Left;10 reps Elbow Extension: AROM;Left;10 reps Wrist Flexion: AROM;Left;10 reps Wrist Extension: AROM;Left;10 reps Digit Composite Flexion: AROM;Left;10 reps Composite Extension: AROM;Right;Left   Shoulder Instructions Shoulder Instructions Donning/doffing shirt without moving shoulder: Independent Method for sponge bathing under operated UE: Independent Donning/doffing sling/immobilizer: Independent Correct positioning of sling/immobilizer: Independent Pendulum exercises (written home exercise program): Independent ROM for elbow, wrist and digits of operated UE: Independent Sling wearing schedule (on at all times/off for ADL's): Independent Proper positioning of operated UE when showering: Independent Positioning of UE while sleeping: Independent     General Comments      Pertinent Vitals/ Pain       Pain Score: 2  Pain Descriptors / Indicators: Discomfort Pain Intervention(s): Monitored during session         Frequency  Min 2X/week        Progress Toward Goals  OT Goals(current goals can now be found in the care plan section)  Progress towards OT goals: Progressing toward goals  Plan Discharge plan remains appropriate       AM-PAC PT "6 Clicks" Daily Activity     Outcome Measure   Help from another person eating meals?: None Help from another person taking care of personal grooming?: None Help from another person toileting, which includes using toliet, bedpan, or urinal?: None Help from another person bathing (including washing, rinsing, drying)?: None Help from another person to put on and taking off regular upper body clothing?: None Help from another person to put on and taking off regular lower body clothing?: None 6 Click Score: 24    End of Session    OT Visit  Diagnosis: Muscle weakness (generalized) (M62.81)   Activity Tolerance Patient tolerated treatment well   Patient Left in bed;with call bell/phone within reach   Nurse Communication Mobility status        Time: 2035-5974 OT Time Calculation (min): 12 min  Charges: OT General Charges $OT Visit: 1 Visit OT Treatments $Therapeutic Activity: 8-22 mins  [place], [place] [phone]   Geoffrey West 01/03/2018, 1:03 PM

## 2018-01-03 NOTE — Plan of Care (Signed)
Pt to d/c home. Patient stable and ready for discharge

## 2018-01-06 LAB — AEROBIC/ANAEROBIC CULTURE (SURGICAL/DEEP WOUND)

## 2018-01-06 LAB — AEROBIC/ANAEROBIC CULTURE W GRAM STAIN (SURGICAL/DEEP WOUND)

## 2018-01-06 NOTE — Discharge Summary (Signed)
Patient ID: Geoffrey West MRN: [id number] DOB/AGE: 61-20-1957 61 y.o.  Admit date: 01/01/2018 Discharge date: 01/03/2018  Primary Diagnosis: Loose left reverse total shoulder  Admission Diagnoses:  Past Medical History:  Diagnosis Date  . Anxiety   . Arthritis   . GERD (gastroesophageal reflux disease)   . Gout 12/25/2016  . Hypertension   . Hypertension 12/25/2016  . Low testosterone 12/25/2016  . 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   Weight as of this encounter: 97.4 kg (214 lb 11.7 oz).  Procedure:  Procedure(s) (LRB): Left reverse shouler explant with antibiotic spacer placement (Left)   Consults: Infectious disease  HPI: Geoffrey West Is a right handed 62 year old male with history of left reverse shoulder arthroplasty for an infected anatomic shoulder.  He is now about 13 months out from that surgery and developed pain after swimming 1 day in the left shoulder.  That was found to be loose on CT scan.  He presented to the hospital for explant of the reverse shoulder arthroplasty and placement of an antibiotic spacer. Laboratory Data: Admission on 01/01/2018, Discharged on 01/03/2018  Component Date Value Ref Range Status  . Specimen Description 01/01/2018    Final                   Value:SYNOVIAL LEFT SHOULDER Performed at [facility], [place] [address], [place], [place] [address]   . Special Requests 01/01/2018    Final                   Value:NONE Performed at [facility], [place] [address], [place], [place] [address]   . Gram Stain 01/01/2018    Final                   Value:RARE WBC PRESENT,BOTH PMN AND MONONUCLEAR NO ORGANISMS SEEN   . Culture 01/01/2018    Final   Value:CULTURE REINCUBATED FOR BETTER GROWTH CRITICAL VALUE NOTED.  VALUE IS CONSISTENT WITH PREVIOUSLY REPORTED AND CALLED VALUE. REGARDING CULTURE GROWTH Performed at [facility], [place] [address], [place], [place] [address]   . Report Status 01/01/2018 PENDING   Incomplete  . Specimen Description 01/01/2018    Final                   Value:TISSUE LEFT SHOULDER Performed at [place] [address] [place], [place] [address]   . Special Requests 01/01/2018    Final                   Value:NONE Performed at [facility], [place] [address], [place], [place] [address]   . Gram Stain 01/01/2018    Final                   Value:FEW WBC PRESENT, PREDOMINANTLY MONONUCLEAR NO ORGANISMS SEEN   . Culture 01/01/2018    Final                   Value:CULTURE REINCUBATED FOR BETTER GROWTH Performed at [facility], [place] [address] [place], [place] [address]   . Report Status 01/01/2018 PENDING   Incomplete  . Specimen Description 01/01/2018  Final                   Value:TISSUE LEFT SHOULDER Performed at [place] [address], [place], [place] [address]   . Special Requests 01/01/2018    Final                   Value:NONE Performed at [facility], [place] [address], [place], [place] [address]   . Gram Stain 01/01/2018    Final                   Value:MODERATE WBC PRESENT, PREDOMINANTLY MONONUCLEAR NO ORGANISMS SEEN   . Culture 01/01/2018    Final                   Value:CULTURE REINCUBATED FOR BETTER GROWTH CRITICAL VALUE NOTED.  VALUE IS CONSISTENT WITH PREVIOUSLY REPORTED AND CALLED VALUE. REGARDING CULTURE GROWTH  Performed at [facility], [place] [address] [place], [place] [address]   . Report Status 01/01/2018 PENDING   Incomplete  . Specimen Description 01/01/2018    Final                   Value:SYNOVIAL LEFT SHOULDER Performed at [facility], [place] [address] [place], [place] [address]   . Special Requests 01/01/2018    Final                   Value:NONE Performed at [facility], [place] [address] [place], [place] [address]   . Gram Stain 01/01/2018    Final                   Value:MODERATE WBC PRESENT, PREDOMINANTLY MONONUCLEAR NO ORGANISMS SEEN   . Culture 01/01/2018    Final                   Value:RARE PROPIONIBACTERIUM ACNES Standardized susceptibility testing for this organism is not available. CRITICAL RESULT CALLED TO, READ BACK BY AND VERIFIED WITH: DR. Megan Salon, AT 1626 01/05/18 BY D. VANHOOK REGARDING CULTURE GROWTH NO ANAEROBES ISOLATED Performed at [facility], [place] [address], [place], [place] [address]   . Report Status 01/01/2018 01/06/2018 FINAL   Final  . Sed Rate 01/02/2018 2  0 - 16 mm/hr Final   Performed at [facility], [place] [address], [place], [place] [address]  . CRP 01/02/2018 1.3* <1.0 mg/dL Final   Performed at [place] [address], [place], [place] [address]  Hospital Outpatient Visit on 12/31/2017  Component Date Value Ref Range Status  . MRSA, PCR 12/31/2017 NEGATIVE  NEGATIVE Final  . Staphylococcus aureus 12/31/2017 NEGATIVE  NEGATIVE Final   Comment: (NOTE) The Xpert SA Assay (FDA approved for NASAL specimens in patients 39 years of age and older), is one component of a comprehensive surveillance program. It is not intended to diagnose infection nor to guide or monitor treatment. Performed at [facility], [place] [address] [place], [place] [address]   . WBC 12/31/2017 7.1  4.0 - 10.5 K/uL Final  . RBC 12/31/2017 5.82* 4.22 - 5.81 MIL/uL Final  . Hemoglobin 12/31/2017 18.7* 13.0 - 17.0 g/dL Final  . HCT 12/31/2017 55.1* 39.0 - 52.0 % Final  . MCV 12/31/2017 94.7  78.0 - 100.0 fL Final  . MCH 12/31/2017 32.1  26.0 - 34.0 pg Final  . MCHC 12/31/2017 33.9  30.0 - 36.0 g/dL Final  . RDW 12/31/2017 13.0  11.5 - 15.5 % Final  . Platelets  12/31/2017 165  150 - 400 K/uL Final   Performed at [facility], [place] [address] [place], [place] [address]  . Sodium 12/31/2017 141  135 - 145 mmol/L Final  . Potassium 12/31/2017 3.6  3.5 - 5.1 mmol/L Final  . Chloride 12/31/2017 105  98 - 111 mmol/L Final   Please note change in reference range.  . CO2 12/31/2017 27  22 - 32 mmol/L Final  . Glucose, Bld 12/31/2017 114* 70 - 99 mg/dL Final   Please note change in reference range.  . BUN 12/31/2017 18  8 - 23 mg/dL Final   Please note change in reference range.  . Creatinine, Ser 12/31/2017 1.36* 0.61 - 1.24 mg/dL Final  . Calcium 12/31/2017 9.9  8.9 - 10.3 mg/dL Final  . GFR calc non Af Amer 12/31/2017 55* >60 mL/min Final  . GFR calc Af Amer 12/31/2017 >60  >60 mL/min Final   Comment: (NOTE) The eGFR has been calculated using the CKD EPI equation. This calculation has not been validated in all clinical situations. eGFR's persistently <60 mL/min signify possible Chronic Kidney Disease.   Georgiann Hahn gap 12/31/2017 9  5 - 15 Final   Performed at [facility], [place] [address], [place], [place] [address]     X-Rays:Ct Shoulder Left Wo Contrast  Result Date: 12/17/2017 CLINICAL DATA:  Left shoulder pain for the past 3 weeks after swimming injury. Prior reverse total shoulder arthroplasty. EXAM: CT OF THE UPPER LEFT EXTREMITY WITHOUT CONTRAST TECHNIQUE: Multidetector CT imaging of the upper left extremity was performed according to the standard protocol. COMPARISON:  Left shoulder x-rays dated December 08, 2016. CT left shoulder dated Nov 03, 2016. FINDINGS: Bones/Joint/Cartilage Prior reverse total shoulder arthroplasty. There are fractures of the medial most and inferior most glenoid component screws (series 4, image 34 and 41). There is lucency between the inferior portion of the glenoid component in the adjacent glenoid bone (series 12, image 86). The humeral component is unremarkable. Small amount of heterotopic ossification along  the posterior inferior glenohumeral joint. No acute fracture or dislocation. Large amount of fluid in the subacromial/subdeltoid bursa. Ligaments Suboptimally assessed by CT. Muscles and Tendons Mild to moderate subscapularis muscle atrophy, unchanged. Soft tissues Unremarkable.  The visualized left lung is clear. IMPRESSION: 1. Prior reverse total shoulder arthroplasty with fractures of two of the glenoid component screws and lucency between the inferior portion of the glenoid component and the adjacent glenoid bone, consistent with loosening. 2. Large amount of fluid within the subacromial/subdeltoid bursa, likely reflecting bursitis. Electronically Signed   By: Titus Dubin M.D.   On: 12/17/2017 11:40   Ct Shoulder Right Wo Contrast  Result Date: 01/02/2018 CLINICAL DATA:  Chronic progressive right shoulder pain. Previous right shoulder replacement. EXAM: CT OF THE UPPER RIGHT EXTREMITY WITHOUT CONTRAST TECHNIQUE: Multidetector CT imaging of the upper right extremity was performed according to the standard protocol. COMPARISON:  None. FINDINGS: Bones/Joint/Cartilage The humeral component of the total shoulder prosthesis appears in excellent position with no evidence of loosening. The humerus is superiorly subluxed with respect to the glenoid. The glenoid component of the prosthesis is not radiodense and cannot not be assessed. There are small areas of erosion of the glenoid. 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 Muscles and Tendons No atrophy of the muscles of the  rotator cuff. Soft tissues No appreciable joint effusions or other significant abnormalities. IMPRESSION: 1. Ununited os acromiale. Minimal degenerative changes of the [facility] joint. 2. Slight superior subluxation of the proximal humerus with respect to the glenoid. 3. The glenoid component of the prosthesis cannot be assessed since it is not radiodense. There are some small areas of erosion of the glenoid.  4. Humeral component of the prosthesis demonstrates no evidence of loosening. Electronically Signed   By: Lorriane Shire M.D.   On: 01/02/2018 11:11   Dg Shoulder Left Port  Result Date: 01/01/2018 CLINICAL DATA:  Removal of left shoulder replacement hardware with antibiotic spacer placement. EXAM: LEFT SHOULDER - 1 VIEW COMPARISON:  None. FINDINGS: The left shoulder hardware has been removed. An antibiotic spacer has been placed. A cerclage wire seen. No dislocation based on the transscapular Y-view. IMPRESSION: Postsurgical changes as above. Electronically Signed   By: Dorise Bullion III M.D   On: 01/01/2018 20:20    EKG: Orders placed or performed during the hospital encounter of 12/31/17  . EKG 12-Lead  . EKG 12-Lead     Hospital Course: Geoffrey West is a 62 y.o. who was admitted to Hospital. They were brought to the operating room on 01/01/2018 and underwent Procedure(s): Left reverse shouler explant with antibiotic spacer placement.  Patient tolerated the procedure well and was later transferred to the recovery room and then to the orthopaedic floor for postoperative care.  They were given PO and IV analgesics for pain control following their surgery.  They were given 24 hours of postoperative antibiotics of  Anti-infectives (From admission, onward)   Start     Dose/Rate Route Frequency Ordered Stop   01/03/18 1430  cephALEXin (KEFLEX) capsule 500 mg  Status:  Discontinued     500 mg Oral Every 6 hours 01/03/18 1424 01/03/18 1852   01/03/18 0000  cephALEXin (KEFLEX) 500 MG capsule     500 mg Oral Every 6 hours 01/03/18 1434 02/02/18 2359   01/02/18 1600  cefTRIAXone (ROCEPHIN) 2 g in sodium chloride 0.9 % 100 mL IVPB  Status:  Discontinued     2 g 200 mL/hr over 30 Minutes Intravenous Every 24 hours 01/02/18 1418 01/03/18 1424   01/02/18 0400  vancomycin (VANCOCIN) IVPB 1000 mg/200 mL premix     1,000 mg 200 mL/hr over 60 Minutes Intravenous Every 12 hours 01/01/18 1819 01/02/18  0416   01/01/18 1653  vancomycin (VANCOCIN) powder  Status:  Discontinued       As needed 01/01/18 1653 01/01/18 1749   01/01/18 1130  vancomycin (VANCOCIN) IVPB 1000 mg/200 mL premix     1,000 mg 200 mL/hr over 60 Minutes Intravenous To Surgery 01/01/18 1120 01/01/18 1731     and started on DVT prophylaxis in the form of Aspirin.   PT and OT were ordered for total joint protocol.  Continued to work with therapy into day two.  Dressing was changed on day two and the incision was Clean dry and intact.  By day three, the patient had progressed with therapy and meeting their goals, And had adequate pain control.  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  Incision was healing well.  Patient was seen in rounds and was ready to go home.   Diet: Regular diet Activity:NWB Follow-up:in 2 weeks Disposition - Home Discharged Condition: good   Discharge Instructions  Call MD / Call 911   Complete by:  As directed    If you experience chest pain or shortness of breath, CALL 911 and be transported to the hospital emergency room.  If you develope a fever above 101 F, pus (white drainage) or increased drainage or redness at the wound, or calf pain, call your surgeon's office.   Constipation Prevention   Complete by:  As directed    Drink plenty of fluids.  Prune juice may be helpful.  You may use a stool softener, such as Colace (over the counter) 100 mg twice a day.  Use MiraLax (over the counter) for constipation as needed.   Diet - low sodium heart healthy   Complete by:  As directed    Increase activity slowly as tolerated   Complete by:  As directed      Allergies as of 01/03/2018      Reactions   Fentanyl Shortness Of Breath   Reaction to [facility] ONLY > ? DOSE REGULATION ?   Oxycodone Other (See Comments)   Pt states he goes through hot and  cold flashes withdrawal like symptoms      Medication List    TAKE these medications   allopurinol 300 MG tablet Commonly known as:  ZYLOPRIM Take 300 mg by mouth daily.   amLODipine 10 MG tablet Commonly known as:  NORVASC Take 10 mg by mouth at bedtime.   amoxicillin-clavulanate 875-125 MG tablet Commonly known as:  AUGMENTIN TAKE 1 TABLET BY MOUTH TWICE A DAY   celecoxib 200 MG capsule Commonly known as:  CELEBREX Take 200 mg by mouth 2 (two) times daily.   cephALEXin 500 MG capsule Commonly known as:  KEFLEX Take 1 capsule (500 mg total) by mouth every 6 (six) hours.   clonazePAM 0.5 MG tablet Commonly known as:  KLONOPIN Take 0.5 mg by mouth at bedtime.   fluticasone 50 MCG/ACT nasal spray Commonly known as:  FLONASE Place 2 sprays into both nostrils daily. For allergies.   multivitamin with minerals Tabs tablet Take 1 tablet by mouth daily.   propranolol ER 120 MG 24 hr capsule Commonly known as:  INDERAL LA Take 1 capsule (120 mg total) by mouth daily.   testosterone cypionate 200 MG/ML injection Commonly known as:  DEPOTESTOSTERONE CYPIONATE Inject 100 mg into the muscle every Wednesday.   traMADol 50 MG tablet Commonly known as:  ULTRAM Take 1 tablet (50 mg total) by mouth every 6 (six) hours as needed for moderate pain.   Ubiquinol 100 MG Caps Take 100 mg by mouth daily.      Follow-up Information    [name], MD In 2 weeks.   Specialty:  Orthopedic Surgery Why:  For wound re-check, For suture removal Contact information: [address] [place] [address] [phone]           Signed: Geralynn Rile, MD Orthopaedic Surgery 01/06/2018, 3:54 PM

## 2018-01-07 DIAGNOSIS — F419 Anxiety disorder, unspecified: Secondary | ICD-10-CM | POA: Diagnosis not present

## 2018-01-07 DIAGNOSIS — F410 Panic disorder [episodic paroxysmal anxiety] without agoraphobia: Secondary | ICD-10-CM | POA: Diagnosis not present

## 2018-01-07 DIAGNOSIS — Z96612 Presence of left artificial shoulder joint: Secondary | ICD-10-CM | POA: Diagnosis not present

## 2018-01-09 ENCOUNTER — Other Ambulatory Visit: Payer: Self-pay | Admitting: *Deleted

## 2018-01-09 DIAGNOSIS — E291 Testicular hypofunction: Secondary | ICD-10-CM | POA: Diagnosis not present

## 2018-01-09 NOTE — Patient Outreach (Signed)
[facility]) Care Management  01/09/2018  Lessie Manigo [facility] [birth year] [id number]  Referral via RED Alert-EMMI-General Discharge: Reason-Lost interest in doing things-yes, sad/anxious-yes-day#4, 01/08/2018  Per Hx: Admission 7/9-7/04/2018-Dx-loose reverse shoulder -prosthetic shoulder infection Procedure-Explant of reverse shoulder arthroplasty & placement of antibiotic spacer.  Telephone #1 to patient who was advised of reason for call.  HIPPA verification received.   Patient states he was feeling anxious yesterday but feels much better today. States he has trouble with being anxious when coming off of pain medications that he had in hospital.  States he called his primary provider & had appointment yesterday.  States only taking non-narcotic pain medication alone with anxiety medication now.  States he feels very comfortable calling his MD when having anxiety because he wants help to relieve problems when he is experiencing it.   Patient states he lives alone but has support from family members. States he takes medications as prescribed by his MD. Voices that he drives himself to MD appointments. States he has follow up with ortho in 2 weeks.    Voices he has no further concerns at this time. EMMI call has been addressed.  Plan: Case closure.   Sherrin Daisy, RN BSN [place] Management Coordinator [facility] Care Management  [phone]

## 2018-01-16 DIAGNOSIS — E291 Testicular hypofunction: Secondary | ICD-10-CM | POA: Diagnosis not present

## 2018-01-22 DIAGNOSIS — F418 Other specified anxiety disorders: Secondary | ICD-10-CM | POA: Diagnosis not present

## 2018-01-22 DIAGNOSIS — I10 Essential (primary) hypertension: Secondary | ICD-10-CM | POA: Diagnosis not present

## 2018-01-23 DIAGNOSIS — E291 Testicular hypofunction: Secondary | ICD-10-CM | POA: Diagnosis not present

## 2018-01-28 ENCOUNTER — Other Ambulatory Visit: Payer: Self-pay | Admitting: Family

## 2018-01-28 MED ORDER — CEPHALEXIN 500 MG PO CAPS: 500.0000 mg | ORAL_CAPSULE | Freq: Four times a day (QID) | ORAL | 0 refills | Status: DC

## 2018-01-28 NOTE — Progress Notes (Signed)
Received notes of patient walking in with question of continuing antibiotics with prosthetic joint infection. Per notes it appears he has completed about 4 weeks. Will refill Keflex until next appointment on 02/07/18.

## 2018-01-29 DIAGNOSIS — M19012 Primary osteoarthritis, left shoulder: Secondary | ICD-10-CM | POA: Diagnosis not present

## 2018-01-30 DIAGNOSIS — E291 Testicular hypofunction: Secondary | ICD-10-CM | POA: Diagnosis not present

## 2018-01-31 DIAGNOSIS — M19012 Primary osteoarthritis, left shoulder: Secondary | ICD-10-CM | POA: Diagnosis not present

## 2018-02-05 DIAGNOSIS — M19012 Primary osteoarthritis, left shoulder: Secondary | ICD-10-CM | POA: Diagnosis not present

## 2018-02-06 DIAGNOSIS — E291 Testicular hypofunction: Secondary | ICD-10-CM | POA: Diagnosis not present

## 2018-02-07 ENCOUNTER — Ambulatory Visit (INDEPENDENT_AMBULATORY_CARE_PROVIDER_SITE_OTHER): Payer: PPO | Admitting: Family

## 2018-02-07 ENCOUNTER — Encounter: Payer: Self-pay | Admitting: Family

## 2018-02-07 VITALS — BP 134/91 | HR 97 | Ht 71.0 in | Wt 212.0 lb

## 2018-02-07 DIAGNOSIS — Z96619 Presence of unspecified artificial shoulder joint: Secondary | ICD-10-CM

## 2018-02-07 DIAGNOSIS — T8459XD Infection and inflammatory reaction due to other internal joint prosthesis, subsequent encounter: Secondary | ICD-10-CM

## 2018-02-07 MED ORDER — CEPHALEXIN 500 MG PO CAPS: 500.0000 mg | ORAL_CAPSULE | Freq: Four times a day (QID) | ORAL | 3 refills | Status: AC

## 2018-02-07 NOTE — Patient Instructions (Signed)
Nice to see you.  Continue to take your Keflex.  Follow up with Dr. Johnnye Sima or Dr. Tommy Medal following your next surgery ~1-2 weeks afterwards.

## 2018-02-07 NOTE — Progress Notes (Signed)
Subjective:    Patient ID: Geoffrey West, male    DOB: 01-06-1956, 62 y.o.   MRN: [id number]  Chief Complaint  Patient presents with  . Hospitalization Follow-up    infection of prosthetic sholder joint    HPI:  Geoffrey West is a 62 y.o. male who presents today for follow up office visit following hospitalization.   Geoffrey West was recently admitted to the hospital for explant of the reverse shoulder arthoplasty and placement of antibiotic spacer. His cultures were positive for propionibacterium acnes in all cultures that were obtained. He was discharged on Keflex with follow up with orthopedics planned for about 6 weeks for arthroplasty replacement.   Geoffrey West has been taking his Keflex as prescribed with no adverse side effects or missed doses. He has had no pain, edema, or fevers. Orthopedics is planning for surgical intervention and replacement of shoulder in the next 3-4 weeks.    Allergies  Allergen Reactions  . Fentanyl Shortness Of Breath    Reaction to [facility] ONLY > ? DOSE REGULATION ?   [name] Oxycodone Other (See Comments)    Pt states he goes through hot and cold flashes withdrawal like symptoms      Outpatient Medications Prior to Visit  Medication Sig Dispense Refill  . allopurinol (ZYLOPRIM) 300 MG tablet Take 300 mg by mouth daily.    [name] amLODipine (NORVASC) 10 MG tablet Take 10 mg by mouth at bedtime.     . celecoxib (CELEBREX) 200 MG capsule Take 200 mg by mouth 2 (two) times daily.  3  . clonazePAM (KLONOPIN) 0.5 MG tablet Take 0.5 mg by mouth at bedtime.    . fluticasone (FLONASE) 50 MCG/ACT nasal spray Place 2 sprays into both nostrils daily. For allergies.  3  . Multiple Vitamin (MULTIVITAMIN WITH MINERALS) TABS tablet Take 1 tablet by mouth daily.    . propranolol ER (INDERAL LA) 120 MG 24 hr capsule Take 1 capsule (120 mg total) by mouth daily. 30 capsule 2  . testosterone cypionate (DEPOTESTOSTERONE CYPIONATE) 200 MG/ML injection Inject 100 mg into  the muscle every Wednesday.  5  . traMADol (ULTRAM) 50 MG tablet Take 1 tablet (50 mg total) by mouth every 6 (six) hours as needed for moderate pain. 50 tablet 0  . Ubiquinol 100 MG CAPS Take 100 mg by mouth daily.    . cephALEXin (KEFLEX) 500 MG capsule Take 1 capsule (500 mg total) by mouth every 6 (six) hours for 14 days. 56 capsule 0  . amoxicillin-clavulanate (AUGMENTIN) 875-125 MG tablet TAKE 1 TABLET BY MOUTH TWICE A DAY (Patient not taking: Reported on 02/07/2018) 60 tablet 0   No facility-administered medications prior to visit.      Past Medical History:  Diagnosis Date  . Anxiety   . Arthritis   . GERD (gastroesophageal reflux disease)   . Gout 12/25/2016  . Hypertension   . Hypertension 12/25/2016  . Low testosterone 12/25/2016  . 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 BACK SURGERY    . COLONOSCOPY    . IR FLUORO GUIDE CV LINE RIGHT  12/26/2016  . LASIK    . REPLACEMENT TOTAL KNEE     both knees replacments                 (MULTIPLE SURGERIES BILATERAL KNEES PRIOR TO REPLACEMET)  . REVERSE SHOULDER ARTHROPLASTY Left 12/08/2016   Procedure: Revision left total  shoulder to reverse total shoulder arthroplasty;  Surgeon: [name], MD;  Location: [facility];  Service: Orthopedics;  Laterality: Left;  . REVERSE SHOULDER ARTHROPLASTY Left 01/01/2018   Procedure: Left reverse shouler explant with antibiotic spacer placement;  Surgeon: [name], MD;  Location: [facility];  Service: Orthopedics;  Laterality: Left;  2.5 hrs  . SHOULDER SURGERY    . SHOULDER SURGERY     RIGHT ARTHROPLASTY   (MULTIPLE SURGERIES)      Family History  Problem Relation Age of Onset  . Arthritis Mother   . Arthritis Sister   . Alzheimer's disease Brother   . Arthritis Brother       Social History   Socioeconomic History  . Marital status: Single    Spouse name: Not on file  . Number of children: Not on file  .  Years of education: Not on file  . Highest education level: Not on file  Occupational History  . Not on file  Social Needs  . Financial resource strain: Not on file  . Food insecurity:    Worry: Not on file    Inability: Not on file  . Transportation needs:    Medical: Not on file    Non-medical: Not on file  Tobacco Use  . Smoking status: Never Smoker  . Smokeless tobacco: Never Used  Substance and Sexual Activity  . Alcohol use: No  . Drug use: No  . Sexual activity: Not on file  Lifestyle  . Physical activity:    Days per week: Not on file    Minutes per session: Not on file  . Stress: Not on file  Relationships  . Social connections:    Talks on phone: Not on file    Gets together: Not on file    Attends religious service: Not on file    Active member of club or organization: Not on file    Attends meetings of clubs or organizations: Not on file    Relationship status: Not on file  . Intimate partner violence:    Fear of current or ex partner: Not on file    Emotionally abused: Not on file    Physically abused: Not on file    Forced sexual activity: Not on file  Other Topics Concern  . Not on file  Social History Narrative  . Not on file      Review of Systems  Constitutional: Negative for chills and fever.  Respiratory: Negative for chest tightness and shortness of breath.   Cardiovascular: Negative for chest pain and palpitations.  Musculoskeletal: Negative for arthralgias, joint swelling and myalgias.  Neurological: Negative for weakness.       Objective:    BP (!) 134/91   Pulse 97   Ht 5\' 11"  (1.803 m)   Wt 212 lb (96.2 kg)   BMI 29.57 kg/m  Nursing note and vital signs reviewed.  Physical Exam  Constitutional: He is oriented to person, place, and time. He appears well-developed and well-nourished. No distress.  Cardiovascular: Normal rate, regular rhythm, normal heart sounds and intact distal pulses.  Pulmonary/Chest: Effort normal and  breath sounds normal.  Musculoskeletal:  Left shoulder with no obvious deformity, discoloration, or edema. Surgical scars appear healing with no evidence of infection. Distal pulses and sensation are intact and appropriate.  Neurological: He is alert and oriented to person, place, and time.  Skin: Skin is warm and dry.  Psychiatric: He has a normal mood and affect. His behavior  is normal. Judgment and thought content normal.        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 Orthopedics working to schedule reimplantation of shoulder in the next 3-4 weeks. Plan is to continue Keflex indefinitely likely requiring prolonged course of 6 months to a year following surgical intervention. Plan for follow up with Dr. Tommy Medal or Dr. Johnnye Sima following surgical procedure.       Relevant Medications   cephALEXin (KEFLEX) 500 MG capsule       I have changed Geoffrey H. Godeaux "Tommy"'s cephALEXin. I am also having him maintain his allopurinol, multivitamin with minerals, amLODipine, celecoxib, fluticasone, testosterone cypionate, Ubiquinol, propranolol ER, amoxicillin-clavulanate, clonazePAM, and traMADol.   Meds ordered this encounter  Medications  . cephALEXin (KEFLEX) 500 MG capsule    Sig: Take 1 capsule (500 mg total) by mouth every 6 (six) hours.    Dispense:  120 capsule    Refill:  3    Order Specific Question:   Supervising Provider    Answer:   Carlyle Basques [[id number]]     Follow-up: Return in about 6 weeks (around 03/21/2018), or if symptoms worsen or fail to improve.    Terri Piedra, MSN, FNP-C Nurse Practitioner [facility] Office phone: [phone] Pager: [place] number: [phone]

## 2018-02-08 ENCOUNTER — Encounter: Payer: Self-pay | Admitting: Family

## 2018-02-08 DIAGNOSIS — M19012 Primary osteoarthritis, left shoulder: Secondary | ICD-10-CM | POA: Diagnosis not present

## 2018-02-08 NOTE — Assessment & Plan Note (Signed)
Mr. Ohms appears stable with current dose of cephalexin with no adverse side effects. Orthopedics working to schedule reimplantation of shoulder in the next 3-4 weeks. Plan is to continue Keflex indefinitely likely requiring prolonged course of 6 months to a year following surgical intervention. Plan for follow up with Dr. Tommy Medal or Dr. Johnnye Sima following surgical procedure.

## 2018-02-12 DIAGNOSIS — M19012 Primary osteoarthritis, left shoulder: Secondary | ICD-10-CM | POA: Diagnosis not present

## 2018-02-12 DIAGNOSIS — Z96612 Presence of left artificial shoulder joint: Secondary | ICD-10-CM | POA: Diagnosis not present

## 2018-02-12 DIAGNOSIS — M25511 Pain in right shoulder: Secondary | ICD-10-CM | POA: Diagnosis not present

## 2018-02-12 DIAGNOSIS — Z4789 Encounter for other orthopedic aftercare: Secondary | ICD-10-CM | POA: Diagnosis not present

## 2018-02-26 DIAGNOSIS — Z79890 Hormone replacement therapy (postmenopausal): Secondary | ICD-10-CM | POA: Diagnosis not present

## 2018-02-26 DIAGNOSIS — I10 Essential (primary) hypertension: Secondary | ICD-10-CM | POA: Diagnosis not present

## 2018-02-26 DIAGNOSIS — E291 Testicular hypofunction: Secondary | ICD-10-CM | POA: Diagnosis not present

## 2018-02-26 DIAGNOSIS — E782 Mixed hyperlipidemia: Secondary | ICD-10-CM | POA: Diagnosis not present

## 2018-02-26 DIAGNOSIS — F419 Anxiety disorder, unspecified: Secondary | ICD-10-CM | POA: Diagnosis not present

## 2018-02-27 DIAGNOSIS — E291 Testicular hypofunction: Secondary | ICD-10-CM | POA: Diagnosis not present

## 2018-02-27 NOTE — Pre-Procedure Instructions (Signed)
Edna Grover [facility]  02/27/2018      [id number] - [place], [place] [address] Phone: [phone] Fax: [phone]    Your procedure is scheduled on Wednesday September 11th.  Report to [facility] at 1030 A.M.  Call this number if you have problems the morning of surgery:  [phone]   Remember:  Do not eat or drink after midnight.    Take these medicines the morning of surgery with A SIP OF WATER   Allopurinol  Norvasc  Klonopin (if needed)  Flonase (if needed)  Paxil  Zantac (if needed)  7 days prior to surgery STOP taking any Aspirin(unless otherwise instructed by your surgeon), Aleve, Naproxen, Ibuprofen, Motrin, Advil, Goody's, BC's, all herbal medications, fish oil, and all vitamins     Do not wear jewelry  Do not wear lotions, powders, or colognes, or deodorant.  Do not shave 48 hours prior to surgery.  Men may shave face and neck.  Do not bring valuables to the hospital.  [facility] is not responsible for any belongings or valuables.  Contacts, dentures or bridgework may not be worn into surgery.  Leave your suitcase in the car.  After surgery it may be brought to your room.  For patients admitted to the hospital, discharge time will be determined by your treatment team.  Patients discharged the day of surgery will not be allowed to drive home.    [facility]- Preparing For Surgery  Before surgery, you can play an important role. Because skin is not sterile, your skin needs to be as free of germs as possible. You can reduce the number of germs on your skin by washing with CHG (chlorahexidine gluconate) Soap before surgery.  CHG is an antiseptic cleaner which kills germs and bonds with the skin to continue killing germs even after washing.    Oral Hygiene is also important to reduce your risk of infection.  Remember - BRUSH YOUR TEETH THE MORNING OF SURGERY WITH YOUR REGULAR  TOOTHPASTE  Please do not use if you have an allergy to CHG or antibacterial soaps. If your skin becomes reddened/irritated stop using the CHG.  Do not shave (including legs and underarms) for at least 48 hours prior to first CHG shower. It is OK to shave your face.  Please follow these instructions carefully.   1. Shower the NIGHT BEFORE SURGERY and the MORNING OF SURGERY with CHG.   2. If you chose to wash your hair, wash your hair first as usual with your normal shampoo.  3. After you shampoo, rinse your hair and body thoroughly to remove the shampoo.  4. Use CHG as you would any other liquid soap. You can apply CHG directly to the skin and wash gently with a scrungie or a clean washcloth.   5. Apply the CHG Soap to your body ONLY FROM THE NECK DOWN.  Do not use on open wounds or open sores. Avoid contact with your eyes, ears, mouth and genitals (private parts). Wash Face and genitals (private parts)  with your normal soap.  6. Wash thoroughly, paying special attention to the area where your surgery will be performed.  7. Thoroughly rinse your body with warm water from the neck down.  8. DO NOT shower/wash with your normal soap after using and rinsing off the CHG Soap.  9. Pat yourself dry with a CLEAN TOWEL.  10. Wear CLEAN PAJAMAS to bed the night  before surgery, wear comfortable clothes the morning of surgery  11. Place CLEAN SHEETS on your bed the night of your first shower and DO NOT SLEEP WITH PETS.    Day of Surgery:  Do not apply any deodorants/lotions.  Please wear clean clothes to the hospital/surgery center.   Remember to brush your teeth WITH YOUR REGULAR TOOTHPASTE.    Please read over the following fact sheets that you were given.

## 2018-02-28 ENCOUNTER — Encounter (HOSPITAL_COMMUNITY)
Admission: RE | Admit: 2018-02-28 | Discharge: 2018-02-28 | Disposition: A | Discharge status: Home / Self Care | Payer: PPO | Source: Ambulatory Visit | Attending: Orthopedic Surgery | Admitting: Orthopedic Surgery

## 2018-02-28 ENCOUNTER — Encounter (HOSPITAL_COMMUNITY): Payer: Self-pay

## 2018-02-28 DIAGNOSIS — Z01812 Encounter for preprocedural laboratory examination: Secondary | ICD-10-CM | POA: Insufficient documentation

## 2018-02-28 LAB — CBC
HCT: 54.4 % — ABNORMAL HIGH (ref 39.0–52.0)
HEMOGLOBIN: 18.4 g/dL — AB (ref 13.0–17.0)
MCH: 32.2 pg (ref 26.0–34.0)
MCHC: 33.8 g/dL (ref 30.0–36.0)
MCV: 95.3 fL (ref 78.0–100.0)
Platelets: 163 10*3/uL (ref 150–400)
RBC: 5.71 MIL/uL (ref 4.22–5.81)
RDW: 12.8 % (ref 11.5–15.5)
WBC: 5.4 10*3/uL (ref 4.0–10.5)

## 2018-02-28 LAB — BASIC METABOLIC PANEL
ANION GAP: 11 (ref 5–15)
BUN: 20 mg/dL (ref 8–23)
CALCIUM: 10.2 mg/dL (ref 8.9–10.3)
CO2: 26 mmol/L (ref 22–32)
CREATININE: 1.19 mg/dL (ref 0.61–1.24)
Chloride: 104 mmol/L (ref 98–111)
GFR calc non Af Amer: >60 mL/min (ref >60)
Glucose, Bld: 119 mg/dL — ABNORMAL HIGH (ref 70–99)
Potassium: 3.4 mmol/L — ABNORMAL LOW (ref 3.5–5.1)
SODIUM: 141 mmol/L (ref 135–145)

## 2018-03-05 MED ORDER — TRANEXAMIC ACID 1000 MG/10ML IV SOLN
1000.0000 mg | INTRAVENOUS | Status: AC
  Administered 2018-03-06: 1000 mg via INTRAVENOUS
  Filled 2018-03-05: qty 1000

## 2018-03-06 ENCOUNTER — Inpatient Hospital Stay (HOSPITAL_COMMUNITY): Payer: PPO

## 2018-03-06 ENCOUNTER — Inpatient Hospital Stay (HOSPITAL_COMMUNITY)
Admission: RE | Admit: 2018-03-06 | Discharge: 2018-03-08 | DRG: 494 | Disposition: A | Discharge status: Home / Self Care | Payer: PPO | Attending: Orthopedic Surgery | Admitting: Orthopedic Surgery

## 2018-03-06 ENCOUNTER — Inpatient Hospital Stay (HOSPITAL_COMMUNITY): Payer: PPO | Admitting: Anesthesiology

## 2018-03-06 ENCOUNTER — Encounter (HOSPITAL_COMMUNITY): Payer: Self-pay | Admitting: Urology

## 2018-03-06 ENCOUNTER — Encounter (HOSPITAL_COMMUNITY)
Admission: RE | Disposition: A | Discharge status: Home / Self Care | Payer: Self-pay | Source: Home / Self Care | Attending: Orthopedic Surgery

## 2018-03-06 ENCOUNTER — Other Ambulatory Visit: Payer: Self-pay

## 2018-03-06 DIAGNOSIS — G8918 Other acute postprocedural pain: Secondary | ICD-10-CM | POA: Diagnosis not present

## 2018-03-06 DIAGNOSIS — Z96653 Presence of artificial knee joint, bilateral: Secondary | ICD-10-CM | POA: Diagnosis not present

## 2018-03-06 DIAGNOSIS — Z8261 Family history of arthritis: Secondary | ICD-10-CM

## 2018-03-06 DIAGNOSIS — T84038A Mechanical loosening of other internal prosthetic joint, initial encounter: Secondary | ICD-10-CM | POA: Diagnosis present

## 2018-03-06 DIAGNOSIS — T8459XD Infection and inflammatory reaction due to other internal joint prosthesis, subsequent encounter: Secondary | ICD-10-CM | POA: Diagnosis present

## 2018-03-06 DIAGNOSIS — Z79899 Other long term (current) drug therapy: Secondary | ICD-10-CM | POA: Diagnosis not present

## 2018-03-06 DIAGNOSIS — M1990 Unspecified osteoarthritis, unspecified site: Secondary | ICD-10-CM | POA: Diagnosis present

## 2018-03-06 DIAGNOSIS — Z96619 Presence of unspecified artificial shoulder joint: Secondary | ICD-10-CM

## 2018-03-06 DIAGNOSIS — F419 Anxiety disorder, unspecified: Secondary | ICD-10-CM | POA: Diagnosis not present

## 2018-03-06 DIAGNOSIS — I10 Essential (primary) hypertension: Secondary | ICD-10-CM | POA: Diagnosis not present

## 2018-03-06 DIAGNOSIS — T8459XA Infection and inflammatory reaction due to other internal joint prosthesis, initial encounter: Principal | ICD-10-CM | POA: Diagnosis present

## 2018-03-06 DIAGNOSIS — Z96612 Presence of left artificial shoulder joint: Secondary | ICD-10-CM

## 2018-03-06 DIAGNOSIS — K219 Gastro-esophageal reflux disease without esophagitis: Secondary | ICD-10-CM | POA: Diagnosis present

## 2018-03-06 DIAGNOSIS — Z885 Allergy status to narcotic agent status: Secondary | ICD-10-CM | POA: Diagnosis not present

## 2018-03-06 DIAGNOSIS — F4321 Adjustment disorder with depressed mood: Secondary | ICD-10-CM | POA: Diagnosis present

## 2018-03-06 DIAGNOSIS — Y831 Surgical operation with implant of artificial internal device as the cause of abnormal reaction of the patient, or of later complication, without mention of misadventure at the time of the procedure: Secondary | ICD-10-CM | POA: Diagnosis present

## 2018-03-06 DIAGNOSIS — M109 Gout, unspecified: Secondary | ICD-10-CM | POA: Diagnosis present

## 2018-03-06 DIAGNOSIS — Z7951 Long term (current) use of inhaled steroids: Secondary | ICD-10-CM | POA: Diagnosis not present

## 2018-03-06 DIAGNOSIS — Z89232 Acquired absence of left shoulder: Secondary | ICD-10-CM | POA: Diagnosis not present

## 2018-03-06 DIAGNOSIS — T8484XA Pain due to internal orthopedic prosthetic devices, implants and grafts, initial encounter: Secondary | ICD-10-CM | POA: Diagnosis not present

## 2018-03-06 HISTORY — DX: Migraine, unspecified, not intractable, without status migrainosus: G43.909

## 2018-03-06 HISTORY — PX: SHOULDER SURGERY: SHX246

## 2018-03-06 HISTORY — DX: Adjustment disorder with depressed mood: F43.21

## 2018-03-06 HISTORY — PX: EXCISIONAL TOTAL SHOULDER ARTHROPLASTY WITH ANTIBIOTIC SPACER: SHX6264

## 2018-03-06 SURGERY — REMOVAL, HARDWARE, SHOULDER, WITH IRRIGATION, DEBRIDEMENT, AND INSERTION OF ANTIBIOTIC BEADS OR ANTIBIOTIC SPACER
Anesthesia: Regional | Site: Shoulder | Laterality: Left

## 2018-03-06 MED ORDER — LIDOCAINE 2% (20 MG/ML) 5 ML SYRINGE
INTRAMUSCULAR | Status: DC | PRN
  Administered 2018-03-06: 100 mg via INTRAVENOUS

## 2018-03-06 MED ORDER — FLUTICASONE PROPIONATE 50 MCG/ACT NA SUSP
2.0000 | Freq: Every day | NASAL | Status: DC | PRN
  Filled 2018-03-06: qty 16

## 2018-03-06 MED ORDER — CEPHALEXIN 500 MG PO CAPS
500.0000 mg | ORAL_CAPSULE | Freq: Four times a day (QID) | ORAL | Status: DC
  Administered 2018-03-06 – 2018-03-08 (×6): 500 mg via ORAL
  Filled 2018-03-06 (×6): qty 1

## 2018-03-06 MED ORDER — CHLORHEXIDINE GLUCONATE 4 % EX LIQD: 60.0000 mL | Freq: Once | CUTANEOUS | Status: DC

## 2018-03-06 MED ORDER — EPHEDRINE 5 MG/ML INJ
INTRAVENOUS | Status: AC
  Filled 2018-03-06: qty 10

## 2018-03-06 MED ORDER — ONDANSETRON HCL 4 MG PO TABS: 4.0000 mg | ORAL_TABLET | Freq: Four times a day (QID) | ORAL | Status: DC | PRN

## 2018-03-06 MED ORDER — ASPIRIN EC 325 MG PO TBEC
325.0000 mg | DELAYED_RELEASE_TABLET | Freq: Every day | ORAL | Status: DC
  Administered 2018-03-07 – 2018-03-08 (×2): 325 mg via ORAL
  Filled 2018-03-06 (×2): qty 1

## 2018-03-06 MED ORDER — ALLOPURINOL 300 MG PO TABS
300.0000 mg | ORAL_TABLET | Freq: Every day | ORAL | Status: DC
  Administered 2018-03-06 – 2018-03-08 (×3): 300 mg via ORAL
  Filled 2018-03-06 (×3): qty 1

## 2018-03-06 MED ORDER — METOCLOPRAMIDE HCL 5 MG/ML IJ SOLN: 5.0000 mg | Freq: Three times a day (TID) | INTRAMUSCULAR | Status: DC | PRN

## 2018-03-06 MED ORDER — PROPOFOL 10 MG/ML IV BOLUS
INTRAVENOUS | Status: AC
  Filled 2018-03-06: qty 20

## 2018-03-06 MED ORDER — CELECOXIB 200 MG PO CAPS
200.0000 mg | ORAL_CAPSULE | Freq: Two times a day (BID) | ORAL | Status: DC
  Administered 2018-03-06 – 2018-03-08 (×4): 200 mg via ORAL
  Filled 2018-03-06 (×4): qty 1

## 2018-03-06 MED ORDER — PANTOPRAZOLE SODIUM 20 MG PO TBEC
20.0000 mg | DELAYED_RELEASE_TABLET | Freq: Every evening | ORAL | Status: DC
  Administered 2018-03-06 – 2018-03-07 (×2): 20 mg via ORAL
  Filled 2018-03-06 (×2): qty 1

## 2018-03-06 MED ORDER — EPHEDRINE SULFATE-NACL 50-0.9 MG/10ML-% IV SOSY
PREFILLED_SYRINGE | INTRAVENOUS | Status: DC | PRN
  Administered 2018-03-06 (×2): 5 mg via INTRAVENOUS

## 2018-03-06 MED ORDER — TRAMADOL HCL 50 MG PO TABS
50.0000 mg | ORAL_TABLET | Freq: Four times a day (QID) | ORAL | Status: DC | PRN
  Administered 2018-03-06 – 2018-03-07 (×4): 50 mg via ORAL
  Filled 2018-03-06 (×2): qty 1
  Filled 2018-03-06: qty 2
  Filled 2018-03-06: qty 1

## 2018-03-06 MED ORDER — CEFAZOLIN SODIUM-DEXTROSE 2-4 GM/100ML-% IV SOLN
INTRAVENOUS | Status: AC
  Filled 2018-03-06: qty 100

## 2018-03-06 MED ORDER — METHOCARBAMOL 500 MG PO TABS
500.0000 mg | ORAL_TABLET | Freq: Four times a day (QID) | ORAL | Status: DC | PRN
  Administered 2018-03-07 (×3): 500 mg via ORAL
  Filled 2018-03-06 (×3): qty 1

## 2018-03-06 MED ORDER — MENTHOL 3 MG MT LOZG: 1.0000 | LOZENGE | OROMUCOSAL | Status: DC | PRN

## 2018-03-06 MED ORDER — PHENOL 1.4 % MT LIQD: 1.0000 | OROMUCOSAL | Status: DC | PRN

## 2018-03-06 MED ORDER — SUGAMMADEX SODIUM 200 MG/2ML IV SOLN
INTRAVENOUS | Status: DC | PRN
  Administered 2018-03-06: 200 mg via INTRAVENOUS

## 2018-03-06 MED ORDER — ROCURONIUM BROMIDE 50 MG/5ML IV SOSY
PREFILLED_SYRINGE | INTRAVENOUS | Status: AC
  Filled 2018-03-06: qty 5

## 2018-03-06 MED ORDER — CLONAZEPAM 1 MG PO TABS: 1.0000 mg | ORAL_TABLET | Freq: Two times a day (BID) | ORAL | Status: DC | PRN

## 2018-03-06 MED ORDER — FENTANYL CITRATE (PF) 100 MCG/2ML IJ SOLN
50.0000 ug | Freq: Once | INTRAMUSCULAR | Status: AC
  Administered 2018-03-06: 50 ug via INTRAVENOUS

## 2018-03-06 MED ORDER — ONDANSETRON HCL 4 MG/2ML IJ SOLN
INTRAMUSCULAR | Status: DC | PRN
  Administered 2018-03-06: 4 mg via INTRAVENOUS

## 2018-03-06 MED ORDER — LIDOCAINE 2% (20 MG/ML) 5 ML SYRINGE
INTRAMUSCULAR | Status: AC
  Filled 2018-03-06: qty 5

## 2018-03-06 MED ORDER — DEXAMETHASONE SODIUM PHOSPHATE 10 MG/ML IJ SOLN
INTRAMUSCULAR | Status: AC
  Filled 2018-03-06: qty 1

## 2018-03-06 MED ORDER — DIPHENHYDRAMINE HCL 12.5 MG/5ML PO ELIX: 12.5000 mg | ORAL_SOLUTION | ORAL | Status: DC | PRN

## 2018-03-06 MED ORDER — DOCUSATE SODIUM 100 MG PO CAPS
100.0000 mg | ORAL_CAPSULE | Freq: Two times a day (BID) | ORAL | Status: DC
  Administered 2018-03-06 – 2018-03-08 (×4): 100 mg via ORAL
  Filled 2018-03-06 (×4): qty 1

## 2018-03-06 MED ORDER — LACTATED RINGERS IV SOLN
INTRAVENOUS | Status: DC
  Administered 2018-03-06 (×2): via INTRAVENOUS

## 2018-03-06 MED ORDER — PAROXETINE HCL 20 MG PO TABS
20.0000 mg | ORAL_TABLET | Freq: Every day | ORAL | Status: DC
  Administered 2018-03-07 – 2018-03-08 (×2): 20 mg via ORAL
  Filled 2018-03-06 (×2): qty 1

## 2018-03-06 MED ORDER — BUPIVACAINE HCL (PF) 0.5 % IJ SOLN
INTRAMUSCULAR | Status: DC | PRN
  Administered 2018-03-06: 15 mL

## 2018-03-06 MED ORDER — SODIUM CHLORIDE 0.9 % IR SOLN
Status: DC | PRN
  Administered 2018-03-06 (×2): 3000 mL

## 2018-03-06 MED ORDER — METHOCARBAMOL 1000 MG/10ML IJ SOLN
500.0000 mg | Freq: Four times a day (QID) | INTRAVENOUS | Status: DC | PRN
  Filled 2018-03-06: qty 5

## 2018-03-06 MED ORDER — MAGNESIUM OXIDE 400 (241.3 MG) MG PO TABS
400.0000 mg | ORAL_TABLET | Freq: Every day | ORAL | Status: DC
  Administered 2018-03-06 – 2018-03-08 (×3): 400 mg via ORAL
  Filled 2018-03-06: qty 1
  Filled 2018-03-06: qty 2
  Filled 2018-03-06 (×4): qty 1

## 2018-03-06 MED ORDER — TRANEXAMIC ACID 1000 MG/10ML IV SOLN
2000.0000 mg | INTRAVENOUS | Status: AC
  Administered 2018-03-06: 2000 mg via TOPICAL
  Filled 2018-03-06: qty 20

## 2018-03-06 MED ORDER — ONDANSETRON HCL 4 MG/2ML IJ SOLN: 4.0000 mg | Freq: Four times a day (QID) | INTRAMUSCULAR | Status: DC | PRN

## 2018-03-06 MED ORDER — PROPRANOLOL HCL ER 80 MG PO CP24
80.0000 mg | ORAL_CAPSULE | Freq: Every evening | ORAL | Status: DC
  Administered 2018-03-06 – 2018-03-07 (×2): 80 mg via ORAL
  Filled 2018-03-06 (×2): qty 1

## 2018-03-06 MED ORDER — MIDAZOLAM HCL 2 MG/2ML IJ SOLN
INTRAMUSCULAR | Status: AC
  Administered 2018-03-06: 2 mg via INTRAVENOUS
  Filled 2018-03-06: qty 2

## 2018-03-06 MED ORDER — FAMOTIDINE 20 MG PO TABS
20.0000 mg | ORAL_TABLET | Freq: Every day | ORAL | Status: DC
  Administered 2018-03-08: 20 mg via ORAL
  Filled 2018-03-06 (×3): qty 1

## 2018-03-06 MED ORDER — TRANEXAMIC ACID 1000 MG/10ML IV SOLN
1000.0000 mg | Freq: Once | INTRAVENOUS | Status: AC
  Administered 2018-03-06: 1000 mg via INTRAVENOUS
  Filled 2018-03-06: qty 10

## 2018-03-06 MED ORDER — ROCURONIUM BROMIDE 50 MG/5ML IV SOSY
PREFILLED_SYRINGE | INTRAVENOUS | Status: DC | PRN
  Administered 2018-03-06: 50 mg via INTRAVENOUS

## 2018-03-06 MED ORDER — FENTANYL CITRATE (PF) 100 MCG/2ML IJ SOLN
INTRAMUSCULAR | Status: AC
  Administered 2018-03-06: 50 ug via INTRAVENOUS
  Filled 2018-03-06: qty 2

## 2018-03-06 MED ORDER — PROPOFOL 10 MG/ML IV BOLUS
INTRAVENOUS | Status: DC | PRN
  Administered 2018-03-06: 150 mg via INTRAVENOUS
  Administered 2018-03-06: 50 mg via INTRAVENOUS

## 2018-03-06 MED ORDER — DEXAMETHASONE SODIUM PHOSPHATE 10 MG/ML IJ SOLN
INTRAMUSCULAR | Status: DC | PRN
  Administered 2018-03-06: 5 mg via INTRAVENOUS

## 2018-03-06 MED ORDER — BUPIVACAINE LIPOSOME 1.3 % IJ SUSP
INTRAMUSCULAR | Status: DC | PRN
  Administered 2018-03-06: 133 mg via PERINEURAL

## 2018-03-06 MED ORDER — FENTANYL CITRATE (PF) 250 MCG/5ML IJ SOLN
INTRAMUSCULAR | Status: AC
  Filled 2018-03-06: qty 5

## 2018-03-06 MED ORDER — METOCLOPRAMIDE HCL 5 MG PO TABS: 5.0000 mg | ORAL_TABLET | Freq: Three times a day (TID) | ORAL | Status: DC | PRN

## 2018-03-06 MED ORDER — ACETAMINOPHEN 500 MG PO TABS
500.0000 mg | ORAL_TABLET | Freq: Four times a day (QID) | ORAL | Status: AC
  Administered 2018-03-06 – 2018-03-07 (×3): 500 mg via ORAL
  Filled 2018-03-06 (×3): qty 1

## 2018-03-06 MED ORDER — AMLODIPINE BESYLATE 10 MG PO TABS
10.0000 mg | ORAL_TABLET | Freq: Every day | ORAL | Status: DC
  Administered 2018-03-06 – 2018-03-07 (×2): 10 mg via ORAL
  Filled 2018-03-06 (×2): qty 1

## 2018-03-06 MED ORDER — CEFAZOLIN SODIUM-DEXTROSE 2-4 GM/100ML-% IV SOLN
2.0000 g | INTRAVENOUS | Status: AC
  Administered 2018-03-06: 2 g via INTRAVENOUS

## 2018-03-06 MED ORDER — SODIUM CHLORIDE 0.9 % IV SOLN
INTRAVENOUS | Status: DC | PRN
  Administered 2018-03-06: 20 ug/min via INTRAVENOUS

## 2018-03-06 MED ORDER — 0.9 % SODIUM CHLORIDE (POUR BTL) OPTIME
TOPICAL | Status: DC | PRN
  Administered 2018-03-06: 1000 mL

## 2018-03-06 MED ORDER — MIDAZOLAM HCL 2 MG/2ML IJ SOLN
2.0000 mg | Freq: Once | INTRAMUSCULAR | Status: AC
  Administered 2018-03-06: 2 mg via INTRAVENOUS

## 2018-03-06 MED ORDER — GABAPENTIN 300 MG PO CAPS
300.0000 mg | ORAL_CAPSULE | Freq: Three times a day (TID) | ORAL | Status: DC
  Administered 2018-03-06 – 2018-03-08 (×5): 300 mg via ORAL
  Filled 2018-03-06 (×5): qty 1

## 2018-03-06 MED ORDER — HYDROMORPHONE HCL 1 MG/ML IJ SOLN: 1.0000 mg | INTRAMUSCULAR | Status: DC | PRN

## 2018-03-06 MED ORDER — ONDANSETRON HCL 4 MG/2ML IJ SOLN
INTRAMUSCULAR | Status: AC
  Filled 2018-03-06: qty 2

## 2018-03-06 SURGICAL SUPPLY — 67 items
ALCOHOL 70% 16 OZ (MISCELLANEOUS) ×2 IMPLANT
BAG DECANTER FOR FLEXI CONT (MISCELLANEOUS) ×2 IMPLANT
BIT DRILL 5/64X5 DISP (BIT) IMPLANT
BLADE SAG 18X100X1.27 (BLADE) ×2 IMPLANT
BLADE SURG 10 STRL SS (BLADE) ×2 IMPLANT
CEMENT BONE REFOBACIN R1X40 US (Cement) ×6 IMPLANT
COVER SURGICAL LIGHT HANDLE (MISCELLANEOUS) ×2 IMPLANT
DRAPE INCISE IOBAN 66X45 STRL (DRAPES) ×2 IMPLANT
DRAPE ORTHO SPLIT 77X108 STRL (DRAPES) ×2
DRAPE SURG ORHT 6 SPLT 77X108 (DRAPES) ×2 IMPLANT
DRAPE U-SHAPE 47X51 STRL (DRAPES) ×2 IMPLANT
DRSG ADAPTIC 3X8 NADH LF (GAUZE/BANDAGES/DRESSINGS) ×2 IMPLANT
DRSG PAD ABDOMINAL 8X10 ST (GAUZE/BANDAGES/DRESSINGS) ×2 IMPLANT
DURAPREP 26ML APPLICATOR (WOUND CARE) ×2 IMPLANT
ELECT BLADE 4.0 EZ CLEAN MEGAD (MISCELLANEOUS) ×2
ELECT CAUTERY BLADE 6.4 (BLADE) ×2 IMPLANT
ELECT REM PT RETURN 9FT ADLT (ELECTROSURGICAL) ×2
ELECTRODE BLDE 4.0 EZ CLN MEGD (MISCELLANEOUS) ×1 IMPLANT
ELECTRODE REM PT RTRN 9FT ADLT (ELECTROSURGICAL) ×1 IMPLANT
EVACUATOR 1/8 PVC DRAIN (DRAIN) ×2 IMPLANT
EXTRACTOR BROKEN SCREW 4 (INSTRUMENTS) ×2 IMPLANT
EXTRACTOR BROKEN SCREW 8X05 (INSTRUMENTS) ×2 IMPLANT
FLUID NSS /IRRIG 3000 ML XXX (IV SOLUTION) ×2 IMPLANT
GAUZE SPONGE 4X4 12PLY STRL (GAUZE/BANDAGES/DRESSINGS) ×2 IMPLANT
GAUZE SPONGE 4X4 12PLY STRL LF (GAUZE/BANDAGES/DRESSINGS) ×2 IMPLANT
GAUZE XEROFORM 5X9 LF (GAUZE/BANDAGES/DRESSINGS) ×2 IMPLANT
GLOVE BIO SURGEON STRL SZ7.5 (GLOVE) ×2 IMPLANT
GLOVE BIOGEL PI IND STRL 8 (GLOVE) ×1 IMPLANT
GLOVE BIOGEL PI INDICATOR 8 (GLOVE) ×1
GOWN STRL REUS W/ TWL LRG LVL3 (GOWN DISPOSABLE) IMPLANT
GOWN STRL REUS W/ TWL XL LVL3 (GOWN DISPOSABLE) ×2 IMPLANT
GOWN STRL REUS W/TWL LRG LVL3 (GOWN DISPOSABLE)
GOWN STRL REUS W/TWL XL LVL3 (GOWN DISPOSABLE) ×2
KIT BASIN OR (CUSTOM PROCEDURE TRAY) ×2 IMPLANT
KIT TURNOVER KIT B (KITS) ×2 IMPLANT
MANIFOLD NEPTUNE II (INSTRUMENTS) ×2 IMPLANT
MOLD SHOULDER S1 10 W/50X21X57 (Joint) ×1 IMPLANT
NS IRRIG 1000ML POUR BTL (IV SOLUTION) ×2 IMPLANT
PACK SHOULDER (CUSTOM PROCEDURE TRAY) ×2 IMPLANT
PAD ARMBOARD 7.5X6 YLW CONV (MISCELLANEOUS) ×4 IMPLANT
PULSAVAC PLUS IRRIG FAN TIP (DISPOSABLE) ×2
PUTTY DBM STAGRAFT PLUS 5CC (Putty) ×2 IMPLANT
RESTRAINT HEAD UNIVERSAL NS (MISCELLANEOUS) ×2 IMPLANT
SHOULDER MOLD S1 10 W/50X21X57 (Joint) ×2 IMPLANT
SLING ARM FOAM STRAP LRG (SOFTGOODS) ×2 IMPLANT
SLING ARM FOAM STRAP MED (SOFTGOODS) IMPLANT
SMARTMIX MINI TOWER (MISCELLANEOUS) ×2
SPONGE LAP 18X18 X RAY DECT (DISPOSABLE) ×4 IMPLANT
SPONGE LAP 4X18 RFD (DISPOSABLE) IMPLANT
STRIP CLOSURE SKIN 1/2X4 (GAUZE/BANDAGES/DRESSINGS) ×2 IMPLANT
SUCTION FRAZIER HANDLE 10FR (MISCELLANEOUS) ×1
SUCTION FRAZIER TIP 10 FR DISP (SUCTIONS) ×2 IMPLANT
SUCTION TUBE FRAZIER 10FR DISP (MISCELLANEOUS) ×1 IMPLANT
SUT ETHILON 3 0 PS 1 (SUTURE) ×6 IMPLANT
SUT FIBERWIRE #2 38 T-5 BLUE (SUTURE) ×4
SUT MNCRL AB 4-0 PS2 18 (SUTURE) ×2 IMPLANT
SUT VIC AB 2-0 CT1 27 (SUTURE) ×1
SUT VIC AB 2-0 CT1 TAPERPNT 27 (SUTURE) ×1 IMPLANT
SUTURE FIBERWR #2 38 T-5 BLUE (SUTURE) ×2 IMPLANT
SWAB COLLECTION DEVICE MRSA (MISCELLANEOUS) ×2 IMPLANT
SWAB CULTURE ESWAB REG 1ML (MISCELLANEOUS) ×2 IMPLANT
TIP FAN IRRIG PULSAVAC PLUS (DISPOSABLE) ×1 IMPLANT
TOWEL OR 17X26 10 PK STRL BLUE (TOWEL DISPOSABLE) ×2 IMPLANT
TOWER CARTRIDGE SMART MIX (DISPOSABLE) ×2 IMPLANT
TOWER SMARTMIX MINI (MISCELLANEOUS) ×1 IMPLANT
WATER STERILE IRR 1000ML POUR (IV SOLUTION) ×2 IMPLANT
YANKAUER SUCT BULB TIP NO VENT (SUCTIONS) ×2 IMPLANT

## 2018-03-06 NOTE — Transfer of Care (Signed)
Immediate Anesthesia Transfer of Care Note  Patient: Geoffrey West [facility]  Procedure(s) Performed: Left shoulder antibiotic spacer expalnt with glenoid bone grafting, antiobiotic spacer placement (Left Shoulder)  Patient Location: PACU  Anesthesia Type:GA combined with regional for post-op pain  Level of Consciousness: awake, alert  and oriented  Airway & Oxygen Therapy: Patient Spontanous Breathing and Patient connected to nasal cannula oxygen  Post-op Assessment: Report given to RN and Post -op Vital signs reviewed and stable  Post vital signs: Reviewed and stable  Last Vitals:  Vitals Value Taken Time  BP 107/65 03/06/2018  3:37 PM  Temp 36.4 C 03/06/2018  3:33 PM  Pulse 69 03/06/2018  3:40 PM  Resp 13 03/06/2018  3:40 PM  SpO2 96 % 03/06/2018  3:40 PM  Vitals shown include unvalidated device data.  Last Pain:  Vitals:   03/06/18 1533  TempSrc:   PainSc: 0-No pain         Complications: No apparent anesthesia complications

## 2018-03-06 NOTE — Anesthesia Preprocedure Evaluation (Addendum)
Anesthesia Evaluation  Patient identified by MRN, date of birth, ID band Patient awake    Reviewed: Allergy & Precautions, NPO status , Patient's Chart, lab work & pertinent test results  History of Anesthesia Complications Negative for: history of anesthetic complications  Airway Mallampati: II  TM Distance: >3 FB Neck ROM: Full    Dental  (+) Teeth Intact, Dental Advisory Given   Pulmonary neg pulmonary ROS,    breath sounds clear to auscultation       Cardiovascular hypertension, Pt. on medications (-) angina(-) Past MI and (-) CHF  Rhythm:Regular     Neuro/Psych PSYCHIATRIC DISORDERS Anxiety Depression negative neurological ROS     GI/Hepatic Neg liver ROS, GERD  Medicated and Controlled,  Endo/Other  negative endocrine ROS  Renal/GU negative Renal ROS     Musculoskeletal  (+) Arthritis ,   Abdominal   Peds  Hematology negative hematology ROS (+)   Anesthesia Other Findings   Reproductive/Obstetrics                            Anesthesia Physical Anesthesia Plan  ASA: II  Anesthesia Plan: General and Regional   Post-op Pain Management:    Induction: Intravenous  PONV Risk Score and Plan: 2 and Ondansetron and Dexamethasone  Airway Management Planned: Oral ETT  Additional Equipment: None  Intra-op Plan:   Post-operative Plan: Extubation in OR  Informed Consent: I have reviewed the patients History and Physical, chart, labs and discussed the procedure including the risks, benefits and alternatives for the proposed anesthesia with the patient or authorized representative who has indicated his/her understanding and acceptance.   Dental advisory given  Plan Discussed with: CRNA and Surgeon  Anesthesia Plan Comments:         Anesthesia Quick Evaluation

## 2018-03-06 NOTE — Op Note (Signed)
Date of Surgery: 03/06/2018  INDICATIONS: Geoffrey West is a 62 y.o.-year-old male with a left Prosthetic shoulder infection.  He has had multiple open left shoulder surgeries including a total shoulder arthroplasty back in 2007.  Subsequent failure of the subscapularis tendon and was converted to a reverse shoulder orthoplastic by me over a year ago.  He developed pain and loosening of that implant and was found to have a infection of that reverse shoulder orthoplastic.  Recommended a staged treatment with explant and then bone grafting of glenoid with  Antibiotic spacer exchange today.  He possessed today for that surgery.;  The patient did consent to the procedure after discussion of the risks and benefits.  PREOPERATIVE DIAGNOSIS: Left prosthetic shoulder infection  POSTOPERATIVE DIAGNOSIS: Same.  PROCEDURE: 1. Explant of humeral antibiotic spacer left shoulder 2.  Bone grafting of left glenoid fossa bone defect 3.  Excisional debridement of bone, muscle, subcutaneous tissue for deep implant infection., Left.  4.  Implantation of antibiotic spacer left shoulder  SURGEON: Geralynn Rile, M.D.  ASSIST: April Green, RNFA.  ANESTHESIA:  general, With interscalene block  IV FLUIDS AND URINE: See anesthesia.  ESTIMATED BLOOD LOSS: 500 mL.  IMPLANTS:  10 mm Biomet Prostalac antibiotic spacer   DRAINS: Hemovac x 1  COMPLICATIONS: None.  DESCRIPTION OF PROCEDURE: The patient was brought to the operating room and placed Supine on the operating table.  The patient had been signed prior to the procedure and this was documented. The patient had the anesthesia placed by the anesthesiologist.  A time-out was performed to confirm that this was the correct patient, site, side and location. The patient did receive antibiotics prior to the incision and was re-dosed during the procedure as needed at indicated intervals. After induction of general anesthesia the patient was set up into the beachchair  position.  The well arm was placed on a well-padded Mayo. The patient had the operative extremity prepped and draped in the standard surgical fashion.      We began the procedure by opening up the deltopectoral interval.  Care was taken to follow the previously marked interval with PDS suture.  Interval was opened bluntly and dissection was carried down to the level of the glenohumeral joint space.  Some time was spent there elevating the scar tissue from the lateral aspect of the humerus as well as the anterior aspect and superior aspect of the glenoid.  Deep retractors were then placed.  The humeral stem was then dislocated.  The previous placed antibiotic spacer stem was explanted without location or issue.  We then moved to perform excisional debridement of fibrinous tissue.  There was no gross contamination or gross purulence encountered.  Utilizing knife and rongeur as well as Bovie we debrided the nonviable fibrinous tissue as well as muscle, bone, and subtendinous tissue from the wound bed.  We curetted the canal of the humerus.  We also placed a 9 mm and then 10 mm hand reamer into the canal.  Fluid cultures were sent from Deep shoulder.  Of note while addressing the humerus the.  Placed cerclage cable was noted to be loose and this was cut and removed as well.  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  We elected to leave this as such with a plan to fix this to the fracture stem once we reimplant with the  reverse arthroplasty.  We then turned our attention to the glenoid.  Deep retractors were placed to expose the glenoid.  We then excisionally debrided any nonviable muscle, subtenons tissue, fibrinous tissue and bone from the glenoid vault.  This was done utilizing knife and rongeur.  We then curetted the cavitary lesion in the central portion of the glenoid fossa.  We did  encounter 1 of the previously broken peripheral locking screws.  Utilizing the hardware removal kit we did remove the central screw.  This did come out en bloc.  Next week open sleeve or go to both humeral side and glenoid side with 6 L of normal saline.   Hemostasis was then checked and assured.  We then moved to bone graft the cavitary defect.  Glenoid had a contained cavitary defect in the central portion.  We placed 4 cc of mixed cancellus chips with osteo-inductive DBXBone putty from Biomet.  We then turned our attention back to the humerus.  The 10 mm Prostalac antibiotic spacer was made on the back table.  This was implanted into place with a small metaphyseal putty technique utilizing standard bone cement.  This was allowed to dry completely.  Next the shoulder was reduced.  The wound is once again Kobie sleeve irrigated with normal saline.  We then placed #2 FiberWire sutures in the deltopectoral interval.  A deep drain was placed within the deltopectoral interval.  Subtenons tissue is closed with 2-0 Vicryl and skin closed with interrupted nylon sutures.  A standard sterile dressing was applied.  All counts were correct 2.  There were no intraoperative competitions per the patient was awakened from general anesthetic in stable condition and transferred to PACU.  POSTOPERATIVE PLAN:  Geoffrey West will resume his postoperative antibiotics from his previous procedure for the deep shoulder infection.  He will be admitted to the inpatient service postoperatively for pain control and postoperative care.  He with a train will be removed tomorrow.  He will be in his sling for proximal he 2 weeks.  He will return to see me in 2-3 weeks for a wound check and suture removal.

## 2018-03-06 NOTE — Brief Op Note (Signed)
03/06/2018  3:23 PM  PATIENT:  Geoffrey West  62 y.o. male  PRE-OPERATIVE DIAGNOSIS:  Left shoulder infected arthroplasty  POST-OPERATIVE DIAGNOSIS:  Left shoulder infected arthroplasty  PROCEDURE:  Procedure(s): Left shoulder antibiotic spacer expalnt with glenoid bone grafting, antiobiotic spacer placement (Left)  SURGEON:  Surgeon(s) and Role:    [name], MD - Primary  PHYSICIAN ASSISTANT:   ASSISTANTS: April Green, RNFA  ANESTHESIA:   General with block   EBL:  500 mL   BLOOD ADMINISTERED:none  DRAINS: (1) Hemovact drain(s) in the left shoulder with  Suction Open   LOCAL MEDICATIONS USED:  NONE  SPECIMEN:  No Specimen  DISPOSITION OF SPECIMEN:  N/A  COUNTS:  YES  TOURNIQUET:  * No tourniquets in log *  DICTATION: .Note written in EPIC  PLAN OF CARE: Admit to inpatient   PATIENT DISPOSITION:  PACU - hemodynamically stable.   Delay start of Pharmacological VTE agent (>24hrs) due to surgical blood loss or risk of bleeding: not applicable

## 2018-03-06 NOTE — Anesthesia Procedure Notes (Signed)
Procedure Name: Intubation Date/Time: 03/06/2018 1:24 PM Performed by: Kyung Rudd, CRNA Pre-anesthesia Checklist: Patient identified, Emergency Drugs available, Suction available, Patient being monitored and Timeout performed Patient Re-evaluated:Patient Re-evaluated prior to induction Oxygen Delivery Method: Circle system utilized Preoxygenation: Pre-oxygenation with 100% oxygen Induction Type: IV induction Ventilation: Mask ventilation without difficulty Laryngoscope Size: Mac and 4 Grade View: Grade I Tube type: Oral Tube size: 7.5 mm Number of attempts: 1 Airway Equipment and Method: Stylet Placement Confirmation: ETT inserted through vocal cords under direct vision,  positive ETCO2 and breath sounds checked- equal and bilateral Secured at: 21 cm Tube secured with: Tape Dental Injury: Teeth and Oropharynx as per pre-operative assessment

## 2018-03-06 NOTE — Plan of Care (Signed)
  Problem: Education: Goal: Knowledge of General Education information will improve Description: Including pain rating scale, medication(s)/side effects and non-pharmacologic comfort measures Outcome: Progressing   Problem: Education: Goal: Knowledge of General Education information will improve Description: Including pain rating scale, medication(s)/side effects and non-pharmacologic comfort measures Outcome: Progressing   Problem: Activity: Goal: Risk for activity intolerance will decrease Outcome: Progressing   Problem: Nutrition: Goal: Adequate nutrition will be maintained Outcome: Progressing   Problem: Pain Managment: Goal: General experience of comfort will improve Outcome: Progressing   Problem: Safety: Goal: Ability to remain free from injury will improve Outcome: Progressing   

## 2018-03-06 NOTE — H&P (Signed)
ORTHOPAEDIC H and P  REQUESTING PHYSICIAN: [name], MD  PCP:  Orpah Melter, MD  Chief Complaint: Infected left total shoulder  HPI: Geoffrey West is a 62 y.o. 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  He has no new complaints at this time.  He is on chronic suppressive oral antibiotics for the infection.  He denies fevers, night sweats chills.  Past Medical History:  Diagnosis Date  . Anxiety   . Arthritis   . Depression   . GERD (gastroesophageal reflux disease)   . Gout 12/25/2016  . Hypertension   . Hypertension 12/25/2016  . Low testosterone 12/25/2016  . 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 BACK SURGERY    . COLONOSCOPY    . IR FLUORO GUIDE CV LINE RIGHT  12/26/2016  . JOINT REPLACEMENT    . LASIK    . REPLACEMENT TOTAL KNEE     both knees replacments                 (MULTIPLE SURGERIES BILATERAL KNEES PRIOR TO REPLACEMET)  . REVERSE SHOULDER ARTHROPLASTY Left 12/08/2016   Procedure: Revision left total shoulder to reverse total shoulder arthroplasty;  Surgeon: [name], MD;  Location: [facility];  Service: Orthopedics;  Laterality: Left;  . REVERSE SHOULDER ARTHROPLASTY Left 01/01/2018   Procedure: Left reverse shouler explant with antibiotic spacer placement;  Surgeon: [name], MD;  Location: [facility];  Service: Orthopedics;  Laterality: Left;  2.5 hrs  . SHOULDER SURGERY    . SHOULDER SURGERY     RIGHT ARTHROPLASTY   (MULTIPLE SURGERIES)   Social History   Socioeconomic History  . Marital status: Single    Spouse name: Not on file  . Number of children: Not on file  . Years of education: Not on file  . Highest education level: Not on file  Occupational History  . Not on file  Social Needs  . Financial resource strain: Not on file  . Food insecurity:    Worry: Not on file    Inability: Not on  file  . Transportation needs:    Medical: Not on file    Non-medical: Not on file  Tobacco Use  . Smoking status: Never Smoker  . Smokeless tobacco: Never Used  Substance and Sexual Activity  . Alcohol use: No  . Drug use: No  . Sexual activity: Not on file  Lifestyle  . Physical activity:    Days per week: Not on file    Minutes per session: Not on file  . Stress: Not on file  Relationships  . Social connections:    Talks on phone: Not on file    Gets together: Not on file    Attends religious service: Not on file    Active member of club or organization: Not on file    Attends meetings of clubs or organizations: Not on file    Relationship status: Not on file  Other Topics Concern  . Not on file  Social History Narrative  . Not on file   Family History  Problem Relation Age of Onset  . Arthritis Mother   . Arthritis Sister   . Alzheimer's disease Brother   . Arthritis Brother    Allergies  Allergen Reactions  . Fentanyl Shortness Of Breath    Reaction to [facility] ONLY > ? DOSE REGULATION ?   [name]  Oxycodone Other (See Comments)    Pt states he goes through hot and cold flashes withdrawal like symptoms   Prior to Admission medications   Medication Sig Start Date End Date Taking? Authorizing Provider  allopurinol (ZYLOPRIM) 300 MG tablet Take 300 mg by mouth daily.   Yes [provider]  amLODipine (NORVASC) 10 MG tablet Take 10 mg by mouth at bedtime.    Yes [provider]  celecoxib (CELEBREX) 200 MG capsule Take 200 mg by mouth 2 (two) times daily. 11/06/16  Yes [provider]  cephALEXin (KEFLEX) 500 MG capsule Take 1 capsule (500 mg total) by mouth every 6 (six) hours. 02/07/18 06/07/18 Yes Golden Circle, FNP  Coenzyme Q10 (COQ-10) 200 MG CAPS Take 200 mg by mouth daily.   Yes [provider]  fluticasone (FLONASE) 50 MCG/ACT nasal spray Place 2 sprays into both nostrils daily as needed for allergies.  11/24/16  Yes [provider]  ibuprofen (ADVIL,MOTRIN) 200 MG tablet Take 600 mg by mouth daily as needed for headache.   Yes [provider]  Magnesium 400 MG TABS Take 400 mg by mouth daily.   Yes [provider]  Multiple Vitamin (MULTIVITAMIN WITH MINERALS) TABS tablet Take 1 tablet by mouth daily.   Yes [provider]  pantoprazole (PROTONIX) 20 MG tablet Take 20 mg by mouth every evening.   Yes [provider]  PARoxetine (PAXIL) 20 MG tablet Take 20 mg by mouth daily.   Yes [provider]  propranolol ER (INDERAL LA) 80 MG 24 hr capsule Take 80 mg by mouth every evening.  02/01/18  Yes [provider]  ranitidine (ZANTAC) 150 MG tablet Take 150 mg by mouth daily as needed for heartburn.   Yes [provider]  testosterone cypionate (DEPOTESTOSTERONE CYPIONATE) 200 MG/ML injection Inject 90 mg into the muscle every Wednesday.  08/30/16  Yes [provider]  clonazePAM (KLONOPIN) 1 MG tablet Take 1 mg by mouth daily as needed for anxiety.    [provider]  propranolol ER (INDERAL LA) 120 MG 24 hr capsule Take 1 capsule (120 mg total) by mouth daily. Patient not taking: Reported on 02/22/2018 12/25/16   Tommy Medal, Lavell Islam, MD  traMADol (ULTRAM) 50 MG tablet Take 1 tablet (50 mg total) by mouth every 6 (six) hours as needed for moderate pain. Patient not taking: Reported on 02/22/2018 01/03/18   [name], MD   No results found.  Positive ROS: All other systems have been reviewed and were otherwise negative with the exception of those mentioned in the HPI and as above.  Physical Exam: General: Alert, no acute distress Cardiovascular: No pedal edema Respiratory: No cyanosis, no use of accessory musculature GI: No organomegaly, abdomen is soft and non-tender Skin: No lesions in the area of chief complaint Neurologic: Sensation intact distally Psychiatric: Patient is competent for consent with normal mood and  affect Lymphatic: No axillary or cervical lymphadenopathy    Assessment: 1.  Status post explant of left reverse total shoulder arthroplasty. 2.  Infected left reverse shoulder arthroplasty  Plan: -Plan for stage 2 out of 3 stage revision left shoulder.  Plan for antibiotic spacer exchange with bone grafting of the glenoid.  We again reviewed the risk, benefits, indications this procedure at length.  All questions were solicited and answered to his satisfaction.  He has provided informed consent. -Plan for admission postoperatively for pain control as well as routine postoperative care.  [name], MD Cell [phone]    03/06/2018 12:29 PM

## 2018-03-06 NOTE — Anesthesia Procedure Notes (Signed)
Anesthesia Regional Block: Interscalene brachial plexus block   Pre-Anesthetic Checklist: ,, timeout performed, Correct Patient, Correct Site, Correct Laterality, Correct Procedure, Correct Position, site marked, Risks and benefits discussed,  Surgical consent,  Pre-op evaluation,  At surgeon's request and post-op pain management  Laterality: Upper and Left  Prep: chloraprep       Needles:  Injection technique: Single-shot  Needle Type: Echogenic Stimulator Needle          Additional Needles:   Procedures:,,,, ultrasound used (permanent image in chart),,,,  Narrative:  Start time: 03/06/2018 12:47 PM End time: 03/06/2018 12:50 PM Injection made incrementally with aspirations every 5 mL.  Performed by: Personally  Anesthesiologist: Oleta Mouse, MD  Additional Notes: H+P and labs reviewed, risks and benefits discussed with patient, procedure tolerated well without complications

## 2018-03-06 NOTE — Progress Notes (Signed)
1640 Received from PACU, A&O x4. Left shoulder dressing dry and intact. Hemovac in place and compressed. Mild numbness to LUE but with good and strong hand grip. Left eye is a little droopy, a result from  interscalene nerve block per Dr [name]. Speech is clear.

## 2018-03-07 ENCOUNTER — Encounter (HOSPITAL_COMMUNITY): Payer: Self-pay | Admitting: Orthopedic Surgery

## 2018-03-07 LAB — CBC
HCT: 46.9 % (ref 39.0–52.0)
Hemoglobin: 16.1 g/dL (ref 13.0–17.0)
MCH: 32.6 pg (ref 26.0–34.0)
MCHC: 34.3 g/dL (ref 30.0–36.0)
MCV: 94.9 fL (ref 78.0–100.0)
PLATELETS: 154 10*3/uL (ref 150–400)
RBC: 4.94 MIL/uL (ref 4.22–5.81)
RDW: 12.9 % (ref 11.5–15.5)
WBC: 10.8 10*3/uL — AB (ref 4.0–10.5)

## 2018-03-07 LAB — BASIC METABOLIC PANEL
Anion gap: 9 (ref 5–15)
BUN: 18 mg/dL (ref 8–23)
CALCIUM: 9.1 mg/dL (ref 8.9–10.3)
CO2: 27 mmol/L (ref 22–32)
Chloride: 102 mmol/L (ref 98–111)
Creatinine, Ser: 1.38 mg/dL — ABNORMAL HIGH (ref 0.61–1.24)
GFR, EST NON AFRICAN AMERICAN: 54 mL/min — AB (ref >60)
Glucose, Bld: 142 mg/dL — ABNORMAL HIGH (ref 70–99)
Potassium: 4 mmol/L (ref 3.5–5.1)
SODIUM: 138 mmol/L (ref 135–145)

## 2018-03-07 NOTE — Progress Notes (Signed)
   Subjective:  Patient reports pain as mild.  Block has resolved.  No complaints of CP/SOB or n/v.    Objective:   VITALS:   Vitals:   03/06/18 1948 03/06/18 2220 03/07/18 0015 03/07/18 0500  BP: (!) 128/100 (!) 128/100 116/79 123/83  Pulse: 93  75 68  Resp: 16  16 16   Temp: 98.3 F (36.8 C)  97.9 F (36.6 C) 97.6 F (36.4 C)  TempSrc: Oral  Oral Oral  SpO2: 96%  96% 100%  Weight:      Height:        Neurovascular intact Sensation intact distally Intact pulses distally Incision: dressing C/D/I Compartment soft sling in place  Lab Results  Component Value Date   WBC 10.8 (H) 03/07/2018   HGB 16.1 03/07/2018   HCT 46.9 03/07/2018   MCV 94.9 03/07/2018   PLT 154 03/07/2018   BMET    Component Value Date/Time   NA 138 03/07/2018 0356   K 4.0 03/07/2018 0356   CL 102 03/07/2018 0356   CO2 27 03/07/2018 0356   GLUCOSE 142 (H) 03/07/2018 0356   BUN 18 03/07/2018 0356   CREATININE 1.38 (H) 03/07/2018 0356   CREATININE 1.25 03/01/2017 1636   CALCIUM 9.1 03/07/2018 0356   GFRNONAA 54 (L) 03/07/2018 0356   GFRNONAA 62 03/01/2017 1636   GFRAA >60 03/07/2018 0356   GFRAA 72 03/01/2017 1636     Assessment/Plan: 1 Day Post-Op   Principal Problem:   Prosthetic shoulder infection (HCC) Active Problems:   Prosthetic shoulder infection, subsequent encounter   Advance diet - ok to be up on floor and off floor - NWB with sling for comfort - pain control today - plan for dc home tomorrow.   [name] 03/07/2018, 7:28 AM   Geralynn Rile, MD [phone]

## 2018-03-08 NOTE — Discharge Instructions (Signed)
Orthopedic discharge instructions: -No weightbearing to the left upper extremity.  Wear your sling just for comfort.  He may discontinue this at your leisure. -For pain control use ice for the left shoulder and Tylenol and/or Advil with Celebrex as needed.  Use tramadol for breakthrough pain. -He may shower with your postoperative bandages in place.  Maintain your postoperative bandage until your follow-up appointment if it remains intact. -Return to see Dr. [name] 2 to 3 weeks for wound check and suture removal.

## 2018-03-08 NOTE — Progress Notes (Signed)
   Subjective:  Pain well controlled with oral PRNs.  No complaints of CP/SOB or n/v.  No night sweats or chills.  Objective:   VITALS:   Vitals:   03/07/18 1305 03/07/18 1816 03/07/18 2046 03/08/18 0450  BP: (!) 136/98 (!) 136/98 121/76 113/82  Pulse: 66 66 62 68  Resp: 16  18 20   Temp: 97.6 F (36.4 C)  98 F (36.7 C) 97.8 F (36.6 C)  TempSrc: Oral  Oral Oral  SpO2: 96%  98% 98%  Weight:      Height:        Neurovascular intact Sensation intact distally Intact pulses distally Incision: dressing C/D/I Compartment soft sling in place  Lab Results  Component Value Date   WBC 10.8 (H) 03/07/2018   HGB 16.1 03/07/2018   HCT 46.9 03/07/2018   MCV 94.9 03/07/2018   PLT 154 03/07/2018   BMET    Component Value Date/Time   NA 138 03/07/2018 0356   K 4.0 03/07/2018 0356   CL 102 03/07/2018 0356   CO2 27 03/07/2018 0356   GLUCOSE 142 (H) 03/07/2018 0356   BUN 18 03/07/2018 0356   CREATININE 1.38 (H) 03/07/2018 0356   CREATININE 1.25 03/01/2017 1636   CALCIUM 9.1 03/07/2018 0356   GFRNONAA 54 (L) 03/07/2018 0356   GFRNONAA 62 03/01/2017 1636   GFRAA >60 03/07/2018 0356   GFRAA 72 03/01/2017 1636     Assessment/Plan: 2 Days Post-Op   Principal Problem:   Prosthetic shoulder infection (HCC) Active Problems:   Prosthetic shoulder infection, subsequent encounter   Advance diet - ok to be up on floor and off floor - NWB with sling for comfort - pain control today - plan for dc home today   [name] 03/08/2018, 7:25 AM   Geralynn Rile, MD [phone]

## 2018-03-08 NOTE — Progress Notes (Signed)
RN gave patient discharge instructions, pt stated understanding, no new prescriptions were written no PT/OT or HH ordered pt knows exercises and has pain medication at home, awaiting ride already dressed belongings packed.

## 2018-03-08 NOTE — Anesthesia Postprocedure Evaluation (Signed)
Anesthesia Post Note  Patient: Geoffrey West [facility]  Procedure(s) Performed: Left shoulder antibiotic spacer expalnt with glenoid bone grafting, antiobiotic spacer placement (Left Shoulder)     Patient location during evaluation: PACU Anesthesia Type: Regional and General Level of consciousness: awake and alert Pain management: pain level controlled Vital Signs Assessment: post-procedure vital signs reviewed and stable Respiratory status: spontaneous breathing, nonlabored ventilation, respiratory function stable and patient connected to nasal cannula oxygen Cardiovascular status: blood pressure returned to baseline and stable Postop Assessment: no apparent nausea or vomiting Anesthetic complications: no    Last Vitals:  Vitals:   03/07/18 2046 03/08/18 0450  BP: 121/76 113/82  Pulse: 62 68  Resp: 18 20  Temp: 36.7 C 36.6 C  SpO2: 98% 98%    Last Pain:  Vitals:   03/08/18 0600  TempSrc:   PainSc: 0-No pain                 Casondra Gasca

## 2018-03-11 LAB — AEROBIC/ANAEROBIC CULTURE W GRAM STAIN (SURGICAL/DEEP WOUND)

## 2018-03-11 LAB — AEROBIC/ANAEROBIC CULTURE (SURGICAL/DEEP WOUND): CULTURE: NO GROWTH

## 2018-03-13 DIAGNOSIS — E291 Testicular hypofunction: Secondary | ICD-10-CM | POA: Diagnosis not present

## 2018-03-16 NOTE — Discharge Summary (Signed)
Patient ID: Geoffrey West MRN: [id number] DOB/AGE: 1955/11/14 62 y.o.  Admit date: 03/06/2018 Discharge date: 03/08/2018  Primary Diagnosis: Left infected reverse shoulder arthroplasty Admission Diagnoses:  Past Medical History:  Diagnosis Date  . Anxiety   . Arthritis    "all over" (03/06/2018)  . GERD (gastroesophageal reflux disease)   . Gout    "on daily RX" (03/06/2018)  . Hypertension   . Hypertension 12/25/2016  . Low testosterone 12/25/2016  . Migraine    "haven't had more than 1/year since propanolol started" (03/06/2018)  . 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   Weight as of this encounter: 95.3 kg.  Procedure:  Procedure(s) (LRB): Left shoulder antibiotic spacer expalnt with glenoid bone grafting, antiobiotic spacer placement (Left)   Consults: None  HPI: Tommy presented for a staged procedure for a 3 stage revision of left total shoulder infection. Laboratory Data: Admission on 03/06/2018, Discharged on 03/08/2018  Component Date Value Ref Range Status  . Specimen Description 03/06/2018 SHOULDER LEFT   Final  . Special Requests 03/06/2018 PATIENT ON FOLLOWING ANCEF AND KEFLEX   Final  . Gram Stain 03/06/2018    Final                   Value:RARE WBC PRESENT, PREDOMINANTLY PMN NO ORGANISMS SEEN   . Culture 03/06/2018    Final                   Value:No growth aerobically or anaerobically. Performed at [facility], [place] [address], [place], [place] [address]   . Report Status 03/06/2018 03/11/2018 FINAL   Final  . WBC 03/07/2018 10.8* 4.0 - 10.5 K/uL Final  . RBC 03/07/2018 4.94  4.22 - 5.81  MIL/uL Final  . Hemoglobin 03/07/2018 16.1  13.0 - 17.0 g/dL Final  . HCT 03/07/2018 46.9  39.0 - 52.0 % Final  . MCV 03/07/2018 94.9  78.0 - 100.0 fL Final  . MCH 03/07/2018 32.6  26.0 - 34.0 pg Final  . MCHC 03/07/2018 34.3  30.0 - 36.0 g/dL Final  . RDW 03/07/2018 12.9  11.5 - 15.5 % Final  . Platelets 03/07/2018 154  150 - 400 K/uL Final   Performed at [facility], [place] [address] [place], [place] [address]  . Sodium 03/07/2018 138  135 - 145 mmol/L Final  . Potassium 03/07/2018 4.0  3.5 - 5.1 mmol/L Final  . Chloride 03/07/2018 102  98 - 111 mmol/L Final  . CO2 03/07/2018 27  22 - 32 mmol/L Final  . Glucose, Bld 03/07/2018 142* 70 - 99 mg/dL Final  . BUN 03/07/2018 18  8 - 23 mg/dL Final  . Creatinine, Ser 03/07/2018 1.38* 0.61 - 1.24 mg/dL Final  . Calcium 03/07/2018 9.1  8.9 - 10.3 mg/dL Final  . GFR calc non Af Amer 03/07/2018 54* >60 mL/min Final  . GFR calc Af Amer 03/07/2018 >60  >60 mL/min Final   Comment: (NOTE) The eGFR has been calculated using the CKD EPI equation. This calculation has not been validated in all clinical situations. eGFR's persistently <60 mL/min signify possible Chronic Kidney Disease.   [name]  Anion gap 03/07/2018 9  5 - 15 Final   Performed at [facility], [place] [address], [place], [place] [address]  Hospital Outpatient Visit on 02/28/2018  Component Date Value Ref Range Status  . WBC 02/28/2018 5.4  4.0 - 10.5 K/uL Final  . RBC 02/28/2018 5.71  4.22 - 5.81 MIL/uL Final  . Hemoglobin 02/28/2018 18.4* 13.0 - 17.0 g/dL Final  . HCT 02/28/2018 54.4* 39.0 - 52.0 % Final  . MCV 02/28/2018 95.3  78.0 - 100.0 fL Final  . MCH 02/28/2018 32.2  26.0 - 34.0 pg Final  . MCHC 02/28/2018 33.8  30.0 - 36.0 g/dL Final  . RDW 02/28/2018 12.8  11.5 - 15.5 % Final  . Platelets 02/28/2018 163  150 - 400 K/uL Final   Performed at [facility], [place] [address] [place], [place] [address]  . Sodium 02/28/2018 141  135 - 145 mmol/L Final  . Potassium  02/28/2018 3.4* 3.5 - 5.1 mmol/L Final  . Chloride 02/28/2018 104  98 - 111 mmol/L Final  . CO2 02/28/2018 26  22 - 32 mmol/L Final  . Glucose, Bld 02/28/2018 119* 70 - 99 mg/dL Final  . BUN 02/28/2018 20  8 - 23 mg/dL Final  . Creatinine, Ser 02/28/2018 1.19  0.61 - 1.24 mg/dL Final  . Calcium 02/28/2018 10.2  8.9 - 10.3 mg/dL Final  . GFR calc non Af Amer 02/28/2018 >60  >60 mL/min Final  . GFR calc Af Amer 02/28/2018 >60  >60 mL/min Final   Comment: (NOTE) The eGFR has been calculated using the CKD EPI equation. This calculation has not been validated in all clinical situations. eGFR's persistently <60 mL/min signify possible Chronic Kidney Disease.   . Anion gap 02/28/2018 11  5 - 15 Final   Performed at [facility], [place] [address] [place],  [address]     X-Rays:Dg Shoulder Left Port  Result Date: 03/06/2018 CLINICAL DATA:  Status post left shoulder antibiotic spacer explant with glenoid bone grafting and an antibiotic impregnated spacer placement. EXAM: LEFT SHOULDER - 1 VIEW COMPARISON:  Two views left shoulder 01/01/2018. FINDINGS: New surgical drain is in place. Cerclage wire about the proximal humerus has been removed. Antibiotic spacer is been exchanged. No acute abnormality is identified. IMPRESSION: Postoperative change as described.  No acute finding. Electronically Signed   By: Inge Rise M.D.   On: 03/06/2018 16:15    EKG: Orders placed or performed during the hospital encounter of 12/31/17  . EKG 12-Lead  . EKG 12-Lead     Hospital Course: Geoffrey West is a 62 y.o. who was admitted to Hospital. They were brought to the operating room on 03/06/2018 and underwent Procedure(s): Left shoulder antibiotic spacer expalnt with glenoid bone grafting, antiobiotic spacer placement.  Patient tolerated the procedure well and was later transferred to the recovery room and then to the orthopaedic floor for postoperative care.  They were given PO and IV  analgesics for pain control following their surgery.  They were given 24 hours of postoperative antibiotics of  Anti-infectives (From admission, onward)   Start     Dose/Rate Route Frequency Ordered Stop   03/06/18 1800  cephALEXin (KEFLEX) capsule 500 mg  Status:  Discontinued     500 mg Oral Every 6 hours 03/06/18 1629 03/08/18 1348   03/06/18 1230  ceFAZolin (ANCEF) IVPB 2g/100 mL premix     2 g 200 mL/hr over 30 Minutes Intravenous On call to  O.R. 03/06/18 1043 03/06/18 1330   03/06/18 1046  ceFAZolin (ANCEF) 2-4 GM/100ML-% IVPB    Note to Pharmacy:  Debbe Bales, Meredit: cabinet override      03/06/18 1046 03/06/18 1320     and started on DVT prophylaxis in the form of Aspirin.   By day two, the patient had progressed with therapy and meeting their goals.  Incision was healing well.  Patient was seen in rounds and was ready to go home.   Diet: Regular diet Activity:NWB Follow-up:in 2 weeks Disposition - Home Discharged Condition: good   Discharge Instructions    Call MD / Call 911   Complete by:  As directed    If you experience chest pain or shortness of breath, CALL 911 and be transported to the hospital emergency room.  If you develope a fever above 101 F, pus (white drainage) or increased drainage or redness at the wound, or calf pain, call your surgeon's office.   Constipation Prevention   Complete by:  As directed    Drink plenty of fluids.  Prune juice may be helpful.  You may use a stool softener, such as Colace (over the counter) 100 mg twice a day.  Use MiraLax (over the counter) for constipation as needed.   Diet - low sodium heart healthy   Complete by:  As directed    Increase activity slowly as tolerated   Complete by:  As directed      Allergies as of 03/08/2018      Reactions   Fentanyl Shortness Of Breath   Reaction to [facility] ONLY > ? DOSE REGULATION ?   Oxycodone Other (See Comments)   Pt states he goes through hot and cold flashes withdrawal like symptoms       Medication List    TAKE these medications   allopurinol 300 MG tablet Commonly known as:  ZYLOPRIM Take 300 mg by mouth daily.   amLODipine 10 MG tablet Commonly known as:  NORVASC Take 10 mg by mouth at bedtime.   celecoxib 200 MG capsule Commonly known as:  CELEBREX Take 200 mg by mouth 2 (two) times daily.   cephALEXin 500 MG capsule Commonly known as:  KEFLEX Take 1 capsule (500 mg total) by mouth every 6 (six) hours.   clonazePAM 1 MG tablet Commonly known as:  KLONOPIN Take 1 mg by mouth daily as needed for anxiety.   CoQ-10 200 MG Caps Take 200 mg by mouth daily.   fluticasone 50 MCG/ACT nasal spray Commonly known as:  FLONASE Place 2 sprays into both nostrils daily as needed for allergies.   ibuprofen 200 MG tablet Commonly known as:  ADVIL,MOTRIN Take 600 mg by mouth daily as needed for headache.   Magnesium 400 MG Tabs Take 400 mg by mouth daily.   multivitamin with minerals Tabs tablet Take 1 tablet by mouth daily.   pantoprazole 20 MG tablet Commonly known as:  PROTONIX Take 20 mg by mouth every evening.   PARoxetine 20 MG tablet Commonly known as:  PAXIL Take 20 mg by mouth daily.   propranolol ER 120 MG 24 hr capsule Commonly known as:  INDERAL LA Take 1 capsule (120 mg total) by mouth daily.   propranolol ER 80 MG 24 hr capsule Commonly known as:  INDERAL LA Take 80 mg by mouth every evening.   ranitidine 150 MG tablet Commonly known as:  ZANTAC Take 150 mg by mouth daily as needed for heartburn.   testosterone cypionate 200 MG/ML  injection Commonly known as:  DEPOTESTOSTERONE CYPIONATE Inject 90 mg into the muscle every Wednesday.   traMADol 50 MG tablet Commonly known as:  ULTRAM Take 1 tablet (50 mg total) by mouth every 6 (six) hours as needed for moderate pain.      Follow-up Information    [name], MD In 3 weeks.   Specialty:  Orthopedic Surgery Why:  For suture removal, For wound re-check Contact  information: [address] [place] [address] [place] [address] [phone]           Signed: Geralynn Rile, MD Orthopaedic Surgery 03/16/2018, 10:25 AM

## 2018-03-20 DIAGNOSIS — E291 Testicular hypofunction: Secondary | ICD-10-CM | POA: Diagnosis not present

## 2018-03-25 ENCOUNTER — Encounter: Payer: Self-pay | Admitting: Family

## 2018-03-25 ENCOUNTER — Ambulatory Visit (INDEPENDENT_AMBULATORY_CARE_PROVIDER_SITE_OTHER): Payer: PPO | Admitting: Family

## 2018-03-25 VITALS — BP 134/90 | HR 62 | Temp 97.3°F | Ht 71.0 in | Wt 216.0 lb

## 2018-03-25 DIAGNOSIS — T8459XD Infection and inflammatory reaction due to other internal joint prosthesis, subsequent encounter: Secondary | ICD-10-CM

## 2018-03-25 DIAGNOSIS — Z96619 Presence of unspecified artificial shoulder joint: Secondary | ICD-10-CM | POA: Diagnosis not present

## 2018-03-25 NOTE — Assessment & Plan Note (Signed)
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 Continues on Keflex with no significant adverse side effects. Plan is to replace shoulder in December. We discussed the risks associated with re-infection. At this time I think it is best to continue his Keflex through at least his next surgery and likely 3-6 months following surgery. There is no evidence of infection currently. Plan for follow up with Dr. Tommy Medal in 3 months or sooner if needed.

## 2018-03-25 NOTE — Patient Instructions (Signed)
Nice to see you.  Please continue to take your Keflex as prescribed.  Plan for follow up with Dr. Tommy Medal in 3 months or sooner if needed.

## 2018-03-25 NOTE — Progress Notes (Signed)
Subjective:    Patient ID: Geoffrey West, male    DOB: 1955-08-31, 62 y.o.   MRN: [id number]  Chief Complaint  Patient presents with  . Prosthetic Joint Infection     HPI:  Geoffrey West is a 62 y.o. male who presents today for routine follow up of prosthetic shoulder infection.   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 He was continued on Keflex with the plan of shoulder replacement in September. On 9/11 underwent antibiotic spacer exchange with no purulence or  and was continued on his Keflex.   Geoffrey West has been taking his Keflex as prescribed with only occasional gastrointestinal upset. He has only missed 1-2 doses. Shoulder is feeling good and has appointment with orthopedics this afternoon to have the stiches removed. Plan is for replacement of of the shoulder in December. He denies any signs of wound infection, fevers, chills or sweats.   Allergies  Allergen Reactions  . Fentanyl Shortness Of Breath    Reaction to [facility] ONLY > ? DOSE REGULATION ?   Geoffrey West Oxycodone Other (See Comments)    Pt states he goes through hot and cold flashes withdrawal like symptoms      Outpatient Medications Prior to Visit  Medication Sig Dispense Refill  . allopurinol (ZYLOPRIM) 300 MG tablet Take 300 mg by mouth daily.    Geoffrey West amLODipine (NORVASC) 10 MG tablet Take 10 mg by mouth at bedtime.     . celecoxib (CELEBREX) 200 MG capsule Take 200 mg by mouth 2 (two) times daily.  3  . cephALEXin (KEFLEX) 500 MG capsule Take 1 capsule (500 mg total) by mouth every 6 (six) hours. 120 capsule 3  . Coenzyme Q10 (COQ-10) 200 MG CAPS Take 200 mg by mouth daily.    . fluticasone (FLONASE) 50 MCG/ACT nasal spray Place 2 sprays into both nostrils daily as needed for allergies.   3  . ibuprofen (ADVIL,MOTRIN) 200 MG tablet Take 600 mg by mouth daily as needed for  headache.    . Magnesium 400 MG TABS Take 400 mg by mouth daily.    . Multiple Vitamin (MULTIVITAMIN WITH MINERALS) TABS tablet Take 1 tablet by mouth daily.    . pantoprazole (PROTONIX) 20 MG tablet Take 20 mg by mouth every evening.    Geoffrey West PARoxetine (PAXIL) 20 MG tablet Take 20 mg by mouth daily.    . propranolol ER (INDERAL LA) 120 MG 24 hr capsule Take 1 capsule (120 mg total) by mouth daily. 30 capsule 2  . propranolol ER (INDERAL LA) 80 MG 24 hr capsule Take 80 mg by mouth every evening.   1  . ranitidine (ZANTAC) 150 MG tablet Take 150 mg by mouth daily as needed for heartburn.    . testosterone cypionate (DEPOTESTOSTERONE CYPIONATE) 200 MG/ML injection Inject 90 mg into the muscle every Wednesday.   5  . traMADol (ULTRAM) 50 MG tablet Take 1 tablet (50 mg total) by mouth every 6 (six) hours as needed for moderate pain. 50 tablet 0  . clonazePAM (KLONOPIN) 1 MG tablet Take 1 mg by mouth daily as needed for anxiety.     No facility-administered medications prior to visit.      Past Medical History:  Diagnosis Date  . Anxiety   . Arthritis    "all over" (03/06/2018)  . GERD (gastroesophageal reflux disease)   . Gout    "  on daily RX" (03/06/2018)  . Hypertension   . Hypertension 12/25/2016  . Low testosterone 12/25/2016  . Migraine    "haven't had more than 1/year since propanolol started" (03/06/2018)  . Prosthetic shoulder infection ([place]) 12/25/2016  . Situational depression    "recently lost my mom and brother" (03/06/2018)     Past Surgical History:  Procedure Laterality Date  . BACK SURGERY    . COLONOSCOPY    . COLONOSCOPY W/ BIOPSIES AND POLYPECTOMY    . EXCISIONAL TOTAL SHOULDER ARTHROPLASTY WITH ANTIBIOTIC SPACER Left 03/06/2018   Procedure: Left shoulder antibiotic spacer expalnt with glenoid bone grafting, antiobiotic spacer placement;  Surgeon: [name], MD;  Location: [place];  Service: Orthopedics;  Laterality: Left;  . IR FLUORO GUIDE CV LINE RIGHT   12/26/2016  . JOINT REPLACEMENT    . KNEE ARTHROSCOPY Bilateral    X 10 total arthroscopies  . KNEE CARTILAGE SURGERY Left 1977   "cartilage removed"  . LASIK Bilateral   . LUMBAR DISC SURGERY  2000s   "ruptured disc"  . REVERSE SHOULDER ARTHROPLASTY Left 12/08/2016   Procedure: Revision left total shoulder to reverse total shoulder arthroplasty;  Surgeon: [name], MD;  Location: [place];  Service: Orthopedics;  Laterality: Left;  . REVERSE SHOULDER ARTHROPLASTY Left 01/01/2018   Procedure: Left reverse shouler explant with antibiotic spacer placement;  Surgeon: [name], MD;  Location: [facility];  Service: Orthopedics;  Laterality: Left;  2.5 hrs  . SHOULDER ARTHROSCOPY Right 1980s/1990s   "bone spur removed"  . SHOULDER OPEN ROTATOR CUFF REPAIR Right 1970s  . SHOULDER SURGERY Left 03/06/2018   antibiotic spacer expalnt with glenoid bone grafting, antiobiotic spacer placement  . TOTAL KNEE ARTHROPLASTY Bilateral   . TOTAL SHOULDER ARTHROPLASTY Left ~ 2014-2017 X 2   "in [place]"  . TOTAL SHOULDER REPLACEMENT Right ~ 2009   "in [place]"       Review of Systems  Constitutional: Negative for chills, diaphoresis, fatigue, fever and unexpected weight change.  Respiratory: Negative for chest tightness and shortness of breath.   Cardiovascular: Negative for chest pain.  Skin: Negative for rash.      Objective:    BP 134/90   Pulse 62   Temp (!) 97.3 F (36.3 C) (Oral)   Ht 5\' 11"  (1.803 m)   Wt 216 lb (98 kg)   BMI 30.13 kg/m  Nursing note and vital signs reviewed.  Physical Exam  Constitutional: He is oriented to person, place, and time. He appears well-developed and well-nourished. No distress.  Cardiovascular: Normal rate, regular rhythm, normal heart sounds and intact distal pulses.  Pulmonary/Chest: Effort normal and breath sounds normal. No stridor. No respiratory distress. He has no wheezes. He has no rales. He exhibits no tenderness.    Musculoskeletal:  Left shoulder surgical incision appears with good approximation and sutures intact. There is no drainage, odor or significant evidence of infection.   Neurological: He is alert and oriented to person, place, and time.  Skin: Skin is warm and dry.  Psychiatric: He has a normal mood and affect. His behavior is normal. Judgment and thought content normal.       Assessment & Plan:   Problem List Items Addressed This Visit      Musculoskeletal and Integument   Prosthetic shoulder infection ([place]) - Primary    Mr. Mcdiarmid continues to be treated for a prosthetic joint infection with proprionbacterium acnes of his left shoulder and is status post antibiotic spacer  exchange. Continues on Keflex with no significant adverse side effects. Plan is to replace shoulder in December. We discussed the risks associated with re-infection. At this time I think it is best to continue his Keflex through at least his next surgery and likely 3-6 months following surgery. There is no evidence of infection currently. Plan for follow up with Dr. Tommy Medal in 3 months or sooner if needed.           I am having Honor Loh. Chasteen "Tommy" maintain his allopurinol, multivitamin with minerals, amLODipine, celecoxib, fluticasone, testosterone cypionate, propranolol ER, traMADol, cephALEXin, propranolol ER, pantoprazole, PARoxetine, CoQ-10, Magnesium, clonazePAM, ibuprofen, and ranitidine.   Follow-up: Return in about 3 months (around 06/24/2018), or if symptoms worsen or fail to improve.   Terri Piedra, MSN, FNP-C Nurse Practitioner [facility] [place] [facility] Office phone: [phone] Pager: [place] number: [phone]

## 2018-03-27 DIAGNOSIS — E291 Testicular hypofunction: Secondary | ICD-10-CM | POA: Diagnosis not present

## 2018-04-03 DIAGNOSIS — E291 Testicular hypofunction: Secondary | ICD-10-CM | POA: Diagnosis not present

## 2018-04-10 DIAGNOSIS — E291 Testicular hypofunction: Secondary | ICD-10-CM | POA: Diagnosis not present

## 2018-04-17 DIAGNOSIS — E291 Testicular hypofunction: Secondary | ICD-10-CM | POA: Diagnosis not present

## 2018-04-23 ENCOUNTER — Other Ambulatory Visit: Payer: Self-pay | Admitting: Orthopedic Surgery

## 2018-04-23 DIAGNOSIS — Z4789 Encounter for other orthopedic aftercare: Secondary | ICD-10-CM | POA: Diagnosis not present

## 2018-04-24 DIAGNOSIS — E291 Testicular hypofunction: Secondary | ICD-10-CM | POA: Diagnosis not present

## 2018-04-29 ENCOUNTER — Ambulatory Visit
Admission: RE | Admit: 2018-04-29 | Discharge: 2018-04-29 | Disposition: A | Discharge status: Home / Self Care | Payer: PPO | Source: Ambulatory Visit | Attending: Orthopedic Surgery | Admitting: Orthopedic Surgery

## 2018-04-29 DIAGNOSIS — Z96612 Presence of left artificial shoulder joint: Secondary | ICD-10-CM | POA: Diagnosis not present

## 2018-04-29 DIAGNOSIS — Z4789 Encounter for other orthopedic aftercare: Secondary | ICD-10-CM

## 2018-04-29 DIAGNOSIS — Z471 Aftercare following joint replacement surgery: Secondary | ICD-10-CM | POA: Diagnosis not present

## 2018-04-30 DIAGNOSIS — E291 Testicular hypofunction: Secondary | ICD-10-CM | POA: Diagnosis not present

## 2018-05-08 DIAGNOSIS — E291 Testicular hypofunction: Secondary | ICD-10-CM | POA: Diagnosis not present

## 2018-05-15 DIAGNOSIS — E291 Testicular hypofunction: Secondary | ICD-10-CM | POA: Diagnosis not present

## 2018-05-22 DIAGNOSIS — E291 Testicular hypofunction: Secondary | ICD-10-CM | POA: Diagnosis not present

## 2018-06-03 NOTE — Pre-Procedure Instructions (Signed)
Geoffrey West [facility]  06/03/2018      [id number] - [place], [place] [address] Phone: [phone] Fax: [phone]    Your procedure is scheduled on Tuesday December 17th.  Report to [facility] Admitting at 1030 A.M.  Call this number if you have problems the morning of surgery:  [phone]   Remember:  Do not eat or drink after midnight.    Take these medicines the morning of surgery with A SIP OF WATER  allopurinol (ZYLOPRIM) cephALEXin (KEFLEX) clonazePAM (KLONOPIN) if needed fluticasone (FLONASE) if needed ranitidine (ZANTAC) if needed traMADol (ULTRAM if needed  7 days prior to surgery STOP taking any celecoxib (CELEBREX), Aspirin(unless otherwise instructed by your surgeon), Aleve, Naproxen, Ibuprofen, Motrin, Advil, Goody's, BC's, all herbal medications, fish oil, and all vitamins     Do not wear jewelry.  Do not wear lotions, powders, or colognes, or deodorant.  Men may shave face and neck.  Do not bring valuables to the hospital.  [facility] is not responsible for any belongings or valuables.  Contacts, dentures or bridgework may not be worn into surgery.  Leave your suitcase in the car.  After surgery it may be brought to your room.  For patients admitted to the hospital, discharge time will be determined by your treatment team.  Patients discharged the day of surgery will not be allowed to drive home.    [facility]- Preparing For Surgery  Before surgery, you can play an important role. Because skin is not sterile, your skin needs to be as free of germs as possible. You can reduce the number of germs on your skin by washing with CHG (chlorahexidine gluconate) Soap before surgery.  CHG is an antiseptic cleaner which kills germs and bonds with the skin to continue killing germs even after washing.    Oral Hygiene is also important to reduce your risk of infection.  Remember - BRUSH YOUR TEETH THE  MORNING OF SURGERY WITH YOUR REGULAR TOOTHPASTE  Please do not use if you have an allergy to CHG or antibacterial soaps. If your skin becomes reddened/irritated stop using the CHG.  Do not shave (including legs and underarms) for at least 48 hours prior to first CHG shower. It is OK to shave your face.  Please follow these instructions carefully.   1. Shower the NIGHT BEFORE SURGERY and the MORNING OF SURGERY with CHG.   2. If you chose to wash your hair, wash your hair first as usual with your normal shampoo.  3. After you shampoo, rinse your hair and body thoroughly to remove the shampoo.  4. Use CHG as you would any other liquid soap. You can apply CHG directly to the skin and wash gently with a scrungie or a clean washcloth.   5. Apply the CHG Soap to your body ONLY FROM THE NECK DOWN.  Do not use on open wounds or open sores. Avoid contact with your eyes, ears, mouth and genitals (private parts). Wash Face and genitals (private parts)  with your normal soap.  6. Wash thoroughly, paying special attention to the area where your surgery will be performed.  7. Thoroughly rinse your body with warm water from the neck down.  8. DO NOT shower/wash with your normal soap after using and rinsing off the CHG Soap.  9. Pat yourself dry with a CLEAN TOWEL.  10. Wear CLEAN PAJAMAS to bed the night before surgery, wear comfortable clothes  the morning of surgery  11. Place CLEAN SHEETS on your bed the night of your first shower and DO NOT SLEEP WITH PETS.    Day of Surgery:  Do not apply any deodorants/lotions.  Please wear clean clothes to the hospital/surgery center.   Remember to brush your teeth WITH YOUR REGULAR TOOTHPASTE.    Please read over the following fact sheets that you were given.

## 2018-06-04 ENCOUNTER — Encounter (HOSPITAL_COMMUNITY): Payer: Self-pay

## 2018-06-04 ENCOUNTER — Other Ambulatory Visit: Payer: Self-pay

## 2018-06-04 ENCOUNTER — Encounter (HOSPITAL_COMMUNITY)
Admission: RE | Admit: 2018-06-04 | Discharge: 2018-06-04 | Disposition: A | Discharge status: Home / Self Care | Payer: PPO | Source: Ambulatory Visit | Attending: Orthopedic Surgery | Admitting: Orthopedic Surgery

## 2018-06-04 DIAGNOSIS — Z01812 Encounter for preprocedural laboratory examination: Secondary | ICD-10-CM | POA: Insufficient documentation

## 2018-06-04 LAB — SURGICAL PCR SCREEN
MRSA, PCR: NEGATIVE
STAPHYLOCOCCUS AUREUS: NEGATIVE

## 2018-06-04 LAB — BASIC METABOLIC PANEL
Anion gap: 11 (ref 5–15)
BUN: 18 mg/dL (ref 8–23)
CO2: 29 mmol/L (ref 22–32)
CREATININE: 1.25 mg/dL — AB (ref 0.61–1.24)
Calcium: 10.1 mg/dL (ref 8.9–10.3)
Chloride: 101 mmol/L (ref 98–111)
Glucose, Bld: 112 mg/dL — ABNORMAL HIGH (ref 70–99)
POTASSIUM: 3.9 mmol/L (ref 3.5–5.1)
SODIUM: 141 mmol/L (ref 135–145)

## 2018-06-04 LAB — CBC
HCT: 55.6 % — ABNORMAL HIGH (ref 39.0–52.0)
HEMOGLOBIN: 18.3 g/dL — AB (ref 13.0–17.0)
MCH: 31.7 pg (ref 26.0–34.0)
MCHC: 32.9 g/dL (ref 30.0–36.0)
MCV: 96.2 fL (ref 80.0–100.0)
PLATELETS: 177 10*3/uL (ref 150–400)
RBC: 5.78 MIL/uL (ref 4.22–5.81)
RDW: 13.1 % (ref 11.5–15.5)
WBC: 5.1 10*3/uL (ref 4.0–10.5)
nRBC: 0 % (ref 0.0–0.2)

## 2018-06-04 NOTE — Progress Notes (Signed)
PCP - Orpah Melter MD  Chest x-ray - N/A  EKG - 12/31/17 ECHO - 2011  Blood Thinner Instructions: N/A Aspirin Instructions: N/A  Anesthesia review: N/A  Patient denies shortness of breath, fever, cough and chest pain at PAT appointment   Patient verbalized understanding of instructions that were given to them at the PAT appointment. Patient was also instructed that they will need to review over the PAT instructions again at home before surgery.

## 2018-06-10 MED ORDER — TRANEXAMIC ACID-NACL 1000-0.7 MG/100ML-% IV SOLN
1000.0000 mg | INTRAVENOUS | Status: AC
  Administered 2018-06-11: 1000 mg via INTRAVENOUS
  Filled 2018-06-10: qty 100

## 2018-06-11 ENCOUNTER — Inpatient Hospital Stay (HOSPITAL_COMMUNITY): Payer: PPO

## 2018-06-11 ENCOUNTER — Encounter (HOSPITAL_COMMUNITY): Payer: Self-pay | Admitting: *Deleted

## 2018-06-11 ENCOUNTER — Inpatient Hospital Stay (HOSPITAL_COMMUNITY): Payer: PPO | Admitting: Anesthesiology

## 2018-06-11 ENCOUNTER — Encounter (HOSPITAL_COMMUNITY)
Admission: RE | Disposition: A | Discharge status: Home / Self Care | Payer: Self-pay | Source: Ambulatory Visit | Attending: Orthopedic Surgery

## 2018-06-11 ENCOUNTER — Inpatient Hospital Stay (HOSPITAL_COMMUNITY)
Admission: RE | Admit: 2018-06-11 | Discharge: 2018-06-12 | DRG: 483 | Disposition: A | Discharge status: Home / Self Care | Payer: PPO | Source: Ambulatory Visit | Attending: Orthopedic Surgery | Admitting: Orthopedic Surgery

## 2018-06-11 DIAGNOSIS — F419 Anxiety disorder, unspecified: Secondary | ICD-10-CM | POA: Diagnosis present

## 2018-06-11 DIAGNOSIS — T8459XA Infection and inflammatory reaction due to other internal joint prosthesis, initial encounter: Secondary | ICD-10-CM | POA: Diagnosis not present

## 2018-06-11 DIAGNOSIS — Z885 Allergy status to narcotic agent status: Secondary | ICD-10-CM | POA: Diagnosis not present

## 2018-06-11 DIAGNOSIS — Z471 Aftercare following joint replacement surgery: Secondary | ICD-10-CM | POA: Diagnosis not present

## 2018-06-11 DIAGNOSIS — Y831 Surgical operation with implant of artificial internal device as the cause of abnormal reaction of the patient, or of later complication, without mention of misadventure at the time of the procedure: Secondary | ICD-10-CM | POA: Diagnosis present

## 2018-06-11 DIAGNOSIS — Z9889 Other specified postprocedural states: Secondary | ICD-10-CM | POA: Diagnosis present

## 2018-06-11 DIAGNOSIS — Z791 Long term (current) use of non-steroidal anti-inflammatories (NSAID): Secondary | ICD-10-CM | POA: Diagnosis not present

## 2018-06-11 DIAGNOSIS — G8918 Other acute postprocedural pain: Secondary | ICD-10-CM | POA: Diagnosis not present

## 2018-06-11 DIAGNOSIS — Z96612 Presence of left artificial shoulder joint: Secondary | ICD-10-CM | POA: Diagnosis present

## 2018-06-11 DIAGNOSIS — I10 Essential (primary) hypertension: Secondary | ICD-10-CM | POA: Diagnosis not present

## 2018-06-11 DIAGNOSIS — M109 Gout, unspecified: Secondary | ICD-10-CM | POA: Diagnosis present

## 2018-06-11 DIAGNOSIS — Z96653 Presence of artificial knee joint, bilateral: Secondary | ICD-10-CM | POA: Diagnosis present

## 2018-06-11 DIAGNOSIS — K219 Gastro-esophageal reflux disease without esophagitis: Secondary | ICD-10-CM | POA: Diagnosis not present

## 2018-06-11 DIAGNOSIS — Z79899 Other long term (current) drug therapy: Secondary | ICD-10-CM

## 2018-06-11 HISTORY — PX: REVERSE SHOULDER ARTHROPLASTY: SHX5054

## 2018-06-11 SURGERY — ARTHROPLASTY, SHOULDER, TOTAL, REVERSE
Anesthesia: Regional | Site: Shoulder | Laterality: Left

## 2018-06-11 MED ORDER — CEFAZOLIN SODIUM-DEXTROSE 2-4 GM/100ML-% IV SOLN
2.0000 g | Freq: Four times a day (QID) | INTRAVENOUS | Status: AC
  Administered 2018-06-11 – 2018-06-12 (×3): 2 g via INTRAVENOUS
  Filled 2018-06-11 (×3): qty 100

## 2018-06-11 MED ORDER — LIDOCAINE 2% (20 MG/ML) 5 ML SYRINGE
INTRAMUSCULAR | Status: DC | PRN
  Administered 2018-06-11: 60 mg via INTRAVENOUS

## 2018-06-11 MED ORDER — FENTANYL CITRATE (PF) 100 MCG/2ML IJ SOLN
50.0000 ug | Freq: Once | INTRAMUSCULAR | Status: AC
  Administered 2018-06-11: 50 ug via INTRAVENOUS

## 2018-06-11 MED ORDER — MAGNESIUM OXIDE 400 (241.3 MG) MG PO TABS
400.0000 mg | ORAL_TABLET | Freq: Every day | ORAL | Status: DC
  Administered 2018-06-11 – 2018-06-12 (×2): 400 mg via ORAL
  Filled 2018-06-11 (×2): qty 1

## 2018-06-11 MED ORDER — ASPIRIN EC 325 MG PO TBEC
325.0000 mg | DELAYED_RELEASE_TABLET | Freq: Every day | ORAL | Status: DC
  Administered 2018-06-11 – 2018-06-12 (×2): 325 mg via ORAL
  Filled 2018-06-11 (×2): qty 1

## 2018-06-11 MED ORDER — FAMOTIDINE 20 MG PO TABS
20.0000 mg | ORAL_TABLET | Freq: Two times a day (BID) | ORAL | Status: DC
  Filled 2018-06-11 (×3): qty 1

## 2018-06-11 MED ORDER — CELECOXIB 200 MG PO CAPS
200.0000 mg | ORAL_CAPSULE | Freq: Two times a day (BID) | ORAL | Status: DC
  Administered 2018-06-11 – 2018-06-12 (×2): 200 mg via ORAL
  Filled 2018-06-11 (×2): qty 1

## 2018-06-11 MED ORDER — DEXAMETHASONE SODIUM PHOSPHATE 10 MG/ML IJ SOLN
INTRAMUSCULAR | Status: AC
  Filled 2018-06-11: qty 1

## 2018-06-11 MED ORDER — SODIUM CHLORIDE 0.9 % IV SOLN
INTRAVENOUS | Status: DC | PRN
  Administered 2018-06-11: 25 ug/min via INTRAVENOUS

## 2018-06-11 MED ORDER — DEXAMETHASONE SODIUM PHOSPHATE 10 MG/ML IJ SOLN
INTRAMUSCULAR | Status: DC | PRN
  Administered 2018-06-11: 10 mg via INTRAVENOUS

## 2018-06-11 MED ORDER — ONDANSETRON HCL 4 MG/2ML IJ SOLN
INTRAMUSCULAR | Status: AC
  Filled 2018-06-11: qty 2

## 2018-06-11 MED ORDER — ACETAMINOPHEN 500 MG PO TABS: 1000.0000 mg | ORAL_TABLET | Freq: Once | ORAL | Status: DC | PRN

## 2018-06-11 MED ORDER — MENTHOL 3 MG MT LOZG: 1.0000 | LOZENGE | OROMUCOSAL | Status: DC | PRN

## 2018-06-11 MED ORDER — METOCLOPRAMIDE HCL 5 MG PO TABS: 5.0000 mg | ORAL_TABLET | Freq: Three times a day (TID) | ORAL | Status: DC | PRN

## 2018-06-11 MED ORDER — TRAMADOL HCL 50 MG PO TABS
50.0000 mg | ORAL_TABLET | Freq: Four times a day (QID) | ORAL | Status: DC
  Administered 2018-06-11 – 2018-06-12 (×3): 50 mg via ORAL
  Filled 2018-06-11 (×3): qty 1

## 2018-06-11 MED ORDER — SODIUM CHLORIDE 0.9 % IR SOLN
Status: DC | PRN
  Administered 2018-06-11: 1000 mL

## 2018-06-11 MED ORDER — EPHEDRINE 5 MG/ML INJ
INTRAVENOUS | Status: AC
  Filled 2018-06-11: qty 10

## 2018-06-11 MED ORDER — LACTATED RINGERS IV SOLN
INTRAVENOUS | Status: DC
  Administered 2018-06-11 (×2): via INTRAVENOUS

## 2018-06-11 MED ORDER — ACETAMINOPHEN 10 MG/ML IV SOLN: 1000.0000 mg | Freq: Once | INTRAVENOUS | Status: DC | PRN

## 2018-06-11 MED ORDER — ACETAMINOPHEN 160 MG/5ML PO SOLN: 1000.0000 mg | Freq: Once | ORAL | Status: DC | PRN

## 2018-06-11 MED ORDER — MAGNESIUM 400 MG PO TABS: 400.0000 mg | ORAL_TABLET | Freq: Every day | ORAL | Status: DC

## 2018-06-11 MED ORDER — ROCURONIUM BROMIDE 10 MG/ML (PF) SYRINGE
PREFILLED_SYRINGE | INTRAVENOUS | Status: DC | PRN
  Administered 2018-06-11: 50 mg via INTRAVENOUS
  Administered 2018-06-11: 10 mg via INTRAVENOUS
  Administered 2018-06-11: 40 mg via INTRAVENOUS

## 2018-06-11 MED ORDER — GABAPENTIN 300 MG PO CAPS
300.0000 mg | ORAL_CAPSULE | Freq: Three times a day (TID) | ORAL | Status: DC
  Administered 2018-06-11 – 2018-06-12 (×2): 300 mg via ORAL
  Filled 2018-06-11 (×2): qty 1

## 2018-06-11 MED ORDER — ALLOPURINOL 300 MG PO TABS
300.0000 mg | ORAL_TABLET | Freq: Every day | ORAL | Status: DC
  Administered 2018-06-11 – 2018-06-12 (×2): 300 mg via ORAL
  Filled 2018-06-11 (×2): qty 1

## 2018-06-11 MED ORDER — PROPRANOLOL HCL ER 80 MG PO CP24
80.0000 mg | ORAL_CAPSULE | Freq: Every evening | ORAL | Status: DC
  Administered 2018-06-11: 80 mg via ORAL
  Filled 2018-06-11 (×2): qty 1

## 2018-06-11 MED ORDER — HYDROCODONE-ACETAMINOPHEN 7.5-325 MG PO TABS: 1.0000 | ORAL_TABLET | ORAL | Status: DC | PRN

## 2018-06-11 MED ORDER — PHENYLEPHRINE 40 MCG/ML (10ML) SYRINGE FOR IV PUSH (FOR BLOOD PRESSURE SUPPORT)
PREFILLED_SYRINGE | INTRAVENOUS | Status: AC
  Filled 2018-06-11: qty 10

## 2018-06-11 MED ORDER — HYDROCODONE-ACETAMINOPHEN 7.5-325 MG PO TABS: 1.0000 | ORAL_TABLET | Freq: Once | ORAL | Status: DC | PRN

## 2018-06-11 MED ORDER — CLONAZEPAM 1 MG PO TABS: 1.0000 mg | ORAL_TABLET | Freq: Two times a day (BID) | ORAL | Status: DC | PRN

## 2018-06-11 MED ORDER — CEFAZOLIN SODIUM-DEXTROSE 2-4 GM/100ML-% IV SOLN
2.0000 g | INTRAVENOUS | Status: AC
  Administered 2018-06-11: 2 g via INTRAVENOUS
  Filled 2018-06-11: qty 100

## 2018-06-11 MED ORDER — METHOCARBAMOL 1000 MG/10ML IJ SOLN
500.0000 mg | Freq: Four times a day (QID) | INTRAVENOUS | Status: DC | PRN
  Filled 2018-06-11: qty 5

## 2018-06-11 MED ORDER — DOCUSATE SODIUM 100 MG PO CAPS
100.0000 mg | ORAL_CAPSULE | Freq: Two times a day (BID) | ORAL | Status: DC
  Administered 2018-06-11 – 2018-06-12 (×2): 100 mg via ORAL
  Filled 2018-06-11 (×2): qty 1

## 2018-06-11 MED ORDER — PROPOFOL 10 MG/ML IV BOLUS
INTRAVENOUS | Status: DC | PRN
  Administered 2018-06-11: 30 mg via INTRAVENOUS
  Administered 2018-06-11: 140 mg via INTRAVENOUS

## 2018-06-11 MED ORDER — FENTANYL CITRATE (PF) 100 MCG/2ML IJ SOLN: 25.0000 ug | INTRAMUSCULAR | Status: DC | PRN

## 2018-06-11 MED ORDER — FENTANYL CITRATE (PF) 250 MCG/5ML IJ SOLN
INTRAMUSCULAR | Status: DC | PRN
  Administered 2018-06-11: 100 ug via INTRAVENOUS

## 2018-06-11 MED ORDER — PANTOPRAZOLE SODIUM 20 MG PO TBEC
20.0000 mg | DELAYED_RELEASE_TABLET | Freq: Every evening | ORAL | Status: DC
  Administered 2018-06-11: 20 mg via ORAL
  Filled 2018-06-11: qty 1

## 2018-06-11 MED ORDER — LIDOCAINE 2% (20 MG/ML) 5 ML SYRINGE
INTRAMUSCULAR | Status: AC
  Filled 2018-06-11: qty 5

## 2018-06-11 MED ORDER — ROCURONIUM BROMIDE 50 MG/5ML IV SOSY
PREFILLED_SYRINGE | INTRAVENOUS | Status: AC
  Filled 2018-06-11: qty 10

## 2018-06-11 MED ORDER — PHENOL 1.4 % MT LIQD: 1.0000 | OROMUCOSAL | Status: DC | PRN

## 2018-06-11 MED ORDER — ONDANSETRON HCL 4 MG/2ML IJ SOLN: 4.0000 mg | Freq: Four times a day (QID) | INTRAMUSCULAR | Status: DC | PRN

## 2018-06-11 MED ORDER — ALBUMIN HUMAN 5 % IV SOLN
INTRAVENOUS | Status: DC | PRN
  Administered 2018-06-11: 14:00:00 via INTRAVENOUS

## 2018-06-11 MED ORDER — ROCURONIUM BROMIDE 50 MG/5ML IV SOSY
PREFILLED_SYRINGE | INTRAVENOUS | Status: AC
  Filled 2018-06-11: qty 5

## 2018-06-11 MED ORDER — MIDAZOLAM HCL 2 MG/2ML IJ SOLN
INTRAMUSCULAR | Status: AC
  Administered 2018-06-11: 2 mg via INTRAVENOUS
  Filled 2018-06-11: qty 2

## 2018-06-11 MED ORDER — ACETAMINOPHEN 500 MG PO TABS
500.0000 mg | ORAL_TABLET | Freq: Four times a day (QID) | ORAL | Status: DC
  Administered 2018-06-11 – 2018-06-12 (×3): 500 mg via ORAL
  Filled 2018-06-11 (×3): qty 1

## 2018-06-11 MED ORDER — CHLORHEXIDINE GLUCONATE 4 % EX LIQD: 60.0000 mL | Freq: Once | CUTANEOUS | Status: DC

## 2018-06-11 MED ORDER — ACETAMINOPHEN 325 MG PO TABS: 325.0000 mg | ORAL_TABLET | Freq: Four times a day (QID) | ORAL | Status: DC | PRN

## 2018-06-11 MED ORDER — MORPHINE SULFATE (PF) 2 MG/ML IV SOLN: 0.5000 mg | INTRAVENOUS | Status: DC | PRN

## 2018-06-11 MED ORDER — FENTANYL CITRATE (PF) 250 MCG/5ML IJ SOLN
INTRAMUSCULAR | Status: AC
  Filled 2018-06-11: qty 5

## 2018-06-11 MED ORDER — AMLODIPINE BESYLATE 5 MG PO TABS
10.0000 mg | ORAL_TABLET | Freq: Every day | ORAL | Status: DC
  Administered 2018-06-11: 10 mg via ORAL
  Filled 2018-06-11: qty 2

## 2018-06-11 MED ORDER — ONDANSETRON HCL 4 MG/2ML IJ SOLN
INTRAMUSCULAR | Status: DC | PRN
  Administered 2018-06-11: 4 mg via INTRAVENOUS

## 2018-06-11 MED ORDER — METHOCARBAMOL 500 MG PO TABS: 500.0000 mg | ORAL_TABLET | Freq: Four times a day (QID) | ORAL | Status: DC | PRN

## 2018-06-11 MED ORDER — FENTANYL CITRATE (PF) 100 MCG/2ML IJ SOLN
INTRAMUSCULAR | Status: AC
  Administered 2018-06-11: 50 ug via INTRAVENOUS
  Filled 2018-06-11: qty 2

## 2018-06-11 MED ORDER — HYDROCODONE-ACETAMINOPHEN 5-325 MG PO TABS: 1.0000 | ORAL_TABLET | ORAL | Status: DC | PRN

## 2018-06-11 MED ORDER — ONDANSETRON HCL 4 MG PO TABS: 4.0000 mg | ORAL_TABLET | Freq: Four times a day (QID) | ORAL | Status: DC | PRN

## 2018-06-11 MED ORDER — METOCLOPRAMIDE HCL 5 MG/ML IJ SOLN: 5.0000 mg | Freq: Three times a day (TID) | INTRAMUSCULAR | Status: DC | PRN

## 2018-06-11 MED ORDER — MIDAZOLAM HCL 2 MG/2ML IJ SOLN
2.0000 mg | Freq: Once | INTRAMUSCULAR | Status: AC
  Administered 2018-06-11: 2 mg via INTRAVENOUS

## 2018-06-11 MED ORDER — FLUTICASONE PROPIONATE 50 MCG/ACT NA SUSP
2.0000 | Freq: Every day | NASAL | Status: DC | PRN
  Filled 2018-06-11: qty 16

## 2018-06-11 SURGICAL SUPPLY — 76 items
ALCOHOL 70% 16 OZ (MISCELLANEOUS) ×2 IMPLANT
BEARING HUMERAL 40 STD VITE (Joint) ×2 IMPLANT
BIT DRILL 2.7 W/STOP DISP (BIT) ×2 IMPLANT
BIT DRILL 5/64X5 DISP (BIT) ×2 IMPLANT
BIT DRILL TWIST 2.7 (BIT) ×2 IMPLANT
BLADE SAG 18X100X1.27 (BLADE) ×2 IMPLANT
COMP REV AUG LG W/TAPER/GLENOI (Joint) ×2 IMPLANT
COMPONENT RV AUG LG W/TAPR/GLN (Joint) ×1 IMPLANT
COVER SURGICAL LIGHT HANDLE (MISCELLANEOUS) ×2 IMPLANT
COVER WAND RF STERILE (DRAPES) ×2 IMPLANT
DIAL VERSA SHOULDER 40 STD (Joint) ×2 IMPLANT
DRAPE HALF SHEET 40X57 (DRAPES) ×2 IMPLANT
DRAPE IMP U-DRAPE 54X76 (DRAPES) ×4 IMPLANT
DRAPE INCISE IOBAN 66X45 STRL (DRAPES) ×2 IMPLANT
DRAPE ORTHO SPLIT 77X108 STRL (DRAPES) ×2
DRAPE SURG 17X23 STRL (DRAPES) ×2 IMPLANT
DRAPE SURG ORHT 6 SPLT 77X108 (DRAPES) ×2 IMPLANT
DRAPE U-SHAPE 47X51 STRL (DRAPES) ×2 IMPLANT
DRSG ADAPTIC 3X8 NADH LF (GAUZE/BANDAGES/DRESSINGS) ×2 IMPLANT
DRSG AQUACEL AG ADV 3.5X10 (GAUZE/BANDAGES/DRESSINGS) ×2 IMPLANT
DURAPREP 26ML APPLICATOR (WOUND CARE) ×2 IMPLANT
ELECT BLADE 4.0 EZ CLEAN MEGAD (MISCELLANEOUS) ×2
ELECT REM PT RETURN 9FT ADLT (ELECTROSURGICAL) ×2
ELECTRODE BLDE 4.0 EZ CLN MEGD (MISCELLANEOUS) ×1 IMPLANT
ELECTRODE REM PT RTRN 9FT ADLT (ELECTROSURGICAL) ×1 IMPLANT
GAUZE SPONGE 4X4 12PLY STRL LF (GAUZE/BANDAGES/DRESSINGS) ×2 IMPLANT
GLOVE BIO SURGEON STRL SZ7.5 (GLOVE) ×2 IMPLANT
GLOVE BIOGEL PI IND STRL 8 (GLOVE) ×1 IMPLANT
GLOVE BIOGEL PI INDICATOR 8 (GLOVE) ×1
GOWN STRL REUS W/ TWL LRG LVL3 (GOWN DISPOSABLE) ×1 IMPLANT
GOWN STRL REUS W/ TWL XL LVL3 (GOWN DISPOSABLE) ×1 IMPLANT
GOWN STRL REUS W/TWL LRG LVL3 (GOWN DISPOSABLE) ×1
GOWN STRL REUS W/TWL XL LVL3 (GOWN DISPOSABLE) ×1
KIT BASIN OR (CUSTOM PROCEDURE TRAY) ×2 IMPLANT
KIT TURNOVER KIT B (KITS) ×2 IMPLANT
MANIFOLD NEPTUNE II (INSTRUMENTS) ×2 IMPLANT
NEEDLE 1/2 CIR MAYO (NEEDLE) ×2 IMPLANT
NEEDLE HYPO 25GX1X1/2 BEV (NEEDLE) ×2 IMPLANT
NEEDLE TAPERED W/ NITINOL LOOP (MISCELLANEOUS) ×2 IMPLANT
NS IRRIG 1000ML POUR BTL (IV SOLUTION) ×2 IMPLANT
PACK SHOULDER (CUSTOM PROCEDURE TRAY) ×2 IMPLANT
PAD ABD 8X10 STRL (GAUZE/BANDAGES/DRESSINGS) ×4 IMPLANT
PAD ARMBOARD 7.5X6 YLW CONV (MISCELLANEOUS) ×4 IMPLANT
PIN THREADED REVERSE (PIN) ×2 IMPLANT
REAMER GUIDE BUSHING SURG DISP (MISCELLANEOUS) ×2 IMPLANT
REAMER GUIDE W/SCREW AUG (MISCELLANEOUS) ×2 IMPLANT
RESTRAINT HEAD UNIVERSAL NS (MISCELLANEOUS) ×2 IMPLANT
SCREW BONE STRL 6.5MMX30MM (Screw) ×2 IMPLANT
SCREW LOCKING 4.75MMX15MM (Screw) ×4 IMPLANT
SCREW LOCKING STRL 4.75X25X3.5 (Screw) ×4 IMPLANT
SLING ARM IMMOBILIZER LRG (SOFTGOODS) IMPLANT
SLING ARM IMMOBILIZER MED (SOFTGOODS) IMPLANT
SPONGE LAP 18X18 X RAY DECT (DISPOSABLE) IMPLANT
SPONGE LAP 4X18 RFD (DISPOSABLE) ×2 IMPLANT
STAPLER VISISTAT 35W (STAPLE) ×2 IMPLANT
STEM HUMERAL STRL 13MMX83MM (Stem) ×2 IMPLANT
STRIP CLOSURE SKIN 1/2X4 (GAUZE/BANDAGES/DRESSINGS) ×2 IMPLANT
SUCTION FRAZIER HANDLE 10FR (MISCELLANEOUS) ×1
SUCTION TUBE FRAZIER 10FR DISP (MISCELLANEOUS) ×1 IMPLANT
SUT BROADBAND TAPE 2PK 2.3 (SUTURE) ×2 IMPLANT
SUT FIBERWIRE #2 38 T-5 BLUE (SUTURE) ×2
SUT MNCRL AB 4-0 PS2 18 (SUTURE) ×2 IMPLANT
SUT MON AB 2-0 CT1 36 (SUTURE) ×4 IMPLANT
SUT VIC AB 1 CT1 27 (SUTURE)
SUT VIC AB 1 CT1 27XBRD ANBCTR (SUTURE) IMPLANT
SUT VIC AB 2-0 CT1 27 (SUTURE) ×1
SUT VIC AB 2-0 CT1 TAPERPNT 27 (SUTURE) ×1 IMPLANT
SUTURE FIBERWR #2 38 T-5 BLUE (SUTURE) ×1 IMPLANT
SYR CONTROL 10ML LL (SYRINGE) ×2 IMPLANT
TAPE CLOTH SURG 6X10 WHT LF (GAUZE/BANDAGES/DRESSINGS) ×2 IMPLANT
TOWEL OR 17X24 6PK STRL BLUE (TOWEL DISPOSABLE) ×2 IMPLANT
TOWEL OR 17X26 10 PK STRL BLUE (TOWEL DISPOSABLE) ×2 IMPLANT
TOWER CARTRIDGE SMART MIX (DISPOSABLE) IMPLANT
TRAY HUM MINI SHOULDER +0 40D (Shoulder) ×2 IMPLANT
WATER STERILE IRR 1000ML POUR (IV SOLUTION) ×2 IMPLANT
YANKAUER SUCT BULB TIP NO VENT (SUCTIONS) ×2 IMPLANT

## 2018-06-11 NOTE — Anesthesia Preprocedure Evaluation (Signed)
Anesthesia Evaluation  Patient identified by MRN, date of birth, ID band Patient awake    Reviewed: Allergy & Precautions, NPO status , Patient's Chart, lab work & pertinent test results  History of Anesthesia Complications Negative for: history of anesthetic complications  Airway Mallampati: II  TM Distance: >3 FB Neck ROM: Full    Dental  (+) Teeth Intact, Dental Advisory Given   Pulmonary neg pulmonary ROS,    breath sounds clear to auscultation       Cardiovascular hypertension, Pt. on medications (-) angina(-) Past MI and (-) CHF  Rhythm:Regular     Neuro/Psych  Headaches, PSYCHIATRIC DISORDERS Anxiety Depression  Neuromuscular disease    GI/Hepatic Neg liver ROS, GERD  Medicated and Controlled,  Endo/Other  negative endocrine ROS  Renal/GU negative Renal ROS     Musculoskeletal  (+) Arthritis ,   Abdominal   Peds  Hematology negative hematology ROS (+)   Anesthesia Other Findings   Reproductive/Obstetrics                             Anesthesia Physical Anesthesia Plan  ASA: II  Anesthesia Plan: General and Regional   Post-op Pain Management:  Regional for Post-op pain   Induction: Intravenous  PONV Risk Score and Plan: 2 and Ondansetron and Dexamethasone  Airway Management Planned: Oral ETT  Additional Equipment: None  Intra-op Plan:   Post-operative Plan: Extubation in OR  Informed Consent: I have reviewed the patients History and Physical, chart, labs and discussed the procedure including the risks, benefits and alternatives for the proposed anesthesia with the patient or authorized representative who has indicated his/her understanding and acceptance.   Dental advisory given  Plan Discussed with: CRNA and Surgeon  Anesthesia Plan Comments:         Anesthesia Quick Evaluation

## 2018-06-11 NOTE — Op Note (Signed)
06/11/2018  4:21 PM  PATIENT:  Geoffrey West    PRE-OPERATIVE DIAGNOSIS:  Left shoulder infected prosthesis  POST-OPERATIVE DIAGNOSIS:  Same  PROCEDURE:  1. Explant of humeral antibiotic spacer 2.  REVERSE TOTAL SHOULDER REVISION all components  SURGEON:  [name], MD  ASSISTANT: April Green, RNFA  ANESTHESIA:   General  ESTIMATED BLOOD LOSS: 200cc  PREOPERATIVE INDICATIONS:  Geoffrey West is a  62 y.o. male with a diagnosis of Left shoulder infected prosthesis.  He was diagnosed with infected reverse shoulder arthroplasty post procedure earlier this year.  He has since been treated with explant and antibiotic spacer placement and then subsequent antibiotic spacer placement with concomitant glenoid bone grafting. He presents today for conversion of the antibiotic spacer to reverse shoulder arthroplasty.  The risks benefits and alternatives were discussed with the patient preoperatively including but not limited to the risks of infection, bleeding, nerve injury, cardiopulmonary complications, the need for revision surgery, dislocation, brachial plexus palsy, incomplete relief of pain, among others, and the patient was willing to proceed.  OPERATIVE IMPLANTS: Biomet size 73mm standard humeral stem press-fit standard with a 40 mm reverse shoulder arthroplasty tray with a standard liner and a 40 mm glenosphere + 0 with a Large augmented baseplate and 4 locking screws and one central nonlocking screw.  OPERATIVE FINDINGS:  On the approach there was abundant callus formation noted along the lateral aspect of the humerus.  This was from the previous osteotomy performed for humeral stem explant.  This was very stable.  This was now united however.  The greater tuberosity piece had been pulled posteriorly.  The approach did not demonstrate any signs concerning for infectious material or process.  On the glenoid side but the previous bone graft procedure had been successful with  abundant new bone noted in the previously noted cavitary defects.  There was superior bone wear and anterior bone wear.  OPERATIVE PROCEDURE: The patient was brought to the operating room and placed in the supine position. General anesthesia was administered. IV antibiotics were given. A Foley was noit placed. Time out was performed. The upper extremity was prepped and draped in usual sterile fashion. The patient was in a beachchair position. Deltopectoral approach was carried out.  Dissection was carried down through previous scar tissue utilizing previously placed deltopectoral FiberWire sutures as points of reference.  The coracoid process served as the proximal aspect of this approach.  We then encountered pseudocapsule that was opened.  Sterile appearing serous fluid was encountered at this level.\  I then performed circumferential releases of the humerus, and then dislocated the head.  We then continued along with subdeltoid releases where there was abundant fibrotic adhesion from multiple previous surgeries.  Deep retractors were then placed.  Next utilizing osteotomes were able to explant the previously placed antibiotic impregnated cement spacer.  We then rongeured and curetted the fibrinous material that was surrounding this previously placed spacer.  Again at this juncture we noted that the previous greater tuberosity osteotomy was well-healed however in a malunited position.  This was very stable and there was abundant callus noted at the metaphysis.   Next we moved to prepared the humerus.  We used hand reamers and prepared up to a 14 mm.  However, at that size we were unable to seat the stem to the appropriate level using the fracture stem.  We then made the determination to use the standard stem and achieve metaphyseal fit.  This was appropriately fitted in  30 degrees retroversion with a 13 mm stem.  Deep retractors were placed along the glenoid.  At this point we were able to determine  that the previously bone grafted cavitary defects in the glenoid had nicely healed.  There was solid bone noted throughout.  We then placed a guidepin in the center position of the glenoid face.  At this juncture we noted anterior and superior bone wear.  We determined that the augmented baseplate would be most appropriate for this defect and we utilized the large sized wedge augment.  We then prepared to the glenoid with a center drill and reamers as directed by the manufacturer's guide.  We got a blush of bone superiorly as well as inferiorly.  The final implant was then impacted into place and had good press-fit.  We then placed a 30 mm central nonlocking screw in compression mode that has good fit.  Next we placed 4 peripheral locking screws.  Glenosphere had good fit and was not noted to be loose on direct manipulation.  I then turned my attention to the glenosphere, and impacted this into place, placing slight inferior offset (set on B).  We chose the 40 mm with no lateralization glenosphere  The glenoid sphere was completely seated, and had engagement of the [facility] taper. I then turned my attention back to the humerus.  I sequentially broached, and then trialed, and was found to restore soft tissue tension, and it had 2 finger tightness. Therefore the above named components were selected. The shoulder felt stable throughout functional motion.  Before I placed the real prosthesis I placed a fiber tape style suture through the posterior cuff.  This was then placed around the calcar and tied back to the humeral stem to help neutralize forces on the posterior cuff..  I then impacted the real prosthesis into place, as well as the real humeral tray, and reduced the shoulder. The shoulder had excellent motion, and was stable, and I irrigated the wounds copiously.   We did also perform a lavage of the wound with hydrogen peroxide.  We then once again copiously irrigated with normal saline.   The  deltopectoral interval was closed with interrupted FiberWire sutures from marking sutures.  The subcutaneous tissue was closed with 2-0 Monocryl.  The skin was reapproximated with staples.  Standard standard sterile dressing was applied.  All counts were correct x2.  There were no intraoperative complications noted.  Patient was transported to PACU in stable condition.  Disposition: Geoffrey West will be admitted to the orthopedic floor for routine postoperative occupational and physical therapy.  He will be nonweightbearing other than up to 2 pounds with the left arm.  He will maintain sling other than when doing activities of daily living or therapy.  He will take aspirin once daily for 6 weeks for DVT prophylaxis.

## 2018-06-11 NOTE — Transfer of Care (Signed)
Immediate Anesthesia Transfer of Care Note  Patient: Geoffrey West [facility]  Procedure(s) Performed: REVERSE TOTAL SHOULDER REVISION (Left Shoulder)  Patient Location: PACU  Anesthesia Type:GA combined with regional for post-op pain  Level of Consciousness: awake, alert  and oriented  Airway & Oxygen Therapy: Patient Spontanous Breathing and Patient connected to nasal cannula oxygen  Post-op Assessment: Report given to RN and Post -op Vital signs reviewed and stable  Post vital signs: Reviewed and stable  Last Vitals:  Vitals Value Taken Time  BP 106/70 06/11/2018  3:07 PM  Temp    Pulse 94 06/11/2018  3:11 PM  Resp 17 06/11/2018  3:11 PM  SpO2 94 % 06/11/2018  3:11 PM  Vitals shown include unvalidated device data.  Last Pain:  Vitals:   06/11/18 1225  TempSrc:   PainSc: 0-No pain         Complications: No apparent anesthesia complications

## 2018-06-11 NOTE — Anesthesia Procedure Notes (Signed)
Procedure Name: Intubation Date/Time: 06/11/2018 12:44 PM Performed by: Renato Shin, CRNA Pre-anesthesia Checklist: Patient identified, Emergency Drugs available, Suction available and Patient being monitored Patient Re-evaluated:Patient Re-evaluated prior to induction Oxygen Delivery Method: Circle system utilized Preoxygenation: Pre-oxygenation with 100% oxygen Induction Type: IV induction Ventilation: Mask ventilation without difficulty Laryngoscope Size: Miller and 3 Grade View: Grade I Tube type: Oral Tube size: 7.5 mm Number of attempts: 1 Airway Equipment and Method: Stylet Placement Confirmation: ETT inserted through vocal cords under direct vision,  positive ETCO2 and breath sounds checked- equal and bilateral Secured at: 23 cm Tube secured with: Tape Dental Injury: Teeth and Oropharynx as per pre-operative assessment

## 2018-06-11 NOTE — H&P (Signed)
ORTHOPAEDIC H and P  REQUESTING PHYSICIAN: [name], MD  PCP:  Orpah Melter, MD  Chief Complaint: Left shoulder prosthetic infection  HPI: Geoffrey West is a 62 y.o. male who complains of left shoulder pain and chronic left shoulder replacement infection.  He has had a staged explantation with antibiotic spacer placement.  He is here today for reimplantation of reverse shoulder arthroplasty.  We have previously discussed this procedure at length in the office.  He has no new complaints at this time.  He denies fevers, night sweats or chills.  Past Medical History:  Diagnosis Date  . Anxiety   . Arthritis    "all over" (03/06/2018)  . GERD (gastroesophageal reflux disease)   . Gout    "on daily RX" (03/06/2018)  . Hypertension   . Hypertension 12/25/2016  . Low testosterone 12/25/2016  . Migraine    "haven't had more than 1/year since propanolol started" (03/06/2018)  . 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 Situational depression    "recently lost my mom and brother" (03/06/2018)   Past Surgical History:  Procedure Laterality Date  . BACK SURGERY    . COLONOSCOPY    . COLONOSCOPY W/ BIOPSIES AND POLYPECTOMY    . EXCISIONAL TOTAL SHOULDER ARTHROPLASTY WITH ANTIBIOTIC SPACER Left 03/06/2018   Procedure: Left shoulder antibiotic spacer expalnt with glenoid bone grafting, antiobiotic spacer placement;  Surgeon: [name], MD;  Location: [place];  Service: Orthopedics;  Laterality: Left;  . IR FLUORO GUIDE CV LINE RIGHT  12/26/2016  . JOINT REPLACEMENT    . KNEE ARTHROSCOPY Bilateral    X 10 total arthroscopies  . KNEE CARTILAGE SURGERY Left 1977   "cartilage removed"  . LASIK Bilateral   . LUMBAR DISC SURGERY  2000s   "ruptured disc"  . REVERSE SHOULDER ARTHROPLASTY Left 12/08/2016   Procedure: Revision left total shoulder to reverse total shoulder arthroplasty;  Surgeon: [name], MD;  Location: [place];  Service: Orthopedics;   Laterality: Left;  . REVERSE SHOULDER ARTHROPLASTY Left 01/01/2018   Procedure: Left reverse shouler explant with antibiotic spacer placement;  Surgeon: [name], MD;  Location: [facility];  Service: Orthopedics;  Laterality: Left;  2.5 hrs  . SHOULDER ARTHROSCOPY Right 1980s/1990s   "bone spur removed"  . SHOULDER OPEN ROTATOR CUFF REPAIR Right 1970s  . SHOULDER SURGERY Left 03/06/2018   antibiotic spacer expalnt with glenoid bone grafting, antiobiotic spacer placement  . TOTAL KNEE ARTHROPLASTY Bilateral   . TOTAL SHOULDER ARTHROPLASTY Left ~ 2014-2017 X 2   "in [place]"  . TOTAL SHOULDER REPLACEMENT Right ~ 2009   "in [place]"  . WISDOM TOOTH EXTRACTION     Social History   Socioeconomic History  . Marital status: Single    Spouse name: Not on file  . Number of children: Not on file  . Years of education: Not on file  . Highest education level: Not on file  Occupational History  . Not on file  Social Needs  . Financial resource strain: Not on file  . Food insecurity:    Worry: Not on file    Inability: Not on file  . Transportation needs:    Medical: Not on file    Non-medical: Not on file  Tobacco Use  . Smoking status: Never Smoker  . Smokeless tobacco: Never Used  Substance and Sexual Activity  . Alcohol use: Not Currently    Comment: 03/06/2018 "max of 2 drinks/year; if that"  .  Drug use: Never  . Sexual activity: Not Currently  Lifestyle  . Physical activity:    Days per week: Not on file    Minutes per session: Not on file  . Stress: Not on file  Relationships  . Social connections:    Talks on phone: Not on file    Gets together: Not on file    Attends religious service: Not on file    Active member of club or organization: Not on file    Attends meetings of clubs or organizations: Not on file    Relationship status: Not on file  Other Topics Concern  . Not on file  Social History Narrative  . Not on file   Family History  Problem  Relation Age of Onset  . Arthritis Mother   . Arthritis Sister   . Alzheimer's disease Brother   . Arthritis Brother    Allergies  Allergen Reactions  . Fentanyl Shortness Of Breath and Other (See Comments)    Reaction to [facility] ONLY > ? DOSE REGULATION ?   [name] Oxycodone Other (See Comments)    Pt states he goes through hot and cold flashes withdrawal like symptoms   Prior to Admission medications   Medication Sig Start Date End Date Taking? Authorizing Provider  allopurinol (ZYLOPRIM) 300 MG tablet Take 300 mg by mouth daily.   Yes [provider]  amLODipine (NORVASC) 10 MG tablet Take 10 mg by mouth at bedtime.    Yes [provider]  celecoxib (CELEBREX) 200 MG capsule Take 200 mg by mouth 2 (two) times daily. 11/06/16  Yes [provider]  Coenzyme Q10 (COQ-10) 200 MG CAPS Take 200 mg by mouth daily.   Yes [provider]  fluticasone (FLONASE) 50 MCG/ACT nasal spray Place 2 sprays into both nostrils daily as needed for allergies.  11/24/16  Yes [provider]  ibuprofen (ADVIL,MOTRIN) 200 MG tablet Take 600 mg by mouth daily as needed for headache or moderate pain.    Yes [provider]  Magnesium 400 MG TABS Take 400 mg by mouth daily.   Yes [provider]  Multiple Vitamin (MULTIVITAMIN WITH MINERALS) TABS tablet Take 1 tablet by mouth daily.   Yes [provider]  pantoprazole (PROTONIX) 20 MG tablet Take 20 mg by mouth every evening.   Yes [provider]  propranolol ER (INDERAL LA) 80 MG 24 hr capsule Take 80 mg by mouth every evening.  02/01/18  Yes [provider]  ranitidine (ZANTAC) 150 MG tablet Take 150 mg by mouth daily as needed for heartburn.   Yes [provider]  testosterone cypionate (DEPOTESTOSTERONE CYPIONATE) 200 MG/ML injection Inject 100 mg into the muscle every Wednesday.    Yes [provider]  traMADol (ULTRAM) 50 MG tablet Take 1 tablet (50 mg total) by  mouth every 6 (six) hours as needed for moderate pain. 01/03/18  Yes [name], MD  clonazePAM (KLONOPIN) 1 MG tablet Take 1 mg by mouth daily as needed for anxiety.    [provider]   No results found.  Positive ROS: All other systems have been reviewed and were otherwise negative with the exception of those mentioned in the HPI and as above.  Physical Exam: General: Alert, no acute distress Cardiovascular: No pedal edema Respiratory: No cyanosis, no use of accessory musculature GI: No organomegaly, abdomen is soft and non-tender Skin: No lesions in the area of chief complaint Neurologic: Sensation intact distally Psychiatric: Patient  is competent for consent with normal mood and affect Lymphatic: No axillary or cervical lymphadenopathy    Assessment: 1.  Retained left shoulder antibiotic spacer for infection  Plan: -Plan today for revision left total shoulder arthroplasty to a reverse shoulder arthroplasty. -We have again reviewed the risks, benefits, and indications of this procedure including but not limited to bleeding, infection, damage to surrounding neurovascular structures, development of pseudoparesis, dislocation, need for further surgery, fracture, dislocation, and the risk of anesthesia.  He has provided informed consent to proceed. -We will plan for admission postoperatively to the orthopedics floor.  He will have routine postoperative care.    [name], MD Cell [phone]    06/11/2018 12:08 PM

## 2018-06-11 NOTE — Progress Notes (Signed)
Dr. Ermalene Postin made aware of pt's BP, no new orders at this time. Pt did take normal home BP meds last evening.  Also, pt states allergy to Fentanyl patch only--tolerates IV Fentanyl.

## 2018-06-11 NOTE — Brief Op Note (Signed)
06/11/2018  3:17 PM  PATIENT:  Lynden Ang  62 y.o. male  PRE-OPERATIVE DIAGNOSIS:  Left shoulder infected prosthesis  POST-OPERATIVE DIAGNOSIS:  Left Shoulder Infected prosthesis   PROCEDURE:  Procedure(s): REVERSE TOTAL SHOULDER REVISION (Left)  SURGEON:  Surgeon(s) and Role:    * [name], MD - Primary  PHYSICIAN ASSISTANT:   ASSISTANTS: April Green, RNFA   ANESTHESIA:   regional and general  EBL:  200 mL   BLOOD ADMINISTERED:none  DRAINS: none   LOCAL MEDICATIONS USED:  NONE  SPECIMEN:  No Specimen  DISPOSITION OF SPECIMEN:  N/A  COUNTS:  YES  TOURNIQUET:  * No tourniquets in log *  DICTATION: .Note written in EPIC  PLAN OF CARE: Admit to inpatient   PATIENT DISPOSITION:  PACU - hemodynamically stable.   Delay start of Pharmacological VTE agent (>24hrs) due to surgical blood loss or risk of bleeding: not applicable

## 2018-06-12 ENCOUNTER — Encounter (HOSPITAL_COMMUNITY): Payer: Self-pay | Admitting: Orthopedic Surgery

## 2018-06-12 LAB — CBC
HCT: 44.5 % (ref 39.0–52.0)
Hemoglobin: 15.6 g/dL (ref 13.0–17.0)
MCH: 33.1 pg (ref 26.0–34.0)
MCHC: 35.1 g/dL (ref 30.0–36.0)
MCV: 94.3 fL (ref 80.0–100.0)
Platelets: 163 10*3/uL (ref 150–400)
RBC: 4.72 MIL/uL (ref 4.22–5.81)
RDW: 12.5 % (ref 11.5–15.5)
WBC: 13.9 10*3/uL — ABNORMAL HIGH (ref 4.0–10.5)
nRBC: 0 % (ref 0.0–0.2)

## 2018-06-12 LAB — BASIC METABOLIC PANEL
Anion gap: 12 (ref 5–15)
BUN: 25 mg/dL — ABNORMAL HIGH (ref 8–23)
CO2: 25 mmol/L (ref 22–32)
Calcium: 8.9 mg/dL (ref 8.9–10.3)
Chloride: 99 mmol/L (ref 98–111)
Creatinine, Ser: 1.71 mg/dL — ABNORMAL HIGH (ref 0.61–1.24)
GFR calc Af Amer: 49 mL/min — ABNORMAL LOW (ref >60)
GFR calc non Af Amer: 42 mL/min — ABNORMAL LOW (ref >60)
Glucose, Bld: 184 mg/dL — ABNORMAL HIGH (ref 70–99)
Potassium: 3.7 mmol/L (ref 3.5–5.1)
Sodium: 136 mmol/L (ref 135–145)

## 2018-06-12 MED ORDER — ONDANSETRON 4 MG PO TBDP: 4.0000 mg | ORAL_TABLET | Freq: Three times a day (TID) | ORAL | 0 refills | Status: DC | PRN

## 2018-06-12 MED ORDER — TRAMADOL HCL 50 MG PO TABS: 50.0000 mg | ORAL_TABLET | Freq: Four times a day (QID) | ORAL | 0 refills | Status: DC | PRN

## 2018-06-12 NOTE — Progress Notes (Signed)
   Subjective:  Patient reports pain as mild.  He denies chest pain, shortness of breath, nausea or vomiting.  Block is slowly wearing off.  Objective:   VITALS:   Vitals:   06/11/18 2336 06/12/18 0351 06/12/18 0755 06/12/18 1214  BP: (!) 150/96 122/83 (!) 136/94 134/84  Pulse: 92 76 72 67  Resp: 20 20 18 18   Temp: 98.2 F (36.8 C) 97.7 F (36.5 C)  98.3 F (36.8 C)  TempSrc: Oral Oral  Oral  SpO2: 95% 94% 99% 98%  Weight:      Height:        Neurologically intact Neurovascular intact Sensation intact distally Intact pulses distally Incision: dressing change to Aquasol   Lab Results  Component Value Date   WBC 13.9 (H) 06/12/2018   HGB 15.6 06/12/2018   HCT 44.5 06/12/2018   MCV 94.3 06/12/2018   PLT 163 06/12/2018   BMET    Component Value Date/Time   NA 136 06/12/2018 0531   K 3.7 06/12/2018 0531   CL 99 06/12/2018 0531   CO2 25 06/12/2018 0531   GLUCOSE 184 (H) 06/12/2018 0531   BUN 25 (H) 06/12/2018 0531   CREATININE 1.71 (H) 06/12/2018 0531   CREATININE 1.25 03/01/2017 1636   CALCIUM 8.9 06/12/2018 0531   GFRNONAA 42 (L) 06/12/2018 0531   GFRNONAA 62 03/01/2017 1636   GFRAA 49 (L) 06/12/2018 0531   GFRAA 72 03/01/2017 1636     Assessment/Plan: 1 Day Post-Op   Principal Problem:   S/P reverse total shoulder arthroplasty, left Active Problems:   History of reverse total replacement of left shoulder joint   Advance diet Up with therapy - Likely discharge home later today following resolution of nerve block. - Nonweightbearing to left upper extremity but okay for up to 2 pounds. - Maintained sling unless doing exercises or ADLs. - Discharge home with follow-up in 2 weeks for wound check.   [name] 06/12/2018, 12:21 PM   Geralynn Rile, MD [phone]

## 2018-06-12 NOTE — Discharge Instructions (Signed)
Maintain sling at all times unless doing exercises or activities of daily living.   - Maintain postoperative bandage until follow-up appointment he may shower with this bandage in place.  Do not submerge underwater. - Apply ice to the left shoulder for 20-30 minutes at a time each hour that you are awake. - 4 mild to moderate pain use Tylenol and/or Celebrex. - For breakthrough pain use tramadol as directed. - Return to see Dr. [name] in 2 weeks.

## 2018-06-12 NOTE — Evaluation (Signed)
Occupational Therapy Evaluation Patient Details Name: Geoffrey West MRN: [id number] DOB: 1956/02/24 Today's Date: 06/12/2018    History of Present Illness Geoffrey West is a  62 y.o. male with a diagnosis of Left shoulder infected prosthesis. S/p left reverse total shoulder. PMH including Anxiety, Arthritis, GERD, Gout, Hypertension, Hypertension, Low testosterone, Migraine, left shoulder replacement and prosthetic shoulder infection, and Situational depression.   Clinical Impression   PTA, pt was living alone and was independent. Pt currently requiring Min A for UB ADLs and supervision for LB ADLs and functional mobility. Providing education and handout on shoulder precautions, exercises, sling management and positioning, compensatory techniques for UB ADLs, and sleep positioning. Answered all pt questions and reviewed all information. Recommend dc home once medically stable per physician. All acute OT needs met and will sign off.     Follow Up Recommendations  Follow surgeon's recommendation for DC plan and follow-up therapies;Supervision - Intermittent    Equipment Recommendations  None recommended by OT    Recommendations for Other Services       Precautions / Restrictions Precautions Precautions: Shoulder Type of Shoulder Precautions: WFL ROM for hand, wrist, and elbow. FF 0-90. ER 0-30. No shoulder abduction. No lifting more than 2 lbs. Shoulder Interventions: Shoulder sling/immobilizer;Off for dressing/bathing/exercises Precaution Booklet Issued: Yes (comment) Precaution Comments: Provided education and handout on shoulder protocal, exercises, ROM limitations, and compensatory techniques for ADLs.  Required Braces or Orthoses: Sling Restrictions Weight Bearing Restrictions: Yes LUE Weight Bearing: Non weight bearing Other Position/Activity Restrictions: No lifting more that 2lbs      Mobility Bed Mobility Overal bed mobility: Independent                 Transfers Overall transfer level: Independent                    Balance Overall balance assessment: No apparent balance deficits (not formally assessed)                                         ADL either performed or assessed with clinical judgement   ADL Overall ADL's : Needs assistance/impaired Eating/Feeding: Independent;Sitting   Grooming: Supervision/safety;Set up;Standing   Upper Body Bathing: Supervision/ safety;Set up;Sitting Upper Body Bathing Details (indicate cue type and reason): Educating pt on compensatory technique for UB bathing Lower Body Bathing: Supervison/ safety;Set up;Sit to/from stand   Upper Body Dressing : Minimal assistance;Sitting Upper Body Dressing Details (indicate cue type and reason): Educating pt on compensatory technique for UB dressing and sling management. Min A for adjusting left sleeve over shoulder and to position sling.  Lower Body Dressing: Supervision/safety;Set up   Toilet Transfer: Supervision/safety         Tub/Shower Transfer Details (indicate cue type and reason): Discussed use of shower seat for seated showers and provide  Functional mobility during ADLs: Supervision/safety General ADL Comments: Providing education and handout on shoulder precautions, compensatory techniques for ADLs, exercises, and sling management. Pt demonstrating understanding and performing ADLs with supervision-Min A.      Vision         Perception     Praxis      Pertinent Vitals/Pain Pain Assessment: Faces Faces Pain Scale: Hurts a little bit Pain Location: Left shoulder Pain Descriptors / Indicators: Constant;Grimacing Pain Intervention(s): Monitored during session;Limited activity within patient's tolerance;Repositioned     Hand Dominance Right   Extremity/Trunk  Assessment Upper Extremity Assessment Upper Extremity Assessment: LUE deficits/detail LUE Deficits / Details: s/p Left shoulder replacement.  LUE  Sensation: decreased light touch LUE Coordination: decreased gross motor   Lower Extremity Assessment Lower Extremity Assessment: Overall WFL for tasks assessed   Cervical / Trunk Assessment Cervical / Trunk Assessment: Normal   Communication Communication Communication: No difficulties   Cognition Arousal/Alertness: Awake/alert Behavior During Therapy: WFL for tasks assessed/performed Overall Cognitive Status: Within Functional Limits for tasks assessed                                     General Comments  Pt continues to reports numbness from nerve block    Exercises Exercises: Shoulder Shoulder Exercises Shoulder Flexion: PROM;Self ROM;Left;10 reps;Seated(0-90) Shoulder Extension: PROM;Self ROM;Left;10 reps;Seated(0-90) Shoulder External Rotation: PROM;Self ROM;Left;10 reps;Seated(0-30) Elbow Flexion: PROM;Self ROM;Left;10 reps;Seated Elbow Extension: PROM;Self ROM;Left;10 reps;Seated Wrist Flexion: AROM;Left;10 reps;Seated Wrist Extension: AROM;Left;10 reps;Seated Digit Composite Flexion: AROM;Left;10 reps;Seated Composite Extension: AROM;Both;10 reps;Seated Neck Flexion: AROM;Seated Neck Extension: AROM;Seated Neck Lateral Flexion - Right: AROM;Seated Neck Lateral Flexion - Left: AROM;Seated   Shoulder Instructions Shoulder Instructions Donning/doffing shirt without moving shoulder: Minimal assistance Method for sponge bathing under operated UE: Supervision/safety Donning/doffing sling/immobilizer: Supervision/safety Correct positioning of sling/immobilizer: Minimal assistance ROM for elbow, wrist and digits of operated UE: Supervision/safety Sling wearing schedule (on at all times/off for ADL's): Supervision/safety Proper positioning of operated UE when showering: Supervision/safety Positioning of UE while sleeping: Supervision/safety    Home Living Family/patient expects to be discharged to:: Private residence Living Arrangements:  Alone Available Help at Discharge: Family Type of Home: House Home Access: Stairs to enter     Home Layout: One level                   Additional Comments: Plans to get a shower seat      Prior Functioning/Environment Level of Independence: Independent        Comments: Retired Press photographer        OT Problem List: Decreased strength;Decreased range of motion;Decreased activity tolerance;Decreased knowledge of use of DME or AE;Decreased knowledge of precautions;Impaired UE functional use      OT Treatment/Interventions:      OT Goals(Current goals can be found in the care plan section) Acute Rehab OT Goals Patient Stated Goal: "Begin exercises" OT Goal Formulation: All assessment and education complete, DC therapy  OT Frequency:     Barriers to D/C:            Co-evaluation              AM-PAC OT "6 Clicks" Daily Activity     Outcome Measure Help from another person eating meals?: None Help from another person taking care of personal grooming?: None Help from another person toileting, which includes using toliet, bedpan, or urinal?: None Help from another person bathing (including washing, rinsing, drying)?: None Help from another person to put on and taking off regular upper body clothing?: A Little Help from another person to put on and taking off regular lower body clothing?: None 6 Click Score: 23   End of Session Equipment Utilized During Treatment: Other (comment)(sling) Nurse Communication: Mobility status;Precautions;Weight bearing status  Activity Tolerance: Patient tolerated treatment well Patient left: in bed;with call bell/phone within reach(sitting at EOB)  OT Visit Diagnosis: Unsteadiness on feet (R26.81);Other abnormalities of gait and mobility (R26.89);Muscle weakness (generalized) (M62.81)  Time: 3074-6002 OT Time Calculation (min): 17 min Charges:  OT General Charges $OT Visit: 1 Visit OT Evaluation $OT Eval  Moderate Complexity: [place], OTR/L Acute Rehab Pager: [phone] Office: [place] 06/12/2018, 9:33 AM

## 2018-06-12 NOTE — Progress Notes (Signed)
Pt doing well.  Pt given D/C instructions with Rx, verbal understanding was provided. Pt's incision is clean and dry with no sign of infection. Pt's IV was removed prior to D/C. Pt D/C'd home via walking @ 1325 per MD order. Pt is stable @ D/C and has no other needs at this time. Holli Humbles, RN

## 2018-06-12 NOTE — Anesthesia Postprocedure Evaluation (Signed)
Anesthesia Post Note  Patient: Geoffrey West [facility]  Procedure(s) Performed: REVERSE TOTAL SHOULDER REVISION (Left Shoulder)     Patient location during evaluation: PACU Anesthesia Type: Regional and General Level of consciousness: awake and alert Pain management: pain level controlled Vital Signs Assessment: post-procedure vital signs reviewed and stable Respiratory status: spontaneous breathing, nonlabored ventilation, respiratory function stable and patient connected to nasal cannula oxygen Cardiovascular status: blood pressure returned to baseline and stable Postop Assessment: no apparent nausea or vomiting Anesthetic complications: no    Last Vitals:  Vitals:   06/12/18 0755 06/12/18 1214  BP: (!) 136/94 134/84  Pulse: 72 67  Resp: 18 18  Temp:  36.8 C  SpO2: 99% 98%    Last Pain:  Vitals:   06/12/18 1214  TempSrc: Oral  PainSc:                  Prim Morace

## 2018-06-13 MED ORDER — BUPIVACAINE LIPOSOME 1.3 % IJ SUSP
INTRAMUSCULAR | Status: DC | PRN
  Administered 2018-06-11: 133 mg via PERINEURAL

## 2018-06-13 MED ORDER — BUPIVACAINE HCL (PF) 0.5 % IJ SOLN
INTRAMUSCULAR | Status: DC | PRN
  Administered 2018-06-11: 15 mL via PERINEURAL

## 2018-06-13 NOTE — Addendum Note (Signed)
Addendum  created 06/13/18 1709 by Oleta Mouse, MD   Child order released for a procedure order, Clinical Note Signed, Intraprocedure Blocks edited, Intraprocedure Meds edited

## 2018-06-13 NOTE — Anesthesia Procedure Notes (Signed)
Anesthesia Regional Block: Interscalene brachial plexus block   Pre-Anesthetic Checklist: ,, timeout performed, Correct Patient, Correct Site, Correct Laterality, Correct Procedure, Correct Position, site marked, Risks and benefits discussed,  Surgical consent,  Pre-op evaluation,  At surgeon's request and post-op pain management  Laterality: Left  Prep: chloraprep       Needles:  Injection technique: Single-shot     Needle Length: 5cm  Needle Gauge: 22     Additional Needles: Arrow StimuQuik ECHO Echogenic Stimulating PNB Needle  Procedures:,,,, ultrasound used (permanent image in chart),,,,  Narrative:  Start time: 06/11/2018 12:09 PM End time: 06/11/2018 12:14 PM Injection made incrementally with aspirations every 5 mL.  Performed by: Personally  Anesthesiologist: Oleta Mouse, MD

## 2018-06-14 NOTE — Discharge Summary (Signed)
Patient ID: Geoffrey West MRN: [id number] DOB/AGE: 03/16/1956 62 y.o.  Admit date: 06/11/2018 Discharge date: 06/12/2018  Primary Diagnosis: Left shoulder arthroplasty infection  Admission Diagnoses:  Past Medical History:  Diagnosis Date  . Anxiety   . Arthritis    "all over" (03/06/2018)  . GERD (gastroesophageal reflux disease)   . Gout    "on daily RX" (03/06/2018)  . Hypertension   . Hypertension 12/25/2016  . Low testosterone 12/25/2016  . Migraine    "haven't had more than 1/year since propanolol started" (03/06/2018)  . 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   Weight as of this encounter: 97.2 kg.  Procedure:  Procedure(s) (LRB): REVERSE TOTAL SHOULDER REVISION (Left)   Consults: None  HPI:  Tommy presented for revision arthroplasty of his left shoulder. Laboratory Data: Admission on 06/11/2018, Discharged on 06/12/2018  Component Date Value Ref Range Status  . WBC 06/12/2018 13.9* 4.0 - 10.5 K/uL Final  . RBC 06/12/2018 4.72  4.22 - 5.81 MIL/uL Final  . Hemoglobin 06/12/2018 15.6  13.0 - 17.0 g/dL Final  . HCT 06/12/2018 44.5  39.0 - 52.0 % Final  . MCV 06/12/2018 94.3  80.0 - 100.0 fL Final  . MCH 06/12/2018 33.1  26.0 - 34.0 pg Final  . MCHC 06/12/2018 35.1  30.0 - 36.0 g/dL Final  . RDW 06/12/2018 12.5  11.5 - 15.5 % Final  . Platelets 06/12/2018 163  150 - 400 K/uL Final  . nRBC 06/12/2018 0.0  0.0 - 0.2 % Final   Performed at [facility], [place] [address] [place], [place] [address]  . Sodium 06/12/2018 136  135 - 145 mmol/L Final  . Potassium 06/12/2018 3.7  3.5 - 5.1  mmol/L Final  . Chloride 06/12/2018 99  98 - 111 mmol/L Final  . CO2 06/12/2018 25  22 - 32 mmol/L Final  . Glucose, Bld 06/12/2018 184* 70 - 99 mg/dL Final  . BUN 06/12/2018 25* 8 - 23 mg/dL Final  . Creatinine, Ser 06/12/2018 1.71* 0.61 - 1.24 mg/dL Final  . Calcium 06/12/2018 8.9  8.9 - 10.3 mg/dL Final  . GFR calc non Af Amer 06/12/2018 42* >60 mL/min Final  . GFR calc Af Amer 06/12/2018 49* >60 mL/min Final  . Anion gap 06/12/2018 12  5 - 15 Final   Performed at [facility], [place] [address] [place], [place] [address]  Hospital Outpatient Visit on 06/04/2018  Component Date Value Ref Range Status  . MRSA, PCR 06/04/2018 NEGATIVE  NEGATIVE Final  . Staphylococcus aureus 06/04/2018 NEGATIVE  NEGATIVE Final   Comment: (NOTE) The Xpert SA Assay (FDA approved for NASAL specimens in patients 69 years of age and older), is one component of a comprehensive surveillance program. It is not intended to diagnose infection nor to guide or monitor treatment. Performed at [facility], [place] [address], [place], [place] [address]   . WBC 06/04/2018 5.1  4.0 - 10.5 K/uL Final  . RBC 06/04/2018 5.78  4.22 - 5.81 MIL/uL Final  . Hemoglobin 06/04/2018 18.3* 13.0 - 17.0 g/dL Final  . HCT  06/04/2018 55.6* 39.0 - 52.0 % Final  . MCV 06/04/2018 96.2  80.0 - 100.0 fL Final  . MCH 06/04/2018 31.7  26.0 - 34.0 pg Final  . MCHC 06/04/2018 32.9  30.0 - 36.0 g/dL Final  . RDW 06/04/2018 13.1  11.5 - 15.5 % Final  . Platelets 06/04/2018 177  150 - 400 K/uL Final  . nRBC 06/04/2018 0.0  0.0 - 0.2 % Final   Performed at [facility], [place] [address], [place], [place] [address]  . Sodium 06/04/2018 141  135 - 145 mmol/L Final  . Potassium 06/04/2018 3.9  3.5 - 5.1 mmol/L Final  . Chloride 06/04/2018 101  98 - 111 mmol/L Final  . CO2 06/04/2018 29  22 - 32 mmol/L Final  . Glucose, Bld 06/04/2018 112* 70 - 99 mg/dL Final  . BUN 06/04/2018 18  8 - 23 mg/dL Final  . Creatinine, Ser 06/04/2018  1.25* 0.61 - 1.24 mg/dL Final  . Calcium 06/04/2018 10.1  8.9 - 10.3 mg/dL Final  . GFR calc non Af Amer 06/04/2018 >60  >60 mL/min Final  . GFR calc Af Amer 06/04/2018 >60  >60 mL/min Final  . Anion gap 06/04/2018 11  5 - 15 Final   Performed at [facility], [place] [address] [place], [place] [address]     X-Rays:Dg Shoulder Left Port  Result Date: 06/11/2018 CLINICAL DATA:  Status post reverse shoulder arthroplasty EXAM: LEFT SHOULDER - 1 VIEW COMPARISON:  None. FINDINGS: Interval total reverse left shoulder arthroplasty without failure or complication. Normal alignment. Postsurgical changes in the surrounding soft tissues. IMPRESSION: Interval total reverse left shoulder arthroplasty. Electronically Signed   By: Kathreen Devoid   On: 06/11/2018 16:02    EKG: Orders placed or performed during the hospital encounter of 12/31/17  . EKG 12-Lead  . EKG 12-Lead     Hospital Course: CLAYTON BOSSERMAN is a 62 y.o. who was admitted to Hospital. They were brought to the operating room on 06/11/2018 and underwent Procedure(s): [place].  Patient tolerated the procedure well and was later transferred to the recovery room and then to the orthopaedic floor for postoperative care.  They were given PO and IV analgesics for pain control following their surgery.  They were given 24 hours of postoperative antibiotics of  Anti-infectives (From admission, onward)   Start     Dose/Rate Route Frequency Ordered Stop   06/11/18 1830  ceFAZolin (ANCEF) IVPB 2g/100 mL premix     2 g 200 mL/hr over 30 Minutes Intravenous Every 6 hours 06/11/18 1649 06/12/18 0517   06/11/18 1030  ceFAZolin (ANCEF) IVPB 2g/100 mL premix     2 g 200 mL/hr over 30 Minutes Intravenous On call to O.R. 06/11/18 1024 06/11/18 1315     and started on DVT prophylaxis in the form of Aspirin.   PT and OT were ordered for total joint protocol.  Discharge planning consulted to help with postop disposition and  equipment needs.  Patient had a Good night on the evening of surgery. On postoperative day #1 after his nerve block had resolved he felt appropriate to discharge home. Patient was seen in rounds and was ready to go home.   Diet: Regular diet Activity:NWB Follow-up:in 3 weeks Disposition - Home Discharged Condition: good   Discharge Instructions    Call MD / Call 911   Complete by:  As directed    If you experience chest pain or shortness of breath,  CALL 911 and be transported to the hospital emergency room.  If you develope a fever above 101 F, pus (white drainage) or increased drainage or redness at the wound, or calf pain, call your surgeon's office.   Constipation Prevention   Complete by:  As directed    Drink plenty of fluids.  Prune juice may be helpful.  You may use a stool softener, such as Colace (over the counter) 100 mg twice a day.  Use MiraLax (over the counter) for constipation as needed.   Diet - low sodium heart healthy   Complete by:  As directed    Increase activity slowly as tolerated   Complete by:  As directed      Allergies as of 06/12/2018      Reactions   Fentanyl Shortness Of Breath, Other (See Comments)   Reaction to [facility] ONLY > ? DOSE REGULATION ?   Oxycodone Other (See Comments)   Pt states he goes through hot and cold flashes withdrawal like symptoms      Medication List    TAKE these medications   allopurinol 300 MG tablet Commonly known as:  ZYLOPRIM Take 300 mg by mouth daily.   amLODipine 10 MG tablet Commonly known as:  NORVASC Take 10 mg by mouth at bedtime.   celecoxib 200 MG capsule Commonly known as:  CELEBREX Take 200 mg by mouth 2 (two) times daily.   clonazePAM 1 MG tablet Commonly known as:  KLONOPIN Take 1 mg by mouth daily as needed for anxiety.   CoQ-10 200 MG Caps Take 200 mg by mouth daily.   fluticasone 50 MCG/ACT nasal spray Commonly known as:  FLONASE Place 2 sprays into both nostrils daily as needed for  allergies.   ibuprofen 200 MG tablet Commonly known as:  ADVIL,MOTRIN Take 600 mg by mouth daily as needed for headache or moderate pain.   Magnesium 400 MG Tabs Take 400 mg by mouth daily.   multivitamin with minerals Tabs tablet Take 1 tablet by mouth daily.   ondansetron 4 MG disintegrating tablet Commonly known as:  ZOFRAN ODT Take 1 tablet (4 mg total) by mouth every 8 (eight) hours as needed.   pantoprazole 20 MG tablet Commonly known as:  PROTONIX Take 20 mg by mouth every evening.   propranolol ER 80 MG 24 hr capsule Commonly known as:  INDERAL LA Take 80 mg by mouth every evening.   ranitidine 150 MG tablet Commonly known as:  ZANTAC Take 150 mg by mouth daily as needed for heartburn.   testosterone cypionate 200 MG/ML injection Commonly known as:  DEPOTESTOSTERONE CYPIONATE Inject 100 mg into the muscle every Wednesday.   traMADol 50 MG tablet Commonly known as:  ULTRAM Take 1 tablet (50 mg total) by mouth every 6 (six) hours as needed for moderate pain.      Follow-up Information    [name], MD In 3 weeks.   Specialty:  Orthopedic Surgery Why:  For suture removal, For wound re-check Contact information: [address] [place] [address] [place] [address] [phone]           Signed: Geralynn Rile, MD Orthopaedic Surgery 06/14/2018, 11:43 AM

## 2018-06-28 IMAGING — CT CT SHOULDER*L* W/O CM
4 of 6 series · 14 of 33 positions shown, 16 images · non-contrast
Comparison: Left shoulder x-rays dated December 08, 2016. CT left
shoulder dated November 03, 2016.

CLINICAL DATA: Left shoulder pain for the past 3 weeks after
swimming injury. Prior reverse total shoulder arthroplasty.

EXAM:
CT OF THE UPPER LEFT EXTREMITY WITHOUT CONTRAST
TECHNIQUE: Multidetector CT imaging of the upper left extremity was performed
according to the standard protocol.

[Series 2: shoulder 2.00 br40 s3 (person_name) · axial · 0.45mm/px · z∈[-880,-736]mm · 5 of 108 slices shown, 7 images]
[im 18/108  soft-tissue]
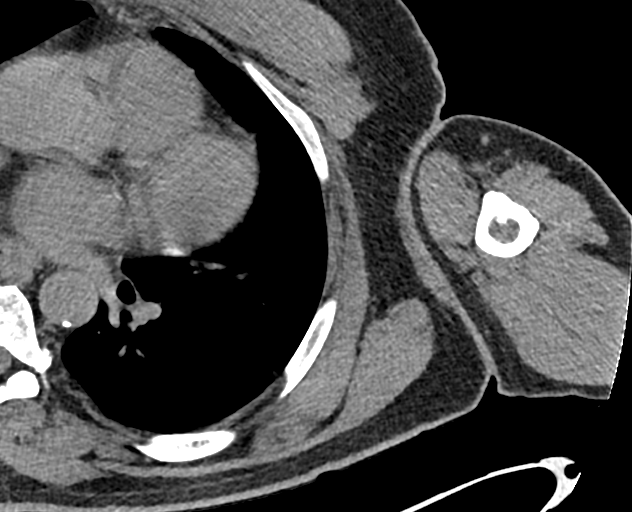
[im 18/108  bone]
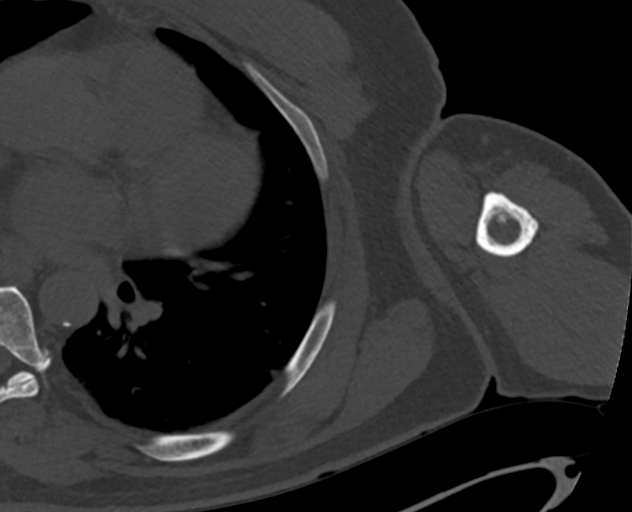
[im 36/108  bone]
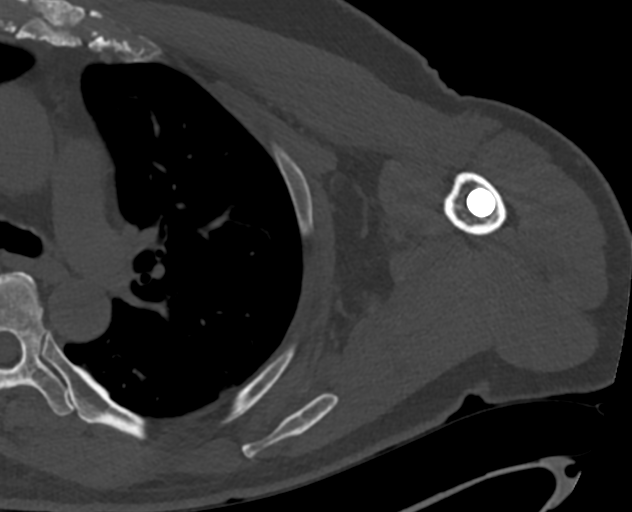
[im 54/108  bone]
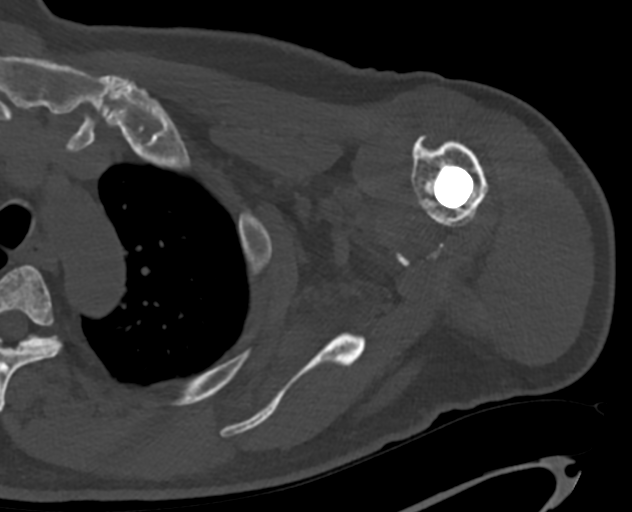
[im 72/108  bone]
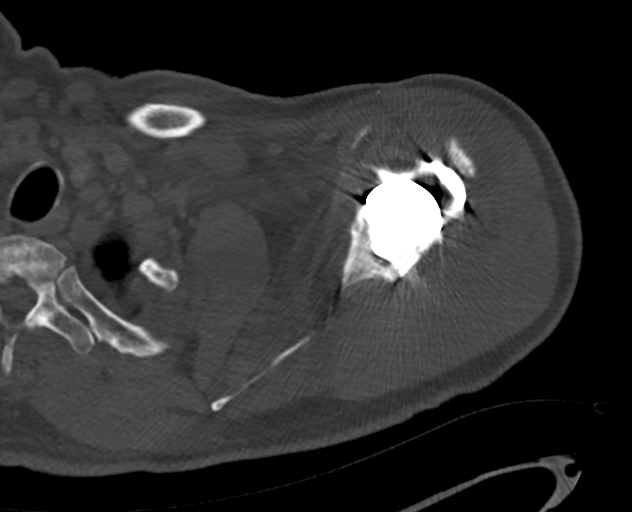
[im 90/108  soft-tissue]
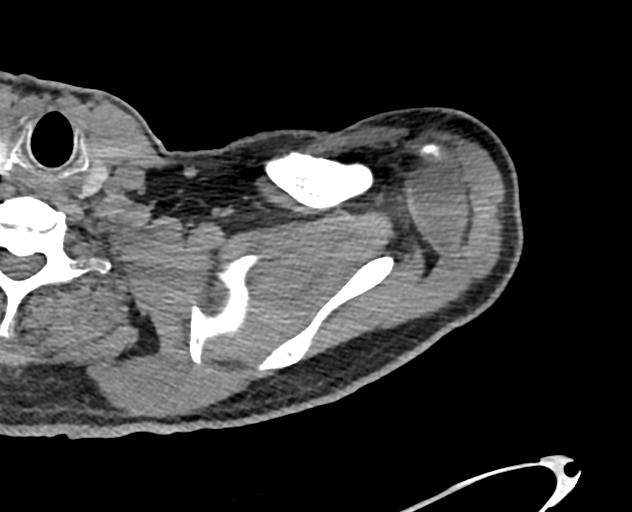
[im 90/108  bone]
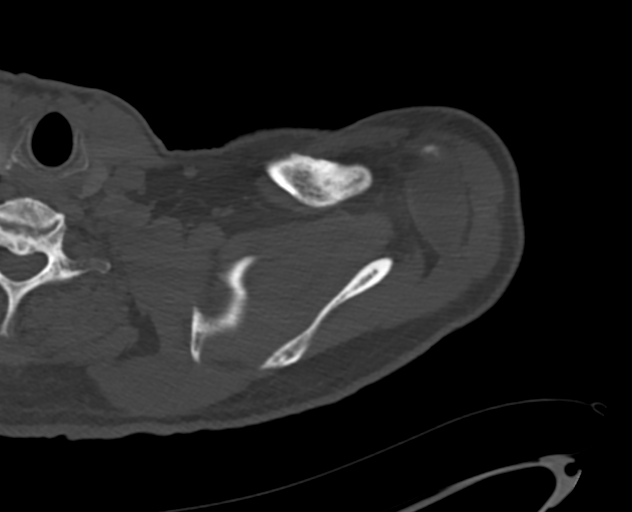

[Series 4: shoulder 2.00 br60 s3 ax · axial · 0.45mm/px · z∈[-880,-808]mm · 3 of 108 slices shown]
[im 18/108  bone]
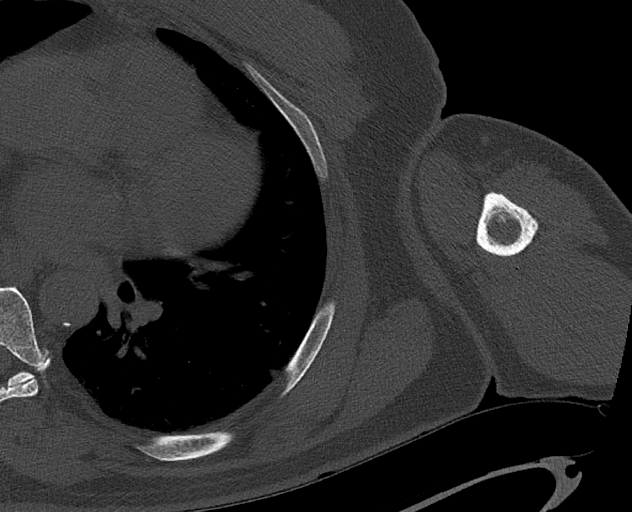
[im 36/108  bone]
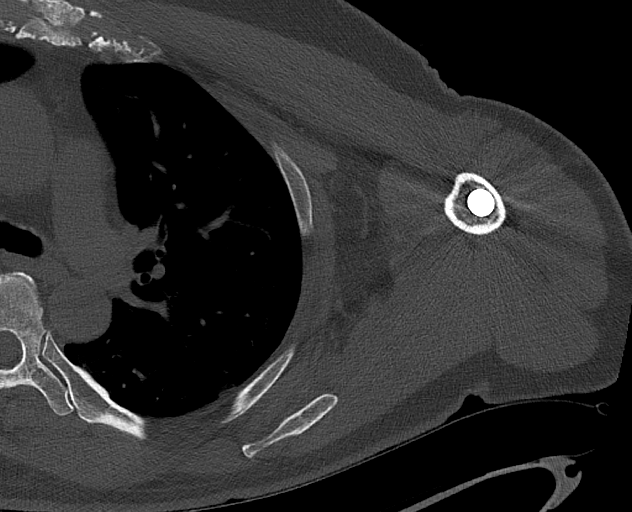
[im 54/108  bone]
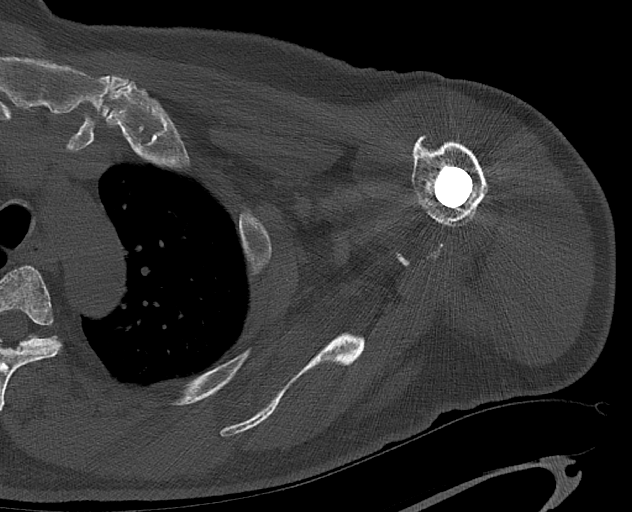

[Series 6: shoulder 2.00 br40 s3 cor · coronal · 0.42mm/px · 1 of 106 slices shown]
[im 53/106  bone]
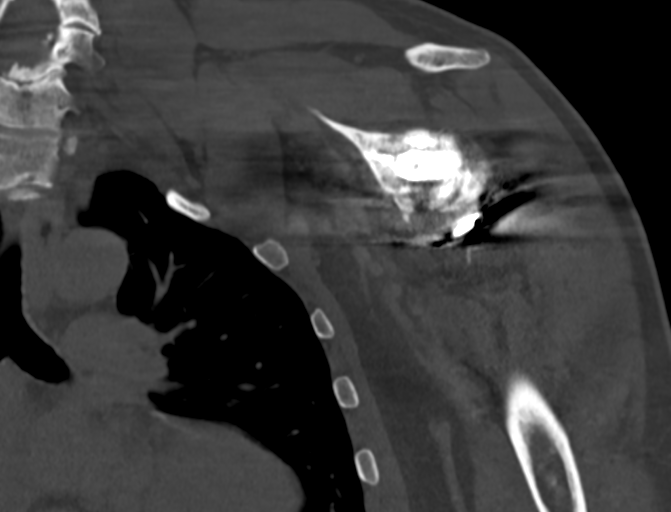

[Series 10: shoulder 2.00 br40 s3 sag · sagittal · 0.41mm/px · 5 of 141 slices shown]
[im 24/141  bone]
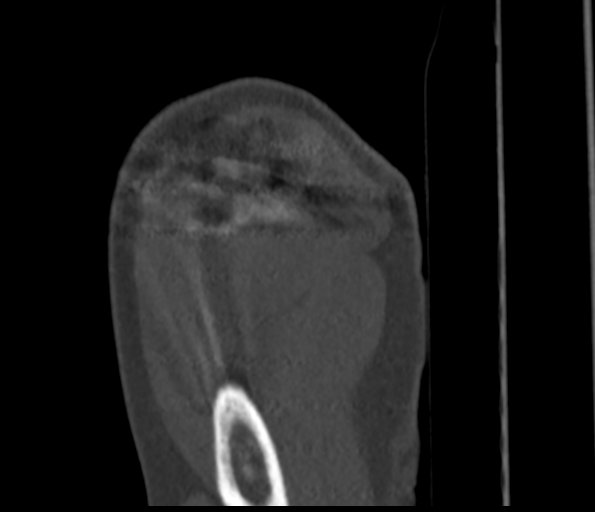
[im 47/141  bone]
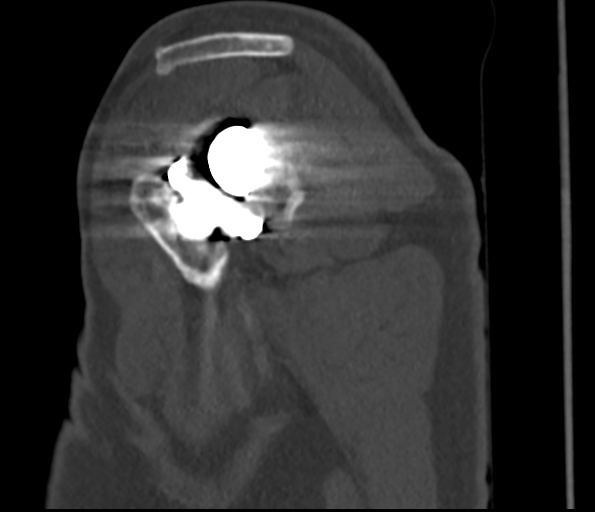
[im 71/141  bone]
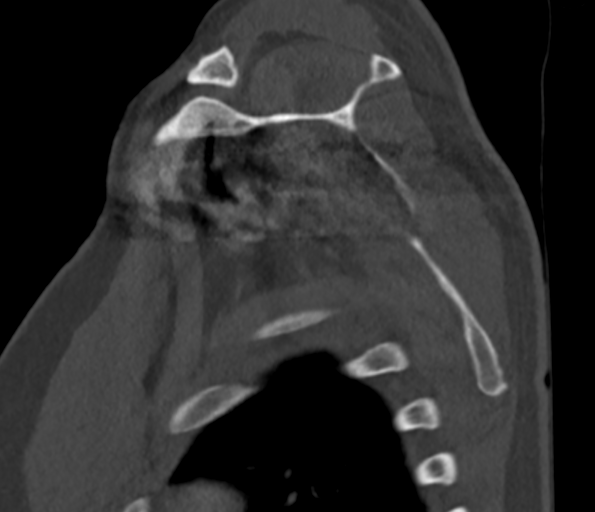
[im 94/141  bone]
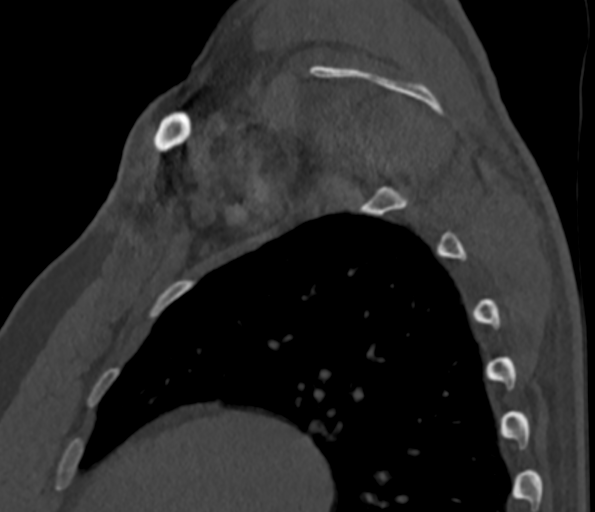
[im 117/141  bone]
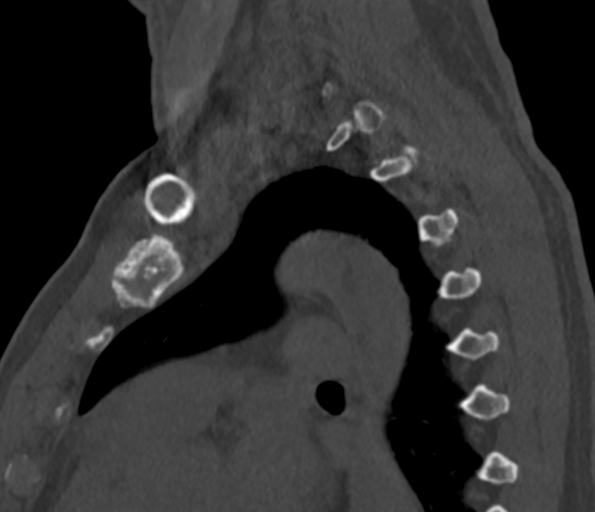

[14 of 33 positions shown; findings below may reference images not displayed]

FINDINGS: Bones/Joint/Cartilage

Prior reverse total shoulder arthroplasty. There are fractures of
the medial most and inferior most glenoid component screws (series
4, image 34 and 41). There is lucency between the inferior portion
of the glenoid component in the adjacent glenoid bone (series 12,
image 86). The humeral component is unremarkable. Small amount of
heterotopic ossification along the posterior inferior glenohumeral
joint. No acute fracture or dislocation. Large amount of fluid in
the subacromial/subdeltoid bursa.

Ligaments

Suboptimally assessed by CT.

Muscles and Tendons

Mild to moderate subscapularis muscle atrophy, unchanged.

Soft tissues

Unremarkable.  The visualized left lung is clear.
IMPRESSION: 1. Prior reverse total shoulder arthroplasty with fractures of two
of the glenoid component screws and lucency between the inferior
portion of the glenoid component and the adjacent glenoid bone,
consistent with loosening.
2. Large amount of fluid within the subacromial/subdeltoid bursa,
likely reflecting bursitis.

## 2018-07-02 DIAGNOSIS — Z96612 Presence of left artificial shoulder joint: Secondary | ICD-10-CM | POA: Diagnosis not present

## 2018-07-02 DIAGNOSIS — M25312 Other instability, left shoulder: Secondary | ICD-10-CM | POA: Diagnosis not present

## 2018-07-08 DIAGNOSIS — Z471 Aftercare following joint replacement surgery: Secondary | ICD-10-CM | POA: Diagnosis not present

## 2018-07-08 DIAGNOSIS — Z96612 Presence of left artificial shoulder joint: Secondary | ICD-10-CM | POA: Diagnosis not present

## 2018-07-09 ENCOUNTER — Encounter: Payer: Self-pay | Admitting: Infectious Disease

## 2018-07-09 ENCOUNTER — Ambulatory Visit (INDEPENDENT_AMBULATORY_CARE_PROVIDER_SITE_OTHER): Payer: PPO | Admitting: Infectious Disease

## 2018-07-09 VITALS — BP 150/101 | HR 96 | Temp 98.0°F | Ht 71.0 in | Wt 217.0 lb

## 2018-07-09 DIAGNOSIS — T8459XD Infection and inflammatory reaction due to other internal joint prosthesis, subsequent encounter: Secondary | ICD-10-CM | POA: Diagnosis not present

## 2018-07-09 DIAGNOSIS — Z96619 Presence of unspecified artificial shoulder joint: Principal | ICD-10-CM

## 2018-07-09 DIAGNOSIS — Z96612 Presence of left artificial shoulder joint: Secondary | ICD-10-CM | POA: Diagnosis not present

## 2018-07-09 NOTE — Progress Notes (Signed)
Subjective:    Chief complaint: Severe left shoulder pain which is 8-9 out of 10 in severity aggravated by movement but present nearly all the time    Patient ID: Geoffrey West, male    DOB: 09-26-1955, 63 y.o.   MRN: [id number]  HPI  63 year old man with HTN, gout, low testosterone who has had failed Total shoulder arthroplasty with loosening and shoulder dislocation. He was taken to OR by Dr. [name] on June 15th of 2018 and had shoulder reviisino and reversal of total shoulder arthroplasty. 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   I am for the first time in December 25, 2016 he actually was having no pain whatsoever.   We worked him into clinic urgently after phone call from Dr. [name] arrange for placement of a PICC line.  However he did not like the PICC line and we ended up changing him to oral antibiotics in the form of   We worked to place  IV ceftriaxone 2grams daily to cover the P acnes and the Coag Neg Staph plus rifampin 300 mg BID  Zyvox and rifampin but had trouble tolerating this regimen as well in particular the rifampin which we then discontinued and ultimately transitioned him to Augmentin. I planned him  to have 6 months of therapy and emphasized that we should see him again in January 2019 which would be the six-month mark of treatment of his prosthetic joint infection.  I saw him in January 2019 and his inflammatory markers were reassuring and he was not having pain.  Unfortunately after this he was admitted in the summertime after he had developed severe pain and was found on CT scan to have glenoid component screws that had loosened with a lucency between the inferior portion of the glenoid component and just glenoid bone with large amounts of fluid in the subacromial and subdeltoid bursa.  To the operating room on July 9 and underwent  explantation of all components of his left reverse shoulder arthroplasty with insertion of antibiotic spacer in the left shoulder with open reduction internal internal fixation of a left proximal humerus fracture.  [name] did mention his operative note that there was problems with the superior and anterior peripheral locking screws which are fractured.  He said they were not able to retrieve the tip of the screw as it was deep in the glenoid vault.  Cultures were sent and the patient then did grow Propionibacterium on all 4 specimens were sent.  Patient was treated with ceftriaxone as an inpatient also had gotten vancomycin.  He was ultimately discharged on oral Keflex 500 mg 4 times daily because he did not want have a PICC line again.  Unfortunately not gain control of infection with the surgery and antibiotics and he had to go back to the operating room yet again timbre underwent she underwent explantation of his humeral antibiotic spacer, bone grafting of the left glenoid fossa bone defect excisional debridement of bone muscle subcutaneous tissue and placement of a left antibiotic spacer.  Ulcers done in September on antibiotics failed to yield an organism.  He ultimately was taken back to the operating room in December still on oral Keflex per our recommendations and underwent plan of his humeral antibiotic spacer and reversal of his total shoulder revision.  He was doing well postoperatively and had hardly any pain and minimal need for  any pain meds.  He states he only took a few doses of tramadol.  However in the last week he is developed severe pain in his left shoulder.  He wonders if it could be due to the fact that he is now going through physical therapy and rehab and he also had a massage where his shoulder was down.  He states he has pain there all the time but is worse when he moves the shoulder and he rates it as being dull deep but also sharp and can be up to 8 or 9 out of 10 in severity.  He is  currently trying to manage this with gabapentin and nonsteroidal along with tramadol but he is not achieving adequate pain control.  I am certainly concerned that this could be evidence of his infection recurring yet again.  He is communicated with Dr. [name] who got plain films already but now is planning on a CT scan   Past Medical History:  Diagnosis Date  . Anxiety   . Arthritis    "all over" (03/06/2018)  . GERD (gastroesophageal reflux disease)   . Gout    "on daily RX" (03/06/2018)  . Hypertension   . Hypertension 12/25/2016  . Low testosterone 12/25/2016  . Migraine    "haven't had more than 1/year since propanolol started" (03/06/2018)  . 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 Situational depression    "recently lost my mom and brother" (03/06/2018)    Past Surgical History:  Procedure Laterality Date  . BACK SURGERY    . COLONOSCOPY    . COLONOSCOPY W/ BIOPSIES AND POLYPECTOMY    . EXCISIONAL TOTAL SHOULDER ARTHROPLASTY WITH ANTIBIOTIC SPACER Left 03/06/2018   Procedure: Left shoulder antibiotic spacer expalnt with glenoid bone grafting, antiobiotic spacer placement;  Surgeon: [name], MD;  Location: [facility];  Service: Orthopedics;  Laterality: Left;  . IR FLUORO GUIDE CV LINE RIGHT  12/26/2016  . JOINT REPLACEMENT    . KNEE ARTHROSCOPY Bilateral    X 10 total arthroscopies  . KNEE CARTILAGE SURGERY Left 1977   "cartilage removed"  . LASIK Bilateral   . LUMBAR DISC SURGERY  2000s   "ruptured disc"  . REVERSE SHOULDER ARTHROPLASTY Left 12/08/2016   Procedure: Revision left total shoulder to reverse total shoulder arthroplasty;  Surgeon: [name], MD;  Location: [facility];  Service: Orthopedics;  Laterality: Left;  . REVERSE SHOULDER ARTHROPLASTY Left 01/01/2018   Procedure: Left reverse shouler explant with antibiotic spacer placement;  Surgeon: [name], MD;  Location: [facility];  Service: Orthopedics;  Laterality: Left;  2.5 hrs  .  REVERSE SHOULDER ARTHROPLASTY Left 06/11/2018   Procedure: REVERSE TOTAL SHOULDER REVISION;  Surgeon: [name], MD;  Location: [facility];  Service: Orthopedics;  Laterality: Left;  . SHOULDER ARTHROSCOPY Right 1980s/1990s   "bone spur removed"  . SHOULDER OPEN ROTATOR CUFF REPAIR Right 1970s  . SHOULDER SURGERY Left 03/06/2018   antibiotic spacer expalnt with glenoid bone grafting, antiobiotic spacer placement  . TOTAL KNEE ARTHROPLASTY Bilateral   . TOTAL SHOULDER ARTHROPLASTY Left ~ 2014-2017 X 2   "in [place]"  . TOTAL SHOULDER REPLACEMENT Right ~ 2009   "in [place]"  . WISDOM TOOTH EXTRACTION      Family History  Problem Relation Age of Onset  . Arthritis Mother   . Arthritis Sister   . Alzheimer's disease Brother   . Arthritis Brother       Social History   Socioeconomic  History  . Marital status: Single    Spouse name: Not on file  . Number of children: Not on file  . Years of education: Not on file  . Highest education level: Not on file  Occupational History  . Not on file  Social Needs  . Financial resource strain: Not on file  . Food insecurity:    Worry: Not on file    Inability: Not on file  . Transportation needs:    Medical: Not on file    Non-medical: Not on file  Tobacco Use  . Smoking status: Never Smoker  . Smokeless tobacco: Never Used  Substance and Sexual Activity  . Alcohol use: Not Currently    Comment: 03/06/2018 "max of 2 drinks/year; if that"  . Drug use: Never  . Sexual activity: Not Currently  Lifestyle  . Physical activity:    Days per week: Not on file    Minutes per session: Not on file  . Stress: Not on file  Relationships  . Social connections:    Talks on phone: Not on file    Gets together: Not on file    Attends religious service: Not on file    Active member of club or organization: Not on file    Attends meetings of clubs or organizations: Not on file    Relationship status: Not on file  Other Topics  Concern  . Not on file  Social History Narrative  . Not on file    Allergies  Allergen Reactions  . Fentanyl Shortness Of Breath and Other (See Comments)    Reaction to [facility] ONLY > ? DOSE REGULATION ?   [name] Oxycodone Other (See Comments)    Pt states he goes through hot and cold flashes withdrawal like symptoms     Current Outpatient Medications:  .  allopurinol (ZYLOPRIM) 300 MG tablet, Take 300 mg by mouth daily., Disp: , Rfl:  .  amLODipine (NORVASC) 10 MG tablet, Take 10 mg by mouth at bedtime. , Disp: , Rfl:  .  celecoxib (CELEBREX) 200 MG capsule, Take 200 mg by mouth 2 (two) times daily., Disp: , Rfl: 3 .  clonazePAM (KLONOPIN) 1 MG tablet, Take 1 mg by mouth daily as needed for anxiety., Disp: , Rfl:  .  Coenzyme Q10 (COQ-10) 200 MG CAPS, Take 200 mg by mouth daily., Disp: , Rfl:  .  fluticasone (FLONASE) 50 MCG/ACT nasal spray, Place 2 sprays into both nostrils daily as needed for allergies. , Disp: , Rfl: 3 .  ibuprofen (ADVIL,MOTRIN) 200 MG tablet, Take 600 mg by mouth daily as needed for headache or moderate pain. , Disp: , Rfl:  .  Magnesium 400 MG TABS, Take 400 mg by mouth daily., Disp: , Rfl:  .  Multiple Vitamin (MULTIVITAMIN WITH MINERALS) TABS tablet, Take 1 tablet by mouth daily., Disp: , Rfl:  .  ondansetron (ZOFRAN ODT) 4 MG disintegrating tablet, Take 1 tablet (4 mg total) by mouth every 8 (eight) hours as needed., Disp: 20 tablet, Rfl: 0 .  pantoprazole (PROTONIX) 20 MG tablet, Take 20 mg by mouth every evening., Disp: , Rfl:  .  propranolol ER (INDERAL LA) 80 MG 24 hr capsule, Take 80 mg by mouth every evening. , Disp: , Rfl: 1 .  ranitidine (ZANTAC) 150 MG tablet, Take 150 mg by mouth daily as needed for heartburn., Disp: , Rfl:  .  testosterone cypionate (DEPOTESTOSTERONE CYPIONATE) 200 MG/ML injection, Inject 100 mg into the muscle every Wednesday. ,  Disp: , Rfl: 5 .  traMADol (ULTRAM) 50 MG tablet, Take 1 tablet (50 mg total) by mouth every 6 (six) hours as  needed for moderate pain., Disp: 50 tablet, Rfl: 0   Review of Systems  Constitutional: Negative for chills and fever.  HENT: Negative for congestion and sore throat.   Eyes: Negative for photophobia.  Respiratory: Negative for cough, shortness of breath and wheezing.   Cardiovascular: Negative for chest pain, palpitations and leg swelling.  Gastrointestinal: Negative for abdominal pain, blood in stool, constipation, diarrhea, nausea and vomiting.  Genitourinary: Negative for dysuria, flank pain and hematuria.  Musculoskeletal: Negative for back pain and myalgias.  Skin: Positive for wound. Negative for rash.  Neurological: Negative for dizziness, weakness and headaches.  Hematological: Does not bruise/bleed easily.  Psychiatric/Behavioral: Negative for suicidal ideas.       Objective:   Physical Exam  Constitutional: He is oriented to person, place, and time. He appears well-developed and well-nourished. No distress.  HENT:  Head: Normocephalic and atraumatic.  Mouth/Throat: No oropharyngeal exudate.  Eyes: Conjunctivae and EOM are normal. No scleral icterus.  Neck: Normal range of motion. Neck supple.  Cardiovascular: Normal rate and regular rhythm.  Pulmonary/Chest: Effort normal. No respiratory distress. He has no wheezes.  Abdominal: He exhibits no distension.  Musculoskeletal:        General: No tenderness or edema.  Neurological: He is alert and oriented to person, place, and time. He exhibits normal muscle tone. Coordination normal.  Skin: Skin is warm and dry. No rash noted. He is not diaphoretic. No erythema. No pallor.  Psychiatric: He has a normal mood and affect. His behavior is normal. Judgment and thought content normal.   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  We will see what the CT scan results show and keep close communication with Dr. [name]  I spent greater than 25 minutes with the patient including greater than 50% of time in face to face counsel of the patient nature of prostatic joint infections how to monitor them and in coordination of his care.

## 2018-07-10 ENCOUNTER — Telehealth: Payer: Self-pay

## 2018-07-10 LAB — CBC WITH DIFFERENTIAL/PLATELET
Absolute Monocytes: 1205 cells/uL — ABNORMAL HIGH (ref 200–950)
Basophils Absolute: 52 cells/uL (ref 0–200)
Basophils Relative: 0.5 %
Eosinophils Absolute: 350 cells/uL (ref 15–500)
Eosinophils Relative: 3.4 %
HCT: 50.1 % — ABNORMAL HIGH (ref 38.5–50.0)
HEMOGLOBIN: 17.4 g/dL — AB (ref 13.2–17.1)
Lymphs Abs: 1339 cells/uL (ref 850–3900)
MCH: 32.3 pg (ref 27.0–33.0)
MCHC: 34.7 g/dL (ref 32.0–36.0)
MCV: 93.1 fL (ref 80.0–100.0)
MPV: 11.2 fL (ref 7.5–12.5)
Monocytes Relative: 11.7 %
Neutro Abs: 7354 cells/uL (ref 1500–7800)
Neutrophils Relative %: 71.4 %
Platelets: 232 10*3/uL (ref 140–400)
RBC: 5.38 10*6/uL (ref 4.20–5.80)
RDW: 12.8 % (ref 11.0–15.0)
Total Lymphocyte: 13 %
WBC: 10.3 10*3/uL (ref 3.8–10.8)

## 2018-07-10 LAB — BASIC METABOLIC PANEL WITH GFR
BUN/Creatinine Ratio: 12 (calc) (ref 6–22)
BUN: 18 mg/dL (ref 7–25)
CALCIUM: 9.6 mg/dL (ref 8.6–10.3)
CO2: 30 mmol/L (ref 20–32)
Chloride: 96 mmol/L — ABNORMAL LOW (ref 98–110)
Creat: 1.49 mg/dL — ABNORMAL HIGH (ref 0.70–1.25)
GFR, Est African American: 58 mL/min/{1.73_m2} — ABNORMAL LOW (ref >=60)
GFR, Est Non African American: 50 mL/min/{1.73_m2} — ABNORMAL LOW (ref >=60)
GLUCOSE: 94 mg/dL (ref 65–99)
Potassium: 4.1 mmol/L (ref 3.5–5.3)
Sodium: 137 mmol/L (ref 135–146)

## 2018-07-10 LAB — C-REACTIVE PROTEIN: CRP: 89.8 mg/L — ABNORMAL HIGH (ref <8.0)

## 2018-07-10 LAB — SEDIMENTATION RATE: Sed Rate: 46 mm/h — ABNORMAL HIGH (ref 0–20)

## 2018-07-10 NOTE — Telephone Encounter (Signed)
-----  Message from Cornelius N Van Dam, MD sent at 07/10/2018  9:56 AM EST ----- Can we fax labs to Dr. Jason Rogers office I believe [facility] and make sure he knows about pts HIGH ESR. I am very worried he has infection in his new prosthetic shoulder 

## 2018-07-10 NOTE — Telephone Encounter (Signed)
Excellent

## 2018-07-10 NOTE — Telephone Encounter (Signed)
Labs faxed to Dr. Dennie Maizes office with [facility] per Dr. Tommy Medal request.  S.Gabor Lusk, LPN

## 2018-07-12 ENCOUNTER — Other Ambulatory Visit: Payer: Self-pay | Admitting: Family

## 2018-07-12 DIAGNOSIS — G479 Sleep disorder, unspecified: Secondary | ICD-10-CM | POA: Diagnosis not present

## 2018-07-12 DIAGNOSIS — E291 Testicular hypofunction: Secondary | ICD-10-CM | POA: Diagnosis not present

## 2018-07-12 DIAGNOSIS — R5383 Other fatigue: Secondary | ICD-10-CM | POA: Diagnosis not present

## 2018-07-15 ENCOUNTER — Other Ambulatory Visit: Payer: Self-pay

## 2018-07-15 ENCOUNTER — Encounter (HOSPITAL_COMMUNITY): Payer: Self-pay | Admitting: *Deleted

## 2018-07-15 MED ORDER — TRANEXAMIC ACID-NACL 1000-0.7 MG/100ML-% IV SOLN
1000.0000 mg | INTRAVENOUS | Status: AC
  Administered 2018-07-16: 1000 mg via INTRAVENOUS
  Filled 2018-07-15: qty 100

## 2018-07-15 NOTE — Progress Notes (Signed)
Spoke with pt for pre-op call. Pt denies cardiac history or diabetes.  

## 2018-07-16 ENCOUNTER — Inpatient Hospital Stay (HOSPITAL_COMMUNITY): Payer: PPO | Admitting: Anesthesiology

## 2018-07-16 ENCOUNTER — Inpatient Hospital Stay (HOSPITAL_COMMUNITY): Payer: PPO

## 2018-07-16 ENCOUNTER — Other Ambulatory Visit: Payer: Self-pay

## 2018-07-16 ENCOUNTER — Encounter (HOSPITAL_COMMUNITY): Payer: Self-pay | Admitting: Certified Registered Nurse Anesthetist

## 2018-07-16 ENCOUNTER — Encounter (HOSPITAL_COMMUNITY)
Admission: RE | Disposition: A | Discharge status: Home Health | Payer: Self-pay | Source: Home / Self Care | Attending: Orthopedic Surgery

## 2018-07-16 ENCOUNTER — Inpatient Hospital Stay (HOSPITAL_COMMUNITY)
Admission: RE | Admit: 2018-07-16 | Discharge: 2018-07-18 | DRG: 483 | Disposition: A | Discharge status: Home Health | Payer: PPO | Attending: Orthopedic Surgery | Admitting: Orthopedic Surgery

## 2018-07-16 DIAGNOSIS — T8459XD Infection and inflammatory reaction due to other internal joint prosthesis, subsequent encounter: Secondary | ICD-10-CM

## 2018-07-16 DIAGNOSIS — K219 Gastro-esophageal reflux disease without esophagitis: Secondary | ICD-10-CM | POA: Diagnosis present

## 2018-07-16 DIAGNOSIS — F419 Anxiety disorder, unspecified: Secondary | ICD-10-CM | POA: Diagnosis present

## 2018-07-16 DIAGNOSIS — Z96612 Presence of left artificial shoulder joint: Secondary | ICD-10-CM | POA: Diagnosis present

## 2018-07-16 DIAGNOSIS — Z885 Allergy status to narcotic agent status: Secondary | ICD-10-CM

## 2018-07-16 DIAGNOSIS — Z8619 Personal history of other infectious and parasitic diseases: Secondary | ICD-10-CM

## 2018-07-16 DIAGNOSIS — Z471 Aftercare following joint replacement surgery: Secondary | ICD-10-CM | POA: Diagnosis not present

## 2018-07-16 DIAGNOSIS — I10 Essential (primary) hypertension: Secondary | ICD-10-CM | POA: Diagnosis not present

## 2018-07-16 DIAGNOSIS — M109 Gout, unspecified: Secondary | ICD-10-CM | POA: Diagnosis present

## 2018-07-16 DIAGNOSIS — T849XXA Unspecified complication of internal orthopedic prosthetic device, implant and graft, initial encounter: Secondary | ICD-10-CM | POA: Diagnosis not present

## 2018-07-16 DIAGNOSIS — Z79899 Other long term (current) drug therapy: Secondary | ICD-10-CM

## 2018-07-16 DIAGNOSIS — T8459XA Infection and inflammatory reaction due to other internal joint prosthesis, initial encounter: Principal | ICD-10-CM

## 2018-07-16 DIAGNOSIS — M1990 Unspecified osteoarthritis, unspecified site: Secondary | ICD-10-CM | POA: Diagnosis not present

## 2018-07-16 DIAGNOSIS — Y831 Surgical operation with implant of artificial internal device as the cause of abnormal reaction of the patient, or of later complication, without mention of misadventure at the time of the procedure: Secondary | ICD-10-CM | POA: Diagnosis present

## 2018-07-16 DIAGNOSIS — Z96619 Presence of unspecified artificial shoulder joint: Secondary | ICD-10-CM

## 2018-07-16 DIAGNOSIS — Z978 Presence of other specified devices: Secondary | ICD-10-CM | POA: Diagnosis not present

## 2018-07-16 DIAGNOSIS — Z96653 Presence of artificial knee joint, bilateral: Secondary | ICD-10-CM | POA: Diagnosis present

## 2018-07-16 DIAGNOSIS — Z9889 Other specified postprocedural states: Secondary | ICD-10-CM | POA: Diagnosis present

## 2018-07-16 DIAGNOSIS — G8918 Other acute postprocedural pain: Secondary | ICD-10-CM | POA: Diagnosis not present

## 2018-07-16 HISTORY — PX: REVISION TOTAL SHOULDER TO REVERSE TOTAL SHOULDER: SHX6313

## 2018-07-16 LAB — GLUCOSE, CAPILLARY
Glucose-Capillary: 92 mg/dL (ref 70–99)
Glucose-Capillary: 94 mg/dL (ref 70–99)

## 2018-07-16 SURGERY — REVISION, REVERSE TOTAL ARTHROPLASTY, SHOULDER
Anesthesia: General | Site: Shoulder | Laterality: Left

## 2018-07-16 MED ORDER — FENTANYL CITRATE (PF) 250 MCG/5ML IJ SOLN
INTRAMUSCULAR | Status: AC
  Filled 2018-07-16: qty 5

## 2018-07-16 MED ORDER — TRAMADOL HCL 50 MG PO TABS
50.0000 mg | ORAL_TABLET | Freq: Four times a day (QID) | ORAL | Status: DC
  Administered 2018-07-17 – 2018-07-18 (×6): 50 mg via ORAL
  Filled 2018-07-16 (×6): qty 1

## 2018-07-16 MED ORDER — ONDANSETRON HCL 4 MG PO TABS: 4.0000 mg | ORAL_TABLET | Freq: Four times a day (QID) | ORAL | Status: DC | PRN

## 2018-07-16 MED ORDER — ACETAMINOPHEN 325 MG PO TABS
325.0000 mg | ORAL_TABLET | Freq: Four times a day (QID) | ORAL | Status: DC | PRN
  Administered 2018-07-17 – 2018-07-18 (×2): 650 mg via ORAL
  Filled 2018-07-16 (×2): qty 2

## 2018-07-16 MED ORDER — ALLOPURINOL 300 MG PO TABS
300.0000 mg | ORAL_TABLET | Freq: Every day | ORAL | Status: DC
  Administered 2018-07-17 – 2018-07-18 (×2): 300 mg via ORAL
  Filled 2018-07-16 (×2): qty 1

## 2018-07-16 MED ORDER — VANCOMYCIN HCL IN DEXTROSE 1-5 GM/200ML-% IV SOLN
INTRAVENOUS | Status: AC
  Filled 2018-07-16: qty 200

## 2018-07-16 MED ORDER — HYDROCODONE-ACETAMINOPHEN 5-325 MG PO TABS: 1.0000 | ORAL_TABLET | ORAL | Status: DC | PRN

## 2018-07-16 MED ORDER — PHENYLEPHRINE 40 MCG/ML (10ML) SYRINGE FOR IV PUSH (FOR BLOOD PRESSURE SUPPORT)
PREFILLED_SYRINGE | INTRAVENOUS | Status: DC | PRN
  Administered 2018-07-16: 160 ug via INTRAVENOUS
  Administered 2018-07-16: 50 ug via INTRAVENOUS
  Administered 2018-07-16: 120 ug via INTRAVENOUS

## 2018-07-16 MED ORDER — FAMOTIDINE 20 MG PO TABS
20.0000 mg | ORAL_TABLET | Freq: Every day | ORAL | Status: DC
  Administered 2018-07-17 – 2018-07-18 (×2): 20 mg via ORAL
  Filled 2018-07-16 (×2): qty 1

## 2018-07-16 MED ORDER — DOCUSATE SODIUM 100 MG PO CAPS
100.0000 mg | ORAL_CAPSULE | Freq: Two times a day (BID) | ORAL | Status: DC
  Administered 2018-07-16 – 2018-07-18 (×4): 100 mg via ORAL
  Filled 2018-07-16 (×4): qty 1

## 2018-07-16 MED ORDER — ONDANSETRON HCL 4 MG/2ML IJ SOLN
INTRAMUSCULAR | Status: AC
  Filled 2018-07-16: qty 2

## 2018-07-16 MED ORDER — VANCOMYCIN HCL 10 G IV SOLR
1500.0000 mg | Freq: Once | INTRAVENOUS | Status: AC
  Administered 2018-07-16: 1500 mg via INTRAVENOUS
  Filled 2018-07-16: qty 1500

## 2018-07-16 MED ORDER — FENTANYL CITRATE (PF) 100 MCG/2ML IJ SOLN
INTRAMUSCULAR | Status: AC
  Administered 2018-07-16: 50 ug via INTRAVENOUS
  Filled 2018-07-16: qty 2

## 2018-07-16 MED ORDER — LIDOCAINE 2% (20 MG/ML) 5 ML SYRINGE
INTRAMUSCULAR | Status: AC
  Filled 2018-07-16: qty 5

## 2018-07-16 MED ORDER — PANTOPRAZOLE SODIUM 20 MG PO TBEC
20.0000 mg | DELAYED_RELEASE_TABLET | Freq: Every evening | ORAL | Status: DC
  Administered 2018-07-16 – 2018-07-17 (×2): 20 mg via ORAL
  Filled 2018-07-16 (×2): qty 1

## 2018-07-16 MED ORDER — MENTHOL 3 MG MT LOZG
1.0000 | LOZENGE | OROMUCOSAL | Status: DC | PRN
  Filled 2018-07-16: qty 9

## 2018-07-16 MED ORDER — EPHEDRINE 5 MG/ML INJ
INTRAVENOUS | Status: AC
  Filled 2018-07-16: qty 10

## 2018-07-16 MED ORDER — ACETAMINOPHEN 10 MG/ML IV SOLN: 1000.0000 mg | Freq: Once | INTRAVENOUS | Status: DC | PRN

## 2018-07-16 MED ORDER — ONDANSETRON HCL 4 MG/2ML IJ SOLN: 4.0000 mg | Freq: Four times a day (QID) | INTRAMUSCULAR | Status: DC | PRN

## 2018-07-16 MED ORDER — PHENOL 1.4 % MT LIQD: 1.0000 | OROMUCOSAL | Status: DC | PRN

## 2018-07-16 MED ORDER — BUPIVACAINE-EPINEPHRINE (PF) 0.5% -1:200000 IJ SOLN
INTRAMUSCULAR | Status: DC | PRN
  Administered 2018-07-16: 15 mL via PERINEURAL

## 2018-07-16 MED ORDER — ACETAMINOPHEN 325 MG PO TABS: 325.0000 mg | ORAL_TABLET | Freq: Once | ORAL | Status: DC

## 2018-07-16 MED ORDER — HYDROCODONE-ACETAMINOPHEN 7.5-325 MG PO TABS: 1.0000 | ORAL_TABLET | ORAL | Status: DC | PRN

## 2018-07-16 MED ORDER — TRANEXAMIC ACID-NACL 1000-0.7 MG/100ML-% IV SOLN
1000.0000 mg | Freq: Once | INTRAVENOUS | Status: DC
  Filled 2018-07-16: qty 100

## 2018-07-16 MED ORDER — PROPRANOLOL HCL ER 80 MG PO CP24
80.0000 mg | ORAL_CAPSULE | Freq: Every evening | ORAL | Status: DC
  Administered 2018-07-16 – 2018-07-17 (×2): 80 mg via ORAL
  Filled 2018-07-16 (×3): qty 1

## 2018-07-16 MED ORDER — SODIUM CHLORIDE 0.9 % IR SOLN
Status: DC | PRN
  Administered 2018-07-16: 3000 mL

## 2018-07-16 MED ORDER — MIDAZOLAM HCL 2 MG/2ML IJ SOLN
INTRAMUSCULAR | Status: AC
  Administered 2018-07-16: 2 mg via INTRAVENOUS
  Filled 2018-07-16: qty 2

## 2018-07-16 MED ORDER — AMLODIPINE BESYLATE 5 MG PO TABS
10.0000 mg | ORAL_TABLET | Freq: Every day | ORAL | Status: DC
  Administered 2018-07-16 – 2018-07-17 (×2): 10 mg via ORAL
  Filled 2018-07-16 (×2): qty 2

## 2018-07-16 MED ORDER — ROCURONIUM BROMIDE 50 MG/5ML IV SOSY
PREFILLED_SYRINGE | INTRAVENOUS | Status: AC
  Filled 2018-07-16: qty 20

## 2018-07-16 MED ORDER — SODIUM CHLORIDE 0.9 % IR SOLN
Status: DC | PRN
  Administered 2018-07-16: 1000 mL

## 2018-07-16 MED ORDER — TRANEXAMIC ACID-NACL 1000-0.7 MG/100ML-% IV SOLN
1000.0000 mg | Freq: Once | INTRAVENOUS | Status: AC
  Administered 2018-07-16: 1000 mg via INTRAVENOUS
  Filled 2018-07-16: qty 100

## 2018-07-16 MED ORDER — ACETAMINOPHEN 500 MG PO TABS
500.0000 mg | ORAL_TABLET | Freq: Four times a day (QID) | ORAL | Status: AC
  Administered 2018-07-16 – 2018-07-17 (×4): 500 mg via ORAL
  Filled 2018-07-16 (×4): qty 1

## 2018-07-16 MED ORDER — LACTATED RINGERS IV SOLN: INTRAVENOUS | Status: DC

## 2018-07-16 MED ORDER — PHENYLEPHRINE 40 MCG/ML (10ML) SYRINGE FOR IV PUSH (FOR BLOOD PRESSURE SUPPORT)
PREFILLED_SYRINGE | INTRAVENOUS | Status: AC
  Filled 2018-07-16: qty 10

## 2018-07-16 MED ORDER — PROPOFOL 10 MG/ML IV BOLUS
INTRAVENOUS | Status: AC
  Filled 2018-07-16: qty 20

## 2018-07-16 MED ORDER — VANCOMYCIN HCL 1000 MG IV SOLR
INTRAVENOUS | Status: DC | PRN
  Administered 2018-07-16: 1000 mg

## 2018-07-16 MED ORDER — MIDAZOLAM HCL 2 MG/2ML IJ SOLN
2.0000 mg | Freq: Once | INTRAMUSCULAR | Status: AC
  Administered 2018-07-16: 2 mg via INTRAVENOUS

## 2018-07-16 MED ORDER — ONDANSETRON HCL 4 MG/2ML IJ SOLN
INTRAMUSCULAR | Status: DC | PRN
  Administered 2018-07-16: 4 mg via INTRAVENOUS

## 2018-07-16 MED ORDER — FLUTICASONE PROPIONATE 50 MCG/ACT NA SUSP
2.0000 | Freq: Every day | NASAL | Status: DC | PRN
  Filled 2018-07-16: qty 16

## 2018-07-16 MED ORDER — ROCURONIUM BROMIDE 10 MG/ML (PF) SYRINGE
PREFILLED_SYRINGE | INTRAVENOUS | Status: DC | PRN
  Administered 2018-07-16: 50 mg via INTRAVENOUS

## 2018-07-16 MED ORDER — ACETAMINOPHEN 500 MG PO TABS
ORAL_TABLET | ORAL | Status: AC
  Administered 2018-07-16: 1000 mg via ORAL
  Filled 2018-07-16: qty 2

## 2018-07-16 MED ORDER — FENTANYL CITRATE (PF) 250 MCG/5ML IJ SOLN
INTRAMUSCULAR | Status: DC | PRN
  Administered 2018-07-16: 100 ug via INTRAVENOUS

## 2018-07-16 MED ORDER — MORPHINE SULFATE (PF) 2 MG/ML IV SOLN: 0.5000 mg | INTRAVENOUS | Status: DC | PRN

## 2018-07-16 MED ORDER — SODIUM CHLORIDE 0.9 % IV SOLN
INTRAVENOUS | Status: AC
  Filled 2018-07-16: qty 1.2

## 2018-07-16 MED ORDER — HYDROMORPHONE HCL 1 MG/ML IJ SOLN: 0.2500 mg | INTRAMUSCULAR | Status: DC | PRN

## 2018-07-16 MED ORDER — MAGNESIUM 400 MG PO TABS: 400.0000 mg | ORAL_TABLET | Freq: Every day | ORAL | Status: DC

## 2018-07-16 MED ORDER — GABAPENTIN 100 MG PO CAPS: 100.0000 mg | ORAL_CAPSULE | Freq: Three times a day (TID) | ORAL | Status: DC

## 2018-07-16 MED ORDER — METOCLOPRAMIDE HCL 5 MG PO TABS: 5.0000 mg | ORAL_TABLET | Freq: Three times a day (TID) | ORAL | Status: DC | PRN

## 2018-07-16 MED ORDER — ACETAMINOPHEN 500 MG PO TABS
1000.0000 mg | ORAL_TABLET | Freq: Once | ORAL | Status: AC
  Administered 2018-07-16: 1000 mg via ORAL

## 2018-07-16 MED ORDER — SODIUM CHLORIDE 0.9 % IV SOLN
INTRAVENOUS | Status: DC | PRN
  Administered 2018-07-16: 20 ug/min via INTRAVENOUS

## 2018-07-16 MED ORDER — DEXAMETHASONE SODIUM PHOSPHATE 10 MG/ML IJ SOLN
INTRAMUSCULAR | Status: AC
  Filled 2018-07-16: qty 1

## 2018-07-16 MED ORDER — COQ-10 200 MG PO CAPS: 200.0000 mg | ORAL_CAPSULE | Freq: Every day | ORAL | Status: DC

## 2018-07-16 MED ORDER — DEXAMETHASONE SODIUM PHOSPHATE 10 MG/ML IJ SOLN
INTRAMUSCULAR | Status: DC | PRN
  Administered 2018-07-16: 4 mg via INTRAVENOUS

## 2018-07-16 MED ORDER — ACETAMINOPHEN 160 MG/5ML PO SOLN: 325.0000 mg | Freq: Once | ORAL | Status: DC

## 2018-07-16 MED ORDER — METOCLOPRAMIDE HCL 5 MG/ML IJ SOLN: 5.0000 mg | Freq: Three times a day (TID) | INTRAMUSCULAR | Status: DC | PRN

## 2018-07-16 MED ORDER — VANCOMYCIN HCL 10 G IV SOLR
1250.0000 mg | Freq: Two times a day (BID) | INTRAVENOUS | Status: DC
  Administered 2018-07-17: 1250 mg via INTRAVENOUS
  Filled 2018-07-16 (×2): qty 1250

## 2018-07-16 MED ORDER — KETOROLAC TROMETHAMINE 15 MG/ML IJ SOLN
7.5000 mg | Freq: Four times a day (QID) | INTRAMUSCULAR | Status: AC
  Administered 2018-07-16 – 2018-07-17 (×4): 7.5 mg via INTRAVENOUS
  Filled 2018-07-16 (×4): qty 1

## 2018-07-16 MED ORDER — VANCOMYCIN HCL 1000 MG IV SOLR
INTRAVENOUS | Status: AC
  Filled 2018-07-16: qty 2000

## 2018-07-16 MED ORDER — MIDAZOLAM HCL 2 MG/2ML IJ SOLN
INTRAMUSCULAR | Status: AC
  Filled 2018-07-16: qty 2

## 2018-07-16 MED ORDER — CLONAZEPAM 1 MG PO TABS: 1.0000 mg | ORAL_TABLET | Freq: Two times a day (BID) | ORAL | Status: DC | PRN

## 2018-07-16 MED ORDER — CHLORHEXIDINE GLUCONATE 4 % EX LIQD: 60.0000 mL | Freq: Once | CUTANEOUS | Status: DC

## 2018-07-16 MED ORDER — GABAPENTIN 300 MG PO CAPS
300.0000 mg | ORAL_CAPSULE | Freq: Three times a day (TID) | ORAL | Status: DC
  Administered 2018-07-16 – 2018-07-18 (×5): 300 mg via ORAL
  Filled 2018-07-16 (×5): qty 1

## 2018-07-16 MED ORDER — BUPIVACAINE LIPOSOME 1.3 % IJ SUSP
INTRAMUSCULAR | Status: DC | PRN
  Administered 2018-07-16: 10 mL via PERINEURAL

## 2018-07-16 MED ORDER — MEPERIDINE HCL 50 MG/ML IJ SOLN: 6.2500 mg | INTRAMUSCULAR | Status: DC | PRN

## 2018-07-16 MED ORDER — LIDOCAINE 2% (20 MG/ML) 5 ML SYRINGE
INTRAMUSCULAR | Status: DC | PRN
  Administered 2018-07-16: 40 mg via INTRAVENOUS

## 2018-07-16 MED ORDER — PROPOFOL 10 MG/ML IV BOLUS
INTRAVENOUS | Status: DC | PRN
  Administered 2018-07-16: 150 mg via INTRAVENOUS

## 2018-07-16 MED ORDER — PROMETHAZINE HCL 25 MG/ML IJ SOLN: 6.2500 mg | INTRAMUSCULAR | Status: DC | PRN

## 2018-07-16 MED ORDER — 0.9 % SODIUM CHLORIDE (POUR BTL) OPTIME
TOPICAL | Status: DC | PRN
  Administered 2018-07-16: 1000 mL

## 2018-07-16 MED ORDER — MAGNESIUM OXIDE 400 (241.3 MG) MG PO TABS
400.0000 mg | ORAL_TABLET | Freq: Every day | ORAL | Status: DC
  Administered 2018-07-17 – 2018-07-18 (×2): 400 mg via ORAL
  Filled 2018-07-16 (×2): qty 1

## 2018-07-16 MED ORDER — LACTATED RINGERS IV SOLN
INTRAVENOUS | Status: DC
  Administered 2018-07-16 (×2): via INTRAVENOUS

## 2018-07-16 MED ORDER — FENTANYL CITRATE (PF) 100 MCG/2ML IJ SOLN
50.0000 ug | Freq: Once | INTRAMUSCULAR | Status: AC
  Administered 2018-07-16: 50 ug via INTRAVENOUS

## 2018-07-16 MED ORDER — ASPIRIN EC 325 MG PO TBEC
325.0000 mg | DELAYED_RELEASE_TABLET | Freq: Every day | ORAL | Status: DC
  Administered 2018-07-16 – 2018-07-18 (×3): 325 mg via ORAL
  Filled 2018-07-16 (×3): qty 1

## 2018-07-16 SURGICAL SUPPLY — 70 items
ADAPTER MINI TAPER W/B/PLAT 25 (Miscellaneous) ×2 IMPLANT
ALCOHOL 70% 16 OZ (MISCELLANEOUS) ×2 IMPLANT
BEARING HUMERAL +3 40D (Joint) ×2 IMPLANT
BEARING HUMERAL 40 STD VITE (Joint) ×2 IMPLANT
BIT DRILL 5/64X5 DISP (BIT) ×2 IMPLANT
BLADE SAG 18X100X1.27 (BLADE) ×2 IMPLANT
COVER SURGICAL LIGHT HANDLE (MISCELLANEOUS) ×2 IMPLANT
COVER WAND RF STERILE (DRAPES) ×2 IMPLANT
DIAL VERSA SHOULDER 40 STD (Joint) ×2 IMPLANT
DRAIN CHANNEL 10F 3/8 F FF (DRAIN) ×2 IMPLANT
DRAPE IMP U-DRAPE 54X76 (DRAPES) ×4 IMPLANT
DRAPE INCISE IOBAN 66X45 STRL (DRAPES) ×2 IMPLANT
DRAPE ORTHO SPLIT 77X108 STRL (DRAPES) ×2
DRAPE SURG 17X23 STRL (DRAPES) ×2 IMPLANT
DRAPE SURG ORHT 6 SPLT 77X108 (DRAPES) ×2 IMPLANT
DRAPE U-SHAPE 47X51 STRL (DRAPES) ×2 IMPLANT
DRESSING PREVENA PLUS CUSTOM (GAUZE/BANDAGES/DRESSINGS) ×1 IMPLANT
DRSG AQUACEL AG ADV 3.5X10 (GAUZE/BANDAGES/DRESSINGS) ×2 IMPLANT
DRSG PREVENA PLUS CUSTOM (GAUZE/BANDAGES/DRESSINGS) ×2
DURAPREP 26ML APPLICATOR (WOUND CARE) ×2 IMPLANT
ELECT BLADE 4.0 EZ CLEAN MEGAD (MISCELLANEOUS) ×2
ELECT REM PT RETURN 9FT ADLT (ELECTROSURGICAL) ×2
ELECTRODE BLDE 4.0 EZ CLN MEGD (MISCELLANEOUS) ×1 IMPLANT
ELECTRODE REM PT RTRN 9FT ADLT (ELECTROSURGICAL) ×1 IMPLANT
EVACUATOR SILICONE 100CC (DRAIN) ×2 IMPLANT
GLOVE BIO SURGEON STRL SZ7.5 (GLOVE) ×2 IMPLANT
GLOVE BIOGEL PI IND STRL 8 (GLOVE) ×1 IMPLANT
GLOVE BIOGEL PI INDICATOR 8 (GLOVE) ×1
GOWN STRL REUS W/ TWL LRG LVL3 (GOWN DISPOSABLE) ×1 IMPLANT
GOWN STRL REUS W/ TWL XL LVL3 (GOWN DISPOSABLE) ×1 IMPLANT
GOWN STRL REUS W/TWL LRG LVL3 (GOWN DISPOSABLE) ×1
GOWN STRL REUS W/TWL XL LVL3 (GOWN DISPOSABLE) ×1
KIT BASIN OR (CUSTOM PROCEDURE TRAY) ×2 IMPLANT
KIT DRSG PREVENA PLUS 7DAY 125 (MISCELLANEOUS) ×2 IMPLANT
KIT TURNOVER KIT B (KITS) ×2 IMPLANT
MANIFOLD NEPTUNE II (INSTRUMENTS) ×2 IMPLANT
NEEDLE 1/2 CIR MAYO (NEEDLE) ×2 IMPLANT
NEEDLE HYPO 25GX1X1/2 BEV (NEEDLE) ×2 IMPLANT
NS IRRIG 1000ML POUR BTL (IV SOLUTION) ×2 IMPLANT
PACK SHOULDER (CUSTOM PROCEDURE TRAY) ×2 IMPLANT
PAD ARMBOARD 7.5X6 YLW CONV (MISCELLANEOUS) ×4 IMPLANT
RESTRAINT HEAD UNIVERSAL NS (MISCELLANEOUS) ×2 IMPLANT
SLING ARM FOAM STRAP LRG (SOFTGOODS) ×2 IMPLANT
SLING ARM IMMOBILIZER LRG (SOFTGOODS) IMPLANT
SLING ARM IMMOBILIZER MED (SOFTGOODS) IMPLANT
SPONGE LAP 18X18 X RAY DECT (DISPOSABLE) IMPLANT
SPONGE LAP 4X18 RFD (DISPOSABLE) ×2 IMPLANT
STRIP CLOSURE SKIN 1/2X4 (GAUZE/BANDAGES/DRESSINGS) ×2 IMPLANT
SUCTION FRAZIER HANDLE 10FR (MISCELLANEOUS) ×1
SUCTION TUBE FRAZIER 10FR DISP (MISCELLANEOUS) ×1 IMPLANT
SUT ETHILON 3 0 FSL (SUTURE) ×2 IMPLANT
SUT FIBERWIRE #2 38 T-5 BLUE (SUTURE) ×2
SUT MNCRL AB 4-0 PS2 18 (SUTURE) ×2 IMPLANT
SUT MON AB 2-0 CT1 36 (SUTURE) ×2 IMPLANT
SUT PDS AB 0 CT 36 (SUTURE) ×2 IMPLANT
SUT SILK 2 0 SH (SUTURE) ×2 IMPLANT
SUT VIC AB 1 CT1 27 (SUTURE)
SUT VIC AB 1 CT1 27XBRD ANBCTR (SUTURE) IMPLANT
SUT VIC AB 2-0 CT1 27 (SUTURE) ×1
SUT VIC AB 2-0 CT1 TAPERPNT 27 (SUTURE) ×1 IMPLANT
SUTURE FIBERWR #2 38 T-5 BLUE (SUTURE) ×1 IMPLANT
SWAB COLLECTION DEVICE MRSA (MISCELLANEOUS) ×2 IMPLANT
SWAB CULTURE ESWAB REG 1ML (MISCELLANEOUS) ×2 IMPLANT
SYR CONTROL 10ML LL (SYRINGE) ×2 IMPLANT
TOWEL OR 17X24 6PK STRL BLUE (TOWEL DISPOSABLE) ×2 IMPLANT
TOWEL OR 17X26 10 PK STRL BLUE (TOWEL DISPOSABLE) ×2 IMPLANT
TOWER CARTRIDGE SMART MIX (DISPOSABLE) IMPLANT
TRAY HUM MINI SHOULDER +0 40D (Shoulder) ×2 IMPLANT
WATER STERILE IRR 1000ML POUR (IV SOLUTION) ×2 IMPLANT
YANKAUER SUCT BULB TIP NO VENT (SUCTIONS) ×2 IMPLANT

## 2018-07-16 NOTE — H&P (Signed)
ORTHOPAEDIC H and P  REQUESTING PHYSICIAN: [name], MD  PCP:  Orpah Melter, MD  Chief Complaint: Left shoulder pain  HPI: Geoffrey West is a 63 y.o. male who complains of increasing shoulder pain about 3 weeks after a reimplantation for chronically infected left shoulder.  He presents today for concern of reinfection given the acute nature of his pain as well as inflammatory markers that are elevated above threshold reduced suspect.  Has no new complaints today.  We have previously discussed our plan in the clinic as well as via the phone yesterday evening.  Past Medical History:  Diagnosis Date  . Anxiety   . Arthritis    "all over" (03/06/2018)  . GERD (gastroesophageal reflux disease)   . Gout    "on daily RX" (03/06/2018)  . Hypertension   . Hypertension 12/25/2016  . Low testosterone 12/25/2016  . Migraine    "haven't had more than 1/year since propanolol started" (03/06/2018)  . 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 Situational depression    "recently lost my mom and brother" (03/06/2018)   Past Surgical History:  Procedure Laterality Date  . BACK SURGERY    . COLONOSCOPY    . COLONOSCOPY W/ BIOPSIES AND POLYPECTOMY    . EXCISIONAL TOTAL SHOULDER ARTHROPLASTY WITH ANTIBIOTIC SPACER Left 03/06/2018   Procedure: Left shoulder antibiotic spacer expalnt with glenoid bone grafting, antiobiotic spacer placement;  Surgeon: [name], MD;  Location: [place];  Service: Orthopedics;  Laterality: Left;  . IR FLUORO GUIDE CV LINE RIGHT  12/26/2016  . JOINT REPLACEMENT    . KNEE ARTHROSCOPY Bilateral    X 10 total arthroscopies  . KNEE CARTILAGE SURGERY Left 1977   "cartilage removed"  . LASIK Bilateral   . LUMBAR DISC SURGERY  2000s   "ruptured disc"  . REVERSE SHOULDER ARTHROPLASTY Left 12/08/2016   Procedure: Revision left total shoulder to reverse total shoulder arthroplasty;  Surgeon: [name], MD;  Location: [place];  Service:  Orthopedics;  Laterality: Left;  . REVERSE SHOULDER ARTHROPLASTY Left 01/01/2018   Procedure: Left reverse shouler explant with antibiotic spacer placement;  Surgeon: [name], MD;  Location: [facility];  Service: Orthopedics;  Laterality: Left;  2.5 hrs  . REVERSE SHOULDER ARTHROPLASTY Left 06/11/2018   Procedure: REVERSE TOTAL SHOULDER REVISION;  Surgeon: [name], MD;  Location: [place];  Service: Orthopedics;  Laterality: Left;  . SHOULDER ARTHROSCOPY Right 1980s/1990s   "bone spur removed"  . SHOULDER OPEN ROTATOR CUFF REPAIR Right 1970s  . SHOULDER SURGERY Left 03/06/2018   antibiotic spacer expalnt with glenoid bone grafting, antiobiotic spacer placement  . TOTAL KNEE ARTHROPLASTY Bilateral   . TOTAL SHOULDER ARTHROPLASTY Left ~ 2014-2017 X 2   "in [place]"  . TOTAL SHOULDER REPLACEMENT Right ~ 2009   "in [place]"  . WISDOM TOOTH EXTRACTION     Social History   Socioeconomic History  . Marital status: Single    Spouse name: Not on file  . Number of children: Not on file  . Years of education: Not on file  . Highest education level: Not on file  Occupational History  . Not on file  Social Needs  . Financial resource strain: Not on file  . Food insecurity:    Worry: Not on file    Inability: Not on file  . Transportation needs:    Medical: Not on file    Non-medical: Not on file  Tobacco Use  .  Smoking status: Never Smoker  . Smokeless tobacco: Never Used  Substance and Sexual Activity  . Alcohol use: Not Currently    Comment: 03/06/2018 "max of 2 drinks/year; if that"  . Drug use: Never  . Sexual activity: Not Currently  Lifestyle  . Physical activity:    Days per week: Not on file    Minutes per session: Not on file  . Stress: Not on file  Relationships  . Social connections:    Talks on phone: Not on file    Gets together: Not on file    Attends religious service: Not on file    Active member of club or organization: Not on file     Attends meetings of clubs or organizations: Not on file    Relationship status: Not on file  Other Topics Concern  . Not on file  Social History Narrative  . Not on file   Family History  Problem Relation Age of Onset  . Arthritis Mother   . Arthritis Sister   . Alzheimer's disease Brother   . Arthritis Brother    Allergies  Allergen Reactions  . Fentanyl Shortness Of Breath and Other (See Comments)    Reaction to [facility] ONLY > ? DOSE REGULATION ?   [name] Oxycodone Other (See Comments)    Pt states he goes through hot and cold flashes withdrawal like symptoms   Prior to Admission medications   Medication Sig Start Date End Date Taking? Authorizing Provider  allopurinol (ZYLOPRIM) 300 MG tablet Take 300 mg by mouth daily.   Yes [provider]  amLODipine (NORVASC) 10 MG tablet Take 10 mg by mouth at bedtime.    Yes [provider]  celecoxib (CELEBREX) 200 MG capsule Take 200 mg by mouth 2 (two) times daily. 11/06/16  Yes [provider]  cephALEXin (KEFLEX) 500 MG capsule TAKE 1 CAPSULE (500 MG TOTAL) BY MOUTH EVERY 6 (SIX) HOURS. 07/15/18  Yes Tommy Medal, Lavell Islam, MD  Coenzyme Q10 (COQ-10) 200 MG CAPS Take 200 mg by mouth daily.   Yes [provider]  fluticasone (FLONASE) 50 MCG/ACT nasal spray Place 2 sprays into both nostrils daily as needed for allergies.  11/24/16  Yes [provider]  gabapentin (NEURONTIN) 100 MG capsule Take 100 mg by mouth 3 (three) times daily.   Yes [provider]  ibuprofen (ADVIL,MOTRIN) 200 MG tablet Take 600 mg by mouth daily as needed for headache or moderate pain.    Yes [provider]  Magnesium 400 MG TABS Take 400 mg by mouth daily.   Yes [provider]  meloxicam (MOBIC) 7.5 MG tablet Take 7.5 mg by mouth daily.   Yes [provider]  Multiple Vitamin (MULTIVITAMIN WITH MINERALS) TABS tablet Take 1 tablet by mouth daily.   Yes [provider]  pantoprazole  (PROTONIX) 20 MG tablet Take 20 mg by mouth every evening.   Yes [provider]  propranolol ER (INDERAL LA) 80 MG 24 hr capsule Take 80 mg by mouth every evening.  02/01/18  Yes [provider]  ranitidine (ZANTAC) 150 MG tablet Take 150 mg by mouth daily as needed for heartburn.   Yes [provider]  testosterone cypionate (DEPOTESTOSTERONE CYPIONATE) 200 MG/ML injection Inject 100 mg into the muscle every Wednesday.    Yes [provider]  traMADol (ULTRAM) 50 MG tablet Take 1 tablet (50 mg total) by mouth every 6 (six) hours as needed for moderate pain. 06/12/18  Yes  [name], MD  clonazePAM (KLONOPIN) 1 MG tablet Take 1 mg by mouth daily as needed for anxiety.    [provider]  ondansetron (ZOFRAN ODT) 4 MG disintegrating tablet Take 1 tablet (4 mg total) by mouth every 8 (eight) hours as needed. Patient not taking: Reported on 07/12/2018 06/12/18   [name], MD   No results found.  Positive ROS: All other systems have been reviewed and were otherwise negative with the exception of those mentioned in the HPI and as above.  Physical Exam: General: Alert, no acute distress Cardiovascular: No pedal edema Respiratory: No cyanosis, no use of accessory musculature GI: No organomegaly, abdomen is soft and non-tender Skin: No lesions in the area of chief complaint Neurologic: Sensation intact distally Psychiatric: Patient is competent for consent with normal mood and affect Lymphatic: No axillary or cervical lymphadenopathy    Assessment: -Acutely infected left reverse shoulder arthroplasty  Plan: -Tommy and I again reviewed our plan for open irrigation and debridement including incisional and excisional.  We will exchange modular components and then lavaged copiously.  We will plan for removal of hardware if there is any signs of loosening.  We will send cultures.  We will admit him postoperatively for IV antibiotics  and consultation with the infectious disease specialist. -All questions again were solicited and answered to his satisfaction.  We again reviewed the risk, benefits of the above procedure. -Admit to inpatient service postoperatively.    [name], MD Cell [phone]    07/16/2018 2:21 PM

## 2018-07-16 NOTE — Anesthesia Procedure Notes (Addendum)
Procedure Name: Intubation Date/Time: 07/16/2018 2:42 PM Performed by: Elayne Snare, CRNA Pre-anesthesia Checklist: Patient identified, Emergency Drugs available, Suction available and Patient being monitored Patient Re-evaluated:Patient Re-evaluated prior to induction Oxygen Delivery Method: Circle System Utilized Preoxygenation: Pre-oxygenation with 100% oxygen Induction Type: IV induction Ventilation: Mask ventilation without difficulty Laryngoscope Size: Mac and 4 Grade View: Grade I Tube type: Oral Tube size: 7.5 mm Number of attempts: 1 Airway Equipment and Method: Stylet Placement Confirmation: ETT inserted through vocal cords under direct vision,  positive ETCO2 and breath sounds checked- equal and bilateral Secured at: 23 cm Tube secured with: Tape Dental Injury: Teeth and Oropharynx as per pre-operative assessment

## 2018-07-16 NOTE — Anesthesia Preprocedure Evaluation (Addendum)
Anesthesia Evaluation  Patient identified by MRN, date of birth, ID band Patient awake    Reviewed: Allergy & Precautions, H&P , NPO status , Patient's Chart, lab work & pertinent test results  Airway Mallampati: II  TM Distance: >3 FB Neck ROM: Full    Dental no notable dental hx. (+) Teeth Intact, Dental Advisory Given   Pulmonary neg pulmonary ROS,    Pulmonary exam normal breath sounds clear to auscultation       Cardiovascular hypertension, Pt. on medications  Rhythm:Regular Rate:Normal     Neuro/Psych  Headaches, Anxiety Depression  Neuromuscular disease    GI/Hepatic Neg liver ROS, GERD  Medicated,  Endo/Other  negative endocrine ROS  Renal/GU negative Renal ROS  negative genitourinary   Musculoskeletal  (+) Arthritis ,   Abdominal   Peds  Hematology negative hematology ROS (+)   Anesthesia Other Findings   Reproductive/Obstetrics negative OB ROS                            Anesthesia Physical Anesthesia Plan  ASA: II  Anesthesia Plan: General   Post-op Pain Management: GA combined w/ Regional for post-op pain   Induction: Intravenous  PONV Risk Score and Plan: 3 and Ondansetron, Dexamethasone and Midazolam  Airway Management Planned: Oral ETT  Additional Equipment: None  Intra-op Plan:   Post-operative Plan: Extubation in OR  Informed Consent: I have reviewed the patients History and Physical, chart, labs and discussed the procedure including the risks, benefits and alternatives for the proposed anesthesia with the patient or authorized representative who has indicated his/her understanding and acceptance.     Dental advisory given  Plan Discussed with: CRNA  Anesthesia Plan Comments:        Anesthesia Quick Evaluation

## 2018-07-16 NOTE — Anesthesia Postprocedure Evaluation (Signed)
Anesthesia Post Note  Patient: Kyce Ging [facility]  Procedure(s) Performed: Left shoulder irrigation and debridement with poly and head ball exchange (Left Shoulder)     Patient location during evaluation: PACU Anesthesia Type: General and Regional Level of consciousness: awake and alert Pain management: pain level controlled Vital Signs Assessment: post-procedure vital signs reviewed and stable Respiratory status: spontaneous breathing, nonlabored ventilation and respiratory function stable Cardiovascular status: blood pressure returned to baseline and stable Postop Assessment: no apparent nausea or vomiting Anesthetic complications: no    Last Vitals:  Vitals:   07/16/18 1750 07/16/18 1805  BP: 106/76 98/78  Pulse: 77 75  Resp: 19 15  Temp:  36.5 C  SpO2: 95% 94%    Last Pain:  Vitals:   07/16/18 1805  TempSrc:   PainSc: 0-No pain                 Charlee Squibb,W. EDMOND

## 2018-07-16 NOTE — Op Note (Signed)
07/16/2018   PATIENT:  Geoffrey West    PRE-OPERATIVE DIAGNOSIS:  Left shoulder infected prosthesis  POST-OPERATIVE DIAGNOSIS:  Same  PROCEDURE:  1. Excisional and non-excisional debridement of infected left shoulder prosthesis including skin, subcutaneous tissue, muscle, and fascia. 2.  REVERSE TOTAL SHOULDER REVISION  Polyethylene and glenospehere head ball, 2 components.  SURGEON:  [name], MD  ASSISTANT: None  ANESTHESIA:   General  ESTIMATED BLOOD LOSS: 500cc  PREOPERATIVE INDICATIONS:  Geoffrey West is a  63 y.o. male with a diagnosis of Left shoulder infected prosthesis.  He was diagnosed with infected reverse shoulder arthroplasty Late last year and subsequently went on to a 2 stage revision with reimplantation about 4 weeks ago.  After doing quite well for the first 2 weeks postoperatively he then developed acute onset of pain in about 3 weeks out.  He was followed up by our infectious disease colleagues where labs were ordered and found to have elevation of erythrocyte sedimentation rate and C-reactive protein.  That in addition to his acute change in pain were concerning for recurrent left shoulder prosthetic infection.  He presents today for debridement of the left shoulder as well as lavage with exchange of modular components.  The risks benefits and alternatives were discussed with the patient preoperatively including but not limited to the risks of infection, bleeding, nerve injury, cardiopulmonary complications, the need for revision surgery, dislocation, brachial plexus palsy, incomplete relief of pain, among others, and the patient was willing to proceed.  OPERATIVE IMPLANTS: Biomet  40 mm reverse shoulder arthroplasty tray with a  +3 mm polyethylene liner and a 40 mm glenosphere + 0  placed in the D position.  OPERATIVE FINDINGS:  On the approach there was abundant  Serous and serosanguineous fluid encountered once we get down to the  pseudocapsule.  He did also have invagination of his skin scar which had adhesions and 2 small pinhole areas in the epidermis that communicated with the outside.  As we assess the bony stability he had good bony on growth to both the humeral stem and the glenoid baseplate.  There were no signs of loosening.  There was no gross purulence encountered.  OPERATIVE PROCEDURE: The patient was brought to the operating room and placed in the supine position. General anesthesia was administered. IV antibiotics were given. A Foley was noit placed. Time out was performed. The upper extremity was prepped and draped in usual sterile fashion. The patient was in a beachchair position. Deltopectoral approach was carried out.  Dissection was carried down through previous scar tissue utilizing previously placed deltopectoral FiberWire sutures as points of reference.  We did ellipse out the previous scar sharply with a knife excising the concerning areas for sinus tract including epidermis, dermis and subcu tissue.  The coracoid process served as the proximal aspect of this approach.  We then encountered pseudocapsule that was opened.  at this point we did notice abundant serous and serosanguineous fluid that had no purulent aspects to it.  This was cultured and sent off the back table to the microbiology lab.  I then performed circumferential releases of the humerus, and then dislocated  The shoulder.  We then continued along with subdeltoid releases where there was abundant fibrotic adhesion from multiple previous surgeries.  Deep retractors were then placed.   we next were able to disengage the Geoffrey West taper of the humeral tray.  Once this was disengaged we encountered some biofilm on the trunnion of the proximal humeral  stem.  This was sharply excised with knife and rongeur.   Next we moved to disengage the glenoid sphere from the [facility] taper on the glenoid side.  This required some excisional debridement of the  pseudocapsule both superiorly and posteriorly.  This was accomplished utilizing knife and rongeur.  Of note we encountered some Ethibond suture that had apparently been placed at 1 of his previous surgeries years in the past.  This is not something that I had placed.  This is certainly a potential source of recurrent infection.  We were successfully able to disengage the glenoid sphere.  At that juncture we again encountered biofilm overlying the glenoid baseplate.  This was again sharply excised with knife and rongeur.   With modular components removing then moved to 10 the excisional and non-excisional debridement.  We lavaged the wound with 3 L of normal saline.  We then again sharply excised skin, subcutaneous tissue, muscle, and pseudo-capsule as well as biofilm as able with knife and rongeur as well as Bovie.  We then lavaged the wound with a liter of a Biomet antibacterial slurry.  This was then lavaged out with 1 L of normal saline.   Once the excisional and non-excisional debridement was complete we assessed for stability of components.  The humeral stem as well as the glenoid baseplate were quite stable.  We then moved to reimplant the previously removed components but with brand-new parts.  The glenoid sphere was placed again in the deep position to inferiorize the glenoid head.  This was impacted into place and stable.    We then moved back to the humeral side.  We placed a 40 mm standard tray.  We then trialed this with the standard polyethylene liner.  This was noted to have a little bit of a shock and was easily dislocated.  Therefore, we elected to go with the +3 polyethylene liner.  The final implants wereplaced together on the back table and impacted into place.  They were stable.  The shoulder was reduced and stable through all range of motion with no instability.  We did also perform a lavage of the wound with hydrogen peroxide.  We then once again copiously irrigated with normal  saline.   Prior to closure we placed a Jackson-Pratt drain in the deep space of the deltopectoral interval.  This was sewn into place along the lateral deltoid.  We then placed 1 g of vancomycin powder in the wound.   Next we did a scar revision by elevating the dermis on both medial and lateral aspect of the deltopectoral incision with Bovie to create mobile skin flaps.   The deltopectoral interval was closed with interrupted PDS 0.  The subcutaneous tissue was closed with 2-0 Monocryl.  The skin was reapproximated with interrupted 3-0 nylon horizontal mattress.  We then placed a negative pressure VAC assisted closure device as an incisional VAC.   IV vancomycin was given in the operative theater.  This was initiated after cultures were taken.  All counts were correct x2.  There were no intraoperative complications noted.  Patient was transported to PACU in stable condition.  Disposition: Geoffrey West will be admitted to the orthopedic floor for routine postoperative occupational and physical therapy.  He will be nonweightbearing other than up to 2 pounds with the left arm.  He will maintain sling other than when doing activities of daily living or therapy.  He will take aspirin once daily for 6 weeks for DVT prophylaxis.  We will  follow up on his intraoperative cultures.  We will have the infectious disease folks see him while on the floor and likely initiate a PICC line.  He will continue vancomycin while we await their final recommendations.

## 2018-07-16 NOTE — Anesthesia Procedure Notes (Signed)
Anesthesia Regional Block: Interscalene brachial plexus block   Pre-Anesthetic Checklist: ,, timeout performed, Correct Patient, Correct Site, Correct Laterality, Correct Procedure, Correct Position, site marked, Risks and benefits discussed, pre-op evaluation,  At surgeon's request and post-op pain management  Laterality: Left  Prep: Maximum Sterile Barrier Precautions used, chloraprep       Needles:  Injection technique: Single-shot  Needle Type: Echogenic Stimulator Needle     Needle Length: 5cm  Needle Gauge: 22     Additional Needles:   Procedures:,,,, ultrasound used (permanent image in chart),,,,   Nerve Stimulator or Paresthesia:  Response: Biceps response,   Additional Responses:   Narrative:  Start time: 07/16/2018 1:44 PM End time: 07/16/2018 1:54 PM Injection made incrementally with aspirations every 5 mL. Anesthesiologist: Roderic Palau, MD  Additional Notes: 2% Lidocaine skin wheel.

## 2018-07-16 NOTE — Brief Op Note (Signed)
07/16/2018  5:04 PM  PATIENT:  Geoffrey West  63 y.o. male  PRE-OPERATIVE DIAGNOSIS:  Left shoulder prostetic joint infection  POST-OPERATIVE DIAGNOSIS:  Left shoulder prostetic joint infection  PROCEDURE:  Procedure(s) with comments: Left shoulder irrigation and debridement with poly and head ball exchange (Left) - 2 hrs  SURGEON:  Surgeon(s) and Role:    * [name], MD - Primary  PHYSICIAN ASSISTANT:   ASSISTANTS: none   ANESTHESIA:   regional and general  EBL:  500 mL   BLOOD ADMINISTERED:none  DRAINS: (1) Jackson-Pratt drain(s) with closed bulb suction in the left shoulder   LOCAL MEDICATIONS USED:  NONE  SPECIMEN:  No Specimen  DISPOSITION OF SPECIMEN:  N/A  COUNTS:  YES  TOURNIQUET:  * No tourniquets in log *  DICTATION: .Note written in EPIC  PLAN OF CARE: Admit to inpatient   PATIENT DISPOSITION:  PACU - hemodynamically stable.   Delay start of Pharmacological VTE agent (>24hrs) due to surgical blood loss or risk of bleeding: not applicable

## 2018-07-16 NOTE — Consult Note (Signed)
Patient is here for debridement by Dr. [name] today.  Has a history of P acnes infection.   To OR today and will see patient inpatient and do consult tomorrow. Can use IV ceftriaxone 2 grams once daily after surgery.    Thayer Headings, MD

## 2018-07-16 NOTE — Transfer of Care (Signed)
Immediate Anesthesia Transfer of Care Note  Patient: Geoffrey West [facility]  Procedure(s) Performed: Left shoulder irrigation and debridement with poly and head ball exchange (Left Shoulder)  Patient Location: PACU  Anesthesia Type:GA combined with regional for post-op pain  Level of Consciousness: awake, alert , oriented and patient cooperative  Airway & Oxygen Therapy: Patient Spontanous Breathing and Patient connected to face mask oxygen  Post-op Assessment: Report given to RN and Post -op Vital signs reviewed and stable  Post vital signs: Reviewed and stable  Last Vitals:  Vitals Value Taken Time  BP 110/79 07/16/2018  5:08 PM  Temp    Pulse 73 07/16/2018  5:11 PM  Resp 14 07/16/2018  5:11 PM  SpO2 100 % 07/16/2018  5:11 PM  Vitals shown include unvalidated device data.  Last Pain:  Vitals:   07/16/18 1708  TempSrc:   PainSc: (P) Asleep      Patients Stated Pain Goal: 3 (59/[id number]/85 [id number])  Complications: No apparent anesthesia complications

## 2018-07-17 ENCOUNTER — Encounter (HOSPITAL_COMMUNITY): Payer: Self-pay | Admitting: Orthopedic Surgery

## 2018-07-17 ENCOUNTER — Inpatient Hospital Stay: Payer: Self-pay

## 2018-07-17 DIAGNOSIS — Z978 Presence of other specified devices: Secondary | ICD-10-CM

## 2018-07-17 DIAGNOSIS — Z885 Allergy status to narcotic agent status: Secondary | ICD-10-CM

## 2018-07-17 DIAGNOSIS — T8459XA Infection and inflammatory reaction due to other internal joint prosthesis, initial encounter: Principal | ICD-10-CM

## 2018-07-17 DIAGNOSIS — Z96612 Presence of left artificial shoulder joint: Secondary | ICD-10-CM

## 2018-07-17 LAB — BASIC METABOLIC PANEL
ANION GAP: 9 (ref 5–15)
BUN: 27 mg/dL — ABNORMAL HIGH (ref 8–23)
CO2: 25 mmol/L (ref 22–32)
Calcium: 8.9 mg/dL (ref 8.9–10.3)
Chloride: 103 mmol/L (ref 98–111)
Creatinine, Ser: 1.52 mg/dL — ABNORMAL HIGH (ref 0.61–1.24)
GFR calc Af Amer: 56 mL/min — ABNORMAL LOW (ref >60)
GFR calc non Af Amer: 49 mL/min — ABNORMAL LOW (ref >60)
Glucose, Bld: 166 mg/dL — ABNORMAL HIGH (ref 70–99)
Potassium: 4 mmol/L (ref 3.5–5.1)
Sodium: 137 mmol/L (ref 135–145)

## 2018-07-17 LAB — HEMOGLOBIN AND HEMATOCRIT, BLOOD
HCT: 41.6 % (ref 39.0–52.0)
Hemoglobin: 13.7 g/dL (ref 13.0–17.0)

## 2018-07-17 MED ORDER — SODIUM CHLORIDE 0.9 % IV SOLN
2.0000 g | INTRAVENOUS | Status: DC
  Administered 2018-07-17 – 2018-07-18 (×2): 2 g via INTRAVENOUS
  Filled 2018-07-17 (×2): qty 20

## 2018-07-17 NOTE — Consult Note (Signed)
[facility] for Infectious Disease       Reason for Consult: PJI    Referring Physician: Dr. 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 acetaminophen  500 mg Oral Q6H  . allopurinol  300 mg Oral Daily  . amLODipine  10 mg Oral QHS  . aspirin EC  325 mg Oral Daily  . docusate sodium  100 mg Oral BID  . famotidine  20 mg Oral Daily  . gabapentin  300 mg Oral TID  . ketorolac  7.5 mg Intravenous Q6H  . magnesium oxide  400 mg Oral Daily  . pantoprazole  20 mg Oral QPM  . propranolol ER  80 mg Oral QPM  . traMADol  50 mg Oral Q6H    Recommendations: picc line (ordered) Ceftriaxone 2 grams once daily Treatment for 4-6 weeks Home health  Has follow up with Dr. 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 Taken to OR and washed out.  Cultures pending.   CRP significantly elevated, ESR moderately elevated.   He is very hesitant to get a picc line due to anxiety with having it in.  Last picc he had removed in the ED in July.  He though is willing to proceed for now.    Antibiotics: Vancomycin Has been on Keflex  HPI: Geoffrey West is a 64 y.o. male with a history of lefts shoulder arthroplasty last year with infection with P acnes and required 2-stage revision with reimplantation last month.  He went back to Dr. [name] with acute onset of pain. Went back to the OR yesterday for washout and cultures pending. No associated fever or rash.     Review of Systems:  Constitutional: negative for fevers, chills and anorexia Gastrointestinal: negative for nausea and diarrhea Integument/breast: negative for rash All other systems reviewed and are negative    Past Medical History:  Diagnosis Date  . Anxiety   .  Arthritis    "all over" (03/06/2018)  . GERD (gastroesophageal reflux disease)   . Gout    "on daily RX" (03/06/2018)  . Hypertension   . Hypertension 12/25/2016  . Low testosterone 12/25/2016  . Migraine    "haven't had more than 1/year since propanolol started" (03/06/2018)  . 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 Situational depression    "recently lost my mom and brother" (03/06/2018)    Social History   Tobacco Use  . Smoking status: Never Smoker  . Smokeless tobacco: Never Used  Substance Use Topics  . Alcohol use: Not Currently    Comment: 03/06/2018 "max of 2 drinks/year; if that"  . Drug use: Never    Family History  Problem Relation Age of Onset  . Arthritis Mother   . Arthritis Sister   . Alzheimer's disease Brother   . Arthritis Brother     Allergies  Allergen Reactions  . Fentanyl Shortness Of Breath and Other (See Comments)    Reaction to [facility] ONLY > ? DOSE REGULATION ?   [name] Oxycodone Other (See Comments)    Pt states he goes through hot and cold flashes withdrawal like symptoms    Physical Exam: Constitutional: in no apparent distress  Vitals:   07/17/18 0324 07/17/18 0742  BP:  101/69 107/62  Pulse: 68 61  Resp: 18 16  Temp: 97.8 F (36.6 C) 97.6 F (36.4 C)  SpO2: 95% 95%   EYES: anicteric ENMT: no thrush Cardiovascular: Cor RRR Respiratory: CTA; normal respiratory effort Musculoskeletal: shoulder in sling, + drain Skin: no rash Neuro: non focal  Lab Results  Component Value Date   WBC 10.3 07/09/2018   HGB 13.7 07/17/2018   HCT 41.6 07/17/2018   MCV 93.1 07/09/2018   PLT 232 07/09/2018    Lab Results  Component Value Date   CREATININE 1.52 (H) 07/17/2018   BUN 27 (H) 07/17/2018   NA 137 07/17/2018   K 4.0 07/17/2018   CL 103 07/17/2018   CO2 25 07/17/2018    Lab Results  Component Value Date   ALT 19 02/06/2017   AST 21 02/06/2017   ALKPHOS 75 02/06/2017     Microbiology: Recent Results (from the past 240  hour(s))  Body fluid culture     Status: None (Preliminary result)   Collection Time: 07/16/18  3:19 PM  Result Value Ref Range Status   Specimen Description FLUID LEFT SHOULDER  Final   Special Requests HOLD 14DAYS FOR SHOULDER INFECTION SWABS  Final   Gram Stain   Final    NO WBC SEEN NO ORGANISMS SEEN Performed at [facility], [address], [place], [place] [address]    Culture PENDING  Incomplete   Report Status PENDING  Incomplete  Anaerobic culture     Status: None (Preliminary result)   Collection Time: 07/16/18  3:19 PM  Result Value Ref Range Status   Specimen Description FLUID LEFT SHOULDER  Final   Special Requests HOLD 14 DAYS FOR SHOULDER INFECTION SWABS  Final   Gram Stain PENDING  Incomplete   Culture PENDING  Incomplete   Report Status PENDING  Incomplete    Thayer Headings, [place] for Infectious [facility] [email or web address] 07/17/2018, 9:22 AM

## 2018-07-17 NOTE — Evaluation (Signed)
Occupational Therapy Evaluation Patient Details Name: Geoffrey West MRN: [id number] DOB: Dec 10, 1955 Today's Date: 07/17/2018    History of Present Illness Pt is a 63 y/o male with recent L reverse TSA on 06/11/2018. Pt presented with L shoulder prostetic joint infection, now s/p L shoulder I&D with poly and head ball exchange (reverse TSA revision). Of note, pt with multiple previous L TSA including 12/2017, 11/2016. PMHx includes anxiety, HTN   Clinical Impression   This 63 y/o male presents with the above. At baseline pt reports he is independent with ADL and functional mobility. Pt still feeling nerve block at time of OT eval. Reviewed and further educated on shoulder precautions, HEP, safety and compensatory strategies for performing ADL and functional transfers given current LUE limitations. Pt verbalizing and return demonstrating understanding with min cues. He currently requires minA for UB ADL including sling management. Pt lives alone and reports will only have intermittent assist at time of discharge. Pt will benefit from continued OT services to maximize his overall safety and independence with ADL and mobility while adhering to shoulder precautions prior to discharge home. Will continue to follow acutely.     Follow Up Recommendations  Follow surgeon's recommendation for DC plan and follow-up therapies;Supervision - Intermittent    Equipment Recommendations  None recommended by OT           Precautions / Restrictions Precautions Precautions: Shoulder;Other (comment)(pt with jp drain and wound vac L shoulder) Type of Shoulder Precautions: okay for ROM for hand, wrist, and elbow to pt tolerance. FF 0-90. ER 0-30. ABD 0-60.  Shoulder Interventions: Shoulder sling/immobilizer;Off for dressing/bathing/exercises(per order set, wear for comfort/sleep) Precaution Booklet Issued: Yes (comment) Precaution Comments: Provided education and handout on shoulder protocal, exercises, ROM  limitations, and compensatory techniques for ADLs.  Required Braces or Orthoses: Sling Restrictions Weight Bearing Restrictions: Yes LUE Weight Bearing: Non weight bearing      Mobility Bed Mobility               General bed mobility comments: OOB in recliner upon arrival  Transfers Overall transfer level: Independent                    Balance Overall balance assessment: No apparent balance deficits (not formally assessed)                                         ADL either performed or assessed with clinical judgement   ADL Overall ADL's : Needs assistance/impaired Eating/Feeding: Independent;Sitting   Grooming: Supervision/safety;Set up;Standing   Upper Body Bathing: Supervision/ safety;Set up;Sitting   Lower Body Bathing: Supervison/ safety;Min guard;Sit to/from stand   Upper Body Dressing : Minimal assistance;Sitting Upper Body Dressing Details (indicate cue type and reason): reviewed compensatory strategies for UB dressing and sling management; minA for sling management Lower Body Dressing: Min guard;Sit to/from stand   Toilet Transfer: Supervision/safety           Functional mobility during ADLs: Supervision/safety General ADL Comments: reviewed and provided further education on shoulder precautions, safety and compensatory strategies for sling management, performing ADL and functional transfers; pt still feeling nerve block this AM     Vision         Perception     Praxis      Pertinent Vitals/Pain Pain Assessment: Faces Faces Pain Scale: Hurts a little bit Pain Location: Left shoulder Pain Descriptors /  Indicators: Guarding(still feeling nerve block) Pain Intervention(s): Limited activity within patient's tolerance;Monitored during session;Repositioned     Hand Dominance Right   Extremity/Trunk Assessment Upper Extremity Assessment Upper Extremity Assessment: LUE deficits/detail LUE Deficits / Details: s/p Left  reverse TSA and I&D LUE Sensation: decreased light touch LUE Coordination: decreased gross motor   Lower Extremity Assessment Lower Extremity Assessment: Overall WFL for tasks assessed   Cervical / Trunk Assessment Cervical / Trunk Assessment: Normal   Communication Communication Communication: No difficulties   Cognition Arousal/Alertness: Awake/alert Behavior During Therapy: WFL for tasks assessed/performed Overall Cognitive Status: Within Functional Limits for tasks assessed                                     General Comments       Exercises Exercises: Shoulder Shoulder Exercises Shoulder Flexion: PROM;Self ROM;Left;Seated;5 reps(0-90, performed 5 due to still feeling nerve block) Shoulder Extension: PROM;Self ROM;Left;Seated;5 reps(0-90) Shoulder External Rotation: PROM;Self ROM;Left;Seated;5 reps(0-30, performed 5 due to still feeling nerve block) Elbow Flexion: (reviewed, did not attempt today due to nerve block) Elbow Extension: (reviewed, did not attempt today due to nerve block) Wrist Flexion: AROM;Left;Seated;10 reps Wrist Extension: AROM;Left;10 reps;Seated Digit Composite Flexion: AROM;Left;10 reps;Seated Composite Extension: AROM;Both;10 reps;Seated Neck Flexion: AROM;Seated Neck Extension: AROM;Seated Neck Lateral Flexion - Right: AROM;Seated Neck Lateral Flexion - Left: AROM;Seated   Shoulder Instructions Shoulder Instructions Donning/doffing shirt without moving shoulder: Minimal assistance Method for sponge bathing under operated UE: Supervision/safety Donning/doffing sling/immobilizer: Minimal assistance Correct positioning of sling/immobilizer: Supervision/safety ROM for elbow, wrist and digits of operated UE: Supervision/safety Sling wearing schedule (on at all times/off for ADL's): Supervision/safety Proper positioning of operated UE when showering: Supervision/safety Positioning of UE while sleeping: Supervision/safety    Home  Living Family/patient expects to be discharged to:: Private residence Living Arrangements: Alone Available Help at Discharge: Family;Available PRN/intermittently(reports will "mostly be on his own") Type of Home: House Home Access: Stairs to enter     Home Layout: One level     Bathroom Shower/Tub: Occupational psychologist: Standard     Home Equipment: None   Additional Comments: reports Plans to get a shower seat, however noted pt stated this during last admit also, pt reports has been standing to shower after previous surgeries      Prior Functioning/Environment Level of Independence: Independent        Comments: Retired Press photographer        OT Problem List: Decreased strength;Decreased range of motion;Decreased activity tolerance;Decreased knowledge of use of DME or AE;Decreased knowledge of precautions;Impaired UE functional use      OT Treatment/Interventions: Self-care/ADL training;Therapeutic exercise;DME and/or AE instruction;Patient/family education;Balance training;Therapeutic activities    OT Goals(Current goals can be found in the care plan section) Acute Rehab OT Goals Patient Stated Goal: regain independence  OT Goal Formulation: With patient Time For Goal Achievement: 07/31/18 Potential to Achieve Goals: Good  OT Frequency: Min 2X/week   Barriers to D/C:            Co-evaluation              AM-PAC OT "6 Clicks" Daily Activity     Outcome Measure Help from another person eating meals?: None Help from another person taking care of personal grooming?: None Help from another person toileting, which includes using toliet, bedpan, or urinal?: None Help from another person bathing (including washing, rinsing, drying)?: None Help from  another person to put on and taking off regular upper body clothing?: A Little Help from another person to put on and taking off regular lower body clothing?: None 6 Click Score: 23   End of Session Equipment  Utilized During Treatment: Other (comment)(sling) Nurse Communication: Mobility status;Precautions;Weight bearing status  Activity Tolerance: Patient tolerated treatment well Patient left: with call bell/phone within reach;in chair(sitting at EOB)  OT Visit Diagnosis: Other abnormalities of gait and mobility (R26.89);Muscle weakness (generalized) (M62.81)                Time: 0740-9796 OT Time Calculation (min): 23 min Charges:  OT General Charges $OT Visit: 1 Visit OT Evaluation $OT Eval Moderate Complexity: [place], OT [employer] Pager [phone] Office [place] 07/17/2018, 1:08 PM

## 2018-07-17 NOTE — Progress Notes (Addendum)
   Subjective:  Patient reports pain as mild.  Doing very well currently with no specific complaints.  His block is wearing off distally but still unable to move proximally around the shoulder.  No pain.  No fevers or night sweats.  No nausea, vomiting, shortness of breath, or chest pain.  He continues to be a bit anxious about the PICC line but overall ready to proceed with that.  Objective:   VITALS:   Vitals:   07/16/18 2339 07/17/18 0324 07/17/18 0742 07/17/18 1159  BP: 112/80 101/69 107/62 99/62  Pulse: 73 68 61 62  Resp: 18 18 16 16   Temp: 97.7 F (36.5 C) 97.8 F (36.6 C) 97.6 F (36.4 C) 97.6 F (36.4 C)  TempSrc: Oral Oral Oral Oral  SpO2: 97% 95% 95% 98%  Weight:      Height:          Left shoulder:  JP in place with serosanguineous drainage in bulb. Wound VAC in place at the incision with good seal.  No drainage noted.  The arm is warm and well-perfused with good 2+ radial pulse.  Distally he is neurovascular intact.  About the shoulder he continues with decreased sensation along the deltoid region as well as inability to move through the shoulder.  Motor intact in the elbow hand and wrist.  Lab Results  Component Value Date   WBC 10.3 07/09/2018   HGB 13.7 07/17/2018   HCT 41.6 07/17/2018   MCV 93.1 07/09/2018   PLT 232 07/09/2018   BMET    Component Value Date/Time   NA 137 07/17/2018 0502   K 4.0 07/17/2018 0502   CL 103 07/17/2018 0502   CO2 25 07/17/2018 0502   GLUCOSE 166 (H) 07/17/2018 0502   BUN 27 (H) 07/17/2018 0502   CREATININE 1.52 (H) 07/17/2018 0502   CREATININE 1.49 (H) 07/09/2018 1415   CALCIUM 8.9 07/17/2018 0502   GFRNONAA 49 (L) 07/17/2018 0502   GFRNONAA 50 (L) 07/09/2018 1415   GFRAA 56 (L) 07/17/2018 0502   GFRAA 58 (L) 07/09/2018 1415     Assessment/Plan: 1 Day Post-Op   Principal Problem:   Prosthetic shoulder infection (HCC) Active Problems:   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  He may remove the sling as needed for daily range of motion exercises and activities of daily living.  Sling for comfort only.  -I greatly appreciate infectious disease input with Geoffrey West.  At this time he is scheduled to have this pick placed and will continue on with Rocephin per their recommendations for 4 to 6 weeks.  -We will continue to follow up on cultures  -We will plan on discharging him likely tomorrow afternoon.  We will maintain his JP drain until tomorrow.  Otherwise he will go home with the [place] incisional VAC that will be taken off 7 days postoperatively.     Geoffrey West 07/17/2018, 1:58 PM   Geoffrey Rile, MD [phone]

## 2018-07-17 NOTE — Progress Notes (Signed)
[facility] Infusion Coordinator will follow pt with ID team to support home infusion pharmacy services at DC for home IV ABX if ordered/needed.    If patient discharges after hours, please call [phone].   Larry Sierras 07/17/2018, 7:56 AM

## 2018-07-18 ENCOUNTER — Telehealth: Payer: Self-pay | Admitting: *Deleted

## 2018-07-18 MED ORDER — ALPRAZOLAM 0.25 MG PO TABS: 0.2500 mg | ORAL_TABLET | Freq: Three times a day (TID) | ORAL | 0 refills | Status: DC | PRN

## 2018-07-18 MED ORDER — SODIUM CHLORIDE 0.9% FLUSH: 10.0000 mL | Freq: Two times a day (BID) | INTRAVENOUS | Status: DC

## 2018-07-18 MED ORDER — TRAMADOL HCL 50 MG PO TABS: 50.0000 mg | ORAL_TABLET | Freq: Four times a day (QID) | ORAL | 0 refills | Status: DC | PRN

## 2018-07-18 MED ORDER — SODIUM CHLORIDE 0.9% FLUSH: 10.0000 mL | INTRAVENOUS | Status: DC | PRN

## 2018-07-18 MED ORDER — CEFTRIAXONE IV (FOR PTA / DISCHARGE USE ONLY): 2.0000 g | INTRAVENOUS | 0 refills | Status: AC

## 2018-07-18 NOTE — Progress Notes (Signed)
   Subjective:  Patient reports pain as mild.  .  No fevers or night sweats.  No nausea, vomiting, shortness of breath, or chest pain.  PICC line in .  Objective:   VITALS:   Vitals:   07/17/18 2318 07/18/18 0407 07/18/18 0732 07/18/18 1225  BP: 126/81 114/80 114/81 (!) 110/95  Pulse: 68 (!) 56 (!) 52 (!) 102  Resp: 20 20 17 19   Temp: 97.6 F (36.4 C) 97.6 F (36.4 C)    TempSrc: Oral Oral    SpO2: 100% 97% 97% 100%  Weight:      Height:          Left shoulder:  JP removed. Wound VAC in place at the incision with good seal.  No drainage noted.  The arm is warm and well-perfused with good 2+ radial pulse.  neurovascular intact.    Lab Results  Component Value Date   WBC 10.3 07/09/2018   HGB 13.7 07/17/2018   HCT 41.6 07/17/2018   MCV 93.1 07/09/2018   PLT 232 07/09/2018   BMET    Component Value Date/Time   NA 137 07/17/2018 0502   K 4.0 07/17/2018 0502   CL 103 07/17/2018 0502   CO2 25 07/17/2018 0502   GLUCOSE 166 (H) 07/17/2018 0502   BUN 27 (H) 07/17/2018 0502   CREATININE 1.52 (H) 07/17/2018 0502   CREATININE 1.49 (H) 07/09/2018 1415   CALCIUM 8.9 07/17/2018 0502   GFRNONAA 49 (L) 07/17/2018 0502   GFRNONAA 50 (L) 07/09/2018 1415   GFRAA 56 (L) 07/17/2018 0502   GFRAA 58 (L) 07/09/2018 1415     Assessment/Plan: 2 Days Post-Op   Principal Problem:   Prosthetic shoulder infection ([place]) Active Problems:   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  He may remove the sling as needed for daily range of motion exercises and activities of daily living.  Sling for comfort only.  -I greatly appreciate infectious disease input with Mr. Debois.  At this time he is scheduled to have this pick placed and will continue on with Rocephin per their recommendations for 4 to 6 weeks.    -We will continue to follow up on cultures  - dc home today with [employer] [facility] [employer] for IV  abx.     [name] 07/18/2018, 12:32 PM   Geralynn Rile, MD [phone]

## 2018-07-18 NOTE — Telephone Encounter (Signed)
Per Dr Henreitta Leber note, will keep appointment on 1/28 4:15 with Dr Tommy Medal.  Patient agreed. Landis Gandy, RN

## 2018-07-18 NOTE — Telephone Encounter (Signed)
-----   Message from [place] sent at 07/18/2018 11:48 AM EST ----- Regarding: ?reschedule Contact: self Patient called because he received call to confirm appt and he wanted to make sure he needed that appt as he just got surgery two days ago.   [phone]

## 2018-07-18 NOTE — Progress Notes (Signed)
PHARMACY CONSULT NOTE FOR:  OUTPATIENT  PARENTERAL ANTIBIOTIC THERAPY (OPAT)  Indication: Prosthetic shoulder infection  Regimen: Ceftriaxone 2g Q24H End date: August 27, 2018  IV antibiotic discharge orders are pended. To discharging provider:  please sign these orders via discharge navigator,  Select New Orders & click on the button choice - Manage This Unsigned Work.     Thank you for allowing pharmacy to be a part of this patient's care.  Harrietta Guardian, PharmD PGY1 Pharmacy Resident 07/18/2018    9:57 AM Please check AMION for all [place] numbers

## 2018-07-18 NOTE — Progress Notes (Signed)
Pt doing well. Pt given D/C instructions with verbal understanding. Pt's incision is clean and dry with wound vac in place. Pt D/C'd home with PICC line and wound vac per MD order. Pt's home health was arranged by CM prior to D/C. Pt D/C'd home via walking per MD order. Pt is stable @ D/C and has no other needs at this time. Holli Humbles, RN

## 2018-07-18 NOTE — Care Management Note (Signed)
Case Management Note  Patient Details  Name: Geoffrey West MRN: [id number] Date of Birth: [birth year]  Subjective/Objective:   63 yr old gentleman admitted with left shoulder prosthesis. Patient underwent I & D and left reverse shoulder revision.             Action/Plan: Patient will discharge home with PICC line for IV Ceftriaxone 2 grams daily for 4-6 weeks. CM spoke with Carolynn Sayers, [place] IV antibiotic Specialist, and Neoma Laming, [facility] Liaison. Patient has had PICC for IV antibiotics in the past.    Expected Discharge Date:  07/18/18               Expected Discharge Plan:  [place]  In-House Referral:  NA  Discharge planning Services  CM Consult  Post Acute Care Choice:  Home Health Choice offered to:  Patient  DME Arranged:  N/A DME Agency:  NA  HH Arranged:  RN IV Antibiotics HH Agency:  [place]  Status of Service:  Completed, signed off  If discussed at [place] of Stay Meetings, dates discussed:    Additional Comments:  Ninfa Meeker, RN 07/18/2018, 12:41 PM

## 2018-07-18 NOTE — Progress Notes (Signed)
Occupational Therapy Treatment Patient Details Name: Geoffrey West MRN: [id number] DOB: May 25, 1956 Today's Date: 07/18/2018    History of present illness Pt is a 63 y/o male with recent L reverse TSA on 06/11/2018. Pt presented with L shoulder prostetic joint infection, now s/p L shoulder I&D with poly and head ball exchange (reverse TSA revision). Of note, pt with multiple previous L TSA including 12/2017, 11/2016. PMHx includes anxiety, HTN   OT comments  Pt with good progress towards OT goals, with better control of UE today as nerve block as worn off. Pt demonstrates HEP with min cues throughout. Verbalizes technique for UB ADL and demonstrates sling management with min cues to maintain shoulder precautions. Further reviewed shoulder precautions, safety and compensatory strategy for performing ADL with pt verbalizing understanding. Will continue to follow while pt remains in acute setting to maximize his overall safety and independence with ADL and mobility prior to discharge.    Follow Up Recommendations  Follow surgeon's recommendation for DC plan and follow-up therapies;Supervision - Intermittent    Equipment Recommendations  None recommended by OT          Precautions / Restrictions Precautions Precautions: Shoulder;Other (comment)(pt with jp drain and wound vac L shoulder) Type of Shoulder Precautions: okay for ROM for hand, wrist, and elbow to pt tolerance. FF 0-90. ER 0-30. ABD 0-60.  Shoulder Interventions: Shoulder sling/immobilizer;Off for dressing/bathing/exercises(per order set, wear for comfort/sleep) Precaution Booklet Issued: Yes (comment) Precaution Comments: Provided education and handout on shoulder protocal, exercises, ROM limitations, and compensatory techniques for ADLs.  Required Braces or Orthoses: Sling Restrictions Weight Bearing Restrictions: Yes LUE Weight Bearing: Non weight bearing       Mobility Bed Mobility               General bed  mobility comments: OOB standing upon arrival  Transfers Overall transfer level: Independent                    Balance Overall balance assessment: No apparent balance deficits (not formally assessed)                                         ADL either performed or assessed with clinical judgement   ADL Overall ADL's : Needs assistance/impaired                 Upper Body Dressing : Supervision/safety;Sitting Upper Body Dressing Details (indicate cue type and reason): pt verbalizing proper technique for UB dressing; demonstrates doffing/donning sling with min cues for maintaining shoulder precautions                 Functional mobility during ADLs: Supervision/safety General ADL Comments: reviewed HEP, safety and compensatory strategies for performing ADL after return home given shoulder precautions     Vision       Perception     Praxis      Cognition Arousal/Alertness: Awake/alert Behavior During Therapy: WFL for tasks assessed/performed Overall Cognitive Status: Within Functional Limits for tasks assessed                                          Exercises Exercises: Shoulder;Hand exercises Shoulder Exercises Shoulder Flexion: PROM;Self ROM;Left;Seated;10 reps(0-90*) Shoulder External Rotation: PROM;Self ROM;Left;10 reps(0-30*) Elbow Flexion: AROM;Left;10 reps;Standing Elbow Extension: AROM;10 reps;Left;Standing(reviewed,  did not attempt today due to nerve block) Wrist Flexion: AROM;Left;10 reps Wrist Extension: AROM;Left;10 reps Digit Composite Flexion: AROM;Left;10 reps Composite Extension: AROM;Both;10 reps Neck Flexion: AROM Neck Extension: AROM Neck Lateral Flexion - Right: AROM Neck Lateral Flexion - Left: AROM Hand Exercises Forearm Supination: AROM;10 reps;Left Forearm Pronation: AROM;10 reps;Left   Shoulder Instructions Shoulder Instructions Donning/doffing shirt without moving shoulder:  Supervision/safety Method for sponge bathing under operated UE: Supervision/safety Donning/doffing sling/immobilizer: Supervision/safety Correct positioning of sling/immobilizer: Supervision/safety ROM for elbow, wrist and digits of operated UE: Supervision/safety Sling wearing schedule (on at all times/off for ADL's): Supervision/safety Proper positioning of operated UE when showering: Supervision/safety Positioning of UE while sleeping: Supervision/safety     General Comments      Pertinent Vitals/ Pain       Pain Assessment: No/denies pain  Home Living                                          Prior Functioning/Environment              Frequency  Min 2X/week        Progress Toward Goals  OT Goals(current goals can now be found in the care plan section)  Progress towards OT goals: Progressing toward goals  Acute Rehab OT Goals Patient Stated Goal: regain independence  OT Goal Formulation: With patient Time For Goal Achievement: 07/31/18 Potential to Achieve Goals: Good  Plan Discharge plan remains appropriate    Co-evaluation                 AM-PAC OT "6 Clicks" Daily Activity     Outcome Measure   Help from another person eating meals?: None Help from another person taking care of personal grooming?: None Help from another person toileting, which includes using toliet, bedpan, or urinal?: None Help from another person bathing (including washing, rinsing, drying)?: None Help from another person to put on and taking off regular upper body clothing?: None Help from another person to put on and taking off regular lower body clothing?: None 6 Click Score: 24    End of Session Equipment Utilized During Treatment: Other (comment)(sling)  OT Visit Diagnosis: Other abnormalities of gait and mobility (R26.89);Muscle weakness (generalized) (M62.81)   Activity Tolerance Patient tolerated treatment well   Patient Left with call bell/phone  within reach;in chair(sitting at [facility])   Nurse Communication Mobility status;Precautions;Weight bearing status        Time: 9937-1696 OT Time Calculation (min): 15 min  Charges: OT General Charges $OT Visit: 1 Visit OT Treatments $Therapeutic Activity: 8-22 mins  Geoffrey West, OT [facility] Pager [phone] Office [phone]    Geoffrey West 07/18/2018, 9:06 AM

## 2018-07-18 NOTE — Discharge Summary (Signed)
Patient ID: Geoffrey West MRN: [id number] DOB/AGE: [birth year] 63 y.o.  Admit date: 07/16/2018 Discharge date: 07/18/2018  Primary Diagnosis: Infected shoulder prosthesis, left. Admission Diagnoses:  Past Medical History:  Diagnosis Date  . Anxiety   . Arthritis    "all over" (03/06/2018)  . GERD (gastroesophageal reflux disease)   . Gout    "on daily RX" (03/06/2018)  . Hypertension   . Hypertension 12/25/2016  . Low testosterone 12/25/2016  . Migraine    "haven't had more than 1/year since propanolol started" (03/06/2018)  . 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   Weight as of this encounter: 95.3 kg.  Procedure:  Procedure(s) (LRB): Left shoulder irrigation and debridement with poly and head ball exchange (Left)   Consults: Infectious disease  HPI:  Geoffrey West presented to the hospital for semiurgent irrigation and debridement as well as modular component exchange of his left reverse shoulder arthroplasty for acute postoperative prosthetic joint infection.  He has a complicated history including 5 previous open left shoulder surgeries that resulted in infection and a stage II revision.  Unfortunately he developed acute pain and swelling following a reimplantation.  There was concern for reinfection and was urgently placed on the operative schedule for debridement and modular component exchange.   Laboratory Data: Admission on 07/16/2018, Discharged on 07/18/2018  Component Date Value Ref Range Status  . Glucose-Capillary 07/16/2018 92  70 - 99 mg/dL Final  . Glucose-Capillary 07/16/2018 94  70 - 99 mg/dL Final  .  Specimen Description 07/16/2018 FLUID LEFT SHOULDER   Final  . Special Requests 07/16/2018 HOLD 14DAYS FOR SHOULDER INFECTION SWABS   Final  . Gram Stain 07/16/2018    Final                   Value:NO WBC SEEN NO ORGANISMS SEEN   . Culture 07/16/2018    Final                   Value:NO GROWTH 2 DAYS Performed at [facility], [place] [address] [place], [place] [address]   . Report Status 07/16/2018 PENDING   Incomplete  . Specimen Description 07/16/2018 FLUID LEFT SHOULDER   Final  . Special Requests 07/16/2018 HOLD 14 DAYS FOR SHOULDER INFECTION SWABS   Final  . Culture 07/16/2018 NO ANAEROBES ISOLATED; CULTURE IN PROGRESS FOR 5 DAYS   Final  . Report Status 07/16/2018 PENDING   Incomplete  . Sodium 07/17/2018 137  135 - 145 mmol/L Final  . Potassium 07/17/2018 4.0  3.5 - 5.1 mmol/L Final  . Chloride 07/17/2018 103  98 - 111 mmol/L Final  . CO2 07/17/2018 25  22 - 32 mmol/L Final  . Glucose, Bld 07/17/2018 166* 70 - 99 mg/dL Final  . BUN 07/17/2018 27* 8 - 23 mg/dL Final  . Creatinine, Ser 07/17/2018 1.52* 0.61 - 1.24 mg/dL Final  . Calcium 07/17/2018 8.9  8.9 - 10.3 mg/dL Final  . GFR calc non Af Amer 07/17/2018 49* >60 mL/min Final  . GFR calc Af Amer 07/17/2018 56* >60 mL/min Final  . Anion gap 07/17/2018 9  5 -  15 Final   Performed at [facility], [place] [address] [place], [place] [address]  . Hemoglobin 07/17/2018 13.7  13.0 - 17.0 g/dL Final  . HCT 07/17/2018 41.6  39.0 - 52.0 % Final   Performed at [facility], [place] [address] [place], [place] [address]  Office Visit on 07/09/2018  Component Date Value Ref Range Status  . CRP 07/09/2018 89.8* <8.0 mg/L Final  . Sed Rate 07/09/2018 46* 0 - 20 mm/h Final  . Glucose, Bld 07/09/2018 94  65 - 99 mg/dL Final   Comment: .            Fasting reference interval .   . BUN 07/09/2018 18  7 - 25 mg/dL Final  . Creat 07/09/2018 1.49* 0.70 - 1.25 mg/dL Final   Comment: For patients >54 years of age, the reference  limit for Creatinine is approximately 13% higher for people identified as African-American. .   . GFR, Est Non African American 07/09/2018 50* > OR = 60 mL/min/1.91m Final  . GFR, Est African American 07/09/2018 58* > OR = 60 mL/min/1.757mFinal  . BUN/Creatinine Ratio 07/09/2018 12  6 - 22 (calc) Final  . Sodium 07/09/2018 137  135 - 146 mmol/L Final  . Potassium 07/09/2018 4.1  3.5 - 5.3 mmol/L Final  . Chloride 07/09/2018 96* 98 - 110 mmol/L Final  . CO2 07/09/2018 30  20 - 32 mmol/L Final  . Calcium 07/09/2018 9.6  8.6 - 10.3 mg/dL Final  . WBC 07/09/2018 10.3  3.8 - 10.8 Thousand/uL Final  . RBC 07/09/2018 5.38  4.20 - 5.80 Million/uL Final  . Hemoglobin 07/09/2018 17.4* 13.2 - 17.1 g/dL Final  . HCT 07/09/2018 50.1* 38.5 - 50.0 % Final  . MCV 07/09/2018 93.1  80.0 - 100.0 fL Final  . MCH 07/09/2018 32.3  27.0 - 33.0 pg Final  . MCHC 07/09/2018 34.7  32.0 - 36.0 g/dL Final  . RDW 07/09/2018 12.8  11.0 - 15.0 % Final  . Platelets 07/09/2018 232  140 - 400 Thousand/uL Final  . MPV 07/09/2018 11.2  7.5 - 12.5 fL Final  . Neutro Abs 07/09/2018 7,354  1,500 - 7,800 cells/uL Final  . Lymphs Abs 07/09/2018 1,339  850 - 3,900 cells/uL Final  . Absolute Monocytes 07/09/2018 1,205* 200 - 950 cells/uL Final  . Eosinophils Absolute 07/09/2018 350  15 - 500 cells/uL Final  . Basophils Absolute 07/09/2018 52  0 - 200 cells/uL Final  . Neutrophils Relative % 07/09/2018 71.4  % Final  . Total Lymphocyte 07/09/2018 13.0  % Final  . Monocytes Relative 07/09/2018 11.7  % Final  . Eosinophils Relative 07/09/2018 3.4  % Final  . Basophils Relative 07/09/2018 0.5  % Final  Admission on 06/11/2018, Discharged on 06/12/2018  Component Date Value Ref Range Status  . WBC 06/12/2018 13.9* 4.0 - 10.5 K/uL Final  . RBC 06/12/2018 4.72  4.22 - 5.81 MIL/uL Final  . Hemoglobin 06/12/2018 15.6  13.0 - 17.0 g/dL Final  . HCT 06/12/2018 44.5  39.0 - 52.0 % Final  . MCV 06/12/2018 94.3  80.0 - 100.0 fL  Final  . MCH 06/12/2018 33.1  26.0 - 34.0 pg Final  . MCHC 06/12/2018 35.1  30.0 - 36.0 g/dL Final  . RDW 06/12/2018 12.5  11.5 - 15.5 % Final  . Platelets 06/12/2018 163  150 - 400 K/uL Final  . nRBC 06/12/2018 0.0  0.0 - 0.2 % Final   Performed at  [facility], [place] [address], [place], [place] [address]  . Sodium 06/12/2018 136  135 - 145 mmol/L Final  . Potassium 06/12/2018 3.7  3.5 - 5.1 mmol/L Final  . Chloride 06/12/2018 99  98 - 111 mmol/L Final  . CO2 06/12/2018 25  22 - 32 mmol/L Final  . Glucose, Bld 06/12/2018 184* 70 - 99 mg/dL Final  . BUN 06/12/2018 25* 8 - 23 mg/dL Final  . Creatinine, Ser 06/12/2018 1.71* 0.61 - 1.24 mg/dL Final  . Calcium 06/12/2018 8.9  8.9 - 10.3 mg/dL Final  . GFR calc non Af Amer 06/12/2018 42* >60 mL/min Final  . GFR calc Af Amer 06/12/2018 49* >60 mL/min Final  . Anion gap 06/12/2018 12  5 - 15 Final   Performed at [facility], [place] [address] [place], [place] [address]  Hospital Outpatient Visit on 06/04/2018  Component Date Value Ref Range Status  . MRSA, PCR 06/04/2018 NEGATIVE  NEGATIVE Final  . Staphylococcus aureus 06/04/2018 NEGATIVE  NEGATIVE Final   Comment: (NOTE) The Xpert SA Assay (FDA approved for NASAL specimens in patients 51 years of age and older), is one component of a comprehensive surveillance program. It is not intended to diagnose infection nor to guide or monitor treatment. Performed at [facility], [place] [address], [place], [place] [address]   . WBC 06/04/2018 5.1  4.0 - 10.5 K/uL Final  . RBC 06/04/2018 5.78  4.22 - 5.81 MIL/uL Final  . Hemoglobin 06/04/2018 18.3* 13.0 - 17.0 g/dL Final  . HCT 06/04/2018 55.6* 39.0 - 52.0 % Final  . MCV 06/04/2018 96.2  80.0 - 100.0 fL Final  . MCH 06/04/2018 31.7  26.0 - 34.0 pg Final  . MCHC 06/04/2018 32.9  30.0 - 36.0 g/dL Final  . RDW 06/04/2018 13.1  11.5 - 15.5 % Final  . Platelets 06/04/2018 177  150 - 400 K/uL Final  . nRBC 06/04/2018 0.0  0.0 - 0.2 %  Final   Performed at [place] [address], [place], [place] [address]  . Sodium 06/04/2018 141  135 - 145 mmol/L Final  . Potassium 06/04/2018 3.9  3.5 - 5.1 mmol/L Final  . Chloride 06/04/2018 101  98 - 111 mmol/L Final  . CO2 06/04/2018 29  22 - 32 mmol/L Final  . Glucose, Bld 06/04/2018 112* 70 - 99 mg/dL Final  . BUN 06/04/2018 18  8 - 23 mg/dL Final  . Creatinine, Ser 06/04/2018 1.25* 0.61 - 1.24 mg/dL Final  . Calcium 06/04/2018 10.1  8.9 - 10.3 mg/dL Final  . GFR calc non Af Amer 06/04/2018 >60  >60 mL/min Final  . GFR calc Af Amer 06/04/2018 >60  >60 mL/min Final  . Anion gap 06/04/2018 11  5 - 15 Final   Performed at [facility], [place] [address] [place], [place] [address]     X-Rays:Dg Shoulder Left Port  Result Date: 07/16/2018 CLINICAL DATA:  Total left shoulder arthroplasty. EXAM: LEFT SHOULDER - 1 VIEW COMPARISON:  06/11/2018 FINDINGS: Stable appearance of total left shoulder arthroplasty with normal alignment of the orthopedic hardware. Healed proximal left humeral fracture with exuberant callus formation. Skin staples have been removed. IMPRESSION: Stable alignment of the orthopedic hardware, post total reverse left shoulder arthroplasty. Electronically Signed   By: Fidela Salisbury M.D.   On: 07/16/2018 18:20   [place] Ekg Site Rite  Result Date: 07/17/2018 If Site Rite image not attached, placement could not  be confirmed due to current cardiac rhythm.   EKG: Orders placed or performed during the hospital encounter of 12/31/17  . EKG 12-Lead  . EKG 12-Lead     Hospital Course: BRAIAN TIJERINA is a 63 y.o. who was admitted to Hospital. They were brought to the operating room on 07/16/2018 and underwent Procedure(s): Left shoulder irrigation and debridement with poly and head ball exchange.  Patient tolerated the procedure well and was later transferred to the recovery room and then to the orthopaedic floor for postoperative care.  They were given PO  and IV analgesics for pain control following their surgery.  They were given 24 hours of postoperative antibiotics of  Anti-infectives (From admission, onward)   Start     Dose/Rate Route Frequency Ordered Stop   07/18/18 0000  cefTRIAXone (ROCEPHIN) IVPB     2 g Intravenous Every 24 hours 07/18/18 1235 08/27/18 2359   07/17/18 1000  cefTRIAXone (ROCEPHIN) 2 g in sodium chloride 0.9 % 100 mL IVPB     2 g 200 mL/hr over 30 Minutes Intravenous Every 24 hours 07/17/18 0852     07/17/18 0300  vancomycin (VANCOCIN) 1,250 mg in sodium chloride 0.9 % 250 mL IVPB  Status:  Discontinued     1,250 mg 166.7 mL/hr over 90 Minutes Intravenous Every 12 hours 07/16/18 1833 07/17/18 0852   07/16/18 1620  vancomycin (VANCOCIN) powder  Status:  Discontinued       As needed 07/16/18 1620 07/16/18 1702   07/16/18 1530  vancomycin (VANCOCIN) 1,500 mg in sodium chloride 0.9 % 500 mL IVPB     1,500 mg 250 mL/hr over 120 Minutes Intravenous  Once 07/16/18 1525 07/16/18 1738     and started on DVT prophylaxis in the form of Aspirin.   PT and OT were ordered for total joint protocol.  He was assessed while on the floor by the infectious disease team and our recommendations per them were for 6 weeks of IV antibiotics with ceftriaxone via a PICC line.  This was placed on this hospitalization in the right arm.  He has home health nursing arranged for IV antibiotic delivery.  He also has follow-up arranged next week with the infectious disease specialist.  Discharge planning consulted to help with postop disposition and equipment needs.  Incision was healing well.  Patient was seen in rounds and was ready to go home.  He will return to see me in the office in 1 week to have his incisional VAC removed.   Diet: Regular diet Activity:NWB Follow-up:in 5 days Disposition - Home Discharged Condition: good   Discharge Instructions    Call MD / Call 911   Complete by:  As directed    If you experience chest pain or  shortness of breath, CALL 911 and be transported to the hospital emergency room.  If you develope a fever above 101 F, pus (white drainage) or increased drainage or redness at the wound, or calf pain, call your surgeon's office.   Constipation Prevention   Complete by:  As directed    Drink plenty of fluids.  Prune juice may be helpful.  You may use a stool softener, such as Colace (over the counter) 100 mg twice a day.  Use MiraLax (over the counter) for constipation as needed.   Diet - low sodium heart healthy   Complete by:  As directed    Home infusion instructions [place] May follow [place] Dosing Protocol; May administer Cathflo as  needed to maintain patency of vascular access device.; Flushing of vascular access device: per [facility] Protocol: 0.9% NaCl pre/post medica...   Complete by:  As directed    Instructions:  May follow [place] Dosing Protocol   Instructions:  May administer Cathflo as needed to maintain patency of vascular access device.   Instructions:  Flushing of vascular access device: per [facility] Protocol: 0.9% NaCl pre/post medication administration and prn patency; Heparin 100 u/ml, 87m for implanted ports and Heparin 10u/ml, 560mfor all other central venous catheters.   Instructions:  May follow [facility] Anaphylaxis Protocol for First Dose Administration in the home: 0.9% NaCl at 25-50 ml/hr to maintain IV access for protocol meds. Epinephrine 0.3 ml IV/IM PRN and Benadryl 25-50 IV/IM PRN s/s of anaphylaxis.   Instructions:  [facility] Coordinator (RN) to assist per patient IV care needs in the home PRN.   Increase activity slowly as tolerated   Complete by:  As directed      Allergies as of 07/18/2018      Reactions   Fentanyl Shortness Of Breath, Other (See Comments)   [facility] to [facility] ? DOSE REGULATION ?   Oxycodone Other (See Comments)   Pt states he goes through hot and cold flashes withdrawal like symptoms      Medication List      TAKE these medications   allopurinol 300 MG tablet Commonly known as:  ZYLOPRIM Take 300 mg by mouth daily.   ALPRAZolam 0.25 MG tablet Commonly known as:  XANAX Take 1 tablet (0.25 mg total) by mouth 3 (three) times daily as needed for anxiety.   amLODipine 10 MG tablet Commonly known as:  NORVASC Take 10 mg by mouth at bedtime.   cefTRIAXone  IVPB Commonly known as:  ROCEPHIN Inject 2 g into the vein daily. Indication:  Prosthetic Joint Infection Last Day of Therapy:  August 27, 2018 Labs - Once weekly:  CBC/D and BMP, Labs - Every other week:  ESR and CRP   celecoxib 200 MG capsule Commonly known as:  CELEBREX Take 200 mg by mouth 2 (two) times daily.   cephALEXin 500 MG capsule Commonly known as:  KEFLEX TAKE 1 CAPSULE (500 MG TOTAL) BY MOUTH EVERY 6 (SIX) HOURS.   clonazePAM 1 MG tablet Commonly known as:  KLONOPIN Take 1 mg by mouth daily as needed for anxiety.   CoQ-10 200 MG Caps Take 200 mg by mouth daily.   fluticasone 50 MCG/ACT nasal spray Commonly known as:  FLONASE Place 2 sprays into both nostrils daily as needed for allergies.   gabapentin 100 MG capsule Commonly known as:  NEURONTIN Take 100 mg by mouth 3 (three) times daily.   ibuprofen 200 MG tablet Commonly known as:  ADVIL,MOTRIN Take 600 mg by mouth daily as needed for headache or moderate pain.   Magnesium 400 MG Tabs Take 400 mg by mouth daily.   meloxicam 7.5 MG tablet Commonly known as:  MOBIC Take 7.5 mg by mouth daily.   multivitamin with minerals Tabs tablet Take 1 tablet by mouth daily.   ondansetron 4 MG disintegrating tablet Commonly known as:  ZOFRAN ODT Take 1 tablet (4 mg total) by mouth every 8 (eight) hours as needed.   pantoprazole 20 MG tablet Commonly known as:  PROTONIX Take 20 mg by mouth every evening.   propranolol ER 80 MG 24 hr capsule Commonly known as:  INDERAL LA Take 80 mg by mouth every evening.  ranitidine 150 MG tablet Commonly known as:   ZANTAC Take 150 mg by mouth daily as needed for heartburn.   testosterone cypionate 200 MG/ML injection Commonly known as:  DEPOTESTOSTERONE CYPIONATE Inject 100 mg into the muscle every Wednesday.   traMADol 50 MG tablet Commonly known as:  ULTRAM Take 1 tablet (50 mg total) by mouth every 6 (six) hours as needed for moderate pain. What changed:  Another medication with the same name was added. Make sure you understand how and when to take each.   traMADol 50 MG tablet Commonly known as:  ULTRAM Take 1 tablet (50 mg total) by mouth every 6 (six) hours as needed for moderate pain. What changed:  You were already taking a medication with the same name, and this prescription was added. Make sure you understand how and when to take each.            Home Infusion Instuctions  (From admission, onward)         Start     Ordered   07/18/18 0000  Home infusion instructions [facility] May follow [place] Dosing Protocol; May administer Cathflo as needed to maintain patency of vascular access device.; Flushing of vascular access device: per [facility] Protocol: 0.9% NaCl pre/post medica...    Question Answer Comment  Instructions May follow [place] Dosing Protocol   Instructions May administer Cathflo as needed to maintain patency of vascular access device.   Instructions Flushing of vascular access device: per [facility] Protocol: 0.9% NaCl pre/post medication administration and prn patency; Heparin 100 u/ml, 2m for implanted ports and Heparin 10u/ml, 532mfor all other central venous catheters.   Instructions May follow [facility] Anaphylaxis Protocol for First Dose Administration in the home: 0.9% NaCl at 25-50 ml/hr to maintain IV access for protocol meds. Epinephrine 0.3 ml IV/IM PRN and Benadryl 25-50 IV/IM PRN s/s of anaphylaxis.   Instructions [facility] Infusion Coordinator (RN) to assist per patient IV care needs in the home PRN.      07/18/18 1235         Follow-up  Information    RoNicholes StairsMD. Schedule an appointment as soon as possible for a visit on 07/23/2018.   Specialty:  Orthopedic Surgery Contact information: [address] [place] [phone]        Health, [facility]-Home Follow up.   Specialty:  [place]:  A representative from [facility] contact you to arrange start date and time for Home Health Nurse for IV antibiotics and PICC Care. Contact information: [phone]         Signed: JaGeralynn RileMD Orthopaedic Surgery 07/18/2018, 4:49 PM

## 2018-07-18 NOTE — Discharge Instructions (Signed)
No lifting with left arm Sling for comfort only Keep incisions dry, do not get wet Return to see Dr. [name] , next Tuesday Jan. 28th for [facility] removal

## 2018-07-18 NOTE — Progress Notes (Signed)
Peripherally Inserted Central Catheter/Midline Placement  The IV Nurse has discussed with the patient and/or persons authorized to consent for the patient, the purpose of this procedure and the potential benefits and risks involved with this procedure.  The benefits include less needle sticks, lab draws from the catheter, and the patient may be discharged home with the catheter. Risks include, but not limited to, infection, bleeding, blood clot (thrombus formation), and puncture of an artery; nerve damage and irregular heartbeat and possibility to perform a PICC exchange if needed/ordered by physician.  Alternatives to this procedure were also discussed.  Bard Power PICC patient education guide, fact sheet on infection prevention and patient information card has been provided to patient /or left at bedside.    PICC/Midline Placement Documentation        Darlyn Read 07/18/2018, 11:17 AM

## 2018-07-19 ENCOUNTER — Telehealth: Payer: Self-pay | Admitting: *Deleted

## 2018-07-19 DIAGNOSIS — T8459XA Infection and inflammatory reaction due to other internal joint prosthesis, initial encounter: Secondary | ICD-10-CM | POA: Diagnosis not present

## 2018-07-19 DIAGNOSIS — I10 Essential (primary) hypertension: Secondary | ICD-10-CM | POA: Diagnosis not present

## 2018-07-19 DIAGNOSIS — B9689 Other specified bacterial agents as the cause of diseases classified elsewhere: Secondary | ICD-10-CM | POA: Diagnosis not present

## 2018-07-19 DIAGNOSIS — M109 Gout, unspecified: Secondary | ICD-10-CM | POA: Diagnosis not present

## 2018-07-19 DIAGNOSIS — Z452 Encounter for adjustment and management of vascular access device: Secondary | ICD-10-CM | POA: Diagnosis not present

## 2018-07-19 DIAGNOSIS — Z96612 Presence of left artificial shoulder joint: Secondary | ICD-10-CM | POA: Diagnosis not present

## 2018-07-19 DIAGNOSIS — M1990 Unspecified osteoarthritis, unspecified site: Secondary | ICD-10-CM | POA: Diagnosis not present

## 2018-07-19 DIAGNOSIS — F419 Anxiety disorder, unspecified: Secondary | ICD-10-CM | POA: Diagnosis not present

## 2018-07-19 DIAGNOSIS — Z5181 Encounter for therapeutic drug level monitoring: Secondary | ICD-10-CM | POA: Diagnosis not present

## 2018-07-19 DIAGNOSIS — Z96611 Presence of right artificial shoulder joint: Secondary | ICD-10-CM | POA: Diagnosis not present

## 2018-07-19 DIAGNOSIS — K219 Gastro-esophageal reflux disease without esophagitis: Secondary | ICD-10-CM | POA: Diagnosis not present

## 2018-07-19 DIAGNOSIS — Z96653 Presence of artificial knee joint, bilateral: Secondary | ICD-10-CM | POA: Diagnosis not present

## 2018-07-19 DIAGNOSIS — Z792 Long term (current) use of antibiotics: Secondary | ICD-10-CM | POA: Diagnosis not present

## 2018-07-19 NOTE — Telephone Encounter (Signed)
Patient called [facility], stating he is unable to infuse his own medication at home. He has help through the weekend, needs to get infusions daily at [facility]. Mary at [facility] will coordinate short stay infusions, needs assistance with prior authorization through [facility] for medication/infusion.  RN attempted urgent PA by faxing completed form to [facility] at   [phone]. Landis Gandy, RN

## 2018-07-19 NOTE — Telephone Encounter (Signed)
thanks

## 2018-07-21 LAB — ANAEROBIC CULTURE

## 2018-07-22 DIAGNOSIS — A499 Bacterial infection, unspecified: Secondary | ICD-10-CM | POA: Diagnosis not present

## 2018-07-22 NOTE — Telephone Encounter (Signed)
Authorization obtained from [facility]. RN notified [place] and faxed signed short stay infusion orders to [place] at [facility].

## 2018-07-23 ENCOUNTER — Encounter (HOSPITAL_COMMUNITY): Payer: PPO

## 2018-07-23 ENCOUNTER — Ambulatory Visit (INDEPENDENT_AMBULATORY_CARE_PROVIDER_SITE_OTHER): Payer: PPO | Admitting: Infectious Disease

## 2018-07-23 VITALS — BP 125/86 | HR 73 | Temp 97.7°F | Wt 211.0 lb

## 2018-07-23 DIAGNOSIS — Z96619 Presence of unspecified artificial shoulder joint: Secondary | ICD-10-CM

## 2018-07-23 DIAGNOSIS — Z96612 Presence of left artificial shoulder joint: Secondary | ICD-10-CM

## 2018-07-23 DIAGNOSIS — T8459XD Infection and inflammatory reaction due to other internal joint prosthesis, subsequent encounter: Secondary | ICD-10-CM

## 2018-07-23 NOTE — Telephone Encounter (Signed)
Herminio Commons called from [facility] an wanted Dr. [name] to be aware that patient has decided not to do IV antibiotics at short stay.  The patient would rather do them at home.  [facility] has arranged patient to do in-home IV therapy. Pricilla Riffle RN

## 2018-07-23 NOTE — Progress Notes (Signed)
Subjective:    Chief complaint: Follow-up for infected prosthetic shoulder    Patient ID: Geoffrey West, male    DOB: 17-Sep-1955, 63 y.o.   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 He was taken to OR by Dr. [name] on June 15th of 2018 and had shoulder reviisino and reversal of total shoulder arthroplasty. 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   I am for the first time in December 25, 2016 he actually was having no pain whatsoever.   We worked him into clinic urgently after phone call from Dr. [name] arrange for placement of a PICC line.  However he did not like the PICC line and we ended up changing him to oral antibiotics in the form of   We worked to place  IV ceftriaxone 2grams daily to cover the P acnes and the Coag Neg Staph plus rifampin 300 mg BID  Zyvox and rifampin but had trouble tolerating this regimen as well in particular the rifampin which we then discontinued and ultimately transitioned him to Augmentin. I planned him  to have 6 months of therapy and emphasized that we should see him again in January 2019 which would be the six-month mark of treatment of his prosthetic joint infection.  I saw him in January 2019 and his inflammatory markers were reassuring and he was not having pain.  Unfortunately after this he was admitted in the summertime after he had developed severe pain and was found on CT scan to have glenoid component screws that had loosened with a lucency between the inferior portion of the glenoid component and just glenoid bone with large amounts of fluid in the subacromial and subdeltoid bursa.  To the operating room on July 9 and underwent explantation of all components of his left reverse shoulder arthroplasty with  insertion of antibiotic spacer in the left shoulder with open reduction internal internal fixation of a left proximal humerus fracture.  [name] did mention his operative note that there was problems with the superior and anterior peripheral locking screws which are fractured.  He said they were not able to retrieve the tip of the screw as it was deep in the glenoid vault.  Cultures were sent and the patient then did grow Propionibacterium on all 4 specimens were sent.  Patient was treated with ceftriaxone as an inpatient also had gotten vancomycin.  He was ultimately discharged on oral Keflex 500 mg 4 times daily because he did not want have a PICC line again.  Unfortunately not gain control of infection with the surgery and antibiotics and he had to go back to the operating room yet again timbre underwent she underwent explantation of his humeral antibiotic spacer, bone grafting of the left glenoid fossa bone defect excisional debridement of bone muscle subcutaneous tissue and placement of a left antibiotic spacer.  Ulcers done in September on antibiotics failed to yield an organism.  He ultimately was taken back to the operating room in December still on oral Keflex per our recommendations and underwent plan of his humeral antibiotic spacer and reversal of his total shoulder revision.  He was doing well postoperatively and had hardly any pain and minimal need for any pain meds.  He states he only took a few doses of tramadol.  However  in the last week he is developed severe pain in his left shoulder.  He wondereed if it could have been due to the fact that he is now going through physical therapy and rehab and he also had a massage where his shoulder was down.  He stated he had pain there all the time but is worse when he moves the shoulder and he rated it as being dull deep but also sharp and can be up to 8 or 9 out of 10 in severity.  He  was trying to manage this with gabapentin and nonsteroidal along with  tramadol but he was not achieving adequate pain control.  We checked his inflammatory markers his sed rate had climbed to 46 and a CRP had gone to the 9.8 I was very concerned about this as well as Dr. [name].  He was ultimately taken to the operating room January 21 and underwent excisional and non-excisional debridement of infected left shoulder prosthesis including skin subcutaneous tissue muscle and fascia.  A reverse total shoulder revision was performed with polyethylene and glenosphere head ball of 2 components.  Was taken the operating room failed to grow any organisms anaerobically and are being still held for 2 weeks.  There is no growth at 7 days.  I doubt we will grow an organism given he was on antibiotics previously and the media is not designed to pick up nontuberculous mycobacteria nocardia or nocardia terribly well, should be microorganisms I would be thinking would take that long to grow potentially.  Her pain is improved since being in the hospital.  He was seen by my partner Dr. [name] who changed the patient from IV vancomycin to 2 g of ceftriaxone daily.  The patient as mentioned had previously been unwilling to try IV antibiotics at this time is been tolerating him.  His PICC line is clean dry and intact.  As mentioned his pain is improved relative to when he was in the hospital he is proceeding with some physical therapy.  I told him that he would need to be on some oral antibiotics chronically after completing his antibiotics.  He did tell me that he had had an episode of C. difficile colitis 18 years ago when he was given antibiotics for a sinus infection.     Past Medical History:  Diagnosis Date  . Anxiety   . Arthritis    "all over" (03/06/2018)  . GERD (gastroesophageal reflux disease)   . Gout    "on daily RX" (03/06/2018)  . Hypertension   . Hypertension 12/25/2016  . Low testosterone 12/25/2016  . Migraine    "haven't had more than 1/year since propanolol started"  (03/06/2018)  . 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 Situational depression    "recently lost my mom and brother" (03/06/2018)    Past Surgical History:  Procedure Laterality Date  . BACK SURGERY    . COLONOSCOPY    . COLONOSCOPY W/ BIOPSIES AND POLYPECTOMY    . EXCISIONAL TOTAL SHOULDER ARTHROPLASTY WITH ANTIBIOTIC SPACER Left 03/06/2018   Procedure: Left shoulder antibiotic spacer expalnt with glenoid bone grafting, antiobiotic spacer placement;  Surgeon: [name], MD;  Location: [facility];  Service: Orthopedics;  Laterality: Left;  . IR FLUORO GUIDE CV LINE RIGHT  12/26/2016  . JOINT REPLACEMENT    . KNEE ARTHROSCOPY Bilateral    X 10 total arthroscopies  . KNEE CARTILAGE SURGERY Left 1977   "cartilage removed"  . LASIK Bilateral   . LUMBAR  [place] [facility]  2000s   "ruptured disc"  . REVERSE SHOULDER ARTHROPLASTY Left 12/08/2016   Procedure: Revision left total shoulder to reverse total shoulder arthroplasty;  Surgeon: [name], MD;  Location: [facility];  Service: Orthopedics;  Laterality: Left;  . REVERSE SHOULDER ARTHROPLASTY Left 01/01/2018   Procedure: Left reverse shouler explant with antibiotic spacer placement;  Surgeon: [name], MD;  Location: [facility];  Service: Orthopedics;  Laterality: Left;  2.5 hrs  . REVERSE SHOULDER ARTHROPLASTY Left 06/11/2018   Procedure: REVERSE TOTAL SHOULDER REVISION;  Surgeon: [name], MD;  Location: [facility];  Service: Orthopedics;  Laterality: Left;  . REVISION TOTAL SHOULDER TO REVERSE TOTAL SHOULDER Left 07/16/2018   Procedure: Left shoulder irrigation and debridement with poly and head ball exchange;  Surgeon: [name], MD;  Location: [facility];  Service: Orthopedics;  Laterality: Left;  2 hrs  . SHOULDER ARTHROSCOPY Right 1980s/1990s   "bone spur removed"  . SHOULDER OPEN ROTATOR CUFF REPAIR Right 1970s  . SHOULDER SURGERY Left 03/06/2018   antibiotic spacer expalnt with glenoid bone  grafting, antiobiotic spacer placement  . TOTAL KNEE ARTHROPLASTY Bilateral   . TOTAL SHOULDER ARTHROPLASTY Left ~ 2014-2017 X 2   "in [place]"  . TOTAL SHOULDER REPLACEMENT Right ~ 2009   "in [place]"  . WISDOM TOOTH EXTRACTION      Family History  Problem Relation Age of Onset  . Arthritis Mother   . Arthritis Sister   . Alzheimer's disease Brother   . Arthritis Brother       Social History   Socioeconomic History  . Marital status: Single    Spouse name: Not on file  . Number of children: Not on file  . Years of education: Not on file  . Highest education level: Not on file  Occupational History  . Not on file  Social Needs  . Financial resource strain: Not on file  . Food insecurity:    Worry: Not on file    Inability: Not on file  . Transportation needs:    Medical: Not on file    Non-medical: Not on file  Tobacco Use  . Smoking status: Never Smoker  . Smokeless tobacco: Never Used  Substance and Sexual Activity  . Alcohol use: Not Currently    Comment: 03/06/2018 "max of 2 drinks/year; if that"  . Drug use: Never  . Sexual activity: Not Currently  Lifestyle  . Physical activity:    Days per week: Not on file    Minutes per session: Not on file  . Stress: Not on file  Relationships  . Social connections:    Talks on phone: Not on file    Gets together: Not on file    Attends religious service: Not on file    Active member of club or organization: Not on file    Attends meetings of clubs or organizations: Not on file    Relationship status: Not on file  Other Topics Concern  . Not on file  Social History Narrative  . Not on file    Allergies  Allergen Reactions  . Fentanyl Shortness Of Breath and Other (See Comments)    Reaction to [facility] ONLY > ? DOSE REGULATION ?   [name] Oxycodone Other (See Comments)    Pt states he goes through hot and cold flashes withdrawal like symptoms     Current Outpatient Medications:  .  allopurinol (ZYLOPRIM)  300 MG tablet, Take 300 mg by mouth  daily., Disp: , Rfl:  .  ALPRAZolam (XANAX) 0.25 MG tablet, Take 1 tablet (0.25 mg total) by mouth 3 (three) times daily as needed for anxiety., Disp: 30 tablet, Rfl: 0 .  amLODipine (NORVASC) 10 MG tablet, Take 10 mg by mouth at bedtime. , Disp: , Rfl:  .  cefTRIAXone (ROCEPHIN) IVPB, Inject 2 g into the vein daily. Indication:  Prosthetic Joint Infection Last Day of Therapy:  August 27, 2018 Labs - Once weekly:  CBC/D and BMP, Labs - Every other week:  ESR and CRP, Disp: 37 Units, Rfl: 0 .  celecoxib (CELEBREX) 200 MG capsule, Take 200 mg by mouth 2 (two) times daily., Disp: , Rfl: 3 .  cephALEXin (KEFLEX) 500 MG capsule, TAKE 1 CAPSULE (500 MG TOTAL) BY MOUTH EVERY 6 (SIX) HOURS., Disp: 120 capsule, Rfl: 0 .  clonazePAM (KLONOPIN) 1 MG tablet, Take 1 mg by mouth daily as needed for anxiety., Disp: , Rfl:  .  Coenzyme Q10 (COQ-10) 200 MG CAPS, Take 200 mg by mouth daily., Disp: , Rfl:  .  fluticasone (FLONASE) 50 MCG/ACT nasal spray, Place 2 sprays into both nostrils daily as needed for allergies. , Disp: , Rfl: 3 .  gabapentin (NEURONTIN) 100 MG capsule, Take 100 mg by mouth 3 (three) times daily., Disp: , Rfl:  .  ibuprofen (ADVIL,MOTRIN) 200 MG tablet, Take 600 mg by mouth daily as needed for headache or moderate pain. , Disp: , Rfl:  .  Magnesium 400 MG TABS, Take 400 mg by mouth daily., Disp: , Rfl:  .  meloxicam (MOBIC) 7.5 MG tablet, Take 7.5 mg by mouth daily., Disp: , Rfl:  .  Multiple Vitamin (MULTIVITAMIN WITH MINERALS) TABS tablet, Take 1 tablet by mouth daily., Disp: , Rfl:  .  ondansetron (ZOFRAN ODT) 4 MG disintegrating tablet, Take 1 tablet (4 mg total) by mouth every 8 (eight) hours as needed. (Patient not taking: Reported on 07/12/2018), Disp: 20 tablet, Rfl: 0 .  pantoprazole (PROTONIX) 20 MG tablet, Take 20 mg by mouth every evening., Disp: , Rfl:  .  propranolol ER (INDERAL LA) 80 MG 24 hr capsule, Take 80 mg by mouth every evening. , Disp: ,  Rfl: 1 .  ranitidine (ZANTAC) 150 MG tablet, Take 150 mg by mouth daily as needed for heartburn., Disp: , Rfl:  .  testosterone cypionate (DEPOTESTOSTERONE CYPIONATE) 200 MG/ML injection, Inject 100 mg into the muscle every Wednesday. , Disp: , Rfl: 5 .  traMADol (ULTRAM) 50 MG tablet, Take 1 tablet (50 mg total) by mouth every 6 (six) hours as needed for moderate pain., Disp: 50 tablet, Rfl: 0 .  traMADol (ULTRAM) 50 MG tablet, Take 1 tablet (50 mg total) by mouth every 6 (six) hours as needed for moderate pain., Disp: 50 tablet, Rfl: 0   Review of Systems  Constitutional: Negative for chills and fever.  HENT: Negative for congestion and sore throat.   Eyes: Negative for photophobia.  Respiratory: Negative for cough, shortness of breath and wheezing.   Cardiovascular: Negative for chest pain, palpitations and leg swelling.  Gastrointestinal: Negative for abdominal pain, blood in stool, constipation, diarrhea, nausea and vomiting.  Genitourinary: Negative for dysuria, flank pain and hematuria.  Musculoskeletal: Positive for arthralgias. Negative for back pain and myalgias.  Skin: Positive for wound. Negative for rash.  Neurological: Negative for dizziness, weakness and headaches.  Hematological: Does not bruise/bleed easily.  Psychiatric/Behavioral: Negative for suicidal ideas.       Objective:   Physical Exam  Constitutional: He is oriented to person, place, and time. He appears well-developed and well-nourished. No distress.  HENT:  Head: Normocephalic and atraumatic.  Mouth/Throat: No oropharyngeal exudate.  Eyes: Conjunctivae and EOM are normal. No scleral icterus.  Neck: Normal range of motion. Neck supple.  Cardiovascular: Normal rate and regular rhythm.  Pulmonary/Chest: Effort normal. No respiratory distress. He has no wheezes.  Abdominal: He exhibits no distension.  Musculoskeletal:        General: No tenderness or edema.  Neurological: He is alert and oriented to person,  place, and time. He exhibits normal muscle tone. Coordination normal.  Skin: Skin is warm and dry. No rash noted. He is not diaphoretic. No erythema. No pallor.  Psychiatric: He has a normal mood and affect. His behavior is normal. Judgment and thought content normal.   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  I spent greater than 25 minutes with the patient including greater than 50% of time in face to face counsel of the patient the nature of this type of infection how we would proceed with IV and oral antibiotics of the risk benefits to antibiotics versus not giving them in which his case there is greater benefit to giving them and in coordination of his care.

## 2018-07-24 ENCOUNTER — Encounter (HOSPITAL_COMMUNITY): Payer: PPO

## 2018-07-25 ENCOUNTER — Encounter (HOSPITAL_COMMUNITY): Payer: PPO

## 2018-07-25 DIAGNOSIS — T8459XA Infection and inflammatory reaction due to other internal joint prosthesis, initial encounter: Secondary | ICD-10-CM | POA: Diagnosis not present

## 2018-07-25 DIAGNOSIS — Z452 Encounter for adjustment and management of vascular access device: Secondary | ICD-10-CM | POA: Diagnosis not present

## 2018-07-25 DIAGNOSIS — Z5181 Encounter for therapeutic drug level monitoring: Secondary | ICD-10-CM | POA: Diagnosis not present

## 2018-07-25 DIAGNOSIS — Z96611 Presence of right artificial shoulder joint: Secondary | ICD-10-CM | POA: Diagnosis not present

## 2018-07-25 DIAGNOSIS — B9689 Other specified bacterial agents as the cause of diseases classified elsewhere: Secondary | ICD-10-CM | POA: Diagnosis not present

## 2018-07-25 DIAGNOSIS — Z96653 Presence of artificial knee joint, bilateral: Secondary | ICD-10-CM | POA: Diagnosis not present

## 2018-07-25 DIAGNOSIS — Z792 Long term (current) use of antibiotics: Secondary | ICD-10-CM | POA: Diagnosis not present

## 2018-07-26 ENCOUNTER — Encounter (HOSPITAL_COMMUNITY): Payer: PPO

## 2018-07-29 DIAGNOSIS — A499 Bacterial infection, unspecified: Secondary | ICD-10-CM | POA: Diagnosis not present

## 2018-07-31 LAB — BODY FLUID CULTURE
Culture: NO GROWTH
GRAM STAIN: NONE SEEN

## 2018-08-05 DIAGNOSIS — A499 Bacterial infection, unspecified: Secondary | ICD-10-CM | POA: Diagnosis not present

## 2018-08-07 ENCOUNTER — Telehealth: Payer: Self-pay

## 2018-08-07 ENCOUNTER — Encounter: Payer: Self-pay | Admitting: Infectious Diseases

## 2018-08-07 ENCOUNTER — Ambulatory Visit (INDEPENDENT_AMBULATORY_CARE_PROVIDER_SITE_OTHER): Payer: PPO | Admitting: Infectious Diseases

## 2018-08-07 VITALS — BP 118/80 | HR 76 | Temp 98.3°F | Wt 211.8 lb

## 2018-08-07 DIAGNOSIS — Z452 Encounter for adjustment and management of vascular access device: Secondary | ICD-10-CM | POA: Diagnosis not present

## 2018-08-07 DIAGNOSIS — T8459XA Infection and inflammatory reaction due to other internal joint prosthesis, initial encounter: Secondary | ICD-10-CM

## 2018-08-07 DIAGNOSIS — T8459XD Infection and inflammatory reaction due to other internal joint prosthesis, subsequent encounter: Secondary | ICD-10-CM

## 2018-08-07 DIAGNOSIS — Z96619 Presence of unspecified artificial shoulder joint: Secondary | ICD-10-CM | POA: Diagnosis not present

## 2018-08-07 NOTE — Telephone Encounter (Signed)
Patient concerned about increased pain in his shoulder. Previous concern sent to Dr. Tommy Medal for advise. LPN suggested earlier appointment and patient agreed. Patient to see Doren Custard, NP this afternoon. Patient also contacted Ortho surgeon office to make aware.  Eugenia Mcalpine, LPN

## 2018-08-07 NOTE — Assessment & Plan Note (Signed)
He is doing quite well with PICC - some anxiety with it but mostly related to bigger picture. Site unremarkable, well maintained and functioning as expected. Continue care and maintenance until planned end of IV antibiotics. Home Health team to remove at completion.

## 2018-08-07 NOTE — Progress Notes (Signed)
Patient: Geoffrey West  DOB: [birth year] MRN: [id number] PCP: Orpah Melter, MD    Patient Active Problem List   Diagnosis Date Noted  . PICC (peripherally inserted central catheter) in place 08/07/2018  . Prosthetic shoulder infection, subsequent encounter 03/06/2018  . 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 Hypertension 12/25/2016  . Low testosterone 12/25/2016  . Gout 12/25/2016  . 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 Chronic pain syndrome 07/05/2013  . Encounter for therapeutic drug monitoring 07/05/2013  . Radiculopathy, lumbosacral region 07/05/2013     Subjective:  Geoffrey West is a 63 y.o. male here today for work in for ongoing shoulder pain.   He has been following with Dr. Tommy Medal since 2018 for left prosthetic shoulder joint infection.   He has a complicated history of multiple surgeries to this shoulder (7 from his report). He failed a total shoulder arthoplasty in 2018 with loosening and shoulder dislocation in June 2018 - taken to OR for revision and reversal of joint; cultures at that time grew out propionobacterium and MS-CoNS species. He was at this time treated with Ceftriaxone and Rifampin therapy, however a few days into therapy he decided that he was unable to continue with PICC line and was placed on zyvox and rifampin therapy. He had trouble tolerating this and was switched to Augmenting BID with intended therapy of 6 months.   January-2019 he returned for follow up and inflammatory markers were normal and advised to stop Augmentin and follow. In June 2019 he developed pain in the shoulder several weeks after a swimming injury and underwent CT scan that revealed a large amount of fluid indicating bursitis; at this time he underwent surgery for hardware explant and antibiotic spacer December 30 2017. He declined PICC Line at that time again and D/C'd  home on Cephalexin 500 mg QID through his re-implantation in December 19.   January-2020 followed up with Dr. Tommy Medal and was complaining of pain; inflammatory markers were very elevated again (CRP 89, ESR 46). He was again taken to OR for washout July 16, 2018. Intraoperative cultures at this time were negative. He was agreeable to PICC line this time and has been infusing ceftriaxone at home QD as instructed with intended therapy through March 3rd.   He says that 8 days ago he started experiencing increase in pain to a level of 8/10 at times and described to be constant ache. He is POD# 23 today. Incision is completely healed and he reports no drainage. He does however have associated swelling and more redness to incision than he experienced before. He has needed to take Tramadol to help him with pain and allow him to sleep. He was working with PT but unable to participate now. He is tearful today and remorseful that he "should have done the PICC earlier."  Review of Systems  Constitutional: Negative for chills and fever.  Respiratory: Negative for cough.   Cardiovascular: Negative for chest pain and leg swelling.  Gastrointestinal: Negative for abdominal pain, diarrhea and vomiting.  Genitourinary: Negative for flank pain.  Musculoskeletal: Negative for joint pain (L shoulder ).       PICC line is without pain, drainage or erythema and is well maintained by [facility]. No swelling or altered sensation in affected distal extremity.    Skin: Negative for rash.  Neurological: Negative for dizziness, tingling and headaches.  Psychiatric/Behavioral:  The patient is nervous/anxious. The patient does not have insomnia.        Tearful today at times. Frustrated.     Past Medical History:  Diagnosis Date  . Anxiety   . Arthritis    "all over" (03/06/2018)  . GERD (gastroesophageal reflux disease)   . Gout    "on daily RX" (03/06/2018)  . Hypertension   . Hypertension 12/25/2016  . Low  testosterone 12/25/2016  . Migraine    "haven't had more than 1/year since propanolol started" (03/06/2018)  . 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 Situational depression    "recently lost my mom and brother" (03/06/2018)    Outpatient Medications Prior to Visit  Medication Sig Dispense Refill  . allopurinol (ZYLOPRIM) 300 MG tablet Take 300 mg by mouth daily.    [name] ALPRAZolam (XANAX) 0.25 MG tablet Take 1 tablet (0.25 mg total) by mouth 3 (three) times daily as needed for anxiety. 30 tablet 0  . amLODipine (NORVASC) 10 MG tablet Take 10 mg by mouth at bedtime.     . cefTRIAXone (ROCEPHIN) IVPB Inject 2 g into the vein daily. Indication:  Prosthetic Joint Infection Last Day of Therapy:  August 27, 2018 Labs - Once weekly:  CBC/D and BMP, Labs - Every other week:  ESR and CRP 37 Units 0  . clonazePAM (KLONOPIN) 1 MG tablet Take 1 mg by mouth daily as needed for anxiety.    . Coenzyme Q10 (COQ-10) 200 MG CAPS Take 200 mg by mouth daily.    . fluticasone (FLONASE) 50 MCG/ACT nasal spray Place 2 sprays into both nostrils daily as needed for allergies.   3  . gabapentin (NEURONTIN) 100 MG capsule Take 100 mg by mouth 3 (three) times daily.    [name] ibuprofen (ADVIL,MOTRIN) 200 MG tablet Take 600 mg by mouth daily as needed for headache or moderate pain.     . meloxicam (MOBIC) 7.5 MG tablet Take 7.5 mg by mouth daily.    . Multiple Vitamin (MULTIVITAMIN WITH MINERALS) TABS tablet Take 1 tablet by mouth daily.    . ondansetron (ZOFRAN ODT) 4 MG disintegrating tablet Take 1 tablet (4 mg total) by mouth every 8 (eight) hours as needed. 20 tablet 0  . pantoprazole (PROTONIX) 20 MG tablet Take 20 mg by mouth every evening.    . propranolol ER (INDERAL LA) 80 MG 24 hr capsule Take 80 mg by mouth every evening.   1  . ranitidine (ZANTAC) 150 MG tablet Take 150 mg by mouth daily as needed for heartburn.    . testosterone cypionate (DEPOTESTOSTERONE CYPIONATE) 200 MG/ML injection Inject 100 mg into the  muscle every Wednesday.   5  . traMADol (ULTRAM) 50 MG tablet Take 1 tablet (50 mg total) by mouth every 6 (six) hours as needed for moderate pain. 50 tablet 0  . traMADol (ULTRAM) 50 MG tablet Take 1 tablet (50 mg total) by mouth every 6 (six) hours as needed for moderate pain. 50 tablet 0  . celecoxib (CELEBREX) 200 MG capsule Take 200 mg by mouth 2 (two) times daily.  3  . cephALEXin (KEFLEX) 500 MG capsule TAKE 1 CAPSULE (500 MG TOTAL) BY MOUTH EVERY 6 (SIX) HOURS. (Patient not taking: Reported on 08/07/2018) 120 capsule 0  . Magnesium 400 MG TABS Take 400 mg by mouth daily.     No facility-administered medications prior to visit.      Allergies  Allergen Reactions  . Fentanyl Shortness Of Breath and Other (  See Comments)    Reaction to [facility] ONLY > ? DOSE REGULATION ?   [name] Oxycodone Other (See Comments)    Pt states he goes through hot and cold flashes withdrawal like symptoms    Social History   Tobacco Use  . Smoking status: Never Smoker  . Smokeless tobacco: Never Used  Substance Use Topics  . Alcohol use: Not Currently    Comment: 03/06/2018 "max of 2 drinks/year; if that"  . Drug use: Never    Family History  Problem Relation Age of Onset  . Arthritis Mother   . Arthritis Sister   . Alzheimer's disease Brother   . Arthritis Brother     Objective:   Vitals:   08/07/18 1545  BP: 118/80  Pulse: 76  Temp: 98.3 F (36.8 C)  TempSrc: Oral  Weight: 211 lb 12.8 oz (96.1 kg)   Body mass index is 29.54 kg/m.  Physical Exam Constitutional:      Appearance: Normal appearance. He is not ill-appearing or toxic-appearing.  Cardiovascular:     Rate and Rhythm: Normal rate and regular rhythm.     Heart sounds: Normal heart sounds.  Pulmonary:     Breath sounds: Normal breath sounds.  Musculoskeletal:     Comments: Left shoulder incision as pictured - there is hyperemia around the surgical incision, some mild warmth and swelling to the deltoid at inferior aspect of  incision. He has no point tenderness but just a general ache. He is limited and cannot abduct shoulder joint to 90 degrees  Skin:    Capillary Refill: Capillary refill takes less than 2 seconds.     Comments: RUE PICC line - clean/dry dressing. Insertion site w/o erythema, tenderness, drainage, cording or distal swelling of affected extremity    Neurological:     Mental Status: He is alert.       Lab Results: Lab Results  Component Value Date   WBC 10.3 07/09/2018   HGB 13.7 07/17/2018   HCT 41.6 07/17/2018   MCV 93.1 07/09/2018   PLT 232 07/09/2018    Lab Results  Component Value Date   CREATININE 1.52 (H) 07/17/2018   BUN 27 (H) 07/17/2018   NA 137 07/17/2018   K 4.0 07/17/2018   CL 103 07/17/2018   CO2 25 07/17/2018    Lab Results  Component Value Date   ALT 19 02/06/2017   AST 21 02/06/2017   ALKPHOS 75 02/06/2017   BILITOT 0.6 02/06/2017    Sed Rate  Date Value  07/09/2018 46 mm/h (H)  01/02/2018 2 mm/hr  07/18/2017 2 mm/h   CRP  Date Value  07/09/2018 89.8 mg/L (H)  01/02/2018 1.3 mg/dL (H)  07/18/2017 1.8 mg/L    Assessment & Plan:   Problem List Items Addressed This Visit      Unprioritized   PICC (peripherally inserted central catheter) in place    He is doing quite well with PICC - some anxiety with it but mostly related to bigger picture. Site unremarkable, well maintained and functioning as expected. Continue care and maintenance until planned end of IV antibiotics. Home Health team to remove at completion.        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 In June 2018 also a methicillin sensitive coagulase negative staph isolate that was recovered, however this has not been duplicated on subsequent samples. Most recent 2 micro samples yielded no growth (held x 14d). He  has already had 2-stage approach to managing this and unfortunately had signs of  recurrent infection. He today is POD# 23 and has for 8 days had stable but severe 8/10 pain. He has some erythema and swelling at the incision site and deltoid muscle. There is no abscess or organized area of fluctuance and no drainage of incision and only slight warmth. Being that he is just 3 weeks out from a recent of many surgeries I suspect he would have some swelling, however given his track record infection relapse is of course a concern.   I shared photos of shoulder today with Dr. Tommy Medal and Dr. [name] and discussed personally with them both findings today. Will check ESR/CRP/CBC and try to get [facility] labs (they are not loaded into [employer] yet). Only surgical option at this point is to perform explant of hardware and permanent spacer - which understandably he would hope to avoid. He is clearly under a great deal of stress and anxiety over current situation and I reassured him this is perfectly understandable. I considered adding/substiuting out ceftriaxone for vancomycin however after discussed with Dr. Tommy Medal will hold off for now and follow lab results. It would be unlikely that his P acnes would be resistant to beta lactams. Will see if there is any benefit to adding rifampin in this setting and call him with recommendations further. He has been instructed to return to ER should he develop fevers/dizziness/lethargy/tachycardia or other signs of systemic infection.   I will have him return to see me again in 1 week.       Prosthetic shoulder infection, subsequent encounter - Primary   Relevant Orders   C-reactive protein   Sedimentation rate   CBC with Differential/Platelet     Janene Madeira, MSN, NP-C [place] [facility] Pager: [phone] Office: [phone]

## 2018-08-07 NOTE — Assessment & Plan Note (Signed)
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 In June 2018 also a methicillin sensitive coagulase negative staph isolate that was recovered, however this has not been duplicated on subsequent samples. Most recent 2 micro samples yielded no growth (held x 14d). He has already had 2-stage approach to managing this and unfortunately had signs of recurrent infection. He today is POD# 23 and has for 8 days had stable but severe 8/10 pain. He has some erythema and swelling at the incision site and deltoid muscle. There is no abscess or organized area of fluctuance and no drainage of incision and only slight warmth. Being that he is just 3 weeks out from a recent of many surgeries I suspect he would have some swelling, however given his track record infection relapse is of course a concern.   I shared photos of shoulder today with Dr. Tommy Medal and Dr. [name] and discussed personally with them both findings today. Will check ESR/CRP/CBC and try to get [facility] labs (they are not loaded into [employer] yet). Only surgical option at this point is to perform explant of hardware and permanent spacer - which understandably he would hope to avoid. He is clearly under a great deal of stress and anxiety over current situation and I reassured him this is perfectly understandable. I considered adding/substiuting out ceftriaxone for vancomycin however after discussed with Dr. Tommy Medal will hold off for now and follow lab results. It would be unlikely that his P acnes would be resistant to beta lactams. Will see if there is any benefit to adding rifampin in this setting and call him with recommendations further. He has been instructed to return to ER should he develop fevers/dizziness/lethargy/tachycardia or other signs of systemic infection.   I will have him return to see me again in 1 week.

## 2018-08-07 NOTE — Telephone Encounter (Signed)
Very sorry to hear this he has had a really rough time

## 2018-08-07 NOTE — Telephone Encounter (Signed)
Patient walked into office today to review lab work from most recent home health visit. Patient states that he has increase pain in shoulder for the last 8 days, feels tired/has little energy, was informed by home health Rn that his incision site was red. Patient is not sure how long his incision site has been like that.  Spoke with Cassie, Pharmacist who states lab work looks okay. Patient would still like Dr. Tommy Medal to review labs as soon as possible and call if there is anything abnormal.  .Aundria Rud, [place]

## 2018-08-07 NOTE — Patient Instructions (Signed)
I spoke with Dr. Tommy Medal and left word for Dr. [name].   Dr. Tommy Medal would like for Dr. [name] to see your shoulder before we consider changing around your antibiotics for now.   Keep your current follow up appointment with Dr. Tommy Medal for now.

## 2018-08-08 ENCOUNTER — Telehealth: Payer: Self-pay | Admitting: Infectious Diseases

## 2018-08-08 DIAGNOSIS — Z96612 Presence of left artificial shoulder joint: Secondary | ICD-10-CM | POA: Diagnosis not present

## 2018-08-08 DIAGNOSIS — Z471 Aftercare following joint replacement surgery: Secondary | ICD-10-CM | POA: Diagnosis not present

## 2018-08-08 LAB — C-REACTIVE PROTEIN: CRP: 22.5 mg/L — ABNORMAL HIGH (ref <8.0)

## 2018-08-08 LAB — CBC WITH DIFFERENTIAL/PLATELET
Absolute Monocytes: 704 cells/uL (ref 200–950)
Basophils Absolute: 40 cells/uL (ref 0–200)
Basophils Relative: 0.6 %
Eosinophils Absolute: 436 cells/uL (ref 15–500)
Eosinophils Relative: 6.5 %
HCT: 42.2 % (ref 38.5–50.0)
Hemoglobin: 15 g/dL (ref 13.2–17.1)
Lymphs Abs: 1139 cells/uL (ref 850–3900)
MCH: 32.1 pg (ref 27.0–33.0)
MCHC: 35.5 g/dL (ref 32.0–36.0)
MCV: 90.4 fL (ref 80.0–100.0)
MPV: 11.8 fL (ref 7.5–12.5)
Monocytes Relative: 10.5 %
Neutro Abs: 4382 cells/uL (ref 1500–7800)
Neutrophils Relative %: 65.4 %
Platelets: 215 10*3/uL (ref 140–400)
RBC: 4.67 10*6/uL (ref 4.20–5.80)
RDW: 12.6 % (ref 11.0–15.0)
Total Lymphocyte: 17 %
WBC: 6.7 10*3/uL (ref 3.8–10.8)

## 2018-08-08 LAB — SEDIMENTATION RATE: Sed Rate: 45 mm/h — ABNORMAL HIGH (ref 0–20)

## 2018-08-08 MED ORDER — RIFAMPIN 300 MG PO CAPS: 300.0000 mg | ORAL_CAPSULE | Freq: Two times a day (BID) | ORAL | 2 refills | Status: DC

## 2018-08-08 NOTE — Telephone Encounter (Signed)
Thank you for giving him some reassurance on the dosing. I gave him either option so as long as he can tolerate it.  Fingers crossed it works.

## 2018-08-08 NOTE — Telephone Encounter (Signed)
Called to discuss results - his inflammatory markers have been quite up and down   2 weeks ago they had normalized then last week increased which does correlate to his increased pain symptoms and concern for re-emergence of infection.   08/05/18 - ESR (not able to run sample) CRP 36 08/08/18 - ESR 45    CRP 22.5  I explained to Tommy that we need to take into most account his symptoms which helps make most sense of lab trends. Discussed with Dr. Tommy Medal and Dr. [name] - will add rifampin 600 mg QD to ceftriaxone to see if any benefit; there is some literature to support the use with beta-lactam in the setting of p acnes PJI.   He tolerated this well last time and will take it 600 mg once a day with breakfast. At this point only other surgical option will be explant and permanent spacer. Will see him back in clinic in 1 week to follow symptoms.   Reviewed medication list and I think the only thing he is taking currently that may need increased dose would be propanolol for migraine prophylaxis - will discuss with Cassie. Counseled on side effects of rifampin therapy. Will ask advanced home care to add LFTs starting Feb 24th.   Janene Madeira, MSN, NP-C [facility] [email or web address] Pager: [phone] Office: [phone]

## 2018-08-08 NOTE — Telephone Encounter (Signed)
The only drug interactions I see with rifampin are with the propranolol (just like you said) and with his clonazepam. They are minor interactions (not contraindicated) and say that an increase in dose MAY be needed. We can have him monitor and if he thinks it isn't working as well and may need an increase in dose, we can do it at that time.  No need to increase clonazepam since he takes that PRN.   He did just call me with questions about the rifampin and how to take it.  Clarified that it needs to be taken without food on an empty stomach at least 1 hour before or 2 hours after a meal. He can take the 600 mg daily (2 tabs) at once or if he gets nauseous or has diarrhea, he can separate the two and take twice daily.  He wanted to confirm since the prescriptions says BID.  Warned him again about the possibility of turning his tears/salvia/urine orange/red. I told him to call me with any additional questions.

## 2018-08-12 DIAGNOSIS — T8459XA Infection and inflammatory reaction due to other internal joint prosthesis, initial encounter: Secondary | ICD-10-CM | POA: Diagnosis not present

## 2018-08-13 NOTE — Progress Notes (Signed)
Patient: Geoffrey West  DOB: Oct 14, 1955 MRN: [id number] PCP: Orpah Melter, MD    Patient Active Problem List   Diagnosis Date Noted  . Nausea 08/14/2018  . PICC (peripherally inserted central catheter) in place 08/07/2018  . Prosthetic shoulder infection, subsequent encounter 03/06/2018  . 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 Hypertension 12/25/2016  . Low testosterone 12/25/2016  . Gout 12/25/2016  . 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 Chronic pain syndrome 07/05/2013  . Encounter for therapeutic drug monitoring 07/05/2013  . Radiculopathy, lumbosacral region 07/05/2013     Subjective:  Geoffrey West is a 63 y.o. 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   HPI:  He is now 1 week out since adding RIF 600 mg QD to Ceftriaxone to treat this infection. He is POD# 30 today. He says that over the last week he has been able to manage with less pain medication than the prior week and was starting to feel better up until yesterday. He feels that the shoulder is swollen again but no worsening or new symptoms to report.   He has had some stomach queasiness since adding the rifampin. This is settled by the time he has food 2h after taking the medication. He does not have much taste for food. No fevers/chills or drainage at the shoulder incision.   Review of Systems  Constitutional: Negative for chills and fever.  Respiratory: Negative for cough.   Cardiovascular: Negative for chest pain and leg swelling.  Gastrointestinal: Negative for abdominal pain, diarrhea and vomiting.  Genitourinary: Negative for flank pain.  Musculoskeletal: Positive for joint pain (L shoulder ).       PICC line is without pain, drainage or erythema and is well maintained by [facility] Team. No swelling or altered sensation in affected  distal extremity.  Left shoulder incision pictured below.   Skin: Negative for rash.  Neurological: Negative for dizziness, tingling and headaches.  Psychiatric/Behavioral: The patient is nervous/anxious. The patient does not have insomnia.     Past Medical History:  Diagnosis Date  . Anxiety   . Arthritis    "all over" (03/06/2018)  . GERD (gastroesophageal reflux disease)   . Gout    "on daily RX" (03/06/2018)  . Hypertension   . Hypertension 12/25/2016  . Low testosterone 12/25/2016  . Migraine    "haven't had more than 1/year since propanolol started" (03/06/2018)  . 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 Situational depression    "recently lost my mom and brother" (03/06/2018)    Outpatient Medications Prior to Visit  Medication Sig Dispense Refill  . allopurinol (ZYLOPRIM) 300 MG tablet Take 300 mg by mouth daily.    [name] amLODipine (NORVASC) 10 MG tablet Take 10 mg by mouth at bedtime.     . cefTRIAXone (ROCEPHIN) IVPB Inject 2 g into the vein daily. Indication:  Prosthetic Joint Infection Last Day of Therapy:  August 27, 2018 Labs - Once weekly:  CBC/D and BMP, Labs - Every other week:  ESR and CRP 37 Units 0  . celecoxib (CELEBREX) 200 MG capsule Take 200 mg by mouth 2 (two) times daily.  3  . clonazePAM (KLONOPIN) 1 MG tablet Take 1 mg by mouth daily as needed for anxiety.    . Coenzyme Q10 (COQ-10) 200 MG CAPS Take 200  mg by mouth daily.    . fluticasone (FLONASE) 50 MCG/ACT nasal spray Place 2 sprays into both nostrils daily as needed for allergies.   3  . gabapentin (NEURONTIN) 100 MG capsule Take 100 mg by mouth 3 (three) times daily.    . Magnesium 400 MG TABS Take 400 mg by mouth daily.    . meloxicam (MOBIC) 7.5 MG tablet Take 7.5 mg by mouth daily.    . Multiple Vitamin (MULTIVITAMIN WITH MINERALS) TABS tablet Take 1 tablet by mouth daily.    . pantoprazole (PROTONIX) 20 MG tablet Take 20 mg by mouth every evening.    . propranolol ER (INDERAL LA) 80 MG 24 hr  capsule Take 80 mg by mouth every evening.   1  . ranitidine (ZANTAC) 150 MG tablet Take 150 mg by mouth daily as needed for heartburn.    . rifampin (RIFADIN) 300 MG capsule Take 1 capsule (300 mg total) by mouth 2 (two) times daily. 60 capsule 2  . testosterone cypionate (DEPOTESTOSTERONE CYPIONATE) 200 MG/ML injection Inject 100 mg into the muscle every Wednesday.   5  . traMADol (ULTRAM) 50 MG tablet Take 1 tablet (50 mg total) by mouth every 6 (six) hours as needed for moderate pain. 50 tablet 0  . traMADol (ULTRAM) 50 MG tablet Take 1 tablet (50 mg total) by mouth every 6 (six) hours as needed for moderate pain. 50 tablet 0  . ALPRAZolam (XANAX) 0.25 MG tablet Take 1 tablet (0.25 mg total) by mouth 3 (three) times daily as needed for anxiety. (Patient not taking: Reported on 08/14/2018) 30 tablet 0  . ondansetron (ZOFRAN ODT) 4 MG disintegrating tablet Take 1 tablet (4 mg total) by mouth every 8 (eight) hours as needed. (Patient not taking: Reported on 08/14/2018) 20 tablet 0   No facility-administered medications prior to visit.      Allergies  Allergen Reactions  . Fentanyl Shortness Of Breath and Other (See Comments)    Reaction to [facility] ONLY > ? DOSE REGULATION ?   [name] Oxycodone Other (See Comments)    Pt states he goes through hot and cold flashes withdrawal like symptoms    Social History   Tobacco Use  . Smoking status: Never Smoker  . Smokeless tobacco: Never Used  Substance Use Topics  . Alcohol use: Not Currently    Comment: 03/06/2018 "max of 2 drinks/year; if that"  . Drug use: Never    Objective:   Vitals:   08/14/18 0938  BP: 129/88  Pulse: 83  Temp: 98.3 F (36.8 C)  Weight: 212 lb (96.2 kg)   Body mass index is 29.57 kg/m.  Physical Exam Constitutional:      Appearance: Normal appearance. He is not ill-appearing or toxic-appearing.  Cardiovascular:     Rate and Rhythm: Normal rate and regular rhythm.     Heart sounds: Normal heart sounds.    Pulmonary:     Breath sounds: Normal breath sounds.  Musculoskeletal:     Comments: Left shoulder incision as pictured - there is at least stable but maybe less hyperemia around the surgical incision, no warmth. Surrounding tissues feel less swollen to me; light flaking/scaling of skin around shoulder.   Skin:    Capillary Refill: Capillary refill takes less than 2 seconds.     Comments: RUE PICC line - clean/dry dressing. Insertion site w/o erythema, tenderness, drainage, cording or distal swelling of affected extremity    Neurological:     Mental Status: He is  alert.   08/14/18    08/07/18   Lab Results: Lab Results  Component Value Date   WBC 6.7 08/07/2018   HGB 15.0 08/07/2018   HCT 42.2 08/07/2018   MCV 90.4 08/07/2018   PLT 215 08/07/2018    Lab Results  Component Value Date   CREATININE 1.52 (H) 07/17/2018   BUN 27 (H) 07/17/2018   NA 137 07/17/2018   K 4.0 07/17/2018   CL 103 07/17/2018   CO2 25 07/17/2018    Lab Results  Component Value Date   ALT 19 02/06/2017   AST 21 02/06/2017   ALKPHOS 75 02/06/2017   BILITOT 0.6 02/06/2017    Sed Rate  Date Value  08/07/2018 45 mm/h (H)  07/09/2018 46 mm/h (H)  01/02/2018 2 mm/hr   CRP  Date Value  08/07/2018 22.5 mg/L (H)  07/09/2018 89.8 mg/L (H)  01/02/2018 1.3 mg/dL (H)    Assessment & Plan:   Problem List Items Addressed This Visit      Unprioritized   Encounter for therapeutic drug monitoring    OPAT labs reviewd - bmp and cbc stable.  Will check ESR/CRP for trend.  LFTs today with addition of RIF.       Nausea    Send in rx for ODT zofran to take 35mbefore RIF.       PICC (peripherally inserted central catheter) in place    Site unremarkable, well maintained and functioning as expected. Continue care and maintenance until planned end of IV antibiotics. Home Health team to remove at completion.        Prosthetic shoulder infection ([place]    He has had some good days and some signs of  improvement since adding rifampin. Will continue on current course for another 3 weeks of dual therapy and have him return to see Dr. VTommy Medal [facility] through [facility] next visit scheduled March 6th. Only other surgical option will be a permanent spacer and explant of hardware.       Prosthetic shoulder infection, subsequent encounter - Primary   Relevant Orders   Hepatic function panel   C-reactive protein   Sedimentation rate     SJanene Madeira MSN, NP-C RHenry Ford Macomb Hospital-Mt Clemens Campusfor Infectious DArdmorePager: [phone]: [phone]

## 2018-08-14 ENCOUNTER — Encounter: Payer: Self-pay | Admitting: Infectious Diseases

## 2018-08-14 ENCOUNTER — Ambulatory Visit (INDEPENDENT_AMBULATORY_CARE_PROVIDER_SITE_OTHER): Payer: PPO | Admitting: Infectious Diseases

## 2018-08-14 VITALS — BP 129/88 | HR 83 | Temp 98.3°F | Wt 212.0 lb

## 2018-08-14 DIAGNOSIS — T8459XD Infection and inflammatory reaction due to other internal joint prosthesis, subsequent encounter: Secondary | ICD-10-CM | POA: Diagnosis not present

## 2018-08-14 DIAGNOSIS — Z96619 Presence of unspecified artificial shoulder joint: Secondary | ICD-10-CM | POA: Diagnosis not present

## 2018-08-14 DIAGNOSIS — Z452 Encounter for adjustment and management of vascular access device: Secondary | ICD-10-CM

## 2018-08-14 DIAGNOSIS — Z5181 Encounter for therapeutic drug level monitoring: Secondary | ICD-10-CM

## 2018-08-14 DIAGNOSIS — R110 Nausea: Secondary | ICD-10-CM | POA: Diagnosis not present

## 2018-08-14 MED ORDER — ONDANSETRON 4 MG PO TBDP: 4.0000 mg | ORAL_TABLET | Freq: Three times a day (TID) | ORAL | 1 refills | Status: DC | PRN

## 2018-08-14 NOTE — Patient Instructions (Addendum)
For your rifampin - Pharmacist is correct please take on empty stomach if you are able to tolerate it this way for maximal absorption.   Will send in zofran disintegrating tablet to take 30 min before your rifampin   Will have you reschedule with Dr. Tommy Medal in 3 weeks and give you more time on both the antibiotics together  Will call you with today's lab results.

## 2018-08-14 NOTE — Assessment & Plan Note (Signed)
OPAT labs reviewd - bmp and cbc stable.  Will check ESR/CRP for trend.  LFTs today with addition of RIF.

## 2018-08-14 NOTE — Assessment & Plan Note (Addendum)
He has had some good days and some signs of improvement since adding rifampin. Will continue on current course for another 3 weeks of dual therapy and have him return to see Dr. Tommy Medal. Will have [facility] keep going with antibiotics through his next visit scheduled March 6th. Only other surgical option will be a permanent spacer and explant of hardware.

## 2018-08-14 NOTE — Assessment & Plan Note (Signed)
Send in rx for ODT zofran to take 60m before RIF.

## 2018-08-14 NOTE — Assessment & Plan Note (Signed)
Site unremarkable, well maintained and functioning as expected. Continue care and maintenance until planned end of IV antibiotics. Home Health team to remove at completion.   

## 2018-08-15 ENCOUNTER — Telehealth: Payer: Self-pay

## 2018-08-15 LAB — HEPATIC FUNCTION PANEL
AG Ratio: 1.4 (calc) (ref 1.0–2.5)
ALT: 13 U/L (ref 9–46)
AST: 14 U/L (ref 10–35)
Albumin: 4 g/dL (ref 3.6–5.1)
Alkaline phosphatase (APISO): 142 U/L (ref 35–144)
BILIRUBIN DIRECT: 0.2 mg/dL (ref 0.0–0.2)
Globulin: 2.9 g/dL (calc) (ref 1.9–3.7)
Indirect Bilirubin: 0.6 mg/dL (calc) (ref 0.2–1.2)
Total Bilirubin: 0.8 mg/dL (ref 0.2–1.2)
Total Protein: 6.9 g/dL (ref 6.1–8.1)

## 2018-08-15 LAB — SEDIMENTATION RATE: Sed Rate: 33 mm/h — ABNORMAL HIGH (ref 0–20)

## 2018-08-15 LAB — C-REACTIVE PROTEIN: CRP: 12.3 mg/L — ABNORMAL HIGH (ref <8.0)

## 2018-08-15 NOTE — Telephone Encounter (Signed)
-----   Message from Valdese Callas, NP sent at 08/14/2018 12:46 PM EST ----- Please call [facility] to continue his IV ceftriaxone through March 11th.

## 2018-08-15 NOTE — Telephone Encounter (Signed)
Per Janene Madeira NP. Called [facility], spoke to [place] and gave new orders to continue IV Ceftriaxone until March 11th.  Jeani Hawking confirmed understanding with feedback.  Lenore Cordia, [place]

## 2018-08-15 NOTE — Progress Notes (Signed)
Markers with some improvement after adding rifampin to ceftriaxone; he is having some clinical improvement as well over the last week. Will continue current plan and follow up again in 3 weeks time.  I called Tommy to discuss the findings.

## 2018-08-15 NOTE — Telephone Encounter (Signed)
Thank you :)

## 2018-08-19 DIAGNOSIS — T8459XA Infection and inflammatory reaction due to other internal joint prosthesis, initial encounter: Secondary | ICD-10-CM | POA: Diagnosis not present

## 2018-08-21 ENCOUNTER — Ambulatory Visit: Payer: PPO | Admitting: Infectious Disease

## 2018-08-26 DIAGNOSIS — Z96612 Presence of left artificial shoulder joint: Secondary | ICD-10-CM | POA: Diagnosis not present

## 2018-08-26 DIAGNOSIS — I10 Essential (primary) hypertension: Secondary | ICD-10-CM | POA: Diagnosis not present

## 2018-08-26 DIAGNOSIS — M1990 Unspecified osteoarthritis, unspecified site: Secondary | ICD-10-CM | POA: Diagnosis not present

## 2018-08-26 DIAGNOSIS — K219 Gastro-esophageal reflux disease without esophagitis: Secondary | ICD-10-CM | POA: Diagnosis not present

## 2018-08-26 DIAGNOSIS — B9689 Other specified bacterial agents as the cause of diseases classified elsewhere: Secondary | ICD-10-CM | POA: Diagnosis not present

## 2018-08-26 DIAGNOSIS — F419 Anxiety disorder, unspecified: Secondary | ICD-10-CM | POA: Diagnosis not present

## 2018-08-26 DIAGNOSIS — Z5181 Encounter for therapeutic drug level monitoring: Secondary | ICD-10-CM | POA: Diagnosis not present

## 2018-08-26 DIAGNOSIS — Z96611 Presence of right artificial shoulder joint: Secondary | ICD-10-CM | POA: Diagnosis not present

## 2018-08-26 DIAGNOSIS — Z792 Long term (current) use of antibiotics: Secondary | ICD-10-CM | POA: Diagnosis not present

## 2018-08-26 DIAGNOSIS — Z452 Encounter for adjustment and management of vascular access device: Secondary | ICD-10-CM | POA: Diagnosis not present

## 2018-08-26 DIAGNOSIS — M109 Gout, unspecified: Secondary | ICD-10-CM | POA: Diagnosis not present

## 2018-08-26 DIAGNOSIS — Z96653 Presence of artificial knee joint, bilateral: Secondary | ICD-10-CM | POA: Diagnosis not present

## 2018-08-26 DIAGNOSIS — T8459XA Infection and inflammatory reaction due to other internal joint prosthesis, initial encounter: Secondary | ICD-10-CM | POA: Diagnosis not present

## 2018-08-27 DIAGNOSIS — F1123 Opioid dependence with withdrawal: Secondary | ICD-10-CM | POA: Diagnosis not present

## 2018-08-28 DIAGNOSIS — T8140XA Infection following a procedure, unspecified, initial encounter: Secondary | ICD-10-CM | POA: Diagnosis not present

## 2018-08-30 ENCOUNTER — Encounter: Payer: Self-pay | Admitting: Infectious Disease

## 2018-08-30 ENCOUNTER — Ambulatory Visit (INDEPENDENT_AMBULATORY_CARE_PROVIDER_SITE_OTHER): Payer: PPO | Admitting: Infectious Disease

## 2018-08-30 VITALS — BP 138/90 | HR 80 | Temp 97.8°F

## 2018-08-30 DIAGNOSIS — Z96619 Presence of unspecified artificial shoulder joint: Secondary | ICD-10-CM | POA: Diagnosis not present

## 2018-08-30 DIAGNOSIS — T8459XD Infection and inflammatory reaction due to other internal joint prosthesis, subsequent encounter: Secondary | ICD-10-CM | POA: Diagnosis not present

## 2018-08-30 DIAGNOSIS — Z96612 Presence of left artificial shoulder joint: Secondary | ICD-10-CM

## 2018-08-30 DIAGNOSIS — M5417 Radiculopathy, lumbosacral region: Secondary | ICD-10-CM

## 2018-08-30 MED ORDER — RIFAMPIN 300 MG PO CAPS: 300.0000 mg | ORAL_CAPSULE | Freq: Two times a day (BID) | ORAL | 7 refills | Status: DC

## 2018-08-30 MED ORDER — CEPHALEXIN 500 MG PO CAPS: 500.0000 mg | ORAL_CAPSULE | Freq: Four times a day (QID) | ORAL | 11 refills | Status: DC

## 2018-08-30 NOTE — Progress Notes (Signed)
Subjective:    Chief complaint: Follow-up for infected prosthetic shoulder    Patient ID: Geoffrey West, male    DOB: 13-Jul-1955, 63 y.o.   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 He was taken to OR by Dr. [name] on June 15th of 2018 and had shoulder reviisino and reversal of total shoulder arthroplasty. 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   I am for the first time in December 25, 2016 he actually was having no pain whatsoever.   We worked him into clinic urgently after phone call from Dr. [name] arrange for placement of a PICC line.  However he did not like the PICC line and we ended up changing him to oral antibiotics in the form of   We worked to place  IV ceftriaxone 2grams daily to cover the P acnes and the Coag Neg Staph plus rifampin 300 mg BID  Zyvox and rifampin but had trouble tolerating this regimen as well in particular the rifampin which we then discontinued and ultimately transitioned him to Augmentin. I planned him  to have 6 months of therapy and emphasized that we should see him again in January 2019 which would be the six-month mark of treatment of his prosthetic joint infection.  I saw him in January 2019 and his inflammatory markers were reassuring and he was not having pain.  Unfortunately after this he was admitted in the summertime after he had developed severe pain and was found on CT scan to have glenoid component screws that had loosened with a lucency between the inferior portion of the glenoid component and just glenoid bone with large amounts of fluid in the subacromial and subdeltoid bursa.  To the operating room on July 9 and underwent explantation of all components of his left reverse shoulder arthroplasty with  insertion of antibiotic spacer in the left shoulder with open reduction internal internal fixation of a left proximal humerus fracture.  [name] did mention his operative note that there was problems with the superior and anterior peripheral locking screws which are fractured.  He said they were not able to retrieve the tip of the screw as it was deep in the glenoid vault.  Cultures were sent and the patient then did grow Propionibacterium on all 4 specimens were sent.  Patient was treated with ceftriaxone as an inpatient also had gotten vancomycin.  He was ultimately discharged on oral Keflex 500 mg 4 times daily because he did not want have a PICC line again.  Unfortunately not gain control of infection with the surgery and antibiotics and he had to go back to the operating room yet again timbre underwent she underwent explantation of his humeral antibiotic spacer, bone grafting of the left glenoid fossa bone defect excisional debridement of bone muscle subcutaneous tissue and placement of a left antibiotic spacer.  Ulcers done in September on antibiotics failed to yield an organism.  He ultimately was taken back to the operating room in December still on oral Keflex per our recommendations and underwent plan of his humeral antibiotic spacer and reversal of his total shoulder revision.  He was doing well postoperatively and had hardly any pain and minimal need for any pain meds.  He states he only took a few doses of tramadol.  However  in the last week he is developed severe pain in his left shoulder.  He wondereed if it could have been due to the fact that he is now going through physical therapy and rehab and he also had a massage where his shoulder was down.  He stated he had pain there all the time but is worse when he moves the shoulder and he rated it as being dull deep but also sharp and can be up to 8 or 9 out of 10 in severity.  He  was trying to manage this with gabapentin and nonsteroidal along with  tramadol but he was not achieving adequate pain control.  We checked his inflammatory markers his sed rate had climbed to 46 and a CRP had gone to the 9.8 I was very concerned about this as well as Dr. [name].  He was ultimately taken to the operating room January 21 and underwent excisional and non-excisional debridement of infected left shoulder prosthesis including skin subcutaneous tissue muscle and fascia.  A reverse total shoulder revision was performed with polyethylene and glenosphere head ball of 2 components.  Was taken the operating room failed to grow any organisms anaerobically and are being still held for 2 weeks.  There is no growth at 7 days.  I doubt we will grow an organism given he was on antibiotics previously and the media is not designed to pick up nontuberculous mycobacteria nocardia or nocardia terribly well, should be microorganisms I would be thinking would take that long to grow potentially.  Her pain had improved since being in the hospital.  He was seen by my partner Dr. [name] who changed the patient from IV vancomycin to 2 g of ceftriaxone daily.  The patient as mentioned had previously been unwilling to try IV antibiotics at this time is been tolerating him.  His PICC line was  clean dry and intact.  As mentioned his pain is improved relative to when he was in the hospital he is proceeding with some physical therapy.  I told him that he would need to be on some oral antibiotics chronically after completing his antibiotics.  In the interval he had worsening of his shoulder pain again and in early February was having 8 out of 10 pain.  He was a postop day #23.  He also had increased swelling and erythema at the area and was seen by [name] NP.  Add rifampin and then saw him again on 19 February where he clinically had improved and his sed rate and CRP also came down in the interim.  He continues to feel better and says he feels like his pain is much much better his  range of motion is continuing to improve.  Some abdominal discomfort with rifampin but is managing to tolerate it he wears contacts but has not noticed staining of his contacts yet.  He has roughly another week left of the antibiotics.  I want to recheck his inflammatory markers today and then if he is doing well change him to Keflex and rifampin for protracted period of time.     Past Medical History:  Diagnosis Date  . Anxiety   . Arthritis    "all over" (03/06/2018)  . GERD (gastroesophageal reflux disease)   . Gout    "on daily RX" (03/06/2018)  . Hypertension   . Hypertension 12/25/2016  . Low testosterone 12/25/2016  . Migraine    "haven't had more than 1/year since propanolol started" (03/06/2018)  . 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 Situational depression    "recently lost my mom and brother" (03/06/2018)    Past Surgical History:  Procedure Laterality Date  . BACK SURGERY    . COLONOSCOPY    . COLONOSCOPY W/ BIOPSIES AND POLYPECTOMY    . EXCISIONAL TOTAL SHOULDER ARTHROPLASTY WITH ANTIBIOTIC SPACER Left 03/06/2018   Procedure: Left shoulder antibiotic spacer expalnt with glenoid bone grafting, antiobiotic spacer placement;  Surgeon: [name], MD;  Location: [facility];  Service: Orthopedics;  Laterality: Left;  . IR FLUORO GUIDE CV LINE RIGHT  12/26/2016  . JOINT REPLACEMENT    . KNEE ARTHROSCOPY Bilateral    X 10 total arthroscopies  . KNEE CARTILAGE SURGERY Left 1977   "cartilage removed"  . LASIK Bilateral   . LUMBAR DISC SURGERY  2000s   "ruptured disc"  . REVERSE SHOULDER ARTHROPLASTY Left 12/08/2016   Procedure: Revision left total shoulder to reverse total shoulder arthroplasty;  Surgeon: [name], MD;  Location: [facility];  Service: Orthopedics;  Laterality: Left;  . REVERSE SHOULDER ARTHROPLASTY Left 01/01/2018   Procedure: Left reverse shouler explant with antibiotic spacer placement;  Surgeon: [name], MD;  Location: [facility];  Service: Orthopedics;  Laterality: Left;  2.5 hrs  . REVERSE SHOULDER ARTHROPLASTY Left 06/11/2018   Procedure: REVERSE TOTAL SHOULDER REVISION;  Surgeon: [name], MD;  Location: [facility];  Service: Orthopedics;  Laterality: Left;  . REVISION TOTAL SHOULDER TO REVERSE TOTAL SHOULDER Left 07/16/2018   Procedure: Left shoulder irrigation and debridement with poly and head ball exchange;  Surgeon: [name], MD;  Location: [facility];  Service: Orthopedics;  Laterality: Left;  2 hrs  . SHOULDER ARTHROSCOPY Right 1980s/1990s   "bone spur removed"  . SHOULDER OPEN ROTATOR CUFF REPAIR Right 1970s  . SHOULDER SURGERY Left 03/06/2018   antibiotic spacer expalnt with glenoid bone grafting, antiobiotic spacer placement  . TOTAL KNEE ARTHROPLASTY Bilateral   . TOTAL SHOULDER ARTHROPLASTY Left ~ 2014-2017 X 2   "in [place]"  . TOTAL SHOULDER REPLACEMENT Right ~ 2009   "in [place]"  . WISDOM TOOTH EXTRACTION      Family History  Problem Relation Age of Onset  . Arthritis Mother   . Arthritis Sister   . Alzheimer's disease Brother   . Arthritis Brother       Social History   Socioeconomic History  . Marital status: Single    Spouse name: Not on file  . Number of children: Not on file  . Years of education: Not on file  . Highest education level: Not on file  Occupational History  . Not on file  Social Needs  . Financial resource strain: Not on file  . Food insecurity:    Worry: Not on file    Inability: Not on file  . Transportation needs:    Medical: Not on file    Non-medical: Not on file  Tobacco Use  . Smoking status: Never Smoker  . Smokeless tobacco: Never Used  Substance and Sexual Activity  . Alcohol use: Not Currently    Comment: 03/06/2018 "max of 2 drinks/year; if that"  . Drug use: Never  . Sexual activity: Not Currently  Lifestyle  . Physical activity:    Days per week: Not on file    Minutes per session: Not on file  . Stress: Not on  file  Relationships  . Social connections:    Talks on phone: Not on file  Gets together: Not on file    Attends religious service: Not on file    Active member of club or organization: Not on file    Attends meetings of clubs or organizations: Not on file    Relationship status: Not on file  Other Topics Concern  . Not on file  Social History Narrative  . Not on file    Allergies  Allergen Reactions  . Fentanyl Shortness Of Breath and Other (See Comments)    Reaction to [facility] ONLY > ? DOSE REGULATION ?   [name] Oxycodone Other (See Comments)    Pt states he goes through hot and cold flashes withdrawal like symptoms     Current Outpatient Medications:  .  allopurinol (ZYLOPRIM) 300 MG tablet, Take 300 mg by mouth daily., Disp: , Rfl:  .  amLODipine (NORVASC) 10 MG tablet, Take 10 mg by mouth at bedtime. , Disp: , Rfl:  .  celecoxib (CELEBREX) 200 MG capsule, Take 200 mg by mouth 2 (two) times daily., Disp: , Rfl: 3 .  clonazePAM (KLONOPIN) 1 MG tablet, Take 1 mg by mouth daily as needed for anxiety., Disp: , Rfl:  .  Coenzyme Q10 (COQ-10) 200 MG CAPS, Take 200 mg by mouth daily., Disp: , Rfl:  .  fluticasone (FLONASE) 50 MCG/ACT nasal spray, Place 2 sprays into both nostrils daily as needed for allergies. , Disp: , Rfl: 3 .  Magnesium 400 MG TABS, Take 400 mg by mouth daily., Disp: , Rfl:  .  Multiple Vitamin (MULTIVITAMIN WITH MINERALS) TABS tablet, Take 1 tablet by mouth daily., Disp: , Rfl:  .  pantoprazole (PROTONIX) 20 MG tablet, Take 20 mg by mouth every evening., Disp: , Rfl:  .  propranolol ER (INDERAL LA) 80 MG 24 hr capsule, Take 80 mg by mouth every evening. , Disp: , Rfl: 1 .  ranitidine (ZANTAC) 150 MG tablet, Take 150 mg by mouth daily as needed for heartburn., Disp: , Rfl:  .  rifampin (RIFADIN) 300 MG capsule, Take 1 capsule (300 mg total) by mouth 2 (two) times daily., Disp: 60 capsule, Rfl: 2 .  testosterone cypionate (DEPOTESTOSTERONE CYPIONATE) 200 MG/ML  injection, Inject 100 mg into the muscle every Wednesday. , Disp: , Rfl: 5 .  ALPRAZolam (XANAX) 0.25 MG tablet, Take 1 tablet (0.25 mg total) by mouth 3 (three) times daily as needed for anxiety. (Patient not taking: Reported on 08/14/2018), Disp: 30 tablet, Rfl: 0 .  gabapentin (NEURONTIN) 100 MG capsule, Take 100 mg by mouth 3 (three) times daily., Disp: , Rfl:  .  meloxicam (MOBIC) 7.5 MG tablet, Take 7.5 mg by mouth daily., Disp: , Rfl:  .  ondansetron (ZOFRAN-ODT) 4 MG disintegrating tablet, Take 1 tablet (4 mg total) by mouth every 8 (eight) hours as needed for nausea or vomiting (take 30 min before your rifampin). (Patient not taking: Reported on 08/30/2018), Disp: 40 tablet, Rfl: 1 .  traMADol (ULTRAM) 50 MG tablet, Take 1 tablet (50 mg total) by mouth every 6 (six) hours as needed for moderate pain. (Patient not taking: Reported on 08/30/2018), Disp: 50 tablet, Rfl: 0   Review of Systems  Constitutional: Negative for chills and fever.  HENT: Negative for congestion and sore throat.   Eyes: Negative for photophobia.  Respiratory: Negative for cough, shortness of breath and wheezing.   Cardiovascular: Negative for chest pain, palpitations and leg swelling.  Gastrointestinal: Negative for abdominal pain, blood in stool, constipation, diarrhea, nausea and vomiting.  Genitourinary: Negative for  dysuria, flank pain and hematuria.  Musculoskeletal: Positive for arthralgias. Negative for back pain and myalgias.  Skin: Positive for wound. Negative for rash.  Neurological: Negative for dizziness, weakness and headaches.  Hematological: Does not bruise/bleed easily.  Psychiatric/Behavioral: Negative for suicidal ideas.       Objective:   Physical Exam  Constitutional: He is oriented to person, place, and time. He appears well-developed and well-nourished. No distress.  HENT:  Head: Normocephalic and atraumatic.  Mouth/Throat: No oropharyngeal exudate.  Eyes: Conjunctivae and EOM are normal.  No scleral icterus.  Neck: Normal range of motion. Neck supple.  Cardiovascular: Normal rate and regular rhythm.  Pulmonary/Chest: Effort normal. No respiratory distress. He has no wheezes.  Abdominal: He exhibits no distension.  Musculoskeletal:        General: No tenderness or edema.     Comments: His improve range of motion versus the last time I saw him and his surgical scar is well-healed  Neurological: He is alert and oriented to person, place, and time. He exhibits normal muscle tone. Coordination normal.  Skin: Skin is warm and dry. No rash noted. He is not diaphoretic. No erythema. No pallor.  Psychiatric: He has a normal mood and affect. His behavior is normal. Judgment and thought content normal.   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     We will need to have protracted oral antimicrobial therapy

## 2018-08-31 LAB — SEDIMENTATION RATE: Sed Rate: 33 mm/h — ABNORMAL HIGH (ref 0–20)

## 2018-08-31 LAB — C-REACTIVE PROTEIN: CRP: 34.4 mg/L — ABNORMAL HIGH (ref <8.0)

## 2018-09-02 DIAGNOSIS — K30 Functional dyspepsia: Secondary | ICD-10-CM | POA: Diagnosis not present

## 2018-09-02 DIAGNOSIS — T8459XA Infection and inflammatory reaction due to other internal joint prosthesis, initial encounter: Secondary | ICD-10-CM | POA: Diagnosis not present

## 2018-09-02 DIAGNOSIS — R05 Cough: Secondary | ICD-10-CM | POA: Diagnosis not present

## 2018-09-02 DIAGNOSIS — F1123 Opioid dependence with withdrawal: Secondary | ICD-10-CM | POA: Diagnosis not present

## 2018-09-03 ENCOUNTER — Encounter (HOSPITAL_COMMUNITY): Payer: Self-pay | Admitting: Emergency Medicine

## 2018-09-03 ENCOUNTER — Emergency Department (HOSPITAL_COMMUNITY)
Admission: EM | Admit: 2018-09-03 | Discharge: 2018-09-03 | Disposition: A | Discharge status: Home / Self Care | Payer: PPO | Attending: Emergency Medicine | Admitting: Emergency Medicine

## 2018-09-03 ENCOUNTER — Telehealth: Payer: Self-pay | Admitting: *Deleted

## 2018-09-03 ENCOUNTER — Other Ambulatory Visit: Payer: Self-pay

## 2018-09-03 DIAGNOSIS — R05 Cough: Secondary | ICD-10-CM | POA: Insufficient documentation

## 2018-09-03 DIAGNOSIS — T8459XS Infection and inflammatory reaction due to other internal joint prosthesis, sequela: Secondary | ICD-10-CM

## 2018-09-03 DIAGNOSIS — Z5321 Procedure and treatment not carried out due to patient leaving prior to being seen by health care provider: Secondary | ICD-10-CM | POA: Insufficient documentation

## 2018-09-03 DIAGNOSIS — R110 Nausea: Secondary | ICD-10-CM | POA: Diagnosis not present

## 2018-09-03 DIAGNOSIS — Z96619 Presence of unspecified artificial shoulder joint: Principal | ICD-10-CM

## 2018-09-03 DIAGNOSIS — G4700 Insomnia, unspecified: Secondary | ICD-10-CM | POA: Diagnosis not present

## 2018-09-03 NOTE — ED Notes (Signed)
After triage pt asked if he was going to have to wait and then told this RN he did not want to wait. Pt stated he would go back to his PCP in the morning. Pt did not wish to speak to a provider before leaving.

## 2018-09-03 NOTE — Telephone Encounter (Signed)
Patient walked in today. He was in ER last night (Left without being seen due to high patient volume), went to primary care physician this morning.  He came to [facility] after seeing his primary care this morning as a walk-in.  He reports continued nausea, diarrhea, general malaise with the antibiotics (states that his has been diarrhea dark or black since starting antibiotic therapy after surgery).  He was recently treated by primary care for 1) upper respiratory infection (given steroid injection by pcp per his report, has started flonase and Over the counter allergy pill for symptoms), 2) tramadol withdrawal x 1 week (given clonidine and temazepam by pcp for symptoms), 3) worsening acid reflux (taking pantoprozole prescribed by pcp).  He states the ondansetron given by [facility] for nausea associated with rifampin is not working, so his primary care doctor sent in something else today (he does not know what).  He would like to know lab results, if the plan is to follow through with PICC removal tomorrow and transition to oral antibiotics. Per patient, he was given proair inhaler, stiloto inhaler, eszopiclone, promethazine by his primary physician today. RN spoke with Colletta Maryland and [place].  Per their advice, patient should continue to hydrate (he will use pedialyte) and try the medications prescribed by primary to see how they work on his symptoms.  He should have the PICC removed Wednesday and start oral therapy as directed by Dr Tommy Medal on 3/6 (keflex and rifampin).  He should come in Thursday for CBC diff, CMP, and LFT labs at [facility].  RN confirmed with Jackelyn Poling at [facility].  Patient in agreement.  Lab orders entered. Landis Gandy, RN

## 2018-09-03 NOTE — Telephone Encounter (Signed)
Thanks If not improving, could hold rifampin til he has ID f/u.

## 2018-09-03 NOTE — ED Triage Notes (Signed)
Pt reports a cough for the past 4 days. Pt reports he went to his PCP for same and was given a shot of a steroid. Pt not sure what it was. Pt still not feeling better so he came in for evaluation.

## 2018-09-05 ENCOUNTER — Other Ambulatory Visit: Payer: Self-pay

## 2018-09-05 ENCOUNTER — Other Ambulatory Visit: Payer: PPO

## 2018-09-05 DIAGNOSIS — T8459XS Infection and inflammatory reaction due to other internal joint prosthesis, sequela: Secondary | ICD-10-CM | POA: Diagnosis not present

## 2018-09-05 DIAGNOSIS — Z96619 Presence of unspecified artificial shoulder joint: Secondary | ICD-10-CM | POA: Diagnosis not present

## 2018-09-05 NOTE — Telephone Encounter (Signed)
Patient here for labs today. He is not feeling better with new allergy pill for upper respiratory congestion or promethazine for nausea. He will speak with the pharmacist today about something behind the counter for nasal/sinus congestion, will start using humidifier at home for his upper respiratory symptoms.  He skipped this morning's rifampin, felt decreased nausea and increased appetite for the first time.  [place] will be removing his PICC at the home soon, he will start cephalexin 500 mg 4 times daily with rifampin tomorrow.  He would like to consider stopping rifampin if at all possible, but will wait for ID's advice on this. Please advise on rifampin.  His clinic follow up is in 2 weeks - 3/25. Landis Gandy, RN

## 2018-09-06 DIAGNOSIS — Z4789 Encounter for other orthopedic aftercare: Secondary | ICD-10-CM | POA: Diagnosis not present

## 2018-09-06 DIAGNOSIS — M25312 Other instability, left shoulder: Secondary | ICD-10-CM | POA: Diagnosis not present

## 2018-09-06 DIAGNOSIS — Z96612 Presence of left artificial shoulder joint: Secondary | ICD-10-CM | POA: Diagnosis not present

## 2018-09-06 LAB — HEPATIC FUNCTION PANEL
AG RATIO: 1.4 (calc) (ref 1.0–2.5)
ALT: 30 U/L (ref 9–46)
AST: 24 U/L (ref 10–35)
Albumin: 4 g/dL (ref 3.6–5.1)
Alkaline phosphatase (APISO): 114 U/L (ref 35–144)
Bilirubin, Direct: 0.1 mg/dL (ref 0.0–0.2)
Globulin: 2.8 g/dL (calc) (ref 1.9–3.7)
Indirect Bilirubin: 0.3 mg/dL (calc) (ref 0.2–1.2)
Total Bilirubin: 0.4 mg/dL (ref 0.2–1.2)
Total Protein: 6.8 g/dL (ref 6.1–8.1)

## 2018-09-06 LAB — CBC WITH DIFFERENTIAL/PLATELET
ABSOLUTE MONOCYTES: 898 {cells}/uL (ref 200–950)
Basophils Absolute: 42 cells/uL (ref 0–200)
Basophils Relative: 0.9 %
Eosinophils Absolute: 371 cells/uL (ref 15–500)
Eosinophils Relative: 7.9 %
HCT: 44.7 % (ref 38.5–50.0)
Hemoglobin: 15.6 g/dL (ref 13.2–17.1)
Lymphs Abs: 1189 cells/uL (ref 850–3900)
MCH: 31.7 pg (ref 27.0–33.0)
MCHC: 34.9 g/dL (ref 32.0–36.0)
MCV: 90.9 fL (ref 80.0–100.0)
MPV: 11.4 fL (ref 7.5–12.5)
Monocytes Relative: 19.1 %
NEUTROS PCT: 46.8 %
Neutro Abs: 2200 cells/uL (ref 1500–7800)
PLATELETS: 185 10*3/uL (ref 140–400)
RBC: 4.92 10*6/uL (ref 4.20–5.80)
RDW: 13.4 % (ref 11.0–15.0)
TOTAL LYMPHOCYTE: 25.3 %
WBC: 4.7 10*3/uL (ref 3.8–10.8)

## 2018-09-06 LAB — COMPREHENSIVE METABOLIC PANEL
AG Ratio: 1.4 (calc) (ref 1.0–2.5)
ALT: 30 U/L (ref 9–46)
AST: 24 U/L (ref 10–35)
Albumin: 4 g/dL (ref 3.6–5.1)
Alkaline phosphatase (APISO): 114 U/L (ref 35–144)
BUN/Creatinine Ratio: 11 (calc) (ref 6–22)
BUN: 15 mg/dL (ref 7–25)
CHLORIDE: 102 mmol/L (ref 98–110)
CO2: 30 mmol/L (ref 20–32)
Calcium: 9.4 mg/dL (ref 8.6–10.3)
Creat: 1.39 mg/dL — ABNORMAL HIGH (ref 0.70–1.25)
GLOBULIN: 2.8 g/dL (ref 1.9–3.7)
Glucose, Bld: 97 mg/dL (ref 65–99)
Potassium: 3.9 mmol/L (ref 3.5–5.3)
Sodium: 140 mmol/L (ref 135–146)
Total Bilirubin: 0.4 mg/dL (ref 0.2–1.2)
Total Protein: 6.8 g/dL (ref 6.1–8.1)

## 2018-09-06 NOTE — Telephone Encounter (Signed)
Per Dr Johnnye Sima, patient will hold rifampin until 3/16 and he can discuss further antibiotic options.  Patient agreeable to this option, states he feels so much better after holding rifampin for 24 hours already.

## 2018-09-08 DIAGNOSIS — G4700 Insomnia, unspecified: Secondary | ICD-10-CM | POA: Diagnosis not present

## 2018-09-08 DIAGNOSIS — J069 Acute upper respiratory infection, unspecified: Secondary | ICD-10-CM | POA: Diagnosis not present

## 2018-09-08 NOTE — Telephone Encounter (Signed)
He can stay off of it it is a trade off of less side effects for less aggressive medical therapy. This is not unusual

## 2018-09-09 NOTE — Telephone Encounter (Signed)
Relayed directions to patient. He has picked up keflex, will hold the rifampin now. Please advise if he needs to push back follow up further than 3/25.

## 2018-09-09 NOTE — Telephone Encounter (Signed)
Will send to front desk to reschedule 1-2 months. Landis Gandy, RN

## 2018-09-09 NOTE — Telephone Encounter (Signed)
Pushing back another 1-2 months is probably a very good idea

## 2018-09-18 ENCOUNTER — Ambulatory Visit: Payer: PPO | Admitting: Infectious Disease

## 2018-09-20 DIAGNOSIS — M25312 Other instability, left shoulder: Secondary | ICD-10-CM | POA: Diagnosis not present

## 2018-09-20 DIAGNOSIS — Z96612 Presence of left artificial shoulder joint: Secondary | ICD-10-CM | POA: Diagnosis not present

## 2018-10-07 ENCOUNTER — Other Ambulatory Visit: Payer: Self-pay | Admitting: *Deleted

## 2018-10-07 MED ORDER — CEPHALEXIN 500 MG PO CAPS: 500.0000 mg | ORAL_CAPSULE | Freq: Four times a day (QID) | ORAL | 11 refills | Status: DC

## 2018-10-16 ENCOUNTER — Other Ambulatory Visit: Payer: Self-pay

## 2018-10-16 ENCOUNTER — Ambulatory Visit (INDEPENDENT_AMBULATORY_CARE_PROVIDER_SITE_OTHER): Payer: PPO | Admitting: Infectious Disease

## 2018-10-16 ENCOUNTER — Encounter: Payer: Self-pay | Admitting: Infectious Disease

## 2018-10-16 VITALS — BP 142/95 | HR 66 | Temp 97.8°F | Wt 213.0 lb

## 2018-10-16 DIAGNOSIS — T8459XD Infection and inflammatory reaction due to other internal joint prosthesis, subsequent encounter: Secondary | ICD-10-CM

## 2018-10-16 DIAGNOSIS — R7989 Other specified abnormal findings of blood chemistry: Secondary | ICD-10-CM

## 2018-10-16 DIAGNOSIS — Z96619 Presence of unspecified artificial shoulder joint: Secondary | ICD-10-CM

## 2018-10-16 NOTE — Progress Notes (Signed)
Subjective:    Chief complaint: Follow-up for infected prosthetic shoulder    Patient ID: Geoffrey West, male    DOB: January 02, 1956, 63 y.o.   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 He was taken to OR by Dr. [name] on June 15th of 2018 and had shoulder reviisino and reversal of total shoulder arthroplasty. 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   I am for the first time in December 25, 2016 he actually was having no pain whatsoever.   We worked him into clinic urgently after phone call from Dr. [name] arrange for placement of a PICC line.  However he did not like the PICC line and we ended up changing him to oral antibiotics in the form of   We worked to place  IV ceftriaxone 2grams daily to cover the P acnes and the Coag Neg Staph plus rifampin 300 mg BID  Zyvox and rifampin but had trouble tolerating this regimen as well in particular the rifampin which we then discontinued and ultimately transitioned him to Augmentin. I planned him  to have 6 months of therapy and emphasized that we should see him again in January 2019 which would be the six-month mark of treatment of his prosthetic joint infection.  I saw him in January 2019 and his inflammatory markers were reassuring and he was not having pain.  Unfortunately after this he was admitted in the summertime after he had developed severe pain and was found on CT scan to have glenoid component screws that had loosened with a lucency between the inferior portion of the glenoid component and just glenoid bone with large amounts of fluid in the subacromial and subdeltoid bursa.  To the operating room on July 9 and underwent explantation of all components of his left reverse shoulder arthroplasty with  insertion of antibiotic spacer in the left shoulder with open reduction internal internal fixation of a left proximal humerus fracture.  [name] did mention his operative note that there was problems with the superior and anterior peripheral locking screws which are fractured.  He said they were not able to retrieve the tip of the screw as it was deep in the glenoid vault.  Cultures were sent and the patient then did grow Propionibacterium on all 4 specimens were sent.  Patient was treated with ceftriaxone as an inpatient also had gotten vancomycin.  He was ultimately discharged on oral Keflex 500 mg 4 times daily because he did not want have a PICC line again.  Unfortunately not gain control of infection with the surgery and antibiotics and he had to go back to the operating room yet again timbre underwent she underwent explantation of his humeral antibiotic spacer, bone grafting of the left glenoid fossa bone defect excisional debridement of bone muscle subcutaneous tissue and placement of a left antibiotic spacer.  Ulcers done in September on antibiotics failed to yield an organism.  He ultimately was taken back to the operating room in December still on oral Keflex per our recommendations and underwent plan of his humeral antibiotic spacer and reversal of his total shoulder revision.  He was doing well postoperatively and had hardly any pain and minimal need for any pain meds.  He states he only took a few doses of tramadol.  However  in the last week he is developed severe pain in his left shoulder.  He wondereed if it could have been due to the fact that he is now going through physical therapy and rehab and he also had a massage where his shoulder was down.  He stated he had pain there all the time but is worse when he moves the shoulder and he rated it as being dull deep but also sharp and can be up to 8 or 9 out of 10 in severity.  He  was trying to manage this with gabapentin and nonsteroidal along with  tramadol but he was not achieving adequate pain control.  We checked his inflammatory markers his sed rate had climbed to 46 and a CRP had gone to the 9.8 I was very concerned about this as well as Dr. [name].  He was ultimately taken to the operating room January 21 and underwent excisional and non-excisional debridement of infected left shoulder prosthesis including skin subcutaneous tissue muscle and fascia.  A reverse total shoulder revision was performed with polyethylene and glenosphere head ball of 2 components.  Was taken the operating room failed to grow any organisms anaerobically and are being still held for 2 weeks.  There is no growth at 7 days.  I doubt we will grow an organism given he was on antibiotics previously and the media is not designed to pick up nontuberculous mycobacteria nocardia or nocardia terribly well, should be microorganisms I would be thinking would take that long to grow potentially.  Her pain had improved since being in the hospital.  He was seen by my partner Dr. [name] who changed the patient from IV vancomycin to 2 g of ceftriaxone daily.  The patient as mentioned had previously been unwilling to try IV antibiotics at this time is been tolerating him.  His PICC line was  clean dry and intact.  As mentioned his pain is improved relative to when he was in the hospital he is proceeding with some physical therapy.  I told him that he would need to be on some oral antibiotics chronically after completing his antibiotics.  In the interval he had worsening of his shoulder pain again and in early February was having 8 out of 10 pain.  He was a postop day #23.  He also had increased swelling and erythema at the area and was seen by [name] NP.  Add rifampin and then saw him again on 19 February where he clinically had improved and his sed rate and CRP also came down in the interim.  He continued to feel better and says he feels like his pain is much much better his  range of motion is continuing to improve.  Some abdominal discomfort with rifampin but was managing to tolerate it he wears contacts but has not noticed staining of his contacts yet.    Pleated his IV antibiotics and was changed to Keflex and rifampin but did not tolerate the rifampin well anymore and is on Keflex alone at 500 mg 4 times daily.  He feels the shoulder pain is similar to when I saw him last and he is happy it is not progressing.      Past Medical History:  Diagnosis Date  . Anxiety   . Arthritis    "all over" (03/06/2018)  . GERD (gastroesophageal reflux disease)   . Gout    "on daily RX" (03/06/2018)  . Hypertension   . Hypertension 12/25/2016  . Low testosterone  12/25/2016  . Migraine    "haven't had more than 1/year since propanolol started" (03/06/2018)  . 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 Situational depression    "recently lost my mom and brother" (03/06/2018)    Past Surgical History:  Procedure Laterality Date  . BACK SURGERY    . COLONOSCOPY    . COLONOSCOPY W/ BIOPSIES AND POLYPECTOMY    . EXCISIONAL TOTAL SHOULDER ARTHROPLASTY WITH ANTIBIOTIC SPACER Left 03/06/2018   Procedure: Left shoulder antibiotic spacer expalnt with glenoid bone grafting, antiobiotic spacer placement;  Surgeon: [name], MD;  Location: [facility];  Service: Orthopedics;  Laterality: Left;  . IR FLUORO GUIDE CV LINE RIGHT  12/26/2016  . JOINT REPLACEMENT    . KNEE ARTHROSCOPY Bilateral    X 10 total arthroscopies  . KNEE CARTILAGE SURGERY Left 1977   "cartilage removed"  . LASIK Bilateral   . LUMBAR DISC SURGERY  2000s   "ruptured disc"  . REVERSE SHOULDER ARTHROPLASTY Left 12/08/2016   Procedure: Revision left total shoulder to reverse total shoulder arthroplasty;  Surgeon: [name], MD;  Location: [facility];  Service: Orthopedics;  Laterality: Left;  . REVERSE SHOULDER ARTHROPLASTY Left 01/01/2018   Procedure: Left reverse shouler explant with  antibiotic spacer placement;  Surgeon: [name], MD;  Location: [facility];  Service: Orthopedics;  Laterality: Left;  2.5 hrs  . REVERSE SHOULDER ARTHROPLASTY Left 06/11/2018   Procedure: REVERSE TOTAL SHOULDER REVISION;  Surgeon: [name], MD;  Location: [facility];  Service: Orthopedics;  Laterality: Left;  . REVISION TOTAL SHOULDER TO REVERSE TOTAL SHOULDER Left 07/16/2018   Procedure: Left shoulder irrigation and debridement with poly and head ball exchange;  Surgeon: [name], MD;  Location: [facility];  Service: Orthopedics;  Laterality: Left;  2 hrs  . SHOULDER ARTHROSCOPY Right 1980s/1990s   "bone spur removed"  . SHOULDER OPEN ROTATOR CUFF REPAIR Right 1970s  . SHOULDER SURGERY Left 03/06/2018   antibiotic spacer expalnt with glenoid bone grafting, antiobiotic spacer placement  . TOTAL KNEE ARTHROPLASTY Bilateral   . TOTAL SHOULDER ARTHROPLASTY Left ~ 2014-2017 X 2   "in [place]"  . TOTAL SHOULDER REPLACEMENT Right ~ 2009   "in [place]"  . WISDOM TOOTH EXTRACTION      Family History  Problem Relation Age of Onset  . Arthritis Mother   . Arthritis Sister   . Alzheimer's disease Brother   . Arthritis Brother       Social History   Socioeconomic History  . Marital status: Single    Spouse name: Not on file  . Number of children: Not on file  . Years of education: Not on file  . Highest education level: Not on file  Occupational History  . Not on file  Social Needs  . Financial resource strain: Not on file  . Food insecurity:    Worry: Not on file    Inability: Not on file  . Transportation needs:    Medical: Not on file    Non-medical: Not on file  Tobacco Use  . Smoking status: Never Smoker  . Smokeless tobacco: Never Used  Substance and Sexual Activity  . Alcohol use: Not Currently    Comment: 03/06/2018 "max of 2 drinks/year; if that"  . Drug use: Never  . Sexual activity: Not Currently  Lifestyle  . Physical activity:     Days per week: Not on file    Minutes per session: Not on file  . Stress:  Not on file  Relationships  . Social connections:    Talks on phone: Not on file    Gets together: Not on file    Attends religious service: Not on file    Active member of club or organization: Not on file    Attends meetings of clubs or organizations: Not on file    Relationship status: Not on file  Other Topics Concern  . Not on file  Social History Narrative  . Not on file    Allergies  Allergen Reactions  . Fentanyl Shortness Of Breath and Other (See Comments)    Reaction to [facility] ONLY > ? DOSE REGULATION ?   [name] Oxycodone Other (See Comments)    Pt states he goes through hot and cold flashes withdrawal like symptoms      Review of Systems  Constitutional: Negative for chills and fever.  HENT: Negative for congestion and sore throat.   Eyes: Negative for photophobia.  Respiratory: Negative for cough, shortness of breath and wheezing.   Cardiovascular: Negative for chest pain, palpitations and leg swelling.  Gastrointestinal: Negative for abdominal pain, blood in stool, constipation, diarrhea, nausea and vomiting.  Genitourinary: Negative for dysuria, flank pain and hematuria.  Musculoskeletal: Positive for arthralgias. Negative for back pain and myalgias.  Skin: Positive for wound. Negative for rash.  Neurological: Negative for dizziness, weakness and headaches.  Hematological: Does not bruise/bleed easily.  Psychiatric/Behavioral: Negative for agitation, behavioral problems, confusion, decreased concentration, dysphoric mood and suicidal ideas.       Objective:   Physical Exam  Constitutional: He is oriented to person, place, and time. He appears well-developed and well-nourished. No distress.  HENT:  Head: Normocephalic and atraumatic.  Mouth/Throat: No oropharyngeal exudate.  Eyes: Conjunctivae and EOM are normal. No scleral icterus.  Neck: Normal range of motion. Neck supple.   Cardiovascular: Normal rate and regular rhythm.  Pulmonary/Chest: Effort normal. No respiratory distress. He has no wheezes.  Abdominal: He exhibits no distension.  Musculoskeletal:        General: No tenderness or edema.  Neurological: He is alert and oriented to person, place, and time. He exhibits normal muscle tone. Coordination normal.  Skin: Skin is warm and dry. No rash noted. He is not diaphoretic. No erythema. No pallor.  Psychiatric: He has a normal mood and affect. His behavior is normal. Judgment and thought content normal.       Assessment & Plan:   Prosthetic shoulder infecction:   Asked him to stay on the Keflex for a "very long time" likely indefinitely.  He can if he likes take the Keflex at 2 tablets twice daily if this is convenient and if he does not have worsening of his shoulder pain.  We will schedule up an ED visit in August.

## 2018-10-16 NOTE — Patient Instructions (Signed)
EVISIT IN [place]

## 2018-10-17 LAB — COMPLETE METABOLIC PANEL WITH GFR
AG Ratio: 1.7 (calc) (ref 1.0–2.5)
ALT: 20 U/L (ref 9–46)
AST: 20 U/L (ref 10–35)
Albumin: 4.3 g/dL (ref 3.6–5.1)
Alkaline phosphatase (APISO): 92 U/L (ref 35–144)
BUN: 17 mg/dL (ref 7–25)
CO2: 28 mmol/L (ref 20–32)
Calcium: 10 mg/dL (ref 8.6–10.3)
Chloride: 102 mmol/L (ref 98–110)
Creat: 1.22 mg/dL (ref 0.70–1.25)
GFR, Est African American: 73 mL/min/{1.73_m2} (ref >=60)
GFR, Est Non African American: 63 mL/min/{1.73_m2} (ref >=60)
Globulin: 2.6 g/dL (calc) (ref 1.9–3.7)
Glucose, Bld: 99 mg/dL (ref 65–99)
Potassium: 4 mmol/L (ref 3.5–5.3)
Sodium: 140 mmol/L (ref 135–146)
Total Bilirubin: 0.6 mg/dL (ref 0.2–1.2)
Total Protein: 6.9 g/dL (ref 6.1–8.1)

## 2018-10-17 LAB — CBC WITH DIFFERENTIAL/PLATELET
Absolute Monocytes: 533 cells/uL (ref 200–950)
Basophils Absolute: 42 cells/uL (ref 0–200)
Basophils Relative: 1 %
Eosinophils Absolute: 273 cells/uL (ref 15–500)
Eosinophils Relative: 6.5 %
HCT: 48.1 % (ref 38.5–50.0)
Hemoglobin: 16.6 g/dL (ref 13.2–17.1)
Lymphs Abs: 1109 cells/uL (ref 850–3900)
MCH: 30.5 pg (ref 27.0–33.0)
MCHC: 34.5 g/dL (ref 32.0–36.0)
MCV: 88.3 fL (ref 80.0–100.0)
MPV: 12.2 fL (ref 7.5–12.5)
Monocytes Relative: 12.7 %
Neutro Abs: 2243 cells/uL (ref 1500–7800)
Neutrophils Relative %: 53.4 %
Platelets: 198 10*3/uL (ref 140–400)
RBC: 5.45 10*6/uL (ref 4.20–5.80)
RDW: 14.2 % (ref 11.0–15.0)
Total Lymphocyte: 26.4 %
WBC: 4.2 10*3/uL (ref 3.8–10.8)

## 2018-10-17 LAB — SEDIMENTATION RATE: Sed Rate: 6 mm/h (ref 0–20)

## 2018-10-17 LAB — C-REACTIVE PROTEIN: CRP: 1.1 mg/L (ref <8.0)

## 2018-11-11 DIAGNOSIS — E291 Testicular hypofunction: Secondary | ICD-10-CM | POA: Diagnosis not present

## 2018-11-13 DIAGNOSIS — R5383 Other fatigue: Secondary | ICD-10-CM | POA: Diagnosis not present

## 2018-11-13 DIAGNOSIS — Z471 Aftercare following joint replacement surgery: Secondary | ICD-10-CM | POA: Diagnosis not present

## 2018-11-13 DIAGNOSIS — Z96612 Presence of left artificial shoulder joint: Secondary | ICD-10-CM | POA: Diagnosis not present

## 2018-11-13 DIAGNOSIS — E291 Testicular hypofunction: Secondary | ICD-10-CM | POA: Diagnosis not present

## 2018-11-13 DIAGNOSIS — D751 Secondary polycythemia: Secondary | ICD-10-CM | POA: Diagnosis not present

## 2018-11-13 DIAGNOSIS — M2550 Pain in unspecified joint: Secondary | ICD-10-CM | POA: Diagnosis not present

## 2018-12-03 ENCOUNTER — Other Ambulatory Visit: Payer: Self-pay

## 2018-12-03 ENCOUNTER — Other Ambulatory Visit (HOSPITAL_COMMUNITY)
Admission: RE | Admit: 2018-12-03 | Discharge: 2018-12-03 | Disposition: A | Discharge status: Home / Self Care | Payer: PPO | Source: Ambulatory Visit | Attending: Orthopedic Surgery | Admitting: Orthopedic Surgery

## 2018-12-03 ENCOUNTER — Encounter (HOSPITAL_COMMUNITY)
Admission: RE | Admit: 2018-12-03 | Discharge: 2018-12-03 | Disposition: A | Discharge status: Home / Self Care | Payer: PPO | Source: Ambulatory Visit | Attending: Orthopedic Surgery | Admitting: Orthopedic Surgery

## 2018-12-03 ENCOUNTER — Encounter (HOSPITAL_COMMUNITY): Payer: Self-pay

## 2018-12-03 DIAGNOSIS — Z96612 Presence of left artificial shoulder joint: Secondary | ICD-10-CM | POA: Diagnosis present

## 2018-12-03 DIAGNOSIS — T8459XA Infection and inflammatory reaction due to other internal joint prosthesis, initial encounter: Secondary | ICD-10-CM | POA: Diagnosis present

## 2018-12-03 DIAGNOSIS — Z1159 Encounter for screening for other viral diseases: Secondary | ICD-10-CM | POA: Diagnosis not present

## 2018-12-03 DIAGNOSIS — Z96653 Presence of artificial knee joint, bilateral: Secondary | ICD-10-CM | POA: Diagnosis present

## 2018-12-03 DIAGNOSIS — Z885 Allergy status to narcotic agent status: Secondary | ICD-10-CM | POA: Diagnosis not present

## 2018-12-03 DIAGNOSIS — Z8261 Family history of arthritis: Secondary | ICD-10-CM | POA: Diagnosis not present

## 2018-12-03 DIAGNOSIS — Z87442 Personal history of urinary calculi: Secondary | ICD-10-CM | POA: Diagnosis not present

## 2018-12-03 DIAGNOSIS — Z79899 Other long term (current) drug therapy: Secondary | ICD-10-CM | POA: Diagnosis not present

## 2018-12-03 DIAGNOSIS — Y831 Surgical operation with implant of artificial internal device as the cause of abnormal reaction of the patient, or of later complication, without mention of misadventure at the time of the procedure: Secondary | ICD-10-CM | POA: Diagnosis present

## 2018-12-03 DIAGNOSIS — Z96611 Presence of right artificial shoulder joint: Secondary | ICD-10-CM | POA: Diagnosis present

## 2018-12-03 DIAGNOSIS — G8918 Other acute postprocedural pain: Secondary | ICD-10-CM | POA: Diagnosis not present

## 2018-12-03 DIAGNOSIS — I10 Essential (primary) hypertension: Secondary | ICD-10-CM | POA: Diagnosis present

## 2018-12-03 DIAGNOSIS — F419 Anxiety disorder, unspecified: Secondary | ICD-10-CM | POA: Diagnosis present

## 2018-12-03 DIAGNOSIS — M109 Gout, unspecified: Secondary | ICD-10-CM | POA: Diagnosis present

## 2018-12-03 DIAGNOSIS — A419 Sepsis, unspecified organism: Secondary | ICD-10-CM | POA: Diagnosis present

## 2018-12-03 DIAGNOSIS — K219 Gastro-esophageal reflux disease without esophagitis: Secondary | ICD-10-CM | POA: Diagnosis present

## 2018-12-03 DIAGNOSIS — M1990 Unspecified osteoarthritis, unspecified site: Secondary | ICD-10-CM | POA: Diagnosis present

## 2018-12-03 DIAGNOSIS — Z820 Family history of epilepsy and other diseases of the nervous system: Secondary | ICD-10-CM | POA: Diagnosis not present

## 2018-12-03 DIAGNOSIS — T84038A Mechanical loosening of other internal prosthetic joint, initial encounter: Secondary | ICD-10-CM | POA: Diagnosis present

## 2018-12-03 DIAGNOSIS — Z471 Aftercare following joint replacement surgery: Secondary | ICD-10-CM | POA: Diagnosis not present

## 2018-12-03 HISTORY — DX: Personal history of urinary calculi: Z87.442

## 2018-12-03 LAB — BASIC METABOLIC PANEL
Anion gap: 10 (ref 5–15)
BUN: 16 mg/dL (ref 8–23)
CO2: 26 mmol/L (ref 22–32)
Calcium: 9.7 mg/dL (ref 8.9–10.3)
Chloride: 102 mmol/L (ref 98–111)
Creatinine, Ser: 1.11 mg/dL (ref 0.61–1.24)
GFR calc Af Amer: >60 mL/min (ref >60)
GFR calc non Af Amer: >60 mL/min (ref >60)
Glucose, Bld: 109 mg/dL — ABNORMAL HIGH (ref 70–99)
Potassium: 3.6 mmol/L (ref 3.5–5.1)
Sodium: 138 mmol/L (ref 135–145)

## 2018-12-03 LAB — CBC
HCT: 46.3 % (ref 39.0–52.0)
Hemoglobin: 15.3 g/dL (ref 13.0–17.0)
MCH: 30.2 pg (ref 26.0–34.0)
MCHC: 33 g/dL (ref 30.0–36.0)
MCV: 91.5 fL (ref 80.0–100.0)
Platelets: 190 10*3/uL (ref 150–400)
RBC: 5.06 MIL/uL (ref 4.22–5.81)
RDW: 13.9 % (ref 11.5–15.5)
WBC: 5.2 10*3/uL (ref 4.0–10.5)
nRBC: 0 % (ref 0.0–0.2)

## 2018-12-03 LAB — SURGICAL PCR SCREEN
MRSA, PCR: NEGATIVE
Staphylococcus aureus: NEGATIVE

## 2018-12-03 NOTE — Progress Notes (Addendum)
PCP - Dr. Doyle Askew  Cardiologist - no Chest x-ray - no  EKG - 12/31/2017  Stress Test - no  ECHO - 2011  Cardiac Cath - no  Sleep Study - no CPAP - no  LABS-CBC, BMP, PCR  ASA-no ERAS-yes, I instructed patient to drink Pre Surgery Ensure between 4:00 AMand 4:30 AM  HA1C- Fasting Blonood Sugar - 0 Checks Blood Sugar ___0__ times a day  Anesthesia-  Pt denies having chest pain, sob, or fever at this time. All instructions explained to the pt, with a verbal understanding of the material. Pt agrees to go over the instructions while at home for a better understanding. The opportunity to ask questions was provided.  Mr Kunzler denies that he nor his family has experienced any of the following: Cough Fever >100.4 Runny Nose  I instruc Sore Throat Difficulty breathing/ shortness of breath Travel in past 14 days- no Mr Dillenburg will have COVID test done today, he then plans to go home and quarantine until OR.

## 2018-12-03 NOTE — Pre-Procedure Instructions (Signed)
[facility]  12/03/2018    Your procedure is scheduled on Thursday, June 11.  Report to [facility], Main Entrance or Entrance "A" at 5:30 AM                    Your surgery or procedure is scheduled for 7:30  A.M.   Call this number if you have problems the morning of surgery:[phone]  This is the number for the Pre- Surgical Desk.    Remember:  Do not eat after midnight Wednesday, June 10.  You may drink clear liquids until 4:30 AM   Clear liquids allowed are:   Water, Juice (non-citric and without pulp), Carbonated beverages, Clear Tea, Black Coffee only, Plain Jell-O only, Gatorade and Plain Popsicles only    Take these medicines the morning of surgery with A SIP OF WATER :  allopurinol (ZYLOPRIM)             pantoprazole (PROTONIX)               PARoxetine (PAXIL)  If needed:  clonazePAM (KLONOPIN) fluticasone (FLONASE) rizatriptan (MAXALT)  STOP taking Aspirin, Aspirin Products (Goody Powder, Excedrin Migraine), Ibuprofen (Advil), Naproxen (Aleve),celecoxib (CELEBREX) , Vitamins and Herbal Products (ie Fish Oil.Coenzyme Q10 (COQ10l).  Special instructions:  Advance- Preparing For Surgery  Before surgery, you can play an important role. Because skin is not sterile, your skin needs to be as free of germs as possible. You can reduce the number of germs on your skin by washing with CHG (chlorahexidine gluconate) Soap before surgery.  CHG is an antiseptic cleaner which kills germs and bonds with the skin to continue killing germs even after washing.    Oral Hygiene is also important to reduce your risk of infection.  Remember - BRUSH YOUR TEETH THE MORNING OF SURGERY WITH YOUR REGULAR TOOTHPASTE  Please do not use if you have an allergy to CHG or antibacterial soaps. If your skin becomes reddened/irritated stop using the CHG.  Do not shave (including legs and underarms) for at least 48 hours prior to first CHG shower. It is OK to shave your face.  Please  follow these instructions carefully.   1. Shower the NIGHT BEFORE SURGERY and the MORNING OF SURGERY with CHG.  If you chose to wash your hair, wash your hair first as usual with your normal shampoo, then           wash your face and private area with the soap you use at home, then rinse your hair and body thoroughly to remove the shampoo and soap.   2. Use CHG as you would any other liquid soap. You can apply CHG directly to the skin and wash gently with a scrungie or a clean washcloth.   3. Apply the CHG Soap to your body ONLY FROM THE NECK DOWN.  Do not use on open wounds or open sores. Avoid contact with your eyes, ears, mouth and genitals (private parts)  4. Wash thoroughly, paying special attention to the area where your surgery will be performed.  5. Thoroughly rinse your body with warm water from the neck down.  6. DO NOT shower/wash with your normal soap after using and rinsing off the CHG Soap.  7. Pat yourself dry with a CLEAN TOWEL.  8. Wear CLEAN PAJAMAS to bed the night before surgery, wear comfortable clothes the morning of surgery  9. Place CLEAN SHEETS on your bed the night of your first  shower and DO NOT SLEEP WITH PETS.  Day of Surgery: Shower as Instructed above   Do not wear lotions, powders, or perfumes, or deodorant. Please wear clean clothes to the hospital/surgery center.   Remember to brush your teeth WITH YOUR REGULAR TOOTHPASTE.  Do not wear jewelry, make-up or nail polish.  Do not shave 48 hours prior to surgery.  Men may shave face and neck.  Do not bring valuables to the hospital.  [facility] is not responsible for any belongings or valuables.  Contacts, dentures or bridgework may not be worn into surgery.  Leave your suitcase in the car.  After surgery it may be brought to your room.  For patients admitted to the hospital, discharge time will be determined by your treatment team.  Patients discharged the day of surgery will not be allowed to drive  home.   Please read over the following fact sheets that you were given: Pain Booklet, Incentive Spirometry, Surgical Site Infections, 10 Things YOU CAN DO TO [place]

## 2018-12-04 LAB — NOVEL CORONAVIRUS, NAA (HOSP ORDER, SEND-OUT TO REF LAB; TAT 18-24 HRS): SARS-CoV-2, NAA: NOT DETECTED

## 2018-12-04 NOTE — Anesthesia Preprocedure Evaluation (Addendum)
Anesthesia Evaluation  Patient identified by MRN, date of birth, ID band Patient awake    Reviewed: Allergy & Precautions, H&P , NPO status , Patient's Chart, lab work & pertinent test results  Airway Mallampati: II  TM Distance: >3 FB Neck ROM: Full    Dental no notable dental hx. (+) Teeth Intact, Dental Advisory Given   Pulmonary neg pulmonary ROS,    Pulmonary exam normal breath sounds clear to auscultation       Cardiovascular Exercise Tolerance: Good hypertension, Pt. on medications  Rhythm:Regular Rate:Normal     Neuro/Psych  Headaches, Anxiety Depression    GI/Hepatic Neg liver ROS, GERD  Medicated and Controlled,  Endo/Other  negative endocrine ROS  Renal/GU negative Renal ROS  negative genitourinary   Musculoskeletal  (+) Arthritis ,   Abdominal   Peds  Hematology negative hematology ROS (+)   Anesthesia Other Findings   Reproductive/Obstetrics negative OB ROS                            Anesthesia Physical Anesthesia Plan  ASA: II  Anesthesia Plan: General   Post-op Pain Management:  [facility] for Post-op pain   Induction: Intravenous  PONV Risk Score and Plan: 2 and Ondansetron, Dexamethasone and Midazolam  Airway Management Planned: Oral ETT  Additional Equipment:   Intra-op Plan:   Post-operative Plan: Extubation in OR  Informed Consent: I have reviewed the patients History and Physical, chart, labs and discussed the procedure including the risks, benefits and alternatives for the proposed anesthesia with the patient or authorized representative who has indicated his/her understanding and acceptance.     Dental advisory given  Plan Discussed with: CRNA  Anesthesia Plan Comments:         Anesthesia Quick Evaluation

## 2018-12-05 ENCOUNTER — Inpatient Hospital Stay (HOSPITAL_COMMUNITY): Payer: PPO

## 2018-12-05 ENCOUNTER — Inpatient Hospital Stay (HOSPITAL_COMMUNITY)
Admission: RE | Admit: 2018-12-05 | Discharge: 2018-12-06 | DRG: 483 | Disposition: A | Discharge status: Home / Self Care | Payer: PPO | Attending: Orthopedic Surgery | Admitting: Orthopedic Surgery

## 2018-12-05 ENCOUNTER — Encounter (HOSPITAL_COMMUNITY): Payer: Self-pay | Admitting: General Practice

## 2018-12-05 ENCOUNTER — Encounter (HOSPITAL_COMMUNITY)
Admission: RE | Disposition: A | Discharge status: Home / Self Care | Payer: Self-pay | Source: Home / Self Care | Attending: Orthopedic Surgery

## 2018-12-05 ENCOUNTER — Other Ambulatory Visit: Payer: Self-pay

## 2018-12-05 ENCOUNTER — Inpatient Hospital Stay (HOSPITAL_COMMUNITY): Payer: PPO | Admitting: Anesthesiology

## 2018-12-05 DIAGNOSIS — Z9889 Other specified postprocedural states: Secondary | ICD-10-CM | POA: Diagnosis present

## 2018-12-05 DIAGNOSIS — I10 Essential (primary) hypertension: Secondary | ICD-10-CM | POA: Diagnosis present

## 2018-12-05 DIAGNOSIS — M109 Gout, unspecified: Secondary | ICD-10-CM | POA: Diagnosis present

## 2018-12-05 DIAGNOSIS — Z79899 Other long term (current) drug therapy: Secondary | ICD-10-CM

## 2018-12-05 DIAGNOSIS — Z885 Allergy status to narcotic agent status: Secondary | ICD-10-CM | POA: Diagnosis not present

## 2018-12-05 DIAGNOSIS — T84038A Mechanical loosening of other internal prosthetic joint, initial encounter: Secondary | ICD-10-CM | POA: Diagnosis present

## 2018-12-05 DIAGNOSIS — F419 Anxiety disorder, unspecified: Secondary | ICD-10-CM | POA: Diagnosis present

## 2018-12-05 DIAGNOSIS — Z96653 Presence of artificial knee joint, bilateral: Secondary | ICD-10-CM | POA: Diagnosis present

## 2018-12-05 DIAGNOSIS — Z8261 Family history of arthritis: Secondary | ICD-10-CM | POA: Diagnosis not present

## 2018-12-05 DIAGNOSIS — Z9660 Presence of unspecified orthopedic joint implant: Secondary | ICD-10-CM | POA: Diagnosis present

## 2018-12-05 DIAGNOSIS — Z87442 Personal history of urinary calculi: Secondary | ICD-10-CM | POA: Diagnosis not present

## 2018-12-05 DIAGNOSIS — Z96612 Presence of left artificial shoulder joint: Secondary | ICD-10-CM | POA: Diagnosis present

## 2018-12-05 DIAGNOSIS — Z96611 Presence of right artificial shoulder joint: Secondary | ICD-10-CM | POA: Diagnosis present

## 2018-12-05 DIAGNOSIS — M1990 Unspecified osteoarthritis, unspecified site: Secondary | ICD-10-CM | POA: Diagnosis present

## 2018-12-05 DIAGNOSIS — T8459XA Infection and inflammatory reaction due to other internal joint prosthesis, initial encounter: Secondary | ICD-10-CM | POA: Diagnosis present

## 2018-12-05 DIAGNOSIS — Y831 Surgical operation with implant of artificial internal device as the cause of abnormal reaction of the patient, or of later complication, without mention of misadventure at the time of the procedure: Secondary | ICD-10-CM | POA: Diagnosis present

## 2018-12-05 DIAGNOSIS — Z820 Family history of epilepsy and other diseases of the nervous system: Secondary | ICD-10-CM

## 2018-12-05 DIAGNOSIS — Z1159 Encounter for screening for other viral diseases: Secondary | ICD-10-CM

## 2018-12-05 DIAGNOSIS — A419 Sepsis, unspecified organism: Secondary | ICD-10-CM | POA: Diagnosis present

## 2018-12-05 DIAGNOSIS — K219 Gastro-esophageal reflux disease without esophagitis: Secondary | ICD-10-CM | POA: Diagnosis present

## 2018-12-05 HISTORY — PX: REVERSE SHOULDER ARTHROPLASTY: SHX5054

## 2018-12-05 SURGERY — ARTHROPLASTY, SHOULDER, TOTAL, REVERSE
Anesthesia: General | Laterality: Left

## 2018-12-05 MED ORDER — VANCOMYCIN HCL 1000 MG IV SOLR
INTRAVENOUS | Status: DC | PRN
  Administered 2018-12-05: 1000 mg via TOPICAL

## 2018-12-05 MED ORDER — SUCCINYLCHOLINE CHLORIDE 200 MG/10ML IV SOSY
PREFILLED_SYRINGE | INTRAVENOUS | Status: AC
  Filled 2018-12-05: qty 10

## 2018-12-05 MED ORDER — EPHEDRINE 5 MG/ML INJ
INTRAVENOUS | Status: AC
  Filled 2018-12-05: qty 10

## 2018-12-05 MED ORDER — ACETAMINOPHEN 325 MG PO TABS: 325.0000 mg | ORAL_TABLET | Freq: Four times a day (QID) | ORAL | Status: DC | PRN

## 2018-12-05 MED ORDER — 0.9 % SODIUM CHLORIDE (POUR BTL) OPTIME
TOPICAL | Status: DC | PRN
  Administered 2018-12-05: 1000 mL

## 2018-12-05 MED ORDER — MIDAZOLAM HCL 5 MG/5ML IJ SOLN
INTRAMUSCULAR | Status: DC | PRN
  Administered 2018-12-05: 2 mg via INTRAVENOUS

## 2018-12-05 MED ORDER — DEXAMETHASONE SODIUM PHOSPHATE 10 MG/ML IJ SOLN
INTRAMUSCULAR | Status: AC
  Filled 2018-12-05: qty 1

## 2018-12-05 MED ORDER — HYDROCODONE-ACETAMINOPHEN 7.5-325 MG PO TABS: 1.0000 | ORAL_TABLET | ORAL | Status: DC | PRN

## 2018-12-05 MED ORDER — DOCUSATE SODIUM 100 MG PO CAPS
100.0000 mg | ORAL_CAPSULE | Freq: Two times a day (BID) | ORAL | Status: DC
  Administered 2018-12-05: 100 mg via ORAL
  Filled 2018-12-05: qty 1

## 2018-12-05 MED ORDER — BUPIVACAINE-EPINEPHRINE 0.25% -1:200000 IJ SOLN: INTRAMUSCULAR | Status: DC | PRN

## 2018-12-05 MED ORDER — GABAPENTIN 300 MG PO CAPS
300.0000 mg | ORAL_CAPSULE | Freq: Three times a day (TID) | ORAL | Status: DC
  Administered 2018-12-05 – 2018-12-06 (×3): 300 mg via ORAL
  Filled 2018-12-05 (×3): qty 1

## 2018-12-05 MED ORDER — ACETAMINOPHEN 500 MG PO TABS
1000.0000 mg | ORAL_TABLET | Freq: Once | ORAL | Status: AC
  Administered 2018-12-05: 1000 mg via ORAL

## 2018-12-05 MED ORDER — METOCLOPRAMIDE HCL 5 MG PO TABS: 5.0000 mg | ORAL_TABLET | Freq: Three times a day (TID) | ORAL | Status: DC | PRN

## 2018-12-05 MED ORDER — CLONAZEPAM 1 MG PO TABS: 1.0000 mg | ORAL_TABLET | Freq: Every day | ORAL | Status: DC | PRN

## 2018-12-05 MED ORDER — SODIUM CHLORIDE 0.9 % IV SOLN
INTRAVENOUS | Status: DC | PRN
  Administered 2018-12-05 (×2): 40 ug/min via INTRAVENOUS

## 2018-12-05 MED ORDER — PROPOFOL 10 MG/ML IV BOLUS
INTRAVENOUS | Status: DC | PRN
  Administered 2018-12-05: 200 mg via INTRAVENOUS

## 2018-12-05 MED ORDER — PANTOPRAZOLE SODIUM 40 MG PO TBEC: 40.0000 mg | DELAYED_RELEASE_TABLET | Freq: Every day | ORAL | Status: DC

## 2018-12-05 MED ORDER — MENTHOL 3 MG MT LOZG: 1.0000 | LOZENGE | OROMUCOSAL | Status: DC | PRN

## 2018-12-05 MED ORDER — CELECOXIB 200 MG PO CAPS: 200.0000 mg | ORAL_CAPSULE | Freq: Two times a day (BID) | ORAL | Status: DC

## 2018-12-05 MED ORDER — FENTANYL CITRATE (PF) 100 MCG/2ML IJ SOLN
INTRAMUSCULAR | Status: DC | PRN
  Administered 2018-12-05 (×2): 50 ug via INTRAVENOUS

## 2018-12-05 MED ORDER — TRAMADOL HCL 50 MG PO TABS: 50.0000 mg | ORAL_TABLET | Freq: Four times a day (QID) | ORAL | Status: DC | PRN

## 2018-12-05 MED ORDER — SUCCINYLCHOLINE CHLORIDE 20 MG/ML IJ SOLN
INTRAMUSCULAR | Status: DC | PRN
  Administered 2018-12-05: 100 mg via INTRAVENOUS

## 2018-12-05 MED ORDER — PROPRANOLOL HCL ER 80 MG PO CP24
80.0000 mg | ORAL_CAPSULE | Freq: Every evening | ORAL | Status: DC
  Administered 2018-12-05: 80 mg via ORAL
  Filled 2018-12-05: qty 1

## 2018-12-05 MED ORDER — ONDANSETRON HCL 4 MG/2ML IJ SOLN
INTRAMUSCULAR | Status: AC
  Filled 2018-12-05: qty 2

## 2018-12-05 MED ORDER — COQ10 200 MG PO CAPS: 200.0000 mg | ORAL_CAPSULE | Freq: Every day | ORAL | Status: DC

## 2018-12-05 MED ORDER — VANCOMYCIN HCL 1000 MG IV SOLR
INTRAVENOUS | Status: AC
  Filled 2018-12-05: qty 1000

## 2018-12-05 MED ORDER — CHLORHEXIDINE GLUCONATE 4 % EX LIQD: 60.0000 mL | Freq: Once | CUTANEOUS | Status: DC

## 2018-12-05 MED ORDER — METOCLOPRAMIDE HCL 5 MG/ML IJ SOLN: 5.0000 mg | Freq: Three times a day (TID) | INTRAMUSCULAR | Status: DC | PRN

## 2018-12-05 MED ORDER — TRANEXAMIC ACID-NACL 1000-0.7 MG/100ML-% IV SOLN
1000.0000 mg | INTRAVENOUS | Status: AC
  Administered 2018-12-05: 1000 mg via INTRAVENOUS
  Filled 2018-12-05: qty 100

## 2018-12-05 MED ORDER — TRANEXAMIC ACID-NACL 1000-0.7 MG/100ML-% IV SOLN
INTRAVENOUS | Status: AC
  Filled 2018-12-05: qty 100

## 2018-12-05 MED ORDER — ACETAMINOPHEN 500 MG PO TABS
500.0000 mg | ORAL_TABLET | Freq: Four times a day (QID) | ORAL | Status: AC
  Administered 2018-12-05 – 2018-12-06 (×4): 500 mg via ORAL
  Filled 2018-12-05 (×4): qty 1

## 2018-12-05 MED ORDER — PAROXETINE HCL 20 MG PO TABS: 20.0000 mg | ORAL_TABLET | Freq: Every day | ORAL | Status: DC

## 2018-12-05 MED ORDER — AMLODIPINE BESYLATE 10 MG PO TABS
10.0000 mg | ORAL_TABLET | Freq: Every day | ORAL | Status: DC
  Administered 2018-12-05: 10 mg via ORAL
  Filled 2018-12-05: qty 1

## 2018-12-05 MED ORDER — ONDANSETRON HCL 4 MG/2ML IJ SOLN: 4.0000 mg | Freq: Four times a day (QID) | INTRAMUSCULAR | Status: DC | PRN

## 2018-12-05 MED ORDER — PHENYLEPHRINE 40 MCG/ML (10ML) SYRINGE FOR IV PUSH (FOR BLOOD PRESSURE SUPPORT)
PREFILLED_SYRINGE | INTRAVENOUS | Status: AC
  Filled 2018-12-05: qty 10

## 2018-12-05 MED ORDER — PROPOFOL 10 MG/ML IV BOLUS
INTRAVENOUS | Status: AC
  Filled 2018-12-05: qty 20

## 2018-12-05 MED ORDER — HYDROMORPHONE HCL 1 MG/ML IJ SOLN
0.2500 mg | INTRAMUSCULAR | Status: DC | PRN
  Administered 2018-12-05: 0.25 mg via INTRAVENOUS

## 2018-12-05 MED ORDER — BUPIVACAINE LIPOSOME 1.3 % IJ SUSP
INTRAMUSCULAR | Status: DC | PRN
  Administered 2018-12-05: 10 mL via PERINEURAL

## 2018-12-05 MED ORDER — ALLOPURINOL 300 MG PO TABS: 300.0000 mg | ORAL_TABLET | Freq: Every day | ORAL | Status: DC

## 2018-12-05 MED ORDER — ACETAMINOPHEN 500 MG PO TABS
ORAL_TABLET | ORAL | Status: AC
  Administered 2018-12-05: 1000 mg via ORAL
  Filled 2018-12-05: qty 2

## 2018-12-05 MED ORDER — SUMATRIPTAN SUCCINATE 50 MG PO TABS
50.0000 mg | ORAL_TABLET | ORAL | Status: DC | PRN
  Filled 2018-12-05: qty 1

## 2018-12-05 MED ORDER — DEXAMETHASONE SODIUM PHOSPHATE 10 MG/ML IJ SOLN
INTRAMUSCULAR | Status: DC | PRN
  Administered 2018-12-05: 5 mg via INTRAVENOUS

## 2018-12-05 MED ORDER — PHENYLEPHRINE HCL (PRESSORS) 10 MG/ML IV SOLN
INTRAVENOUS | Status: DC | PRN
  Administered 2018-12-05: 120 ug via INTRAVENOUS

## 2018-12-05 MED ORDER — EPHEDRINE SULFATE 50 MG/ML IJ SOLN
INTRAMUSCULAR | Status: DC | PRN
  Administered 2018-12-05: 10 mg via INTRAVENOUS
  Administered 2018-12-05: 5 mg via INTRAVENOUS
  Administered 2018-12-05: 10 mg via INTRAVENOUS

## 2018-12-05 MED ORDER — LIDOCAINE 2% (20 MG/ML) 5 ML SYRINGE
INTRAMUSCULAR | Status: AC
  Filled 2018-12-05: qty 5

## 2018-12-05 MED ORDER — KETOROLAC TROMETHAMINE 15 MG/ML IJ SOLN
7.5000 mg | Freq: Four times a day (QID) | INTRAMUSCULAR | Status: AC
  Administered 2018-12-05 – 2018-12-06 (×4): 7.5 mg via INTRAVENOUS
  Filled 2018-12-05 (×4): qty 1

## 2018-12-05 MED ORDER — BUPIVACAINE-EPINEPHRINE (PF) 0.5% -1:200000 IJ SOLN
INTRAMUSCULAR | Status: DC | PRN
  Administered 2018-12-05: 15 mL via PERINEURAL

## 2018-12-05 MED ORDER — CEFAZOLIN SODIUM-DEXTROSE 2-4 GM/100ML-% IV SOLN
2.0000 g | INTRAVENOUS | Status: AC
  Administered 2018-12-05: 2 g via INTRAVENOUS
  Filled 2018-12-05: qty 100

## 2018-12-05 MED ORDER — LACTATED RINGERS IV SOLN
INTRAVENOUS | Status: DC | PRN
  Administered 2018-12-05: 07:00:00 via INTRAVENOUS

## 2018-12-05 MED ORDER — CEPHALEXIN 500 MG PO CAPS
1000.0000 mg | ORAL_CAPSULE | Freq: Two times a day (BID) | ORAL | Status: DC
  Administered 2018-12-05: 1000 mg via ORAL
  Filled 2018-12-05: qty 2

## 2018-12-05 MED ORDER — HYDROCODONE-ACETAMINOPHEN 5-325 MG PO TABS: 1.0000 | ORAL_TABLET | ORAL | Status: DC | PRN

## 2018-12-05 MED ORDER — MORPHINE SULFATE (PF) 2 MG/ML IV SOLN: 0.5000 mg | INTRAVENOUS | Status: DC | PRN

## 2018-12-05 MED ORDER — ONDANSETRON HCL 4 MG/2ML IJ SOLN
INTRAMUSCULAR | Status: DC | PRN
  Administered 2018-12-05: 4 mg via INTRAVENOUS

## 2018-12-05 MED ORDER — PHENOL 1.4 % MT LIQD: 1.0000 | OROMUCOSAL | Status: DC | PRN

## 2018-12-05 MED ORDER — HYDROMORPHONE HCL 1 MG/ML IJ SOLN
INTRAMUSCULAR | Status: AC
  Filled 2018-12-05: qty 1

## 2018-12-05 MED ORDER — FENTANYL CITRATE (PF) 250 MCG/5ML IJ SOLN
INTRAMUSCULAR | Status: AC
  Filled 2018-12-05: qty 5

## 2018-12-05 MED ORDER — MIDAZOLAM HCL 2 MG/2ML IJ SOLN
INTRAMUSCULAR | Status: AC
  Filled 2018-12-05: qty 2

## 2018-12-05 MED ORDER — BUPIVACAINE-EPINEPHRINE (PF) 0.25% -1:200000 IJ SOLN
INTRAMUSCULAR | Status: AC
  Filled 2018-12-05: qty 30

## 2018-12-05 MED ORDER — FLUTICASONE PROPIONATE 50 MCG/ACT NA SUSP: 2.0000 | Freq: Every day | NASAL | Status: DC | PRN

## 2018-12-05 MED ORDER — ONDANSETRON HCL 4 MG PO TABS: 4.0000 mg | ORAL_TABLET | Freq: Four times a day (QID) | ORAL | Status: DC | PRN

## 2018-12-05 SURGICAL SUPPLY — 63 items
ALCOHOL 70% 16 OZ (MISCELLANEOUS) ×2 IMPLANT
BIT DRILL 5/64X5 DISP (BIT) ×2 IMPLANT
BLADE SAG 18X100X1.27 (BLADE) ×2 IMPLANT
CLSR STERI-STRIP ANTIMIC 1/2X4 (GAUZE/BANDAGES/DRESSINGS) ×2 IMPLANT
COVER SURGICAL LIGHT HANDLE (MISCELLANEOUS) ×2 IMPLANT
COVER WAND RF STERILE (DRAPES) IMPLANT
DRAPE IMP U-DRAPE 54X76 (DRAPES) ×4 IMPLANT
DRAPE INCISE IOBAN 66X45 STRL (DRAPES) ×2 IMPLANT
DRAPE ORTHO SPLIT 77X108 STRL (DRAPES) ×2
DRAPE SURG 17X23 STRL (DRAPES) ×2 IMPLANT
DRAPE SURG ORHT 6 SPLT 77X108 (DRAPES) ×2 IMPLANT
DRAPE U-SHAPE 47X51 STRL (DRAPES) ×2 IMPLANT
DRSG AQUACEL AG ADV 3.5X10 (GAUZE/BANDAGES/DRESSINGS) ×2 IMPLANT
DRSG XEROFORM 1X8 (GAUZE/BANDAGES/DRESSINGS) ×2 IMPLANT
DURAPREP 26ML APPLICATOR (WOUND CARE) ×2 IMPLANT
ELECT BLADE 4.0 EZ CLEAN MEGAD (MISCELLANEOUS) ×2
ELECT REM PT RETURN 9FT ADLT (ELECTROSURGICAL) ×2
ELECTRODE BLDE 4.0 EZ CLN MEGD (MISCELLANEOUS) ×1 IMPLANT
ELECTRODE REM PT RTRN 9FT ADLT (ELECTROSURGICAL) ×1 IMPLANT
FIBER STAGRAFT 5CC (Tissue) ×2 IMPLANT
GLOVE BIO SURGEON STRL SZ7.5 (GLOVE) ×2 IMPLANT
GLOVE BIOGEL PI IND STRL 8 (GLOVE) ×1 IMPLANT
GLOVE BIOGEL PI INDICATOR 8 (GLOVE) ×1
GOWN STRL REUS W/ TWL LRG LVL3 (GOWN DISPOSABLE) ×1 IMPLANT
GOWN STRL REUS W/ TWL XL LVL3 (GOWN DISPOSABLE) ×1 IMPLANT
GOWN STRL REUS W/TWL LRG LVL3 (GOWN DISPOSABLE) ×1
GOWN STRL REUS W/TWL XL LVL3 (GOWN DISPOSABLE) ×1
HUMERAL HEAD STRL 54MMX24MM (Orthopedic Implant) ×2 IMPLANT
JET LAVAGE IRRISEPT WOUND (IRRIGATION / IRRIGATOR) ×2
KIT BASIN OR (CUSTOM PROCEDURE TRAY) ×2 IMPLANT
KIT TURNOVER KIT B (KITS) ×2 IMPLANT
LAVAGE JET IRRISEPT WOUND (IRRIGATION / IRRIGATOR) ×1 IMPLANT
MANIFOLD NEPTUNE II (INSTRUMENTS) ×2 IMPLANT
NEEDLE 1/2 CIR MAYO (NEEDLE) ×2 IMPLANT
NEEDLE HYPO 25GX1X1/2 BEV (NEEDLE) ×2 IMPLANT
NS IRRIG 1000ML POUR BTL (IV SOLUTION) ×2 IMPLANT
PACK SHOULDER (CUSTOM PROCEDURE TRAY) ×2 IMPLANT
PAD ARMBOARD 7.5X6 YLW CONV (MISCELLANEOUS) ×4 IMPLANT
RESTRAINT HEAD UNIVERSAL NS (MISCELLANEOUS) ×2 IMPLANT
SLING ARM IMMOBILIZER LRG (SOFTGOODS) ×2 IMPLANT
SLING ARM IMMOBILIZER MED (SOFTGOODS) IMPLANT
SPONGE LAP 18X18 RF (DISPOSABLE) IMPLANT
SPONGE LAP 4X18 RFD (DISPOSABLE) ×2 IMPLANT
STRIP CLOSURE SKIN 1/2X4 (GAUZE/BANDAGES/DRESSINGS) ×2 IMPLANT
SUCTION FRAZIER HANDLE 10FR (MISCELLANEOUS) ×1
SUCTION TUBE FRAZIER 10FR DISP (MISCELLANEOUS) ×1 IMPLANT
SUT ETHILON 3 0 PS 1 (SUTURE) ×2 IMPLANT
SUT FIBERWIRE #2 38 T-5 BLUE (SUTURE) ×2
SUT MNCRL AB 4-0 PS2 18 (SUTURE) ×2 IMPLANT
SUT MON AB 2-0 CT1 36 (SUTURE) ×2 IMPLANT
SUT PDS AB 1 CT  36 (SUTURE) ×1
SUT PDS AB 1 CT 36 (SUTURE) ×1 IMPLANT
SUT VIC AB 1 CT1 27 (SUTURE)
SUT VIC AB 1 CT1 27XBRD ANBCTR (SUTURE) IMPLANT
SUT VIC AB 2-0 CT1 27 (SUTURE) ×1
SUT VIC AB 2-0 CT1 TAPERPNT 27 (SUTURE) ×1 IMPLANT
SUTURE FIBERWR #2 38 T-5 BLUE (SUTURE) ×1 IMPLANT
SYR CONTROL 10ML LL (SYRINGE) ×2 IMPLANT
TOWEL OR 17X24 6PK STRL BLUE (TOWEL DISPOSABLE) ×2 IMPLANT
TOWEL OR 17X26 10 PK STRL BLUE (TOWEL DISPOSABLE) ×2 IMPLANT
TOWER CARTRIDGE SMART MIX (DISPOSABLE) IMPLANT
WATER STERILE IRR 1000ML POUR (IV SOLUTION) IMPLANT
YANKAUER SUCT BULB TIP NO VENT (SUCTIONS) ×2 IMPLANT

## 2018-12-05 NOTE — Discharge Instructions (Signed)
Sling to the left arm for comfort only.  He may discontinue this when you feel comfortable.  Maintain postoperative bandage until your follow-up appointment.  If this become saturated or soiled you may remove and replace with a daily dry dressing.  No lifting with the left arm.  You may perform activities of daily living with that arm.  Return to see Dr. [name] in 2 weeks for routine postop care.   No outpatient physical therapy at this time.  We will organize that on an as-needed basis.

## 2018-12-05 NOTE — Op Note (Signed)
Date of Surgery: 12/05/2018  INDICATIONS: Mr. Sikora is a 63 y.o.-year-old male with a left reverse shoulder arthroplasty glenoid sided loosening.  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  He was most recently implanted last year with a augmented reverse baseplate.  At his follow-up appointment 2 weeks ago he was noted to have glenosphere loosening.  He is here today for explant and bone grafting of the glenoid with conversion to a CTA head.;  The patient did consent to the procedure after discussion of the risks and benefits.  PREOPERATIVE DIAGNOSIS:  1.  Left reverse shoulder glenoid sided loosening. 2.  Chronically infected left reverse shoulder arthroplasty    POSTOPERATIVE DIAGNOSIS: Same.  PROCEDURE:  1.  Left reverse shoulder revision of one component.  Explantation of glenosphere and placement of CTA head 2.  Excisional debridement of left shoulder to include pseudocapsule, bone, muscle, and skin.  SURGEON: Geralynn Rile, M.D.  ASSIST: April Green, RNFA.  ANESTHESIA:  general, and regional  IV FLUIDS AND URINE: See anesthesia.  ESTIMATED BLOOD LOSS: 100 mL.  IMPLANTS:  5 cc of biomet stagraft cancellus bone graft Biomet CTA head 54/24  DRAINS: none  COMPLICATIONS: None.  DESCRIPTION OF PROCEDURE: The patient was brought to the operating room and placed supine on the operating table.  The patient had been signed prior to the procedure and this was documented. The patient had the anesthesia placed by the anesthesiologist.  He was then placed in standard beachchair position with a well-padded head holding device.  A time-out was performed to confirm that this was the correct patient, site, side and location. The patient did receive antibiotics prior to the incision and was re-dosed during the procedure as needed at indicated intervals.  A tourniquet was not placed.  The patient had  the operative extremity prepped and draped in the standard surgical fashion.      We began the procedure by using his previously established deltopectoral incision.  Dissection was carried down atraumatically to the level of the joint capsule.  The pseudocapsule was entered and deep retractors were placed.  We did encounter a rush of synovial appearing fluid.  There was no gross purulence.  This was sent for culture.  We next continued down to the level of the glenosphere.  This demonstrated gross loosening and moved independent of the glenoid.  We were able to disassemble the humeral tray first without issue.  We then disimpacted the glenosphere from the humeral tray.  Next the glenoid baseplate was removed with 1 fractured screws noted superiorly.  This was well fixed in the bone and we did not separately remove this for fear of creating more bone loss and in the absence of obvious signs of infection.   Next we performed an excisional debridement of the glenoid.  This was accomplished with Bovie as well as knife.  We also used rondure to remove any pseudocapsule membrane and nonviable bone as well as muscle and the adjacent area.  We also used reamer to remove pseudocapsule from the glenoid face.  We then copiously irrigated the wound with normal saline.   We next moved to the humeral side where there was some pseudocapsule noted.  This was sharply excised as well.  We then copiously irrigated this.  We did note that the humeral stem was well fixed and did not show any signs of micro-instability or gross loosening.  At this juncture we began trialing the CTA heads.  We found the 54 x 24 mm head to be most adequate to fill the shoulder vault.   We then prepared the glenoid for bone graft.  We placed 5 cc of Biomet stay graft cancellus bone graft into the central defect as well as peripheral defects.  It was noted that there was a large posterior defect and step-off from the anterior glenoid lip back to  the face of the glenoid.  The bone graft did fill this void nicely and appeared to be maintaining its position there.   Next week cleaned and dried the humerus one final time for final implant.  We placed the 54 x 24 mm CTA head that had nice fit and fill the vault.  There was some's laxity appreciated at the shoulder but the shoulder did not grossly escape the vault.   We then irrigated the wound one final time and placed 1 g of vancomycin powder in the deep space.  The deltopectoral interval was closed with #1 PDS interrupted.  The subcutaneous tissues were closed with interrupted 2-0 Monocryl.  Skin was closed with 3 oh horizontal mattress nylon.  Skin was cleaned and dried one final time and a sterile bandage was placed.  Patient was awoken from general anesthesia without complications.  There were no known intraoperative complications.  All counts were correct x2.  He was transported to PACU in stable condition.  POSTOPERATIVE PLAN:  Patient will be nonweightbearing to the left upper extremity for approximately 4 weeks.  He can begin active motion as tolerated at the arm.  He can discontinue his sling when he feels comfortable.  Otherwise it can be worn for comfort.  He will be admitted for routine postoperative care and pain control.

## 2018-12-05 NOTE — Brief Op Note (Signed)
12/05/2018  8:54 AM  PATIENT:  Geoffrey West  63 y.o. male  PRE-OPERATIVE DIAGNOSIS:  Left reverse shoulder loosening  POST-OPERATIVE DIAGNOSIS:  Left reverse shoulder loosening  PROCEDURE:  Procedure(s) with comments: Left reverse shoulder explant, bone grafting (Left) - 2.5 hrs  SURGEON:  Surgeon(s) and Role:    * [name], MD - Primary  PHYSICIAN ASSISTANT:   ASSISTANTS: April Green, RNFA   ANESTHESIA:   regional and general  EBL:  100 mL   BLOOD ADMINISTERED:none  DRAINS: none   LOCAL MEDICATIONS USED:  NONE  SPECIMEN:  No Specimen  DISPOSITION OF SPECIMEN:  N/A  COUNTS:  YES  TOURNIQUET:  * No tourniquets in log *  DICTATION: .Note written in EPIC  PLAN OF CARE: Admit to inpatient   PATIENT DISPOSITION:  PACU - hemodynamically stable.   Delay start of Pharmacological VTE agent (>24hrs) due to surgical blood loss or risk of bleeding: not applicable

## 2018-12-05 NOTE — Anesthesia Procedure Notes (Signed)
Anesthesia Regional Block: Interscalene brachial plexus block   Pre-Anesthetic Checklist: ,, timeout performed, Correct Patient, Correct Site, Correct Laterality, Correct Procedure, Correct Position, site marked, Risks and benefits discussed, pre-op evaluation,  At surgeon's request and post-op pain management  Laterality: Left  Prep: Maximum Sterile Barrier Precautions used, chloraprep       Needles:  Injection technique: Single-shot  Needle Type: Echogenic Stimulator Needle     Needle Length: 5cm  Needle Gauge: 22     Additional Needles:   Procedures:,,,, ultrasound used (permanent image in chart),,,,  Narrative:  Start time: 12/05/2018 7:00 AM End time: 12/05/2018 7:10 AM Injection made incrementally with aspirations every 5 mL. Anesthesiologist: Roderic Palau, MD  Additional Notes: 2% Lidocaine skin wheel.

## 2018-12-05 NOTE — Transfer of Care (Signed)
Immediate Anesthesia Transfer of Care Note  Patient: Geoffrey West [facility]  Procedure(s) Performed: Left reverse shoulder explant, bone grafting (Left )  Patient Location: PACU  Anesthesia Type:GA combined with regional for post-op pain  Level of Consciousness: drowsy  Airway & Oxygen Therapy: Patient Spontanous Breathing and Patient connected to nasal cannula oxygen  Post-op Assessment: Report given to RN and Post -op Vital signs reviewed and stable  Post vital signs: Reviewed and stable  Last Vitals:  Vitals Value Taken Time  BP 121/89 12/05/18 0905  Temp    Pulse 72 12/05/18 0908  Resp 12 12/05/18 0908  SpO2 97 % 12/05/18 0908  Vitals shown include unvalidated device data.  Last Pain:  Vitals:   12/05/18 0642  TempSrc:   PainSc: 0-No pain      Patients Stated Pain Goal: 3 (17/79/39 0300)  Complications: No apparent anesthesia complications

## 2018-12-05 NOTE — Anesthesia Postprocedure Evaluation (Signed)
Anesthesia Post Note  Patient: Xaidyn Kepner [facility]  Procedure(s) Performed: Left reverse shoulder explant, bone grafting (Left )     Patient location during evaluation: PACU Anesthesia Type: General and Regional Level of consciousness: awake and alert Pain management: pain level controlled Vital Signs Assessment: post-procedure vital signs reviewed and stable Respiratory status: spontaneous breathing, nonlabored ventilation and respiratory function stable Cardiovascular status: blood pressure returned to baseline and stable Postop Assessment: no apparent nausea or vomiting Anesthetic complications: no    Last Vitals:  Vitals:   12/05/18 1130 12/05/18 1200  BP: (!) 116/92 (!) 125/91  Pulse: 70 69  Resp: 20 13  Temp:    SpO2: 93% 93%    Last Pain:  Vitals:   12/05/18 1204  TempSrc:   PainSc: 5                  Hortencia Martire,W. EDMOND

## 2018-12-05 NOTE — Anesthesia Procedure Notes (Signed)
Procedure Name: Intubation Date/Time: 12/05/2018 7:36 AM Performed by: Jedrek Dinovo T, CRNA Pre-anesthesia Checklist: Patient identified, Emergency Drugs available, Suction available and Patient being monitored Patient Re-evaluated:Patient Re-evaluated prior to induction Oxygen Delivery Method: Circle system utilized Preoxygenation: Pre-oxygenation with 100% oxygen Induction Type: IV induction and Rapid sequence Laryngoscope Size: Miller and 3 Grade View: Grade I Tube type: Oral Tube size: 7.5 mm Number of attempts: 1 Airway Equipment and Method: Patient positioned with wedge pillow and Stylet Placement Confirmation: ETT inserted through vocal cords under direct vision,  positive ETCO2 and breath sounds checked- equal and bilateral Secured at: 23 cm Tube secured with: Tape Dental Injury: Teeth and Oropharynx as per pre-operative assessment

## 2018-12-05 NOTE — Addendum Note (Signed)
Addendum  created 12/05/18 1826 by Ollen Bowl, CRNA   Intraprocedure Event edited

## 2018-12-05 NOTE — H&P (Signed)
ORTHOPAEDIC H and P  REQUESTING PHYSICIAN: [name], MD  PCP:  Orpah Melter, MD  Chief Complaint: Left reverse arthroplasty loosening  HPI: Geoffrey West is a 63 y.o. male who complains of increasing left shoulder pain over the last couple of months.  On his routine follow-up in the office was apparent that the glenosphere had become loose.  There was concern for progressive bone loss in the face of that.  He presents today for selective explantation and bone grafting of the glenoid.  He has no new complaints today in fact feels minimal pain today.  He denies any recent cough or fevers or night sweats.  Past Medical History:  Diagnosis Date  . Anxiety   . Arthritis    "all over" (03/06/2018)  . GERD (gastroesophageal reflux disease)   . Gout    "on daily RX" (03/06/2018)  . History of kidney stones     passed x 1  . Hypertension   . Hypertension 12/25/2016  . Low testosterone 12/25/2016  . Migraine    "haven't had more than 1/year since propanolol started" (03/06/2018)  . 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 Situational depression    "recently lost my mom and brother" (03/06/2018)   Past Surgical History:  Procedure Laterality Date  . BACK SURGERY    . COLONOSCOPY    . COLONOSCOPY W/ BIOPSIES AND POLYPECTOMY    . EXCISIONAL TOTAL SHOULDER ARTHROPLASTY WITH ANTIBIOTIC SPACER Left 03/06/2018   Procedure: Left shoulder antibiotic spacer expalnt with glenoid bone grafting, antiobiotic spacer placement;  Surgeon: [name], MD;  Location: [place];  Service: Orthopedics;  Laterality: Left;  . IR FLUORO GUIDE CV LINE RIGHT  12/26/2016  . KNEE ARTHROSCOPY Bilateral    X 10 total arthroscopies  . KNEE CARTILAGE SURGERY Left 1977   "cartilage removed"  . LASIK Bilateral   . LUMBAR DISC SURGERY  2000s   "ruptured disc"  . REVERSE SHOULDER ARTHROPLASTY Left 12/08/2016   Procedure: Revision left total shoulder to reverse total shoulder arthroplasty;   Surgeon: [name], MD;  Location: [place];  Service: Orthopedics;  Laterality: Left;  . REVERSE SHOULDER ARTHROPLASTY Left 01/01/2018   Procedure: Left reverse shouler explant with antibiotic spacer placement;  Surgeon: [name], MD;  Location: [facility];  Service: Orthopedics;  Laterality: Left;  2.5 hrs  . REVERSE SHOULDER ARTHROPLASTY Left 06/11/2018   Procedure: REVERSE TOTAL SHOULDER REVISION;  Surgeon: [name], MD;  Location: [place];  Service: Orthopedics;  Laterality: Left;  . REVISION TOTAL SHOULDER TO REVERSE TOTAL SHOULDER Left 07/16/2018   Procedure: Left shoulder irrigation and debridement with poly and head ball exchange;  Surgeon: [name], MD;  Location: [place];  Service: Orthopedics;  Laterality: Left;  2 hrs  . SHOULDER ARTHROSCOPY Right 1980s/1990s   "bone spur removed"  . SHOULDER OPEN ROTATOR CUFF REPAIR Right 1970s  . SHOULDER SURGERY Left 03/06/2018   antibiotic spacer expalnt with glenoid bone grafting, antiobiotic spacer placement  . TOTAL KNEE ARTHROPLASTY Bilateral   . TOTAL SHOULDER ARTHROPLASTY Left ~ 2014-2017 X 2   "in [place]"  . TOTAL SHOULDER REPLACEMENT Right ~ 2009   "in [place]"  . WISDOM TOOTH EXTRACTION     Social History   Socioeconomic History  . Marital status: Single    Spouse name: Not on file  . Number of children: Not on file  . Years of education: Not on file  . Highest education level:  Not on file  Occupational History  . Not on file  Social Needs  . Financial resource strain: Not on file  . Food insecurity    Worry: Not on file    Inability: Not on file  . Transportation needs    Medical: Not on file    Non-medical: Not on file  Tobacco Use  . Smoking status: Never Smoker  . Smokeless tobacco: Never Used  Substance and Sexual Activity  . Alcohol use: Not Currently    Comment: 03/06/2018 "max of 2 drinks/year; if that"  . Drug use: Never  . Sexual activity: Not Currently   Lifestyle  . Physical activity    Days per week: Not on file    Minutes per session: Not on file  . Stress: Not on file  Relationships  . Social Herbalist on phone: Not on file    Gets together: Not on file    Attends religious service: Not on file    Active member of club or organization: Not on file    Attends meetings of clubs or organizations: Not on file    Relationship status: Not on file  Other Topics Concern  . Not on file  Social History Narrative  . Not on file   Family History  Problem Relation Age of Onset  . Arthritis Mother   . Arthritis Sister   . Alzheimer's disease Brother   . Arthritis Brother    Allergies  Allergen Reactions  . Fentanyl Shortness Of Breath and Other (See Comments)    Reaction to [facility] ONLY > ? DOSE REGULATION ?   [name] Oxycodone Other (See Comments)    Pt states he goes through hot and cold flashes withdrawal like symptoms   Prior to Admission medications   Medication Sig Start Date End Date Taking? Authorizing Provider  allopurinol (ZYLOPRIM) 300 MG tablet Take 300 mg by mouth daily.   Yes [provider]  amLODipine (NORVASC) 10 MG tablet Take 10 mg by mouth at bedtime.    Yes [provider]  anastrozole (ARIMIDEX) 1 MG tablet Take 0.5 mg by mouth every Wednesday.  08/10/18  Yes [provider]  celecoxib (CELEBREX) 200 MG capsule Take 200 mg by mouth 2 (two) times daily. 11/06/16  Yes [provider]  cephALEXin (KEFLEX) 500 MG capsule Take 1 capsule (500 mg total) by mouth 4 (four) times daily. Patient taking differently: Take 1,000 mg by mouth 2 (two) times daily.  10/07/18  Yes Tommy Medal, Lavell Islam, MD  Coenzyme Q10 (COQ10) 200 MG CAPS Take 200 mg by mouth daily.    Yes [provider]  fluticasone (FLONASE) 50 MCG/ACT nasal spray Place 2 sprays into both nostrils daily as needed for allergies or rhinitis.   Yes [provider]  Multiple Vitamin (MULTIVITAMIN WITH MINERALS)  TABS tablet Take 1 tablet by mouth daily.   Yes [provider]  pantoprazole (PROTONIX) 40 MG tablet Take 40 mg by mouth every morning.    Yes [provider]  PARoxetine (PAXIL) 20 MG tablet Take 20 mg by mouth every morning.   Yes [provider]  propranolol ER (INDERAL LA) 80 MG 24 hr capsule Take 80 mg by mouth every evening.  02/01/18  Yes [provider]  testosterone cypionate (DEPOTESTOSTERONE CYPIONATE) 200 MG/ML injection Inject 100 mg into the muscle every Wednesday.    Yes [provider]  clonazePAM (KLONOPIN) 1 MG tablet Take 1 mg by mouth daily  as needed for anxiety.    [provider]  rizatriptan (MAXALT) 10 MG tablet Take 10 mg by mouth as needed for migraine.  08/23/18   [provider]   No results found.  Positive ROS: All other systems have been reviewed and were otherwise negative with the exception of those mentioned in the HPI and as above.  Physical Exam: General: Alert, no acute distress Cardiovascular: No pedal edema Respiratory: No cyanosis, no use of accessory musculature GI: No organomegaly, abdomen is soft and non-tender Skin: No lesions in the area of chief complaint Neurologic: Sensation intact distally Psychiatric: Patient is competent for consent with normal mood and affect Lymphatic: No axillary or cervical lymphadenopathy  MUSCULOSKELETAL:  Left shoulder:  Nicely healed deltopectoral incision with no signs of erythema, drainage or dehiscence.  Assessment: Left reverse arthroplasty with loosening secondary to chronic infection  Plan: -Plan for today is for explantation of the glenosphere.  We discussed possible bone grafting of the glenoid vault and placement of a large articulating head on the humeral shaft to contain the shoulder.  -We again discussed the indications for this procedure and reviewed the risk and benefits.  We discussed the risk of bleeding, infection, periprosthetic  fracture, dislocation, damage to surrounding neurovascular structures, and the need for further surgery.  He has provided informed consent. -We will admit to the floor postoperatively.    [name], MD Cell [phone]    12/05/2018 7:17 AM

## 2018-12-05 NOTE — Plan of Care (Signed)

## 2018-12-06 NOTE — Plan of Care (Signed)

## 2018-12-06 NOTE — Evaluation (Signed)
Occupational Therapy Evaluation Patient Details Name: Geoffrey West MRN: [id number] DOB: 04/22/1956 Today's Date: 12/06/2018    History of Present Illness s/p L reverse shoulder arthroplasty and debridement   Clinical Impression   Pt with hx of multiple shoulder surgeries and infection. Presents with mild L shoulder pain. He is modified independent in self care. Educated in A/PROM L shoulder within MD parameters, AROM elbow to hand, NWB status, sling use and positioning L UE in bed and chair for comfort. Pt verbalizing and/or demonstrating understanding of all information. Ready to discharge home.    Follow Up Recommendations  Follow surgeon's recommendation for DC plan and follow-up therapies    Equipment Recommendations  None recommended by OT    Recommendations for Other Services       Precautions / Restrictions Precautions Precautions: Shoulder Type of Shoulder Precautions: P/AROM FF to 140, ABD to 60, ER to 30 Shoulder Interventions: For comfort(and sleep) Precaution Booklet Issued: Yes (comment) Required Braces or Orthoses: Sling Restrictions Weight Bearing Restrictions: Yes LUE Weight Bearing: Non weight bearing      Mobility Bed Mobility               General bed mobility comments: seated at EOB, recommended getting up to R side to avoid weight on L UE  Transfers Overall transfer level: Independent                    Balance                                           ADL either performed or assessed with clinical judgement   ADL Overall ADL's : Modified independent                                             Vision Patient Visual Report: No change from baseline       Perception     Praxis      Pertinent Vitals/Pain Pain Assessment: Faces Faces Pain Scale: Hurts a little bit Pain Location: L shoulder Pain Descriptors / Indicators: Sore Pain Intervention(s): Monitored during session     Hand  Dominance Right   Extremity/Trunk Assessment Upper Extremity Assessment Upper Extremity Assessment: LUE deficits/detail LUE Deficits / Details: performed AAROM L shoulder within MD parameters, AROM elbow to hand x 10 LUE Coordination: decreased gross motor   Lower Extremity Assessment Lower Extremity Assessment: Overall WFL for tasks assessed   Cervical / Trunk Assessment Cervical / Trunk Assessment: Normal   Communication Communication Communication: No difficulties   Cognition Arousal/Alertness: Awake/alert Behavior During Therapy: WFL for tasks assessed/performed Overall Cognitive Status: Within Functional Limits for tasks assessed                                     General Comments       Exercises     Shoulder Instructions      Home Living Family/patient expects to be discharged to:: Private residence Living Arrangements: Alone Available Help at Discharge: Family;Available PRN/intermittently Type of Home: House Home Access: Stairs to enter     Home Layout: One level     Bathroom Shower/Tub: Occupational psychologist: Standard  Home Equipment: None          Prior Functioning/Environment Level of Independence: Independent        Comments: Retired Careers information officer        OT Problem List:        OT Treatment/Interventions:      OT Goals(Current goals can be found in the care plan section) Acute Rehab OT Goals Patient Stated Goal: regain use of R shoulder  OT Frequency:     Barriers to D/C:            Co-evaluation              AM-PAC OT "6 Clicks" Daily Activity     Outcome Measure Help from another person eating meals?: None Help from another person taking care of personal grooming?: None Help from another person toileting, which includes using toliet, bedpan, or urinal?: None Help from another person bathing (including washing, rinsing, drying)?: None Help from another person to put on and taking off regular  upper body clothing?: None Help from another person to put on and taking off regular lower body clothing?: None 6 Click Score: 24   End of Session Nurse Communication: Other (comment)(ready for d/c)  Activity Tolerance: Patient tolerated treatment well Patient left: in bed;with nursing/sitter in room  OT Visit Diagnosis: Pain                Time: 0750-0820 OT Time Calculation (min): 30 min Charges:  OT General Charges $OT Visit: 1 Visit OT Evaluation $OT Eval Low Complexity: 1 Low OT Treatments $Therapeutic Exercise: 8-22 mins  Nestor Lewandowsky, OTR/L Acute Rehabilitation Services Pager: [phone] Office: [phone]  Malka So 12/06/2018, 8:29 AM

## 2018-12-06 NOTE — Progress Notes (Signed)
   Subjective:  Patient reports pain as mild.  No complaints.  Block has worn off.  Denies SOB/CP  Objective:   VITALS:   Vitals:   12/05/18 1951 12/05/18 2333 12/06/18 0346 12/06/18 0719  BP: 126/78 107/71 111/76 129/85  Pulse: 83 70 75 72  Resp: 17 18 16 16   Temp: 98.2 F (36.8 C) 98 F (36.7 C) (!) 97.4 F (36.3 C) (!) 97.4 F (36.3 C)  TempSrc: Oral Oral Oral Oral  SpO2: 97% 94% 95% 100%  Weight:      Height:        Neurologically intact Neurovascular intact Sensation intact distally Intact pulses distally Incision: dressing C/D/I Compartment soft   Lab Results  Component Value Date   WBC 5.2 12/03/2018   HGB 15.3 12/03/2018   HCT 46.3 12/03/2018   MCV 91.5 12/03/2018   PLT 190 12/03/2018   BMET    Component Value Date/Time   NA 138 12/03/2018 1343   K 3.6 12/03/2018 1343   CL 102 12/03/2018 1343   CO2 26 12/03/2018 1343   GLUCOSE 109 (H) 12/03/2018 1343   BUN 16 12/03/2018 1343   CREATININE 1.11 12/03/2018 1343   CREATININE 1.22 10/16/2018 1004   CALCIUM 9.7 12/03/2018 1343   GFRNONAA >60 12/03/2018 1343   GFRNONAA 63 10/16/2018 1004   GFRAA >60 12/03/2018 1343   GFRAA 73 10/16/2018 1004     Assessment/Plan: 1 Day Post-Op   Active Problems:   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

## 2018-12-10 LAB — AEROBIC/ANAEROBIC CULTURE W GRAM STAIN (SURGICAL/DEEP WOUND)
Culture: NO GROWTH
Gram Stain: NONE SEEN

## 2018-12-11 ENCOUNTER — Encounter (HOSPITAL_COMMUNITY): Payer: Self-pay | Admitting: Orthopedic Surgery

## 2018-12-11 NOTE — Discharge Summary (Signed)
Patient ID: Geoffrey West MRN: [id number] DOB/AGE: [birth year] 62 y.o.  Admit date: 12/05/2018 Discharge date: 12/06/2018  Primary Diagnosis: septic loosening left reverse shoulder  Admission Diagnoses:  Past Medical History:  Diagnosis Date  . Anxiety   . Arthritis    "all over" (03/06/2018)  . GERD (gastroesophageal reflux disease)   . Gout    "on daily RX" (03/06/2018)  . History of kidney stones     passed x 1  . Hypertension   . Hypertension 12/25/2016  . Low testosterone 12/25/2016  . Migraine    "haven't had more than 1/year since propanolol started" (03/06/2018)  . 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   Weight as of this encounter: 98.4 kg.  Procedure:  Procedure(s) (LRB): Left reverse shoulder explant, bone grafting (Left)   Consults: None  HPI: admitted s/p left reverse explant and bone grafting for loosening of glenosphere. Laboratory Data: Admission on 12/05/2018, Discharged on 12/06/2018  Component Date Value Ref Range Status  . Specimen Description 12/05/2018 WOUND   Final  . Special Requests 12/05/2018 JOINT   Final  . Gram Stain 12/05/2018    Final                   Value:NO WBC SEEN NO ORGANISMS SEEN   . Culture 12/05/2018    Final                   Value:No growth aerobically or anaerobically. Performed at [facility], [place] [address] [place], [place] [address]   . Report Status 12/05/2018 12/10/2018 FINAL   Final  Hospital Outpatient Visit on 12/03/2018  Component Date Value Ref Range Status  . SARS-CoV-2, NAA 12/03/2018 NOT DETECTED  NOT DETECTED Final   Comment:  (NOTE) This test was developed and its performance characteristics determined by [employer]. This test has not been FDA cleared or approved. This test has been authorized by FDA under an Emergency Use Authorization (EUA). This test is only authorized for the duration of time the declaration that circumstances exist justifying the authorization of the emergency use of in vitro diagnostic tests for detection of SARS-CoV-2 virus and/or diagnosis of COVID-19 infection under section 564(b)(1) of the Act, 21 U.S.C. 335KTG-2(B)(6), unless the authorization is terminated or revoked sooner. When diagnostic testing is negative, the possibility of a false negative result should be considered in the context of a patient's recent exposures and the presence of clinical signs and symptoms consistent with COVID-19. An individual without symptoms of COVID-19 and who is not shedding SARS-CoV-2 virus would expect to have a negative (not detected) result in this assay. Performed                           At: [facility] [place], [place] [id number] Rush Farmer MD JG:[id number]   . Coronavirus Source 12/03/2018 NASOPHARYNGEAL   Final   Performed at [facility], [place] [address] [place], [place] [address]  Hospital Outpatient Visit on 12/03/2018  Component Date Value Ref Range Status  .  MRSA, PCR 12/03/2018 NEGATIVE  NEGATIVE Final  . Staphylococcus aureus 12/03/2018 NEGATIVE  NEGATIVE Final   Comment: (NOTE) The Xpert SA Assay (FDA approved for NASAL specimens in patients 90 years of age and older), is one component of a comprehensive surveillance program. It is not intended to diagnose infection nor to guide or monitor treatment. Performed at [facility], [place] [address] [place], [place] [address]   . Sodium 12/03/2018 138  135 - 145 mmol/L Final  . Potassium 12/03/2018 3.6  3.5 - 5.1 mmol/L Final  . Chloride 12/03/2018 102  98 - 111 mmol/L Final  .  CO2 12/03/2018 26  22 - 32 mmol/L Final  . Glucose, Bld 12/03/2018 109* 70 - 99 mg/dL Final  . BUN 12/03/2018 16  8 - 23 mg/dL Final  . Creatinine, Ser 12/03/2018 1.11  0.61 - 1.24 mg/dL Final  . Calcium 12/03/2018 9.7  8.9 - 10.3 mg/dL Final  . GFR calc non Af Amer 12/03/2018 >60  >60 mL/min Final  . GFR calc Af Amer 12/03/2018 >60  >60 mL/min Final  . Anion gap 12/03/2018 10  5 - 15 Final   Performed at [facility], [place] [address] [place], [place] [address]  . WBC 12/03/2018 5.2  4.0 - 10.5 K/uL Final  . RBC 12/03/2018 5.06  4.22 - 5.81 MIL/uL Final  . Hemoglobin 12/03/2018 15.3  13.0 - 17.0 g/dL Final  . HCT 12/03/2018 46.3  39.0 - 52.0 % Final  . MCV 12/03/2018 91.5  80.0 - 100.0 fL Final  . MCH 12/03/2018 30.2  26.0 - 34.0 pg Final  . MCHC 12/03/2018 33.0  30.0 - 36.0 g/dL Final  . RDW 12/03/2018 13.9  11.5 - 15.5 % Final  . Platelets 12/03/2018 190  150 - 400 K/uL Final  . nRBC 12/03/2018 0.0  0.0 - 0.2 % Final   Performed at [facility], [place] [address], [place], [place] [address]  Office Visit on 10/16/2018  Component Date Value Ref Range Status  . CRP 10/16/2018 1.1  <8.0 mg/L Final  . Sed Rate 10/16/2018 6  0 - 20 mm/h Final  . WBC 10/16/2018 4.2  3.8 - 10.8 Thousand/uL Final  . RBC 10/16/2018 5.45  4.20 - 5.80 Million/uL Final  . Hemoglobin 10/16/2018 16.6  13.2 - 17.1 g/dL Final  . HCT 10/16/2018 48.1  38.5 - 50.0 % Final  . MCV 10/16/2018 88.3  80.0 - 100.0 fL Final  . MCH 10/16/2018 30.5  27.0 - 33.0 pg Final  . MCHC 10/16/2018 34.5  32.0 - 36.0 g/dL Final  . RDW 10/16/2018 14.2  11.0 - 15.0 % Final  . Platelets 10/16/2018 198  140 - 400 Thousand/uL Final  . MPV 10/16/2018 12.2  7.5 - 12.5 fL Final  . Neutro Abs 10/16/2018 2,243  1,500 - 7,800 cells/uL Final  . Lymphs Abs 10/16/2018 1,109  850 - 3,900 cells/uL Final  . Absolute Monocytes 10/16/2018 533  200 - 950 cells/uL Final  . Eosinophils Absolute 10/16/2018 273  15 - 500 cells/uL Final  .  Basophils Absolute 10/16/2018 42  0 - 200 cells/uL Final  . Neutrophils Relative % 10/16/2018 53.4  % Final  . Total Lymphocyte 10/16/2018 26.4  % Final  . Monocytes Relative 10/16/2018 12.7  % Final  . Eosinophils Relative 10/16/2018 6.5  % Final  . Basophils Relative 10/16/2018 1.0  % Final  . Glucose, Bld 10/16/2018 99  65 - 99 mg/dL Final  Comment: .            Fasting reference interval .   . BUN 10/16/2018 17  7 - 25 mg/dL Final  . Creat 10/16/2018 1.22  0.70 - 1.25 mg/dL Final   Comment: For patients >25 years of age, the reference limit for Creatinine is approximately 13% higher for people identified as African-American. .   . GFR, Est Non African American 10/16/2018 63  > OR = 60 mL/min/1.74m2 Final  . GFR, Est African American 10/16/2018 73  > OR = 60 mL/min/1.86m2 Final  . BUN/Creatinine Ratio 82/42/3536 NOT APPLICABLE  6 - 22 (calc) Final  . Sodium 10/16/2018 140  135 - 146 mmol/L Final  . Potassium 10/16/2018 4.0  3.5 - 5.3 mmol/L Final  . Chloride 10/16/2018 102  98 - 110 mmol/L Final  . CO2 10/16/2018 28  20 - 32 mmol/L Final  . Calcium 10/16/2018 10.0  8.6 - 10.3 mg/dL Final  . Total Protein 10/16/2018 6.9  6.1 - 8.1 g/dL Final  . Albumin 10/16/2018 4.3  3.6 - 5.1 g/dL Final  . Globulin 10/16/2018 2.6  1.9 - 3.7 g/dL (calc) Final  . AG Ratio 10/16/2018 1.7  1.0 - 2.5 (calc) Final  . Total Bilirubin 10/16/2018 0.6  0.2 - 1.2 mg/dL Final  . Alkaline phosphatase (APISO) 10/16/2018 92  35 - 144 U/L Final  . AST 10/16/2018 20  10 - 35 U/L Final  . ALT 10/16/2018 20  9 - 46 U/L Final     X-Rays:Dg Shoulder Left Port  Result Date: 12/05/2018 CLINICAL DATA:  Reverse total left shoulder arthroplasty. EXAM: LEFT SHOULDER - 1 VIEW COMPARISON:  07/16/2018 and 06/11/2018 FINDINGS: Evidence of patient's left shoulder arthroplasty intact and normally located. No acute fracture or dislocation. IMPRESSION: Left shoulder arthroplasty intact. Electronically Signed   By: Marin Olp M.D.   On: 12/05/2018 09:42    EKG: Orders placed or performed during the hospital encounter of 12/31/17  . EKG 12-Lead  . EKG 12-Lead     Hospital Course: Geoffrey West is a 63 y.o. who was admitted to Hospital. They were brought to the operating room on 12/05/2018 and underwent Procedure(s): Left reverse shoulder explant, bone grafting.  Patient tolerated the procedure well and was later transferred to the recovery room and then to the orthopaedic floor for postoperative care.  They were given PO and IV analgesics for pain control following their surgery.  They were given 24 hours of postoperative antibiotics of  Anti-infectives (From admission, onward)   Start     Dose/Rate Route Frequency Ordered Stop   12/05/18 2200  cephALEXin (KEFLEX) capsule 1,000 mg  Status:  Discontinued     1,000 mg Oral 2 times daily 12/05/18 1302 12/06/18 1304   12/05/18 0837  vancomycin (VANCOCIN) powder  Status:  Discontinued       As needed 12/05/18 0837 12/05/18 0903   12/05/18 0630  ceFAZolin (ANCEF) IVPB 2g/100 mL premix     2 g 200 mL/hr over 30 Minutes Intravenous On call to O.R. 12/05/18 1443 12/05/18 0739     and started on DVT prophylaxis in the form of Aspirin.  OT were ordered for total joint protocol.   By day one, the patient had progressed with therapy and meeting their goals.  Incision was healing well.  Patient was seen in rounds and was ready to go home.   Diet: Regular diet Activity:NWB Follow-up:in 2 weeks Disposition - Home Discharged Condition: good  Discharge Instructions    Call MD / Call 911   Complete by: As directed    If you experience chest pain or shortness of breath, CALL 911 and be transported to the hospital emergency room.  If you develope a fever above 101 F, pus (white drainage) or increased drainage or redness at the wound, or calf pain, call your surgeon's office.   Constipation Prevention   Complete by: As directed    Drink plenty of fluids.  Prune  juice may be helpful.  You may use a stool softener, such as Colace (over the counter) 100 mg twice a day.  Use MiraLax (over the counter) for constipation as needed.   Diet - low sodium heart healthy   Complete by: As directed    Increase activity slowly as tolerated   Complete by: As directed      Allergies as of 12/06/2018      Reactions   Fentanyl Shortness Of Breath, Other (See Comments)   Reaction to [facility] ONLY > ? DOSE REGULATION ?   Oxycodone Other (See Comments)   Pt states he goes through hot and cold flashes withdrawal like symptoms      Medication List    TAKE these medications   allopurinol 300 MG tablet Commonly known as: ZYLOPRIM Take 300 mg by mouth daily.   amLODipine 10 MG tablet Commonly known as: NORVASC Take 10 mg by mouth at bedtime.   anastrozole 1 MG tablet Commonly known as: ARIMIDEX Take 0.5 mg by mouth every Wednesday.   celecoxib 200 MG capsule Commonly known as: CELEBREX Take 200 mg by mouth 2 (two) times daily.   cephALEXin 500 MG capsule Commonly known as: KEFLEX Take 1 capsule (500 mg total) by mouth 4 (four) times daily. What changed:   how much to take  when to take this   clonazePAM 1 MG tablet Commonly known as: KLONOPIN Take 1 mg by mouth daily as needed for anxiety.   CoQ10 200 MG Caps Take 200 mg by mouth daily.   fluticasone 50 MCG/ACT nasal spray Commonly known as: FLONASE Place 2 sprays into both nostrils daily as needed for allergies or rhinitis.   multivitamin with minerals Tabs tablet Take 1 tablet by mouth daily.   pantoprazole 40 MG tablet Commonly known as: PROTONIX Take 40 mg by mouth every morning.   PARoxetine 20 MG tablet Commonly known as: PAXIL Take 20 mg by mouth every morning.   propranolol ER 80 MG 24 hr capsule Commonly known as: INDERAL LA Take 80 mg by mouth every evening.   rizatriptan 10 MG tablet Commonly known as: MAXALT Take 10 mg by mouth as needed for migraine.   testosterone  cypionate 200 MG/ML injection Commonly known as: DEPOTESTOSTERONE CYPIONATE Inject 100 mg into the muscle every Wednesday.      Follow-up Information    [name], MD In 2 weeks.   Specialty: Orthopedic Surgery Why: For suture removal, For wound re-check Contact information: [address] [place] [address] [phone]           Signed: Geralynn Rile, MD Orthopaedic Surgery 12/11/2018, 12:30 PM

## 2018-12-23 DIAGNOSIS — T84418A Breakdown (mechanical) of other internal orthopedic devices, implants and grafts, initial encounter: Secondary | ICD-10-CM | POA: Diagnosis not present

## 2018-12-23 DIAGNOSIS — Z4789 Encounter for other orthopedic aftercare: Secondary | ICD-10-CM | POA: Diagnosis not present

## 2018-12-24 ENCOUNTER — Other Ambulatory Visit (HOSPITAL_COMMUNITY)
Admission: RE | Admit: 2018-12-24 | Discharge: 2018-12-24 | Disposition: A | Discharge status: Home / Self Care | Payer: PPO | Source: Ambulatory Visit | Attending: Orthopedic Surgery | Admitting: Orthopedic Surgery

## 2018-12-24 DIAGNOSIS — Z1159 Encounter for screening for other viral diseases: Secondary | ICD-10-CM | POA: Diagnosis not present

## 2018-12-24 LAB — SARS CORONAVIRUS 2 (TAT 6-24 HRS): SARS Coronavirus 2: NEGATIVE

## 2018-12-25 ENCOUNTER — Encounter (HOSPITAL_COMMUNITY): Payer: Self-pay | Admitting: *Deleted

## 2018-12-25 ENCOUNTER — Other Ambulatory Visit: Payer: Self-pay

## 2018-12-25 NOTE — Anesthesia Preprocedure Evaluation (Addendum)
Anesthesia Evaluation  Patient identified by MRN, date of birth, ID band Patient awake    Reviewed: Allergy & Precautions, H&P , NPO status , Patient's Chart, lab work & pertinent test results  Airway Mallampati: I  TM Distance: >3 FB Neck ROM: Full    Dental no notable dental hx. (+) Teeth Intact, Dental Advisory Given   Pulmonary neg pulmonary ROS,    Pulmonary exam normal breath sounds clear to auscultation       Cardiovascular Exercise Tolerance: Good hypertension, Pt. on medications  Rhythm:Regular Rate:Normal  Stress echo 2011 normal   Neuro/Psych  Headaches, Anxiety Depression    GI/Hepatic Neg liver ROS, GERD  Medicated and Controlled,  Endo/Other  negative endocrine ROS  Renal/GU negative Renal ROS  negative genitourinary   Musculoskeletal  (+) Arthritis ,   Abdominal   Peds  Hematology negative hematology ROS (+)   Anesthesia Other Findings   Reproductive/Obstetrics negative OB ROS                           Anesthesia Physical  Anesthesia Plan  ASA: II  Anesthesia Plan: General and Regional   Post-op Pain Management:  Regional for Post-op pain   Induction: Intravenous  PONV Risk Score and Plan: 2 and Ondansetron, Dexamethasone and Midazolam  Airway Management Planned: Oral ETT  Additional Equipment:   Intra-op Plan:   Post-operative Plan: Extubation in OR  Informed Consent: I have reviewed the patients History and Physical, chart, labs and discussed the procedure including the risks, benefits and alternatives for the proposed anesthesia with the patient or authorized representative who has indicated his/her understanding and acceptance.     Dental advisory given  Plan Discussed with: CRNA  Anesthesia Plan Comments:        Anesthesia Quick Evaluation

## 2018-12-25 NOTE — Progress Notes (Signed)
Pt denies SOB, chest pain, and being under the care of a cardiologist. Pt denies having a cardiac cath but stated that a stress test was performed  " around 9 years ago." Pt denies having a chest x ray within the last year. Pt denies recent labs. Pt made aware to stop taking Aspirin (unless otherwise advised by surgeon), vitamins, fish oil, CO Q 10 and herbal medications. Do not take any NSAIDs ie: Celebrex, Ibuprofen, Advil, Naproxen (Aleve), Motrin, BC and Goody Powder.  Pt denies that he and family members tested positive for COVID-19 ( pt tested on 12/24/18 and reminded to quarantine). Pt denies that he and family members experienced the following symptoms:  Cough yes/no: No Fever (>100.65F)  yes/no: No Runny nose yes/no: No Sore throat yes/no: No Difficulty breathing/shortness of breath  yes/no: No  Have you or a family member traveled in the last 14 days and where? yes/no: No  Pt reminded that hospital visitation restrictions are in effect and the importance of the restrictions.   Pt verbalized understanding of all pre-op instructions.

## 2018-12-26 ENCOUNTER — Inpatient Hospital Stay (HOSPITAL_COMMUNITY): Payer: PPO

## 2018-12-26 ENCOUNTER — Inpatient Hospital Stay (HOSPITAL_COMMUNITY): Payer: PPO | Admitting: Anesthesiology

## 2018-12-26 ENCOUNTER — Other Ambulatory Visit: Payer: Self-pay

## 2018-12-26 ENCOUNTER — Encounter (HOSPITAL_COMMUNITY)
Admission: RE | Disposition: A | Discharge status: Home / Self Care | Payer: Self-pay | Source: Home / Self Care | Attending: Orthopedic Surgery

## 2018-12-26 ENCOUNTER — Encounter (HOSPITAL_COMMUNITY): Payer: Self-pay

## 2018-12-26 ENCOUNTER — Inpatient Hospital Stay (HOSPITAL_COMMUNITY)
Admission: RE | Admit: 2018-12-26 | Discharge: 2018-12-27 | DRG: 483 | Disposition: A | Discharge status: Home / Self Care | Payer: PPO | Attending: Orthopedic Surgery | Admitting: Orthopedic Surgery

## 2018-12-26 DIAGNOSIS — T8459XA Infection and inflammatory reaction due to other internal joint prosthesis, initial encounter: Secondary | ICD-10-CM | POA: Diagnosis not present

## 2018-12-26 DIAGNOSIS — Y792 Prosthetic and other implants, materials and accessory orthopedic devices associated with adverse incidents: Secondary | ICD-10-CM | POA: Diagnosis present

## 2018-12-26 DIAGNOSIS — E291 Testicular hypofunction: Secondary | ICD-10-CM | POA: Diagnosis not present

## 2018-12-26 DIAGNOSIS — F419 Anxiety disorder, unspecified: Secondary | ICD-10-CM | POA: Diagnosis present

## 2018-12-26 DIAGNOSIS — Z96653 Presence of artificial knee joint, bilateral: Secondary | ICD-10-CM | POA: Diagnosis not present

## 2018-12-26 DIAGNOSIS — Y831 Surgical operation with implant of artificial internal device as the cause of abnormal reaction of the patient, or of later complication, without mention of misadventure at the time of the procedure: Secondary | ICD-10-CM | POA: Diagnosis present

## 2018-12-26 DIAGNOSIS — Z7951 Long term (current) use of inhaled steroids: Secondary | ICD-10-CM | POA: Diagnosis not present

## 2018-12-26 DIAGNOSIS — T84038A Mechanical loosening of other internal prosthetic joint, initial encounter: Principal | ICD-10-CM | POA: Diagnosis present

## 2018-12-26 DIAGNOSIS — G43909 Migraine, unspecified, not intractable, without status migrainosus: Secondary | ICD-10-CM | POA: Diagnosis present

## 2018-12-26 DIAGNOSIS — Z1159 Encounter for screening for other viral diseases: Secondary | ICD-10-CM | POA: Diagnosis not present

## 2018-12-26 DIAGNOSIS — F418 Other specified anxiety disorders: Secondary | ICD-10-CM | POA: Diagnosis not present

## 2018-12-26 DIAGNOSIS — M109 Gout, unspecified: Secondary | ICD-10-CM | POA: Diagnosis present

## 2018-12-26 DIAGNOSIS — Z96612 Presence of left artificial shoulder joint: Secondary | ICD-10-CM | POA: Diagnosis not present

## 2018-12-26 DIAGNOSIS — M1990 Unspecified osteoarthritis, unspecified site: Secondary | ICD-10-CM | POA: Diagnosis present

## 2018-12-26 DIAGNOSIS — Z79899 Other long term (current) drug therapy: Secondary | ICD-10-CM | POA: Diagnosis not present

## 2018-12-26 DIAGNOSIS — Z8261 Family history of arthritis: Secondary | ICD-10-CM

## 2018-12-26 DIAGNOSIS — G8918 Other acute postprocedural pain: Secondary | ICD-10-CM | POA: Diagnosis not present

## 2018-12-26 DIAGNOSIS — Z885 Allergy status to narcotic agent status: Secondary | ICD-10-CM

## 2018-12-26 DIAGNOSIS — Z471 Aftercare following joint replacement surgery: Secondary | ICD-10-CM | POA: Diagnosis not present

## 2018-12-26 DIAGNOSIS — K219 Gastro-esophageal reflux disease without esophagitis: Secondary | ICD-10-CM | POA: Diagnosis not present

## 2018-12-26 DIAGNOSIS — Z96611 Presence of right artificial shoulder joint: Secondary | ICD-10-CM | POA: Diagnosis not present

## 2018-12-26 DIAGNOSIS — I10 Essential (primary) hypertension: Secondary | ICD-10-CM | POA: Diagnosis present

## 2018-12-26 DIAGNOSIS — Z79811 Long term (current) use of aromatase inhibitors: Secondary | ICD-10-CM | POA: Diagnosis not present

## 2018-12-26 DIAGNOSIS — Z791 Long term (current) use of non-steroidal anti-inflammatories (NSAID): Secondary | ICD-10-CM | POA: Diagnosis not present

## 2018-12-26 HISTORY — PX: REVISION OF TOTAL SHOULDER: SUR1277

## 2018-12-26 HISTORY — DX: Presence of spectacles and contact lenses: Z97.3

## 2018-12-26 HISTORY — DX: Mechanical loosening of other internal prosthetic joint, initial encounter: T84.038A

## 2018-12-26 HISTORY — PX: TOTAL SHOULDER REVISION: SHX6130

## 2018-12-26 HISTORY — DX: Mechanical loosening of other internal prosthetic joint, initial encounter: Z96.619

## 2018-12-26 LAB — BASIC METABOLIC PANEL
Anion gap: 10 (ref 5–15)
BUN: 21 mg/dL (ref 8–23)
CO2: 27 mmol/L (ref 22–32)
Calcium: 9.9 mg/dL (ref 8.9–10.3)
Chloride: 102 mmol/L (ref 98–111)
Creatinine, Ser: 1.52 mg/dL — ABNORMAL HIGH (ref 0.61–1.24)
GFR calc Af Amer: 56 mL/min — ABNORMAL LOW (ref >60)
GFR calc non Af Amer: 48 mL/min — ABNORMAL LOW (ref >60)
Glucose, Bld: 101 mg/dL — ABNORMAL HIGH (ref 70–99)
Potassium: 3.6 mmol/L (ref 3.5–5.1)
Sodium: 139 mmol/L (ref 135–145)

## 2018-12-26 LAB — CBC
HCT: 46.4 % (ref 39.0–52.0)
Hemoglobin: 15.2 g/dL (ref 13.0–17.0)
MCH: 29.8 pg (ref 26.0–34.0)
MCHC: 32.8 g/dL (ref 30.0–36.0)
MCV: 91 fL (ref 80.0–100.0)
Platelets: 205 10*3/uL (ref 150–400)
RBC: 5.1 MIL/uL (ref 4.22–5.81)
RDW: 13.6 % (ref 11.5–15.5)
WBC: 6.7 10*3/uL (ref 4.0–10.5)
nRBC: 0 % (ref 0.0–0.2)

## 2018-12-26 SURGERY — REVISION, TOTAL ARTHROPLASTY, SHOULDER
Anesthesia: Regional | Site: Shoulder | Laterality: Left

## 2018-12-26 MED ORDER — VANCOMYCIN HCL 1000 MG IV SOLR
INTRAVENOUS | Status: DC | PRN
  Administered 2018-12-26: 1000 mg via TOPICAL

## 2018-12-26 MED ORDER — PROPRANOLOL HCL ER 80 MG PO CP24
80.0000 mg | ORAL_CAPSULE | Freq: Every evening | ORAL | Status: DC
  Administered 2018-12-26: 20:00:00 80 mg via ORAL
  Filled 2018-12-26: qty 1

## 2018-12-26 MED ORDER — PROPOFOL 10 MG/ML IV BOLUS
INTRAVENOUS | Status: DC | PRN
  Administered 2018-12-26: 200 mg via INTRAVENOUS

## 2018-12-26 MED ORDER — ACETAMINOPHEN 500 MG PO TABS
1000.0000 mg | ORAL_TABLET | Freq: Once | ORAL | Status: AC
  Administered 2018-12-26: 1000 mg via ORAL
  Filled 2018-12-26: qty 2

## 2018-12-26 MED ORDER — PAROXETINE HCL 20 MG PO TABS
20.0000 mg | ORAL_TABLET | ORAL | Status: DC
  Administered 2018-12-27: 09:00:00 20 mg via ORAL
  Filled 2018-12-26: qty 1

## 2018-12-26 MED ORDER — HYDROCODONE-ACETAMINOPHEN 7.5-325 MG PO TABS: 1.0000 | ORAL_TABLET | ORAL | Status: DC | PRN

## 2018-12-26 MED ORDER — PROPOFOL 10 MG/ML IV BOLUS
INTRAVENOUS | Status: AC
  Filled 2018-12-26: qty 20

## 2018-12-26 MED ORDER — CEPHALEXIN 500 MG PO CAPS
1000.0000 mg | ORAL_CAPSULE | Freq: Two times a day (BID) | ORAL | Status: DC
  Administered 2018-12-27: 09:00:00 1000 mg via ORAL
  Filled 2018-12-26: qty 2

## 2018-12-26 MED ORDER — DEXAMETHASONE SODIUM PHOSPHATE 10 MG/ML IJ SOLN
INTRAMUSCULAR | Status: DC | PRN
  Administered 2018-12-26: 10 mg via INTRAVENOUS

## 2018-12-26 MED ORDER — TRANEXAMIC ACID-NACL 1000-0.7 MG/100ML-% IV SOLN
1000.0000 mg | INTRAVENOUS | Status: AC
  Administered 2018-12-26: 08:00:00 1000 mg via INTRAVENOUS
  Filled 2018-12-26: qty 100

## 2018-12-26 MED ORDER — ONDANSETRON HCL 4 MG/2ML IJ SOLN
INTRAMUSCULAR | Status: DC | PRN
  Administered 2018-12-26: 4 mg via INTRAVENOUS

## 2018-12-26 MED ORDER — BUPIVACAINE HCL (PF) 0.5 % IJ SOLN
INTRAMUSCULAR | Status: DC | PRN
  Administered 2018-12-26: 15 mL via PERINEURAL

## 2018-12-26 MED ORDER — SUGAMMADEX SODIUM 200 MG/2ML IV SOLN
INTRAVENOUS | Status: DC | PRN
  Administered 2018-12-26: 200 mg via INTRAVENOUS

## 2018-12-26 MED ORDER — MIDAZOLAM HCL 2 MG/2ML IJ SOLN
INTRAMUSCULAR | Status: AC
  Filled 2018-12-26: qty 2

## 2018-12-26 MED ORDER — AMLODIPINE BESYLATE 10 MG PO TABS
10.0000 mg | ORAL_TABLET | Freq: Every day | ORAL | Status: DC
  Administered 2018-12-26: 10 mg via ORAL
  Filled 2018-12-26: qty 1

## 2018-12-26 MED ORDER — CEFAZOLIN SODIUM-DEXTROSE 2-4 GM/100ML-% IV SOLN
2.0000 g | Freq: Four times a day (QID) | INTRAVENOUS | Status: AC
  Administered 2018-12-26 – 2018-12-27 (×3): 2 g via INTRAVENOUS
  Filled 2018-12-26 (×3): qty 100

## 2018-12-26 MED ORDER — ANASTROZOLE 1 MG PO TABS: 0.5000 mg | ORAL_TABLET | ORAL | Status: DC

## 2018-12-26 MED ORDER — MORPHINE SULFATE (PF) 2 MG/ML IV SOLN: 0.5000 mg | INTRAVENOUS | Status: DC | PRN

## 2018-12-26 MED ORDER — PHENOL 1.4 % MT LIQD: 1.0000 | OROMUCOSAL | Status: DC | PRN

## 2018-12-26 MED ORDER — ONDANSETRON HCL 4 MG/2ML IJ SOLN: 4.0000 mg | Freq: Four times a day (QID) | INTRAMUSCULAR | Status: DC | PRN

## 2018-12-26 MED ORDER — ROCURONIUM BROMIDE 10 MG/ML (PF) SYRINGE
PREFILLED_SYRINGE | INTRAVENOUS | Status: DC | PRN
  Administered 2018-12-26: 70 mg via INTRAVENOUS

## 2018-12-26 MED ORDER — CELECOXIB 200 MG PO CAPS
200.0000 mg | ORAL_CAPSULE | Freq: Two times a day (BID) | ORAL | Status: DC
  Administered 2018-12-26 – 2018-12-27 (×2): 200 mg via ORAL
  Filled 2018-12-26 (×2): qty 1

## 2018-12-26 MED ORDER — SODIUM CHLORIDE 0.9 % IV SOLN
INTRAVENOUS | Status: DC | PRN
  Administered 2018-12-26: 25 ug/min via INTRAVENOUS

## 2018-12-26 MED ORDER — PANTOPRAZOLE SODIUM 40 MG PO TBEC
40.0000 mg | DELAYED_RELEASE_TABLET | ORAL | Status: DC
  Administered 2018-12-27: 09:00:00 40 mg via ORAL
  Filled 2018-12-26: qty 1

## 2018-12-26 MED ORDER — BUPIVACAINE LIPOSOME 1.3 % IJ SUSP
INTRAMUSCULAR | Status: DC | PRN
  Administered 2018-12-26: 10 mL via PERINEURAL

## 2018-12-26 MED ORDER — METOCLOPRAMIDE HCL 5 MG/ML IJ SOLN: 5.0000 mg | Freq: Three times a day (TID) | INTRAMUSCULAR | Status: DC | PRN

## 2018-12-26 MED ORDER — 0.9 % SODIUM CHLORIDE (POUR BTL) OPTIME
TOPICAL | Status: DC | PRN
  Administered 2018-12-26: 1000 mL

## 2018-12-26 MED ORDER — TRANEXAMIC ACID-NACL 1000-0.7 MG/100ML-% IV SOLN
1000.0000 mg | Freq: Once | INTRAVENOUS | Status: AC
  Administered 2018-12-26: 13:00:00 1000 mg via INTRAVENOUS
  Filled 2018-12-26: qty 100

## 2018-12-26 MED ORDER — HYDROCODONE-ACETAMINOPHEN 5-325 MG PO TABS: 1.0000 | ORAL_TABLET | ORAL | Status: DC | PRN

## 2018-12-26 MED ORDER — VANCOMYCIN HCL 1000 MG IV SOLR
INTRAVENOUS | Status: AC
  Filled 2018-12-26: qty 1000

## 2018-12-26 MED ORDER — DOCUSATE SODIUM 100 MG PO CAPS
100.0000 mg | ORAL_CAPSULE | Freq: Two times a day (BID) | ORAL | Status: DC
  Administered 2018-12-26 – 2018-12-27 (×2): 100 mg via ORAL
  Filled 2018-12-26 (×2): qty 1

## 2018-12-26 MED ORDER — LIDOCAINE 2% (20 MG/ML) 5 ML SYRINGE
INTRAMUSCULAR | Status: DC | PRN
  Administered 2018-12-26: 100 mg via INTRAVENOUS

## 2018-12-26 MED ORDER — LACTATED RINGERS IV SOLN
INTRAVENOUS | Status: DC | PRN
  Administered 2018-12-26 (×2): via INTRAVENOUS

## 2018-12-26 MED ORDER — GLYCOPYRROLATE 0.2 MG/ML IJ SOLN
INTRAMUSCULAR | Status: DC | PRN
  Administered 2018-12-26: 0.2 mg via INTRAVENOUS

## 2018-12-26 MED ORDER — FENTANYL CITRATE (PF) 250 MCG/5ML IJ SOLN
INTRAMUSCULAR | Status: AC
  Filled 2018-12-26: qty 5

## 2018-12-26 MED ORDER — ACETAMINOPHEN 500 MG PO TABS
500.0000 mg | ORAL_TABLET | Freq: Four times a day (QID) | ORAL | Status: AC
  Administered 2018-12-26 – 2018-12-27 (×4): 500 mg via ORAL
  Filled 2018-12-26 (×4): qty 1

## 2018-12-26 MED ORDER — ALLOPURINOL 300 MG PO TABS
300.0000 mg | ORAL_TABLET | Freq: Every day | ORAL | Status: DC
  Administered 2018-12-27: 09:00:00 300 mg via ORAL
  Filled 2018-12-26: qty 1

## 2018-12-26 MED ORDER — PHENYLEPHRINE 40 MCG/ML (10ML) SYRINGE FOR IV PUSH (FOR BLOOD PRESSURE SUPPORT)
PREFILLED_SYRINGE | INTRAVENOUS | Status: DC | PRN
  Administered 2018-12-26: 160 ug via INTRAVENOUS
  Administered 2018-12-26: 80 ug via INTRAVENOUS
  Administered 2018-12-26: 160 ug via INTRAVENOUS

## 2018-12-26 MED ORDER — ACETAMINOPHEN 325 MG PO TABS: 325.0000 mg | ORAL_TABLET | Freq: Four times a day (QID) | ORAL | Status: DC | PRN

## 2018-12-26 MED ORDER — FENTANYL CITRATE (PF) 100 MCG/2ML IJ SOLN: 25.0000 ug | INTRAMUSCULAR | Status: DC | PRN

## 2018-12-26 MED ORDER — EPHEDRINE SULFATE 50 MG/ML IJ SOLN
INTRAMUSCULAR | Status: DC | PRN
  Administered 2018-12-26: 12.5 mg via INTRAVENOUS
  Administered 2018-12-26: 10 mg via INTRAVENOUS
  Administered 2018-12-26: 7.5 mg via INTRAVENOUS

## 2018-12-26 MED ORDER — GLYCOPYRROLATE PF 0.2 MG/ML IJ SOSY
PREFILLED_SYRINGE | INTRAMUSCULAR | Status: AC
  Filled 2018-12-26: qty 5

## 2018-12-26 MED ORDER — ONDANSETRON HCL 4 MG PO TABS: 4.0000 mg | ORAL_TABLET | Freq: Four times a day (QID) | ORAL | Status: DC | PRN

## 2018-12-26 MED ORDER — FLUTICASONE PROPIONATE 50 MCG/ACT NA SUSP
2.0000 | Freq: Every day | NASAL | Status: DC | PRN
  Filled 2018-12-26: qty 16

## 2018-12-26 MED ORDER — FENTANYL CITRATE (PF) 250 MCG/5ML IJ SOLN
INTRAMUSCULAR | Status: DC | PRN
  Administered 2018-12-26 (×2): 50 ug via INTRAVENOUS

## 2018-12-26 MED ORDER — CHLORHEXIDINE GLUCONATE 4 % EX LIQD: 60.0000 mL | Freq: Once | CUTANEOUS | Status: DC

## 2018-12-26 MED ORDER — MIDAZOLAM HCL 5 MG/5ML IJ SOLN
INTRAMUSCULAR | Status: DC | PRN
  Administered 2018-12-26: 2 mg via INTRAVENOUS

## 2018-12-26 MED ORDER — VANCOMYCIN HCL IN DEXTROSE 1-5 GM/200ML-% IV SOLN
INTRAVENOUS | Status: AC
  Filled 2018-12-26: qty 200

## 2018-12-26 MED ORDER — SUMATRIPTAN SUCCINATE 50 MG PO TABS
50.0000 mg | ORAL_TABLET | ORAL | Status: DC | PRN
  Filled 2018-12-26: qty 1

## 2018-12-26 MED ORDER — MENTHOL 3 MG MT LOZG
1.0000 | LOZENGE | OROMUCOSAL | Status: DC | PRN
  Administered 2018-12-26: 13:00:00 3 mg via ORAL
  Filled 2018-12-26: qty 9

## 2018-12-26 MED ORDER — METOCLOPRAMIDE HCL 5 MG PO TABS: 5.0000 mg | ORAL_TABLET | Freq: Three times a day (TID) | ORAL | Status: DC | PRN

## 2018-12-26 MED ORDER — CLONAZEPAM 1 MG PO TABS: 1.0000 mg | ORAL_TABLET | Freq: Two times a day (BID) | ORAL | Status: DC | PRN

## 2018-12-26 MED ORDER — CEFAZOLIN SODIUM-DEXTROSE 2-4 GM/100ML-% IV SOLN
2.0000 g | INTRAVENOUS | Status: AC
  Administered 2018-12-26: 2 g via INTRAVENOUS
  Filled 2018-12-26: qty 100

## 2018-12-26 MED ORDER — GABAPENTIN 300 MG PO CAPS
300.0000 mg | ORAL_CAPSULE | Freq: Three times a day (TID) | ORAL | Status: DC
  Administered 2018-12-26 – 2018-12-27 (×4): 300 mg via ORAL
  Filled 2018-12-26 (×4): qty 1

## 2018-12-26 SURGICAL SUPPLY — 53 items
ALCOHOL 70% 16 OZ (MISCELLANEOUS) ×2 IMPLANT
BIT DRILL 5/64X5 DISP (BIT) IMPLANT
BLADE SAG 18X100X1.27 (BLADE) ×2 IMPLANT
COVER SURGICAL LIGHT HANDLE (MISCELLANEOUS) ×2 IMPLANT
COVER WAND RF STERILE (DRAPES) ×2 IMPLANT
DRAPE INCISE IOBAN 66X45 STRL (DRAPES) ×2 IMPLANT
DRAPE ORTHO SPLIT 77X108 STRL (DRAPES) ×2
DRAPE SURG ORHT 6 SPLT 77X108 (DRAPES) ×2 IMPLANT
DRAPE U-SHAPE 47X51 STRL (DRAPES) ×2 IMPLANT
DRSG ADAPTIC 3X8 NADH LF (GAUZE/BANDAGES/DRESSINGS) ×2 IMPLANT
DRSG PAD ABDOMINAL 8X10 ST (GAUZE/BANDAGES/DRESSINGS) ×2 IMPLANT
DURAPREP 26ML APPLICATOR (WOUND CARE) ×2 IMPLANT
ELECT BLADE 4.0 EZ CLEAN MEGAD (MISCELLANEOUS) ×2
ELECT REM PT RETURN 9FT ADLT (ELECTROSURGICAL) ×2
ELECTRODE BLDE 4.0 EZ CLN MEGD (MISCELLANEOUS) ×1 IMPLANT
ELECTRODE REM PT RTRN 9FT ADLT (ELECTROSURGICAL) ×1 IMPLANT
GAUZE SPONGE 4X4 12PLY STRL (GAUZE/BANDAGES/DRESSINGS) ×2 IMPLANT
GLOVE BIO SURGEON STRL SZ7.5 (GLOVE) ×2 IMPLANT
GLOVE BIOGEL PI IND STRL 8 (GLOVE) ×1 IMPLANT
GLOVE BIOGEL PI INDICATOR 8 (GLOVE) ×1
GOWN STRL REUS W/ TWL LRG LVL3 (GOWN DISPOSABLE) IMPLANT
GOWN STRL REUS W/ TWL XL LVL3 (GOWN DISPOSABLE) ×2 IMPLANT
GOWN STRL REUS W/TWL LRG LVL3 (GOWN DISPOSABLE)
GOWN STRL REUS W/TWL XL LVL3 (GOWN DISPOSABLE) ×2
HUMERAL HEAD EXTEND 24MMX48MM (Orthopedic Implant) ×1 IMPLANT
KIT BASIN OR (CUSTOM PROCEDURE TRAY) ×2 IMPLANT
KIT TURNOVER KIT B (KITS) ×2 IMPLANT
MANIFOLD NEPTUNE II (INSTRUMENTS) ×2 IMPLANT
NS IRRIG 1000ML POUR BTL (IV SOLUTION) ×2 IMPLANT
PACK SHOULDER (CUSTOM PROCEDURE TRAY) ×2 IMPLANT
PAD ABD 8X10 STRL (GAUZE/BANDAGES/DRESSINGS) ×2 IMPLANT
PAD ARMBOARD 7.5X6 YLW CONV (MISCELLANEOUS) ×4 IMPLANT
SLING ARM FOAM STRAP LRG (SOFTGOODS) ×1 IMPLANT
SLING ARM FOAM STRAP MED (SOFTGOODS) IMPLANT
SMARTMIX MINI TOWER (MISCELLANEOUS)
SPONGE LAP 18X18 RF (DISPOSABLE) ×2 IMPLANT
SPONGE LAP 4X18 RFD (DISPOSABLE) IMPLANT
STRIP CLOSURE SKIN 1/2X4 (GAUZE/BANDAGES/DRESSINGS) ×2 IMPLANT
SUCTION FRAZIER HANDLE 10FR (MISCELLANEOUS) ×1
SUCTION TUBE FRAZIER 10FR DISP (MISCELLANEOUS) ×1 IMPLANT
SUT ETHILON 3 0 PS 1 (SUTURE) ×1 IMPLANT
SUT FIBERWIRE #2 38 T-5 BLUE (SUTURE) ×4
SUT MNCRL AB 4-0 PS2 18 (SUTURE) ×2 IMPLANT
SUT PDS AB 0 CT 36 (SUTURE) ×1 IMPLANT
SUT PDS AB 2-0 CT1 27 (SUTURE) ×1 IMPLANT
SUT VIC AB 2-0 CT1 27 (SUTURE) ×1
SUT VIC AB 2-0 CT1 TAPERPNT 27 (SUTURE) ×1 IMPLANT
SUTURE FIBERWR #2 38 T-5 BLUE (SUTURE) ×2 IMPLANT
TAPE CLOTH SURG 6X10 WHT LF (GAUZE/BANDAGES/DRESSINGS) ×1 IMPLANT
TOWEL GREEN STERILE (TOWEL DISPOSABLE) ×2 IMPLANT
TOWER SMARTMIX MINI (MISCELLANEOUS) IMPLANT
WATER STERILE IRR 1000ML POUR (IV SOLUTION) ×2 IMPLANT
YANKAUER SUCT BULB TIP NO VENT (SUCTIONS) ×2 IMPLANT

## 2018-12-26 NOTE — Anesthesia Postprocedure Evaluation (Addendum)
Anesthesia Post Note  Patient: Adante Courington [facility]  Procedure(s) Performed: Left prosthetic shoulder 1 component revision (Left Shoulder)     Patient location during evaluation: PACU Anesthesia Type: General and Regional Level of consciousness: awake and alert Pain management: pain level controlled Vital Signs Assessment: post-procedure vital signs reviewed and stable Respiratory status: spontaneous breathing, nonlabored ventilation, respiratory function stable and patient connected to nasal cannula oxygen Cardiovascular status: blood pressure returned to baseline and stable Postop Assessment: no apparent nausea or vomiting Anesthetic complications: no    Last Vitals:  Vitals:   12/26/18 1001 12/26/18 1002  BP:  (!) 125/93  Pulse:    Resp: 17 15  Temp:  (!) 36.3 C  SpO2: 100% 100%    Last Pain:  Vitals:   12/26/18 1000  PainSc: 0-No pain                 Chelsey L Woodrum

## 2018-12-26 NOTE — Anesthesia Procedure Notes (Signed)
Anesthesia Regional Block: Interscalene brachial plexus block   Pre-Anesthetic Checklist: ,, timeout performed, Correct Patient, Correct Site, Correct Laterality, Correct Procedure, Correct Position, site marked, Risks and benefits discussed,  Surgical consent,  Pre-op evaluation,  At surgeon's request and post-op pain management  Laterality: Left  Prep: Maximum Sterile Barrier Precautions used, chloraprep       Needles:  Injection technique: Single-shot  Needle Type: Echogenic Stimulator Needle     Needle Length: 5cm  Needle Gauge: 22     Additional Needles:   Procedures:,,,, ultrasound used (permanent image in chart),,,,  Narrative:  Start time: 12/26/2018 7:08 AM End time: 12/26/2018 7:18 AM Injection made incrementally with aspirations every 5 mL.  Performed by: Personally  Anesthesiologist: Freddrick March, MD  Additional Notes: Monitors applied. No increased pain on injection. No increased resistance to injection. Injection made in 5cc increments. Good needle visualization. Patient tolerated procedure well.

## 2018-12-26 NOTE — Discharge Instructions (Signed)
-  Maintain sling for comfort only.  You may remove the sling when pain is well controlled and use the left arm for activities of daily living.  No lifting above 2 pounds with the left arm.  Do not elevate the left arm overhead.  -Apply clean and dry dressing to the left shoulder incision once per day.  He may begin showering on postop day #3.  Do not submerge underwater.  -Apply ice to the left shoulder for 20 to 30 minutes at a time out of each hour that you are awake.  -Return to see Dr. [name] in 2 to 3 weeks for wound check.

## 2018-12-26 NOTE — Transfer of Care (Signed)
Immediate Anesthesia Transfer of Care Note  Patient: Geoffrey West  Procedure(s) Performed: Left prosthetic shoulder 1 component revision (Left Shoulder)  Patient Location: PACU  Anesthesia Type:GA combined with regional for post-op pain  Level of Consciousness: awake, alert  and oriented  Airway & Oxygen Therapy: Patient Spontanous Breathing and Patient connected to nasal cannula oxygen  Post-op Assessment: Report given to RN and Post -op Vital signs reviewed and stable  Post vital signs: Reviewed and stable  Last Vitals:  Vitals Value Taken Time  BP 125/95 12/26/18 0854  Temp    Pulse    Resp 10 12/26/18 0857  SpO2 99 % 12/26/18 0857  Vitals shown include unvalidated device data.  Last Pain:  Vitals:   12/26/18 0854  PainSc: (P) 0-No pain      Patients Stated Pain Goal: 3 (11/88/67 7373)  Complications: No apparent anesthesia complications

## 2018-12-26 NOTE — H&P (Signed)
ORTHOPAEDIC H and P  REQUESTING PHYSICIAN: [name], MD  PCP:  Camille Bal, PA-C  Chief Complaint: Left shoulder pain  HPI: Geoffrey West is a 63 y.o. male who complains of worsening left shoulder pain over the last couple of days.  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  There was uncoupling noted of the head on the stem.  He is here today for revision of that.  No new complaints.  He states that the pain has been increasing a bit over the last couple of days.  Past Medical History:  Diagnosis Date  . Anxiety   . Arthritis    "all over" (03/06/2018)  . GERD (gastroesophageal reflux disease)   . Gout    "on daily RX" (03/06/2018)  . History of kidney stones     passed x 1  . Hypertension   . Hypertension 12/25/2016  . Loosening of total shoulder replacement (HCC)    left  . Low testosterone 12/25/2016  . Migraine    "haven't had more than 1/year since propanolol started" (03/06/2018)  . 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 Situational depression    "recently lost my mom and brother" (03/06/2018)  . Wears contact lenses   . Wears glasses    Past Surgical History:  Procedure Laterality Date  . BACK SURGERY    . COLONOSCOPY    . COLONOSCOPY W/ BIOPSIES AND POLYPECTOMY    . EXCISIONAL TOTAL SHOULDER ARTHROPLASTY WITH ANTIBIOTIC SPACER Left 03/06/2018   Procedure: Left shoulder antibiotic spacer expalnt with glenoid bone grafting, antiobiotic spacer placement;  Surgeon: [name], MD;  Location: [facility];  Service: Orthopedics;  Laterality: Left;  . IR FLUORO GUIDE CV LINE RIGHT  12/26/2016  . KNEE ARTHROSCOPY Bilateral    X 10 total arthroscopies  . KNEE CARTILAGE SURGERY Left 1977   "cartilage removed"  . LASIK Bilateral   . LUMBAR DISC SURGERY  2000s   "ruptured disc"  . REVERSE SHOULDER ARTHROPLASTY Left 12/08/2016   Procedure: Revision left total shoulder to reverse total  shoulder arthroplasty;  Surgeon: [name], MD;  Location: [facility];  Service: Orthopedics;  Laterality: Left;  . REVERSE SHOULDER ARTHROPLASTY Left 01/01/2018   Procedure: Left reverse shouler explant with antibiotic spacer placement;  Surgeon: [name], MD;  Location: [facility];  Service: Orthopedics;  Laterality: Left;  2.5 hrs  . REVERSE SHOULDER ARTHROPLASTY Left 06/11/2018   Procedure: REVERSE TOTAL SHOULDER REVISION;  Surgeon: [name], MD;  Location: [facility];  Service: Orthopedics;  Laterality: Left;  . REVERSE SHOULDER ARTHROPLASTY Left 12/05/2018   Procedure: Left reverse shoulder explant, bone grafting;  Surgeon: [name], MD;  Location: [facility];  Service: Orthopedics;  Laterality: Left;  2.5 hrs  . REVISION TOTAL SHOULDER TO REVERSE TOTAL SHOULDER Left 07/16/2018   Procedure: Left shoulder irrigation and debridement with poly and head ball exchange;  Surgeon: [name], MD;  Location: [facility];  Service: Orthopedics;  Laterality: Left;  2 hrs  . SHOULDER ARTHROSCOPY Right 1980s/1990s   "bone spur removed"  . SHOULDER OPEN ROTATOR CUFF REPAIR Right 1970s  . SHOULDER SURGERY Left 03/06/2018   antibiotic spacer expalnt with glenoid bone grafting, antiobiotic spacer placement  . TOTAL KNEE ARTHROPLASTY Bilateral   . TOTAL SHOULDER ARTHROPLASTY Left ~ 2014-2017 X 2   "in [place]"  . TOTAL SHOULDER REPLACEMENT Right ~ 2009   "in  [place]"  . WISDOM TOOTH EXTRACTION     Social History   Socioeconomic History  . Marital status: Single    Spouse name: Not on file  . Number of children: Not on file  . Years of education: Not on file  . Highest education level: Not on file  Occupational History  . Not on file  Social Needs  . Financial resource strain: Not on file  . Food insecurity    Worry: Not on file    Inability: Not on file  . Transportation needs    Medical: Not on file    Non-medical: Not on file  Tobacco Use  . Smoking  status: Never Smoker  . Smokeless tobacco: Never Used  Substance and Sexual Activity  . Alcohol use: Not Currently  . Drug use: Never  . Sexual activity: Not Currently  Lifestyle  . Physical activity    Days per week: Not on file    Minutes per session: Not on file  . Stress: Not on file  Relationships  . Social Herbalist on phone: Not on file    Gets together: Not on file    Attends religious service: Not on file    Active member of club or organization: Not on file    Attends meetings of clubs or organizations: Not on file    Relationship status: Not on file  Other Topics Concern  . Not on file  Social History Narrative  . Not on file   Family History  Problem Relation Age of Onset  . Arthritis Mother   . Arthritis Sister   . Alzheimer's disease Brother   . Arthritis Brother    Allergies  Allergen Reactions  . Fentanyl Shortness Of Breath and Other (See Comments)    Reaction to [facility] ONLY > ? DOSE REGULATION ?   [name] Oxycodone Other (See Comments)    Pt states he goes through hot and cold flashes withdrawal like symptoms   Prior to Admission medications   Medication Sig Start Date End Date Taking? Authorizing Provider  allopurinol (ZYLOPRIM) 300 MG tablet Take 300 mg by mouth daily.   Yes [provider]  amLODipine (NORVASC) 10 MG tablet Take 10 mg by mouth at bedtime.    Yes [provider]  anastrozole (ARIMIDEX) 1 MG tablet Take 0.5 mg by mouth every Wednesday.  08/10/18  Yes [provider]  celecoxib (CELEBREX) 200 MG capsule Take 200 mg by mouth 2 (two) times daily. 11/06/16  Yes [provider]  cephALEXin (KEFLEX) 500 MG capsule Take 1 capsule (500 mg total) by mouth 4 (four) times daily. Patient taking differently: Take 1,000 mg by mouth 2 (two) times daily.  10/07/18  Yes Tommy Medal, Lavell Islam, MD  clonazePAM (KLONOPIN) 1 MG tablet Take 1 mg by mouth daily as needed for anxiety.   Yes [provider]   Coenzyme Q10 (COQ10) 200 MG CAPS Take 200 mg by mouth daily.    Yes [provider]  fluticasone (FLONASE) 50 MCG/ACT nasal spray Place 2 sprays into both nostrils daily as needed for allergies or rhinitis.   Yes [provider]  Multiple Vitamin (MULTIVITAMIN WITH MINERALS) TABS tablet Take 1 tablet by mouth daily.   Yes [provider]  pantoprazole (PROTONIX) 40 MG tablet Take 40 mg by mouth every morning.    Yes [provider]  PARoxetine (PAXIL) 20 MG tablet Take 20 mg by mouth every morning.   Yes [provider]  propranolol ER (INDERAL LA) 80 MG 24 hr capsule Take 80 mg by mouth every evening.  02/01/18  Yes [provider]  testosterone cypionate (DEPOTESTOSTERONE CYPIONATE) 200 MG/ML injection Inject 100 mg into the muscle every Wednesday.    Yes [provider]  rizatriptan (MAXALT) 10 MG tablet Take 10 mg by mouth as needed for migraine.  08/23/18   [provider]   No results found.  Positive ROS: All other systems have been reviewed and were otherwise negative with the exception of those mentioned in the HPI and as above.  Physical Exam: General: Alert, no acute distress Cardiovascular: No pedal edema Respiratory: No cyanosis, no use of accessory musculature GI: No organomegaly, abdomen is soft and non-tender Skin: No lesions in the area of chief complaint Neurologic: Sensation intact distally Psychiatric: Patient is competent for consent with normal mood and affect Lymphatic: No axillary or cervical lymphadenopathy  MUSCULOSKELETAL:  Skin is clean dry and intact in the left shoulder.  He does have some swelling noted anteriorly.  Assessment: -Failure of left shoulder hemiarthroplasty  Plan: -Plan for revision of the humeral head component today.  We will plan on keeping him overnight postoperatively.  We will have discussed this case at length in the office and again this morning.  All questions were  solicited and answered to his satisfaction.  We discussed the risk of bleeding, infection, dislocation, fracture, infection, need for further surgery, and the risk of anesthesia.  -Plan for overnight admission and hopefully discharge home tomorrow.    [name], MD Cell [phone]    12/26/2018 7:15 AM

## 2018-12-26 NOTE — Op Note (Signed)
Date of Surgery: 12/26/2018  INDICATIONS: Geoffrey West is a 63 y.o.-year-old male with a left hemiarthroplasty with CTA head.  He was noted at his 3-week follow-up appointment to have uncoupled the head from the shaft.  He is here today for revision of that one component.  Prior to the surgical intervention we discussed the risk, benefits and indication for this procedure.  We discussed the risk of bleeding, infection, damage to surrounding neurovascular structures, stiffness, need for further surgery, and the risk of anesthesia;  The patient did consent to the procedure after discussion of the risks and benefits.  PREOPERATIVE DIAGNOSIS:  1.  Left shoulder hemiarthroplasty with loose component    POSTOPERATIVE DIAGNOSIS: Same.  PROCEDURE:  1.  Revision of left shoulder hemi-arthroplasty 1 component, humeral head  SURGEON: Geralynn Rile, M.D.  ASSIST: April Green, RNFA.  ANESTHESIA:  general, and regional  IV FLUIDS AND URINE: See anesthesia.  ESTIMATED BLOOD LOSS: 100 mL.  IMPLANTS:   Biomet CTA head 48/24  DRAINS: none  COMPLICATIONS: None.  DESCRIPTION OF PROCEDURE: The patient was brought to the operating room and placed supine on the operating table.  The patient had been signed prior to the procedure and this was documented. The patient had the anesthesia placed by the anesthesiologist.  He was then placed in standard beachchair position with a well-padded head holding device.  A time-out was performed to confirm that this was the correct patient, site, side and location. The patient did receive antibiotics prior to the incision and was re-dosed during the procedure as needed at indicated intervals.  A tourniquet was not placed.  The patient had the operative extremity prepped and draped in the standard surgical fashion.      We began the procedure by using his previously established deltopectoral incision.  Dissection was carried down atraumatically to the level of the joint  capsule.  The pseudocapsule was entered and deep retractors were placed.  We encountered a large rush of seroma.  This was normal-appearing.  Next we noted the humeral head to be free-floating in the deltopectoral interval.  This was removed and passed off the back table.  Next we placed deep retractors around the glenoid to inspect our previously bone grafted glenoid.  This was noted to be intact with good graft burden in the cavitary lesion of the glenoid.  This was left in place.   We next moved to the humeral side where there was some pseudocapsule noted.  This was sharply excised as well.  We then copiously irrigated this.  We did note that the humeral stem was well fixed and did not show any signs of micro-instability or gross loosening.  At this juncture we began trialing the CTA heads.  We did find that the 54 x 24 head which was previously placed was impinging posteriorly on some heterotrophic bone.  We removed the trial implant and began excising the posterior tuberosity bone.  This was accomplished using osteotome and rondure.  We then downsized to the 48 x 24 mm head.  This was noted to fill the vault appropriately and also not impinge posteriorly.  At this juncture we elected to implant the final humeral head.  This was the 48 mm x 24.  This was impacted into place and we ensured that this would not easily dislodged with aggressive pulling on the head and noted to fit well onto the [facility] taper.  We then reduced the head and he was noted to have good motion  in all planes without any obvious impingement.  We then dislocated the humeral head once again to ensure that it was not going to become loose on the [facility] taper and it did not.  We felt comfortable with this and then reduced one final time.    We then irrigated the wound one final time and placed 1 g of vancomycin powder in the deep space.  The deltopectoral interval was closed with #1 PDS interrupted.  The subcutaneous tissues were closed  with interrupted 2-0 Monocryl.  Skin was closed with 3-0 horizontal mattress nylon.  Skin was cleaned and dried one final time and a sterile bandage was placed.  Patient was awoken from general anesthesia without complications.  There were no known intraoperative complications.  All counts were correct x2.  He was transported to PACU in stable condition.  POSTOPERATIVE PLAN:  Patient will be nonweightbearing to the left upper extremity for approximately 4 weeks.  He can begin active motion as tolerated at the arm, but with forward elevation to not exceed 90 degrees for the first 4 weeks.  He can discontinue his sling when he feels comfortable.  Otherwise it can be worn for comfort.  He will be admitted for routine postoperative care and pain control.

## 2018-12-26 NOTE — Anesthesia Procedure Notes (Signed)
Procedure Name: Intubation Date/Time: 12/26/2018 7:47 AM Performed by: Mariea Clonts, CRNA Pre-anesthesia Checklist: Patient identified, Emergency Drugs available, Suction available and Patient being monitored Patient Re-evaluated:Patient Re-evaluated prior to induction Oxygen Delivery Method: Circle System Utilized Preoxygenation: Pre-oxygenation with 100% oxygen Induction Type: IV induction Ventilation: Mask ventilation without difficulty Laryngoscope Size: Miller and 3 Grade View: Grade I Tube type: Oral Tube size: 8.0 mm Number of attempts: 1 Airway Equipment and Method: Stylet and Oral airway Placement Confirmation: ETT inserted through vocal cords under direct vision,  positive ETCO2 and breath sounds checked- equal and bilateral Secured at: 24 cm Tube secured with: Tape Dental Injury: Teeth and Oropharynx as per pre-operative assessment

## 2018-12-26 NOTE — Plan of Care (Signed)
  Problem: Education: Goal: Knowledge of General Education information will improve Description: Including pain rating scale, medication(s)/side effects and non-pharmacologic comfort measures Outcome: Progressing   Problem: Pain Managment: Goal: General experience of comfort will improve Outcome: Progressing   Problem: Safety: Goal: Ability to remain free from injury will improve Outcome: Progressing   

## 2018-12-26 NOTE — Brief Op Note (Signed)
12/26/2018  8:44 AM  PATIENT:  Geoffrey West  63 y.o. male  PRE-OPERATIVE DIAGNOSIS:  Left prosthetic shoulder failure  POST-OPERATIVE DIAGNOSIS:  Left prosthetic shoulder failure  PROCEDURE:  Procedure(s) with comments: Left prosthetic shoulder 1 component revision (Left) - 90 mins  SURGEON:  Surgeon(s) and Role:    [name], MD - Primary  PHYSICIAN ASSISTANT:   ASSISTANTS: April Green, RNFA   ANESTHESIA:   regional and general  EBL:  100 mL   BLOOD ADMINISTERED:none  DRAINS: none   LOCAL MEDICATIONS USED:  NONE  SPECIMEN:  No Specimen  DISPOSITION OF SPECIMEN:  N/A  COUNTS:  YES  TOURNIQUET:  * No tourniquets in log *  DICTATION: .Note written in EPIC  PLAN OF CARE: Admit to inpatient   PATIENT DISPOSITION:  PACU - hemodynamically stable.   Delay start of Pharmacological VTE agent (>24hrs) due to surgical blood loss or risk of bleeding: not applicable

## 2018-12-27 NOTE — Progress Notes (Signed)
   Subjective:  Patient reports pain as mild.  Comfortable with no complaints  Objective:   VITALS:   Vitals:   12/26/18 2005 12/26/18 2216 12/26/18 2351 12/27/18 0430  BP: 130/88 (!) 132/91 (!) 137/92 127/84  Pulse: 70  61 69  Resp: 17  17 18   Temp: 97.6 F (36.4 C)  97.7 F (36.5 C) (!) 97.5 F (36.4 C)  TempSrc: Oral  Oral Oral  SpO2: 95%  97% 96%  Weight:      Height:        Neurologically intact Neurovascular intact Sensation intact distally Intact pulses distally Incision: dressing C/D/I Compartment soft   Lab Results  Component Value Date   WBC 6.7 12/26/2018   HGB 15.2 12/26/2018   HCT 46.4 12/26/2018   MCV 91.0 12/26/2018   PLT 205 12/26/2018   BMET    Component Value Date/Time   NA 139 12/26/2018 0629   K 3.6 12/26/2018 0629   CL 102 12/26/2018 0629   CO2 27 12/26/2018 0629   GLUCOSE 101 (H) 12/26/2018 0629   BUN 21 12/26/2018 0629   CREATININE 1.52 (H) 12/26/2018 0629   CREATININE 1.22 10/16/2018 1004   CALCIUM 9.9 12/26/2018 0629   GFRNONAA 48 (L) 12/26/2018 0629   GFRNONAA 63 10/16/2018 1004   GFRAA 56 (L) 12/26/2018 0629   GFRAA 73 10/16/2018 1004     Assessment/Plan: 1 Day Post-Op   Active Problems:   S/P shoulder replacement, left   Advance diet Dc home today Sling for comfort NWB to LUE   [name] 12/27/2018, 8:37 AM   Geralynn Rile, MD [phone]

## 2018-12-27 NOTE — Progress Notes (Signed)
AVS given and reviewed with pt. Medications discussed. Sling remains in place. All questions answered to satisfaction. Pt verbalized understanding of information given. Pt escorted off the unit via wheelchair by staff member.

## 2018-12-27 NOTE — Discharge Summary (Signed)
Patient ID: Geoffrey West MRN: [id number] DOB/AGE: 03/04/1956 63 y.o.  Admit date: 12/26/2018 Discharge date: 12/27/2018  Primary Diagnosis: Left prosthetic shoulder loosening  Admission Diagnoses:  Past Medical History:  Diagnosis Date  . Anxiety   . Arthritis    "all over" (03/06/2018)  . GERD (gastroesophageal reflux disease)   . Gout    "on daily RX" (03/06/2018)  . History of kidney stones     passed x 1  . Hypertension   . Hypertension 12/25/2016  . Loosening of total shoulder replacement (HCC)    left  . Low testosterone 12/25/2016  . Migraine    "haven't had more than 1/year since propanolol started" (03/06/2018)  . 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 Situational depression    "recently lost my mom and brother" (03/06/2018)  . Wears contact lenses   . Wears glasses    Discharge Diagnoses:   Active Problems:   S/P shoulder replacement, left  Estimated body mass index is 30.25 kg/m as calculated from the following:   Height as of this encounter: 5\' 11"  (1.803 m).   Weight as of this encounter: 98.4 kg.  Procedure:  Procedure(s) (LRB): Left prosthetic shoulder 1 component revision (Left)   Consults: None  HPI: Geoffrey West had an unfortunate dis-engagement of his left hemiarthroplasty humeral head.  He was indicated for revision of that  Component.  He presented to the hospital electively for the above surgery. Laboratory Data: Admission on 12/26/2018, Discharged on 12/27/2018  Component Date Value Ref Range Status  . WBC 12/26/2018 6.7  4.0 - 10.5 K/uL Final  . RBC 12/26/2018 5.10  4.22 - 5.81 MIL/uL Final  . Hemoglobin 12/26/2018 15.2  13.0 - 17.0 g/dL Final  . HCT 12/26/2018 46.4  39.0 - 52.0 % Final  . MCV 12/26/2018 91.0  80.0 - 100.0 fL Final  . MCH 12/26/2018 29.8  26.0 - 34.0 pg Final  . MCHC 12/26/2018 32.8  30.0 - 36.0 g/dL Final  . RDW 12/26/2018 13.6  11.5 - 15.5 % Final  . Platelets 12/26/2018 205  150 - 400 K/uL Final  . nRBC 12/26/2018 0.0   0.0 - 0.2 % Final   Performed at [facility], [place] [address] [place], [place] [address]  . Sodium 12/26/2018 139  135 - 145 mmol/L Final  . Potassium 12/26/2018 3.6  3.5 - 5.1 mmol/L Final  . Chloride 12/26/2018 102  98 - 111 mmol/L Final  . CO2 12/26/2018 27  22 - 32 mmol/L Final  . Glucose, Bld 12/26/2018 101* 70 - 99 mg/dL Final  . BUN 12/26/2018 21  8 - 23 mg/dL Final  . Creatinine, Ser 12/26/2018 1.52* 0.61 - 1.24 mg/dL Final  . Calcium 12/26/2018 9.9  8.9 - 10.3 mg/dL Final  . GFR calc non Af Amer 12/26/2018 48* >60 mL/min Final  . GFR calc Af Amer 12/26/2018 56* >60 mL/min Final  . Anion gap 12/26/2018 10  5 - 15 Final   Performed at [facility], [place] [address] [place], [place] [address]  Hospital Outpatient Visit on 12/24/2018  Component Date Value Ref Range Status  . SARS Coronavirus 2 12/24/2018 NEGATIVE  NEGATIVE Final   Comment: (NOTE) SARS-CoV-2 target nucleic acids are NOT DETECTED. The SARS-CoV-2 RNA is generally detectable in upper and lower respiratory specimens during the acute phase of infection. Negative results do not preclude SARS-CoV-2 infection, do not rule out co-infections with other pathogens, and should not be used as the sole  basis for treatment or other patient management decisions. Negative results must be combined with clinical observations, patient history, and epidemiological information. The expected result is Negative. Fact Sheet for Patients: SugarRoll.be Fact Sheet for Healthcare Providers: https://www.woods-mathews.com/ This test is not yet approved or cleared by the [place] FDA and  has been authorized for detection and/or diagnosis of SARS-CoV-2 by FDA under an Emergency Use Authorization (EUA). This EUA will remain  in effect (meaning this test can be used) for the duration of the COVID-19 declaration under Section 56                          4(b)(1) of the Act, 21 U.S.C.  section 360bbb-3(b)(1), unless the authorization is terminated or revoked sooner. Performed at [facility], [place] [address] [place], [place] [address]   Admission on 12/05/2018, Discharged on 12/06/2018  Component Date Value Ref Range Status  . Specimen Description 12/05/2018 WOUND   Final  . Special Requests 12/05/2018 JOINT   Final  . Gram Stain 12/05/2018    Final                   Value:NO WBC SEEN NO ORGANISMS SEEN   . Culture 12/05/2018    Final                   Value:No growth aerobically or anaerobically. Performed at [facility], [place] [address], [place], [place] [address]   . Report Status 12/05/2018 12/10/2018 FINAL   Final  Hospital Outpatient Visit on 12/03/2018  Component Date Value Ref Range Status  . SARS-CoV-2, NAA 12/03/2018 NOT DETECTED  NOT DETECTED Final   Comment: (NOTE) This test was developed and its performance characteristics determined by [employer]. This test has not been FDA cleared or approved. This test has been authorized by FDA under an Emergency Use Authorization (EUA). This test is only authorized for the duration of time the declaration that circumstances exist justifying the authorization of the emergency use of in vitro diagnostic tests for detection of SARS-CoV-2 virus and/or diagnosis of COVID-19 infection under section 564(b)(1) of the Act, 21 U.S.C. 829FAO-1(H)(0), unless the authorization is terminated or revoked sooner. When diagnostic testing is negative, the possibility of a false negative result should be considered in the context of a patient's recent exposures and the presence of clinical signs and symptoms consistent with COVID-19. An individual without symptoms of COVID-19 and who is not shedding SARS-CoV-2 virus would expect to have a negative (not detected) result in this assay. Performed                           At: [facility] [place], [place] [id number] Rush Farmer MD  EX:[id number]   . Coronavirus Source 12/03/2018 NASOPHARYNGEAL   Final   Performed at [facility], [place] [address] [place], [place] [address]  Hospital Outpatient Visit on 12/03/2018  Component Date Value Ref Range Status  . MRSA, PCR 12/03/2018 NEGATIVE  NEGATIVE Final  . Staphylococcus aureus 12/03/2018 NEGATIVE  NEGATIVE Final   Comment: (NOTE) The Xpert SA Assay (FDA approved for NASAL specimens in patients 64 years of age and older), is one component of a comprehensive surveillance program. It is not intended to diagnose infection nor to guide or monitor treatment. Performed at [facility], [place] [address] [place],  [address]   .  Sodium 12/03/2018 138  135 - 145 mmol/L Final  . Potassium 12/03/2018 3.6  3.5 - 5.1 mmol/L Final  . Chloride 12/03/2018 102  98 - 111 mmol/L Final  . CO2 12/03/2018 26  22 - 32 mmol/L Final  . Glucose, Bld 12/03/2018 109* 70 - 99 mg/dL Final  . BUN 12/03/2018 16  8 - 23 mg/dL Final  . Creatinine, Ser 12/03/2018 1.11  0.61 - 1.24 mg/dL Final  . Calcium 12/03/2018 9.7  8.9 - 10.3 mg/dL Final  . GFR calc non Af Amer 12/03/2018 >60  >60 mL/min Final  . GFR calc Af Amer 12/03/2018 >60  >60 mL/min Final  . Anion gap 12/03/2018 10  5 - 15 Final   Performed at [facility], [place] [address] [place], [place] [address]  . WBC 12/03/2018 5.2  4.0 - 10.5 K/uL Final  . RBC 12/03/2018 5.06  4.22 - 5.81 MIL/uL Final  . Hemoglobin 12/03/2018 15.3  13.0 - 17.0 g/dL Final  . HCT 12/03/2018 46.3  39.0 - 52.0 % Final  . MCV 12/03/2018 91.5  80.0 - 100.0 fL Final  . MCH 12/03/2018 30.2  26.0 - 34.0 pg Final  . MCHC 12/03/2018 33.0  30.0 - 36.0 g/dL Final  . RDW 12/03/2018 13.9  11.5 - 15.5 % Final  . Platelets 12/03/2018 190  150 - 400 K/uL Final  . nRBC 12/03/2018 0.0  0.0 - 0.2 % Final   Performed at [facility], [place] [address], [place], [place] [address]     X-Rays:Dg Shoulder Left Port  Result Date: 12/26/2018 CLINICAL DATA:   Revision of left shoulder hemiarthroplasty. EXAM: LEFT SHOULDER - 1 VIEW COMPARISON:  12/05/2018 FINDINGS: The single view demonstrates that the proximal humeral component appears in good position in this projection. Screw from previous glenoid component is noted. IMPRESSION: Satisfactory appearance of the left shoulder after hemiarthroplasty revision. Electronically Signed   By: Lorriane Shire M.D.   On: 12/26/2018 09:28   Dg Shoulder Left Port  Result Date: 12/05/2018 CLINICAL DATA:  Reverse total left shoulder arthroplasty. EXAM: LEFT SHOULDER - 1 VIEW COMPARISON:  07/16/2018 and 06/11/2018 FINDINGS: Evidence of patient's left shoulder arthroplasty intact and normally located. No acute fracture or dislocation. IMPRESSION: Left shoulder arthroplasty intact. Electronically Signed   By: Marin Olp M.D.   On: 12/05/2018 09:42    EKG: Orders placed or performed during the hospital encounter of 12/31/17  . EKG 12-Lead  . EKG 12-Lead     Hospital Course: Geoffrey West is a 63 y.o. who was admitted to Hospital. They were brought to the operating room on 12/26/2018 and underwent Procedure(s): Left prosthetic shoulder 1 component revision.  Patient tolerated the procedure well and was later transferred to the recovery room and then to the orthopaedic floor for postoperative care.  They were given PO and IV analgesics for pain control following their surgery.  They were given 24 hours of postoperative antibiotics of  Anti-infectives (From admission, onward)   Start     Dose/Rate Route Frequency Ordered Stop   12/27/18 1000  cephALEXin (KEFLEX) capsule 1,000 mg  Status:  Discontinued     1,000 mg Oral 2 times daily 12/26/18 1019 12/27/18 1345   12/26/18 1400  ceFAZolin (ANCEF) IVPB 2g/100 mL premix     2 g 200 mL/hr over 30 Minutes Intravenous Every 6 hours 12/26/18 1019 12/27/18 0230   12/26/18 0818  vancomycin (VANCOCIN) powder  Status:  Discontinued  As needed 12/26/18 0819 12/26/18 0851    12/26/18 0600  ceFAZolin (ANCEF) IVPB 2g/100 mL premix     2 g 200 mL/hr over 30 Minutes Intravenous On call to O.R. 12/26/18 2706 12/26/18 0745     and started on DVT prophylaxis in the form of Aspirin.  By day One, the patient had progressed with therapy and meeting their goals.  Incision was healing well.  Patient was seen in rounds and was ready to go home.   Diet: Regular diet Activity:NWB Follow-up:in 2 weeks Disposition - Home Discharged Condition: good   Discharge Instructions    Call MD / Call 911   Complete by: As directed    If you experience chest pain or shortness of breath, CALL 911 and be transported to the hospital emergency room.  If you develope a fever above 101 F, pus (white drainage) or increased drainage or redness at the wound, or calf pain, call your surgeon's office.   Constipation Prevention   Complete by: As directed    Drink plenty of fluids.  Prune juice may be helpful.  You may use a stool softener, such as Colace (over the counter) 100 mg twice a day.  Use MiraLax (over the counter) for constipation as needed.   Diet - low sodium heart healthy   Complete by: As directed    Increase activity slowly as tolerated   Complete by: As directed      Allergies as of 12/27/2018      Reactions   Fentanyl Shortness Of Breath, Other (See Comments)   Reaction to [facility] ONLY > ? DOSE REGULATION ?   Oxycodone Other (See Comments)   Pt states he goes through hot and cold flashes withdrawal like symptoms      Medication List    TAKE these medications   allopurinol 300 MG tablet Commonly known as: ZYLOPRIM Take 300 mg by mouth daily.   amLODipine 10 MG tablet Commonly known as: NORVASC Take 10 mg by mouth at bedtime.   anastrozole 1 MG tablet Commonly known as: ARIMIDEX Take 0.5 mg by mouth every Wednesday.   celecoxib 200 MG capsule Commonly known as: CELEBREX Take 200 mg by mouth 2 (two) times daily.   cephALEXin 500 MG capsule Commonly known as:  KEFLEX Take 1 capsule (500 mg total) by mouth 4 (four) times daily. What changed:   how much to take  when to take this   clonazePAM 1 MG tablet Commonly known as: KLONOPIN Take 1 mg by mouth daily as needed for anxiety.   CoQ10 200 MG Caps Take 200 mg by mouth daily.   fluticasone 50 MCG/ACT nasal spray Commonly known as: FLONASE Place 2 sprays into both nostrils daily as needed for allergies or rhinitis.   multivitamin with minerals Tabs tablet Take 1 tablet by mouth daily.   pantoprazole 40 MG tablet Commonly known as: PROTONIX Take 40 mg by mouth every morning.   PARoxetine 20 MG tablet Commonly known as: PAXIL Take 20 mg by mouth every morning.   propranolol ER 80 MG 24 hr capsule Commonly known as: INDERAL LA Take 80 mg by mouth every evening.   rizatriptan 10 MG tablet Commonly known as: MAXALT Take 10 mg by mouth as needed for migraine.   testosterone cypionate 200 MG/ML injection Commonly known as: DEPOTESTOSTERONE CYPIONATE Inject 100 mg into the muscle every Wednesday.      Follow-up Information    [name], MD In 2 weeks.   Specialty:  Orthopedic Surgery Why: For suture removal, For wound re-check Contact information: [address] [place] [address] [place] [address] [phone]           Signed: Geralynn Rile, MD Orthopaedic Surgery 12/27/2018, 3:14 PM

## 2018-12-27 NOTE — Plan of Care (Signed)

## 2018-12-30 ENCOUNTER — Encounter (HOSPITAL_COMMUNITY): Payer: Self-pay | Admitting: Orthopedic Surgery

## 2019-01-09 DIAGNOSIS — Z4789 Encounter for other orthopedic aftercare: Secondary | ICD-10-CM | POA: Diagnosis not present

## 2019-02-06 DIAGNOSIS — Z4789 Encounter for other orthopedic aftercare: Secondary | ICD-10-CM | POA: Diagnosis not present

## 2019-02-17 ENCOUNTER — Other Ambulatory Visit: Payer: PPO

## 2019-02-17 ENCOUNTER — Encounter: Payer: Self-pay | Admitting: Infectious Disease

## 2019-02-17 ENCOUNTER — Other Ambulatory Visit: Payer: Self-pay

## 2019-02-17 ENCOUNTER — Ambulatory Visit (INDEPENDENT_AMBULATORY_CARE_PROVIDER_SITE_OTHER): Payer: PPO | Admitting: Infectious Disease

## 2019-02-17 DIAGNOSIS — D751 Secondary polycythemia: Secondary | ICD-10-CM | POA: Diagnosis not present

## 2019-02-17 DIAGNOSIS — T8459XD Infection and inflammatory reaction due to other internal joint prosthesis, subsequent encounter: Secondary | ICD-10-CM

## 2019-02-17 DIAGNOSIS — M2550 Pain in unspecified joint: Secondary | ICD-10-CM | POA: Diagnosis not present

## 2019-02-17 DIAGNOSIS — Z9889 Other specified postprocedural states: Secondary | ICD-10-CM | POA: Diagnosis not present

## 2019-02-17 DIAGNOSIS — Z96619 Presence of unspecified artificial shoulder joint: Secondary | ICD-10-CM

## 2019-02-17 DIAGNOSIS — E291 Testicular hypofunction: Secondary | ICD-10-CM | POA: Diagnosis not present

## 2019-02-17 DIAGNOSIS — R5383 Other fatigue: Secondary | ICD-10-CM | POA: Diagnosis not present

## 2019-02-17 NOTE — Progress Notes (Signed)
Virtual Visit via Telephone Note  I connected with Geoffrey West on 02/17/19 at  9:30 AM EDT by telephone and verified that I am speaking with the correct person using two identifiers.  Location: Patient: Home Provider: My home   I discussed the limitations, risks, security and privacy concerns of performing an evaluation and management service by telephone and the availability of in person appointments. I also discussed with the patient that there may be a patient responsible charge related to this service. The patient expressed understanding and agreed to proceed.   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 He was taken to OR by Dr. [name] on June 15th of 2018 and had shoulder reviisino and reversal of total shoulder arthroplasty. 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   I am for the first time in December 25, 2016 he actually was having no pain whatsoever.   We worked him into clinic urgently after phone call from Dr. [name] arrange for placement of a PICC line.  However he did not like the PICC line and we ended up changing him to oral antibiotics in the form of   We worked to place  IV ceftriaxone 2grams daily to cover the P acnes and the Coag Neg Staph plus rifampin 300 mg BID  Zyvox and rifampin but had trouble tolerating this regimen as well in particular the rifampin which we then discontinued and ultimately transitioned him to Augmentin. I planned him  to have 6 months of therapy and emphasized that we should see him again in January 2019 which would be the six-month mark of treatment of his prosthetic joint infection.  I saw him in January 2019 and his inflammatory markers were reassuring and he was not having pain.  Unfortunately  after this he was admitted in the summertime after he had developed severe pain and was found on CT scan to have glenoid component screws that had loosened with a lucency between the inferior portion of the glenoid component and just glenoid bone with large amounts of fluid in the subacromial and subdeltoid bursa.  To the operating room on July 9 and underwent explantation of all components of his left reverse shoulder arthroplasty with insertion of antibiotic spacer in the left shoulder with open reduction internal internal fixation of a left proximal humerus fracture.  [name] did mention his operative note that there was problems with the superior and anterior peripheral locking screws which are fractured.  He said they were not able to retrieve the tip of the screw as it was deep in the glenoid vault.  Cultures were sent and the patient then did grow Propionibacterium on all 4 specimens were sent.  Patient was treated with ceftriaxone as an inpatient also had gotten vancomycin.  He was ultimately discharged on oral Keflex 500 mg 4 times daily because he did not want have a PICC line again.  Unfortunately not gain control of infection with the surgery and antibiotics and he had to go back to the operating room yet again timbre underwent she underwent explantation of his humeral antibiotic spacer, bone grafting of the left glenoid fossa bone defect excisional debridement of bone muscle subcutaneous tissue and placement of a left antibiotic spacer.  Ulcers done in September on antibiotics failed to yield an organism.  He ultimately was taken back to the operating room in December still on oral Keflex per our recommendations and underwent plan of his humeral antibiotic spacer and reversal of his total shoulder revision.  He was doing well postoperatively and had hardly any pain and minimal need for any pain meds.  He states he only took a few doses of tramadol.  However in the last week he is developed severe  pain in his left shoulder.  He wondereed if it could have been due to the fact that he is now going through physical therapy and rehab and he also had a massage where his shoulder was down.  He stated he had pain there all the time but is worse when he moves the shoulder and he rated it as being dull deep but also sharp and can be up to 8 or 9 out of 10 in severity.  He  was trying to manage this with gabapentin and nonsteroidal along with tramadol but he was not achieving adequate pain control.  We checked his inflammatory markers his sed rate had climbed to 46 and a CRP had gone to the 9.8 I was very concerned about this as well as Dr. [name].  He was ultimately taken to the operating room January 21 and underwent excisional and non-excisional debridement of infected left shoulder prosthesis including skin subcutaneous tissue muscle and fascia.  A reverse total shoulder revision was performed with polyethylene and glenosphere head ball of 2 components.  Was taken the operating room failed to grow any organisms anaerobically and are being still held for 2 weeks.  There is no growth at 7 days.  I doubt we will grow an organism given he was on antibiotics previously and the media is not designed to pick up nontuberculous mycobacteria nocardia or nocardia terribly well, should be microorganisms I would be thinking would take that long to grow potentially.  Her pain had improved since being in the hospital.  He was seen by my partner Dr. [name] who changed the patient from IV vancomycin to 2 g of ceftriaxone daily.  The patient as mentioned had previously been unwilling to try IV antibiotics at this time is been tolerating him.  His PICC line was  clean dry and intact.  As mentioned his pain is improved relative to when he was in the hospital he is proceeding with some physical therapy.  I told him that he would need to be on some oral antibiotics chronically after completing his antibiotics.  In the  interval he had worsening of his shoulder pain again and in early February was having 8 out of 10 pain.  He was a postop day #23.  He also had increased swelling and erythema at the area and was seen by [name] NP.  Add rifampin and then saw him again on 19 February where he clinically had improved and his sed rate and CRP also came down in the interim.  He continued to feel better and says he feels like his pain is much much better his range of motion is continuing to improve.  Some abdominal discomfort with rifampin but was managing to tolerate it he wears contacts but has not noticed staining of his contacts yet.    He completed his IV antibiotics and was changed to Keflex and rifampin but did not tolerate the rifampin well anymore and is on Keflex alone at 500 mg 4 times daily-->keflex 1 gram BID when I saw him in April.  Since then he has had 2 shoulder surgeries on January 04, 2019 he underwent a left reverse shoulder revision of one component and explantation of the glenosphere and placement of a CTA head, along with excisional debridement of left shoulder including pseudocapsule bone muscle and skin  No organisms grew from intraoperative culture on June 11.  He then went back to the operating room on July 2 and underwent revision of his left shoulder hemiarthroplasty with revision of the humeral head.  There were seromas encountered intraoperatively but Dr. [name] did not feel that any of this looked purulent.  He has maintained on Keflex 1 g twice daily since then.  He says his shoulder feels different than before after all the surgeries but that his pain is improved relative the last time I saw him.  Note the worst pain he ever had the most significant he had was in December 2019.  Many of the other times he has had declaration of infection in his shoulder is not had a whole lot in the way of pain.  Inflammatory markers were reassuring in April he saw them.  I have  scheduled him to get labs done today this morning so we can recheck a sed rate and CRP.  Have also scheduled him follow-up on visit in a few weeks.      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  We will check inflammatory markers today and have an appointment with him over the phone in a few weeks time we can discuss his labs and consider whether or not to have a trial of him being off antibiotics.  Follow Up Instructions:    I discussed the assessment and treatment plan with the patient. The patient was provided an opportunity to ask questions and all were answered. The patient agreed with the plan and demonstrated an understanding of the instructions.   The patient was advised to call back or seek an in-person evaluation if the symptoms worsen or if the condition fails to improve as anticipated.  I provided 21 minutes of non-face-to-face time during this encounter.   Alcide Evener, MD

## 2019-02-18 LAB — CBC WITH DIFFERENTIAL/PLATELET
Absolute Monocytes: 460 cells/uL (ref 200–950)
Basophils Absolute: 41 cells/uL (ref 0–200)
Basophils Relative: 0.9 %
Eosinophils Absolute: 359 cells/uL (ref 15–500)
Eosinophils Relative: 7.8 %
HCT: 48.6 % (ref 38.5–50.0)
Hemoglobin: 15.9 g/dL (ref 13.2–17.1)
Lymphs Abs: 994 cells/uL (ref 850–3900)
MCH: 29.2 pg (ref 27.0–33.0)
MCHC: 32.7 g/dL (ref 32.0–36.0)
MCV: 89.3 fL (ref 80.0–100.0)
MPV: 12.8 fL — ABNORMAL HIGH (ref 7.5–12.5)
Monocytes Relative: 10 %
Neutro Abs: 2746 cells/uL (ref 1500–7800)
Neutrophils Relative %: 59.7 %
Platelets: 192 10*3/uL (ref 140–400)
RBC: 5.44 10*6/uL (ref 4.20–5.80)
RDW: 14.4 % (ref 11.0–15.0)
Total Lymphocyte: 21.6 %
WBC: 4.6 10*3/uL (ref 3.8–10.8)

## 2019-02-18 LAB — BASIC METABOLIC PANEL WITH GFR
BUN/Creatinine Ratio: 16 (calc) (ref 6–22)
BUN: 23 mg/dL (ref 7–25)
CO2: 29 mmol/L (ref 20–32)
Calcium: 10.2 mg/dL (ref 8.6–10.3)
Chloride: 102 mmol/L (ref 98–110)
Creat: 1.43 mg/dL — ABNORMAL HIGH (ref 0.70–1.25)
GFR, Est African American: 60 mL/min/{1.73_m2} (ref >=60)
GFR, Est Non African American: 52 mL/min/{1.73_m2} — ABNORMAL LOW (ref >=60)
Glucose, Bld: 116 mg/dL — ABNORMAL HIGH (ref 65–99)
Potassium: 3.9 mmol/L (ref 3.5–5.3)
Sodium: 141 mmol/L (ref 135–146)

## 2019-02-18 LAB — C-REACTIVE PROTEIN: CRP: 1.3 mg/L (ref <8.0)

## 2019-02-18 LAB — SEDIMENTATION RATE: Sed Rate: 2 mm/h (ref 0–20)

## 2019-02-19 DIAGNOSIS — M2550 Pain in unspecified joint: Secondary | ICD-10-CM | POA: Diagnosis not present

## 2019-02-19 DIAGNOSIS — G479 Sleep disorder, unspecified: Secondary | ICD-10-CM | POA: Diagnosis not present

## 2019-02-19 DIAGNOSIS — E291 Testicular hypofunction: Secondary | ICD-10-CM | POA: Diagnosis not present

## 2019-02-21 ENCOUNTER — Telehealth: Payer: Self-pay | Admitting: *Deleted

## 2019-02-21 NOTE — Telephone Encounter (Signed)
Patient called to get results of he recent labs. Advised they are resulted and look good but will have to ask provider to review and some one will give him a call back.

## 2019-02-23 NOTE — Telephone Encounter (Signed)
They do look very re-assuring. Does he want to trial off of antibiotics at this point?

## 2019-03-04 NOTE — Telephone Encounter (Signed)
Called patient to see if he wants to try coming off antibiotics and had to leave a message for him to call the office when he can.

## 2019-03-25 ENCOUNTER — Other Ambulatory Visit: Payer: Self-pay

## 2019-03-25 ENCOUNTER — Ambulatory Visit (INDEPENDENT_AMBULATORY_CARE_PROVIDER_SITE_OTHER): Payer: PPO | Admitting: Infectious Disease

## 2019-03-25 ENCOUNTER — Encounter: Payer: Self-pay | Admitting: Infectious Disease

## 2019-03-25 DIAGNOSIS — Z96612 Presence of left artificial shoulder joint: Secondary | ICD-10-CM

## 2019-03-25 DIAGNOSIS — Z96619 Presence of unspecified artificial shoulder joint: Secondary | ICD-10-CM | POA: Diagnosis not present

## 2019-03-25 DIAGNOSIS — T8459XD Infection and inflammatory reaction due to other internal joint prosthesis, subsequent encounter: Secondary | ICD-10-CM

## 2019-03-25 NOTE — Progress Notes (Signed)
Virtual Visit via Telephone Note  I connected with Geoffrey West on 03/25/19 at [place] by telephone and verified that I am speaking with the correct person using two identifiers.  Location: Patient: Home Provider: My home   I discussed the limitations, risks, security and privacy concerns of performing an evaluation and management service by telephone and the availability of in person appointments. I also discussed with the patient that there may be a patient responsible charge related to this service. The patient expressed understanding and agreed to proceed.   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 He was taken to OR by Dr. [name] on June 15th of 2018 and had shoulder reviisino and reversal of total shoulder arthroplasty. 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   I am for the first time in December 25, 2016 he actually was having no pain whatsoever.   We worked him into clinic urgently after phone call from Dr. [name] arrange for placement of a PICC line.  However he did not like the PICC line and we ended up changing him to oral antibiotics in the form of   We worked to place  IV ceftriaxone 2grams daily to cover the P acnes and the Coag Neg Staph plus rifampin 300 mg BID  Zyvox and rifampin but had trouble tolerating this regimen as well in particular the rifampin which we then discontinued and ultimately transitioned him to Augmentin. I planned him  to have 6 months of therapy and emphasized that we should see him again in January 2019 which would be the six-month mark of treatment of his prosthetic joint infection.  I saw him in January 2019 and his inflammatory markers were reassuring and he was not having pain.  Unfortunately after this  he was admitted in the summertime after he had developed severe pain and was found on CT scan to have glenoid component screws that had loosened with a lucency between the inferior portion of the glenoid component and just glenoid bone with large amounts of fluid in the subacromial and subdeltoid bursa.  To the operating room on January 01 2018 and underwent explantation of all components of his left reverse shoulder arthroplasty with insertion of antibiotic spacer in the left shoulder with open reduction internal internal fixation of a left proximal humerus fracture.  [name] did mention his operative note that there was problems with the superior and anterior peripheral locking screws which are fractured.  He said they were not able to retrieve the tip of the screw as it was deep in the glenoid vault.  Cultures were sent and the patient then did grow Propionibacterium on all 4 specimens were sent.  Patient was treated with ceftriaxone as an inpatient also had gotten vancomycin.  He was ultimately discharged on oral Keflex 500 mg 4 times daily because he did not want have a PICC line again.  Unfortunately not gain control of infection with the surgery and antibiotics and he had to go back to the operating room yet again timbre underwent she underwent explantation of his humeral antibiotic spacer, bone grafting of the left glenoid fossa bone defect excisional debridement of bone muscle subcutaneous tissue and placement of a left antibiotic spacer.  Cultures done in September on antibiotics failed to yield an organism.  He ultimately  was taken back to the operating room in December 2019  still on oral Keflex per our recommendations and underwent plan of his humeral antibiotic spacer and reversal of his total shoulder revision.   We checked his inflammatory markers in January of 2020 and  his sed rate had climbed to 46 and a CRP had gone to the 9.8 I was very concerned about this as well as Dr. [name].  He was  ultimately taken to the operating room January 21 and underwent excisional and non-excisional debridement of infected left shoulder prosthesis including skin subcutaneous tissue muscle and fascia.  A reverse total shoulder revision was performed with polyethylene and glenosphere head ball of 2 components.  Was taken the operating room failed to grow any organisms anaerobically and are being still held for 2 weeks.    He was seen by my partner Dr. [name] who changed the patient from IV vancomycin to 2 g of ceftriaxone daily.  The patient as mentioned had previously been unwilling to try IV antibiotics at this time is been tolerating him.    In the interval he had worsening of his shoulder pain again and in early February 2020  was having 8 out of 10 pain.  He was a postop day #23.  He also had increased swelling and erythema at the area and was seen by [name] NP.  Add rifampin and then saw him again on 19 February where he clinically had improved and his sed rate and CRP also came down in the interim.  He continued to feel better and says he feels like his pain is much much better his range of motion is continuing to improve.     He completed his IV antibiotics and was changed to Keflex and rifampin but did not tolerate the rifampin well anymore and is on Keflex alone at 500 mg 4 times daily-->keflex 1 gram BID when I saw him in [place] of 2020.   Since then he has had 2 shoulder surgeries on January 04, 2019 he underwent a left reverse shoulder revision of one component and explantation of the glenosphere and placement of a CTA head, along with excisional debridement of left shoulder including pseudocapsule bone muscle and skin  No organisms grew from intraoperative culture on June 11.  He then went back to the operating room on July 2 and underwent revision of his left shoulder hemiarthroplasty with revision of the humeral head.  There were seromas encountered intraoperatively but Dr.  [name] did not feel that any of this looked purulent.  He has maintained on Keflex 1 g twice daily since then.   Inflammatory markers were reassuring in April he saw him then again in August 2020.  Pain is rarely there only when he "overexerts himself.  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 The patient was provided an opportunity to ask questions and all were answered. The patient agreed with the plan and demonstrated an understanding of the instructions.  The patient was advised to call back or seek an in-person evaluation if the symptoms worsen or if the condition fails to improve as anticipated.  I provided 12 minutes of non-face-to-face time during this encounter.   Alcide Evener, MD

## 2019-04-07 DIAGNOSIS — E291 Testicular hypofunction: Secondary | ICD-10-CM | POA: Diagnosis not present

## 2019-04-07 DIAGNOSIS — G479 Sleep disorder, unspecified: Secondary | ICD-10-CM | POA: Diagnosis not present

## 2019-04-07 DIAGNOSIS — R5383 Other fatigue: Secondary | ICD-10-CM | POA: Diagnosis not present

## 2019-04-09 DIAGNOSIS — G479 Sleep disorder, unspecified: Secondary | ICD-10-CM | POA: Diagnosis not present

## 2019-04-09 DIAGNOSIS — M2550 Pain in unspecified joint: Secondary | ICD-10-CM | POA: Diagnosis not present

## 2019-04-09 DIAGNOSIS — R5383 Other fatigue: Secondary | ICD-10-CM | POA: Diagnosis not present

## 2019-04-09 DIAGNOSIS — E291 Testicular hypofunction: Secondary | ICD-10-CM | POA: Diagnosis not present

## 2019-04-15 DIAGNOSIS — H9203 Otalgia, bilateral: Secondary | ICD-10-CM | POA: Diagnosis not present

## 2019-04-15 DIAGNOSIS — Z23 Encounter for immunization: Secondary | ICD-10-CM | POA: Diagnosis not present

## 2019-04-15 DIAGNOSIS — F418 Other specified anxiety disorders: Secondary | ICD-10-CM | POA: Diagnosis not present

## 2019-05-09 DIAGNOSIS — Z471 Aftercare following joint replacement surgery: Secondary | ICD-10-CM | POA: Diagnosis not present

## 2019-05-09 DIAGNOSIS — Z96612 Presence of left artificial shoulder joint: Secondary | ICD-10-CM | POA: Diagnosis not present

## 2019-05-21 ENCOUNTER — Other Ambulatory Visit: Payer: Self-pay

## 2019-05-21 ENCOUNTER — Encounter: Payer: Self-pay | Admitting: Infectious Disease

## 2019-05-21 ENCOUNTER — Ambulatory Visit (INDEPENDENT_AMBULATORY_CARE_PROVIDER_SITE_OTHER): Payer: PPO | Admitting: Infectious Disease

## 2019-05-21 VITALS — BP 141/89 | HR 67 | Wt 218.0 lb

## 2019-05-21 DIAGNOSIS — T8459XD Infection and inflammatory reaction due to other internal joint prosthesis, subsequent encounter: Secondary | ICD-10-CM | POA: Diagnosis not present

## 2019-05-21 DIAGNOSIS — Z96619 Presence of unspecified artificial shoulder joint: Secondary | ICD-10-CM | POA: Diagnosis not present

## 2019-05-21 DIAGNOSIS — Z9889 Other specified postprocedural states: Secondary | ICD-10-CM | POA: Diagnosis not present

## 2019-05-21 DIAGNOSIS — Z96612 Presence of left artificial shoulder joint: Secondary | ICD-10-CM | POA: Diagnosis not present

## 2019-05-21 NOTE — Progress Notes (Signed)
Subjective:   Chief complaint: Mild shoulder pain worse though when he stresses it for example trying to try to play golf which he no longer can or if he is lifting heavy objects.  Hardly any pain at rest   Patient ID: Geoffrey West, male    DOB: 09/25/1955, 63 y.o.   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 He was taken to OR by Dr. Stann West on June 15th of 2018 and had shoulder reviisino and reversal of total shoulder arthroplasty. 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   I am for the first time in December 25, 2016 he actually was having no pain whatsoever.   We worked him into clinic urgently after phone call from Dr. Stann West arrange for placement of a PICC line. However he did not like the PICC line and we ended up changing him to oral antibiotics in the form of   We worked to place IV ceftriaxone 2grams daily to cover the P acnes and the Coag Neg Staph plus rifampin 300 mg BID Zyvox and rifampin but had trouble tolerating this regimen as well in particular the rifampin which we then discontinued and ultimately transitioned him to Augmentin. I planned him to have 6 months of therapy and emphasized that we should see him again in January 2019 which would be the six-month mark of treatment of his prosthetic joint infection.  I saw him in January 2019 and his inflammatory markers were reassuring and he was not having pain.  Unfortunately after this he was admitted in the summertime after he had developed severe pain and was found on CT scan to have glenoid component screws that had loosened with a lucency between the inferior portion of the glenoid component and just glenoid bone with large amounts of fluid in the subacromial and subdeltoid  bursa.  To the operating room on January 01 2018 and underwent explantation of all components of his left reverse shoulder arthroplasty with insertion of antibiotic spacer in the left shoulder with open reduction internal internal fixation of a left proximal humerus fracture. Geoffrey West did mention his operative note that there was problems with the superior and anterior peripheral locking screws which are fractured. He said they were not able to retrieve the tip of the screw as it was deep in the glenoid vault. Cultures were sent and the patient then did grow Propionibacterium on all 4 specimens were sent. Patient was treated with ceftriaxone as an inpatient also had gotten vancomycin. He was ultimately discharged on oral Keflex 500 mg 4 times daily because he did not want have a PICC line again. Unfortunately not gain control of infection with the surgery and antibiotics and he had to go back to the operating room yet again timbre underwent she underwent explantation of his humeral antibiotic spacer, bone grafting of the left glenoid fossa bone defect excisional debridement of bone muscle subcutaneous tissue and placement of a left antibiotic spacer. Cultures done in September on antibiotics failed to yield an organism. He ultimately was taken back to the operating room in December 2019  still on oral Keflex per our recommendations and underwent plan of his humeral antibiotic spacer and reversal of his total shoulder revision.   We checked his inflammatory markers in January of 2020 and  his sed rate  had climbed to 46 and a CRP had gone to the 9.8 I was very concerned about this as well as Dr. Stann West. He was ultimately taken to the operating room January 21 and underwent excisional and non-excisional debridement of infected left shoulder prosthesis including skin subcutaneous tissue muscle and fascia. A reverse total shoulder revision was performed with polyethylene and glenosphere head ball of 2  components.  Was taken the operating room failed to grow any organisms anaerobically and are being still held for 2 weeks.   He was seen by my partner Dr. Linus West who changed the patient from IV vancomycin to 2 g of ceftriaxone daily. The patient as mentioned had previously been unwilling to try IV antibiotics at this time is been tolerating him.   In the interval he had worsening of his shoulder pain again and in early February 2020  was having 8 out of 10 pain. He was a postop day #23. He also had increased swelling and erythema at the area and was seen by Geoffrey Dingwall NP. Add rifampin and then saw him again on 19 February where he clinically had improved and his sed rate and CRP also came down in the interim.  He continuedto feel better and says he feels like his pain is much much better his range of motion is continuing to improve.    He completed his IV antibiotics and was changed to Keflex and rifampin but did not tolerate the rifampin well anymore and is on Keflex alone at 500 mg 4 times daily-->keflex 1 gram BID when I saw him in [place] of 2020.   Since then he has had 2 shoulder surgeries on January 04, 2019 he underwent a left reverse shoulder revision of one component and explantation of the glenosphere and placement of a CTA head, along with excisional debridement of left shoulder including pseudocapsule bone muscle and skin  No organisms grew from intraoperative culture on June 11.  He then went back to the operating room on July 2 and underwent revision of his left shoulder hemiarthroplasty with revision of the humeral head.  There were seromas encountered intraoperatively but Dr. Stann West did not feel that any of this looked purulent.  He had maintained on Keflex 1 g twice daily since then.   Inflammatory markers were reassuring in April he saw him then again in August 2020.  Pain is rarely there only when he "overexerts himself.  He was eager to come off  antibiotics I think this is reasonable though given his history will have to be very careful monitoring him.  Is now 2 months out since we have stopped his antibiotics.  Pain has not worsened.  He largely has pain if he stresses the shoulder by carrying heavy objects or trying to even play golf is something that he cannot do.  He does not have pain at rest.   Past Medical History:  Diagnosis Date   Anxiety    Arthritis    "all over" (03/06/2018)   GERD (gastroesophageal reflux disease)    Gout    "on daily RX" (03/06/2018)   History of kidney stones     passed x 1   Hypertension    Hypertension 12/25/2016   Loosening of total shoulder replacement (HCC)    left   Low testosterone 12/25/2016   Migraine    "haven't had more than 1/year since propanolol started" (03/06/2018)   Prosthetic shoulder infection ([place]) 12/25/2016   Situational depression    "recently lost  my mom and brother" (03/06/2018)   Wears contact lenses    Wears glasses     Past Surgical History:  Procedure Laterality Date   BACK SURGERY     COLONOSCOPY     COLONOSCOPY W/ BIOPSIES AND POLYPECTOMY     EXCISIONAL TOTAL SHOULDER ARTHROPLASTY WITH ANTIBIOTIC SPACER Left 03/06/2018   Procedure: Left shoulder antibiotic spacer expalnt with glenoid bone grafting, antiobiotic spacer placement;  Surgeon: [name], MD;  Location: [facility];  Service: Orthopedics;  Laterality: Left;   IR FLUORO GUIDE CV LINE RIGHT  12/26/2016   KNEE ARTHROSCOPY Bilateral    X 10 total arthroscopies   KNEE CARTILAGE SURGERY Left 1977   "cartilage removed"   LASIK Bilateral    LUMBAR DISC SURGERY  2000s   "ruptured disc"   REVERSE SHOULDER ARTHROPLASTY Left 12/08/2016   Procedure: Revision left total shoulder to reverse total shoulder arthroplasty;  Surgeon: [name], MD;  Location: [facility];  Service: Orthopedics;  Laterality: Left;   REVERSE SHOULDER ARTHROPLASTY Left 01/01/2018   Procedure: Left reverse  shouler explant with antibiotic spacer placement;  Surgeon: [name], MD;  Location: [facility];  Service: Orthopedics;  Laterality: Left;  2.5 hrs   REVERSE SHOULDER ARTHROPLASTY Left 06/11/2018   Procedure: REVERSE TOTAL SHOULDER REVISION;  Surgeon: [name], MD;  Location: [facility];  Service: Orthopedics;  Laterality: Left;   REVERSE SHOULDER ARTHROPLASTY Left 12/05/2018   Procedure: Left reverse shoulder explant, bone grafting;  Surgeon: [name], MD;  Location: [facility];  Service: Orthopedics;  Laterality: Left;  2.5 hrs   REVISION OF TOTAL SHOULDER Left 12/26/2018   REVISION TOTAL SHOULDER TO REVERSE TOTAL SHOULDER Left 07/16/2018   Procedure: Left shoulder irrigation and debridement with poly and head ball exchange;  Surgeon: [name], MD;  Location: [facility];  Service: Orthopedics;  Laterality: Left;  2 hrs   SHOULDER ARTHROSCOPY Right 1980s/1990s   "bone spur removed"   SHOULDER OPEN ROTATOR CUFF REPAIR Right 1970s   SHOULDER SURGERY Left 03/06/2018   antibiotic spacer expalnt with glenoid bone grafting, antiobiotic spacer placement   TOTAL KNEE ARTHROPLASTY Bilateral    TOTAL SHOULDER ARTHROPLASTY Left ~ 2014-2017 X 2   "in [place]"   [place] Right ~ 2009   "in [place]"   [place] Left 12/26/2018   Procedure: Left prosthetic shoulder 1 component revision;  Surgeon: [name], MD;  Location: [facility];  Service: Orthopedics;  Laterality: Left;  90 mins   WISDOM TOOTH EXTRACTION      Family History  Problem Relation Age of Onset   Arthritis Mother    Arthritis Sister    Alzheimer's disease Brother    Arthritis Brother       Social History   Socioeconomic History   Marital status: Single    Spouse name: Not on file   Number of children: Not on file   Years of education: Not on file   Highest education level: Not on file  Occupational History   Not on file  Social  Needs   Financial resource strain: Not on file   Food insecurity    Worry: Not on file    Inability: Not on file   Transportation needs    Medical: Not on file    Non-medical: Not on file  Tobacco Use   Smoking status: Never Smoker   Smokeless tobacco: Never Used  Substance and Sexual Activity   Alcohol use:  Not Currently   Drug use: Never   Sexual activity: Not Currently  Lifestyle   Physical activity    Days per week: Not on file    Minutes per session: Not on file   Stress: Not on file  Relationships   Social connections    Talks on phone: Not on file    Gets together: Not on file    Attends religious service: Not on file    Active member of club or organization: Not on file    Attends meetings of clubs or organizations: Not on file    Relationship status: Not on file  Other Topics Concern   Not on file  Social History Narrative   Not on file    Allergies  Allergen Reactions   Fentanyl Shortness Of Breath and Other (See Comments)    Reaction to [facility] ONLY > ? DOSE REGULATION ?    Oxycodone Other (See Comments)    Pt states he goes through hot and cold flashes withdrawal like symptoms     Current Outpatient Medications:    allopurinol (ZYLOPRIM) 300 MG tablet, Take 300 mg by mouth daily., Disp: , Rfl:    amLODipine (NORVASC) 10 MG tablet, Take 10 mg by mouth at bedtime. , Disp: , Rfl:    anastrozole (ARIMIDEX) 1 MG tablet, Take 0.5 mg by mouth every Wednesday. , Disp: , Rfl:    celecoxib (CELEBREX) 200 MG capsule, Take 200 mg by mouth 2 (two) times daily., Disp: , Rfl: 3   cephALEXin (KEFLEX) 500 MG capsule, Take 1 capsule (500 mg total) by mouth 4 (four) times daily. (Patient taking differently: Take 1,000 mg by mouth 2 (two) times daily. ), Disp: 120 capsule, Rfl: 11   clonazePAM (KLONOPIN) 1 MG tablet, Take 1 mg by mouth daily as needed for anxiety., Disp: , Rfl:    Coenzyme Q10 (COQ10) 200 MG CAPS, Take 200 mg by mouth daily. , Disp: ,  Rfl:    fluticasone (FLONASE) 50 MCG/ACT nasal spray, Place 2 sprays into both nostrils daily as needed for allergies or rhinitis., Disp: , Rfl:    Multiple Vitamin (MULTIVITAMIN WITH MINERALS) TABS tablet, Take 1 tablet by mouth daily., Disp: , Rfl:    pantoprazole (PROTONIX) 40 MG tablet, Take 40 mg by mouth every morning. , Disp: , Rfl:    PARoxetine (PAXIL) 20 MG tablet, Take 20 mg by mouth every morning., Disp: , Rfl:    propranolol ER (INDERAL LA) 80 MG 24 hr capsule, Take 80 mg by mouth every evening. , Disp: , Rfl: 1   rizatriptan (MAXALT) 10 MG tablet, Take 10 mg by mouth as needed for migraine. , Disp: , Rfl:    testosterone cypionate (DEPOTESTOSTERONE CYPIONATE) 200 MG/ML injection, Inject 100 mg into the muscle every Wednesday. , Disp: , Rfl: 5    Review of Systems  Constitutional: Negative for activity change, appetite change, chills, diaphoresis, fatigue, fever and unexpected weight change.  HENT: Negative for congestion, rhinorrhea, sinus pressure, sneezing, sore throat and trouble swallowing.   Eyes: Negative for photophobia and visual disturbance.  Respiratory: Negative for cough, chest tightness, shortness of breath, wheezing and stridor.   Cardiovascular: Negative for chest pain, palpitations and leg swelling.  Gastrointestinal: Negative for abdominal distention, abdominal pain, anal bleeding, blood in stool, constipation, diarrhea, nausea and vomiting.  Genitourinary: Negative for difficulty urinating, dysuria, flank pain and hematuria.  Musculoskeletal: Negative for arthralgias, back pain, gait problem, joint swelling and myalgias.  Skin: Negative for color  change, pallor, rash and wound.  Neurological: Negative for dizziness, tremors, weakness and light-headedness.  Hematological: Negative for adenopathy. Does not bruise/bleed easily.  Psychiatric/Behavioral: Negative for agitation, behavioral problems, confusion, decreased concentration, dysphoric mood and sleep  disturbance.       Objective:   Physical Exam Constitutional:      General: He is not in acute distress.    Appearance: He is not diaphoretic.  HENT:     Head: Normocephalic and atraumatic.     Right Ear: External ear normal.     Left Ear: External ear normal.     Nose: Nose normal.     Mouth/Throat:     Pharynx: No oropharyngeal exudate.  Eyes:     General: No scleral icterus.    Conjunctiva/sclera: Conjunctivae normal.     Pupils: Pupils are equal, round, and reactive to light.  Neck:     Musculoskeletal: Normal range of motion and neck supple.  Cardiovascular:     Rate and Rhythm: Normal rate and regular rhythm.     Heart sounds: Normal heart sounds. No murmur. No friction rub. No gallop.   Pulmonary:     Effort: Pulmonary effort is normal. No respiratory distress.     Breath sounds: Normal breath sounds. No wheezing or rales.  Abdominal:     General: Bowel sounds are normal. There is no distension.     Palpations: Abdomen is soft.     Tenderness: There is no abdominal tenderness. There is no rebound.  Musculoskeletal: Normal range of motion.        General: No tenderness.  Lymphadenopathy:     Cervical: No cervical adenopathy.  Skin:    General: Skin is warm and dry.     Coloration: Skin is not pale.     Findings: No erythema or rash.  Neurological:     Mental Status: He is alert and oriented to person, place, and time.     Coordination: Coordination normal.  Psychiatric:        Judgment: Judgment normal.           Assessment & Plan:   Prosthetic shoulder infection:  We will recheck his inflammatory markers today with a CBC and BMP.  He is scheduled to have repeat CT scan of the shoulder in January prior to consideration of further surgery with Dr. Stann West.  Will schedule appointment with [place] in mid January to check on him at that time  See flu shot from his primary care physician with equal

## 2019-05-22 LAB — CBC WITH DIFFERENTIAL/PLATELET
Absolute Monocytes: 636 cells/uL (ref 200–950)
Basophils Absolute: 72 cells/uL (ref 0–200)
Basophils Relative: 1.2 %
Eosinophils Absolute: 342 cells/uL (ref 15–500)
Eosinophils Relative: 5.7 %
HCT: 49.5 % (ref 38.5–50.0)
Hemoglobin: 16.6 g/dL (ref 13.2–17.1)
Lymphs Abs: 1620 cells/uL (ref 850–3900)
MCH: 29.7 pg (ref 27.0–33.0)
MCHC: 33.5 g/dL (ref 32.0–36.0)
MCV: 88.7 fL (ref 80.0–100.0)
MPV: 11.8 fL (ref 7.5–12.5)
Monocytes Relative: 10.6 %
Neutro Abs: 3330 cells/uL (ref 1500–7800)
Neutrophils Relative %: 55.5 %
Platelets: 198 10*3/uL (ref 140–400)
RBC: 5.58 10*6/uL (ref 4.20–5.80)
RDW: 14.9 % (ref 11.0–15.0)
Total Lymphocyte: 27 %
WBC: 6 10*3/uL (ref 3.8–10.8)

## 2019-05-22 LAB — BASIC METABOLIC PANEL WITH GFR
BUN/Creatinine Ratio: 15 (calc) (ref 6–22)
BUN: 20 mg/dL (ref 7–25)
CO2: 30 mmol/L (ref 20–32)
Calcium: 9.8 mg/dL (ref 8.6–10.3)
Chloride: 102 mmol/L (ref 98–110)
Creat: 1.33 mg/dL — ABNORMAL HIGH (ref 0.70–1.25)
GFR, Est African American: 66 mL/min/{1.73_m2} (ref >=60)
GFR, Est Non African American: 57 mL/min/{1.73_m2} — ABNORMAL LOW (ref >=60)
Glucose, Bld: 96 mg/dL (ref 65–99)
Potassium: 3.9 mmol/L (ref 3.5–5.3)
Sodium: 141 mmol/L (ref 135–146)

## 2019-05-22 LAB — SEDIMENTATION RATE: Sed Rate: 6 mm/h (ref 0–20)

## 2019-05-22 LAB — C-REACTIVE PROTEIN: CRP: 4.4 mg/L (ref <8.0)

## 2019-05-27 ENCOUNTER — Telehealth: Payer: Self-pay

## 2019-05-27 NOTE — Telephone Encounter (Signed)
Patient called and made aware of lab results.  Geoffrey West

## 2019-05-27 NOTE — Telephone Encounter (Signed)
-----   Message from Truman Hayward, MD sent at 05/27/2019 10:54 AM EST ----- Inflammatory markers reassuring

## 2019-06-10 DIAGNOSIS — L700 Acne vulgaris: Secondary | ICD-10-CM | POA: Diagnosis not present

## 2019-06-10 DIAGNOSIS — L718 Other rosacea: Secondary | ICD-10-CM | POA: Diagnosis not present

## 2019-06-10 DIAGNOSIS — L905 Scar conditions and fibrosis of skin: Secondary | ICD-10-CM | POA: Diagnosis not present

## 2019-06-10 DIAGNOSIS — L821 Other seborrheic keratosis: Secondary | ICD-10-CM | POA: Diagnosis not present

## 2019-07-07 ENCOUNTER — Telehealth: Payer: Self-pay

## 2019-07-07 DIAGNOSIS — E039 Hypothyroidism, unspecified: Secondary | ICD-10-CM | POA: Diagnosis not present

## 2019-07-07 DIAGNOSIS — R5383 Other fatigue: Secondary | ICD-10-CM | POA: Diagnosis not present

## 2019-07-07 DIAGNOSIS — G479 Sleep disorder, unspecified: Secondary | ICD-10-CM | POA: Diagnosis not present

## 2019-07-07 DIAGNOSIS — M2550 Pain in unspecified joint: Secondary | ICD-10-CM | POA: Diagnosis not present

## 2019-07-07 DIAGNOSIS — E291 Testicular hypofunction: Secondary | ICD-10-CM | POA: Diagnosis not present

## 2019-07-07 IMAGING — DX LEFT SHOULDER - 1 VIEW
1 series · 1 of 1 positions shown · non-contrast
Comparison: 12/05/2018

CLINICAL DATA: Revision of left shoulder hemiarthroplasty.

EXAM:
LEFT SHOULDER - 1 VIEW

[shoulder ap]
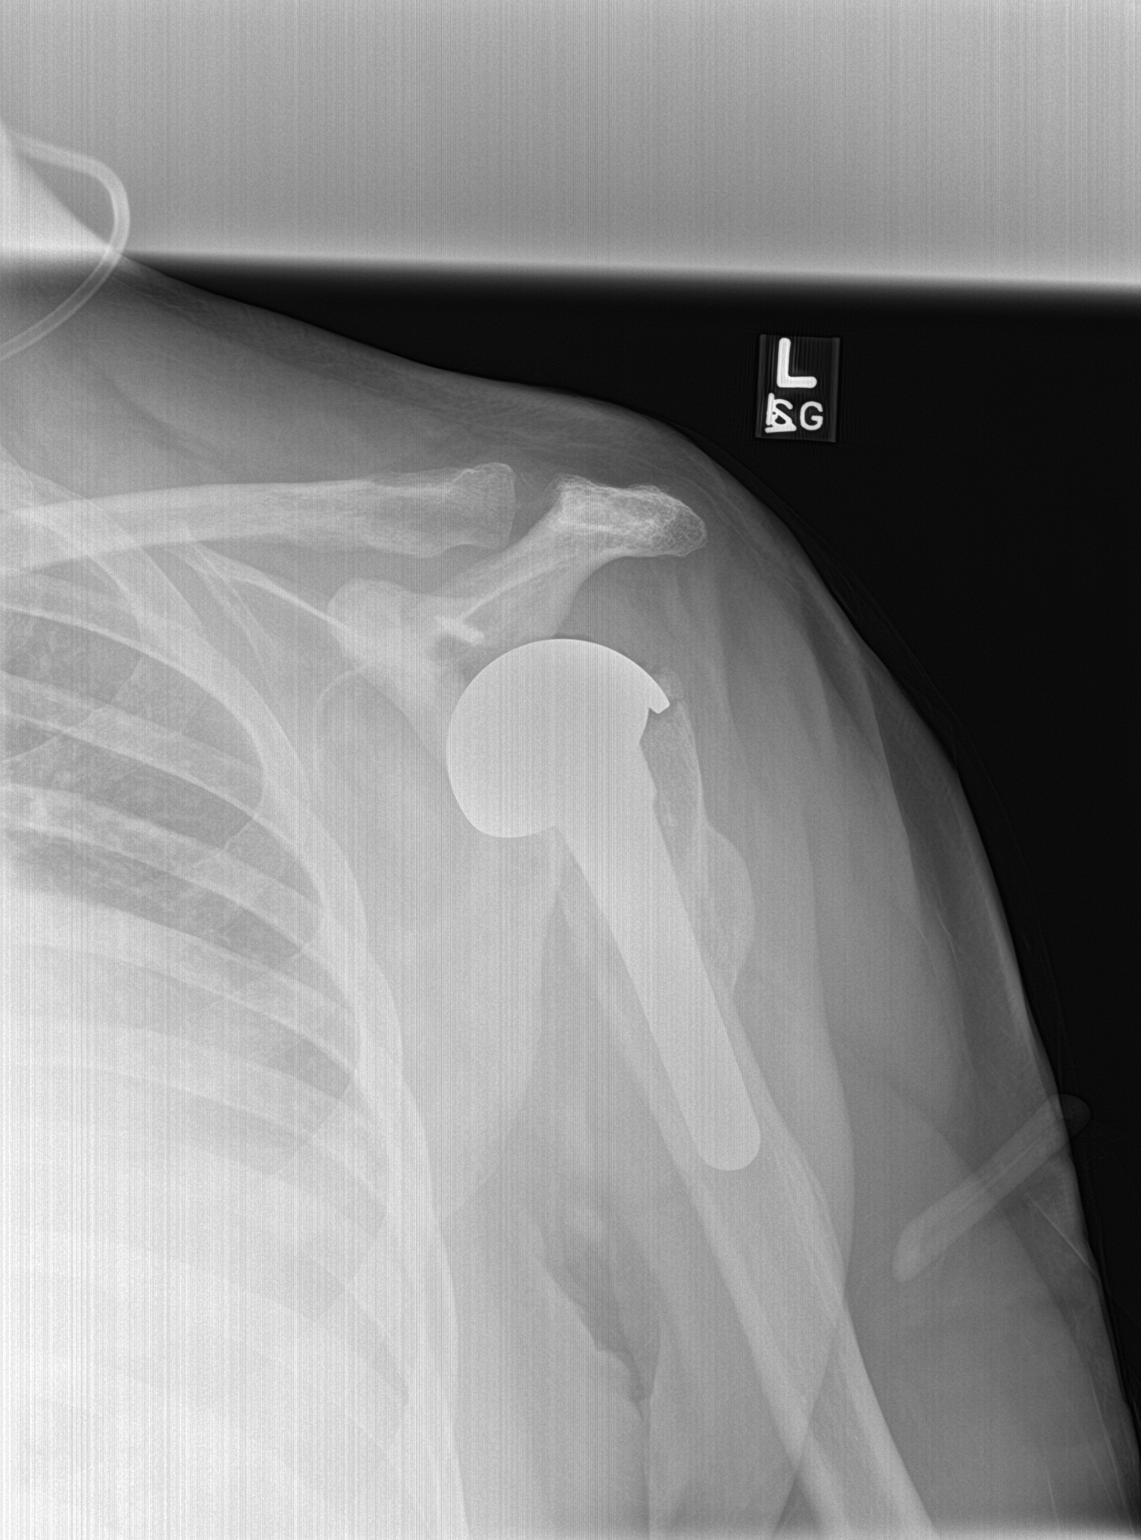

[1 of 1 positions shown; findings below may reference images not displayed]

FINDINGS: The single view demonstrates that the proximal humeral component
appears in good position in this projection.

Screw from previous glenoid component is noted.
IMPRESSION: Satisfactory appearance of the left shoulder after hemiarthroplasty
revision.

## 2019-07-07 NOTE — Telephone Encounter (Signed)
COVID-19 Pre-Screening Questions:07/07/2019  . Do you currently have a fever (>100 F), chills or unexplained body aches?NO  . Are you currently experiencing new cough, shortness of breath, sore throat, runny nose?NO .  Have you recently travelled outside the state of [place] in the last 14 days?NO  .  Have you been in contact with someone that is currently pending confirmation of Covid19 testing or has been confirmed to have the [place] virus? NO  **If the patient answers NO to ALL questions -  advise the patient to please call the clinic before coming to the office should any symptoms develop.

## 2019-07-08 ENCOUNTER — Ambulatory Visit (INDEPENDENT_AMBULATORY_CARE_PROVIDER_SITE_OTHER): Payer: PPO | Admitting: Infectious Disease

## 2019-07-08 ENCOUNTER — Other Ambulatory Visit: Payer: Self-pay

## 2019-07-08 ENCOUNTER — Encounter: Payer: Self-pay | Admitting: Infectious Disease

## 2019-07-08 DIAGNOSIS — Z20822 Contact with and (suspected) exposure to covid-19: Secondary | ICD-10-CM | POA: Diagnosis not present

## 2019-07-08 DIAGNOSIS — Z96619 Presence of unspecified artificial shoulder joint: Secondary | ICD-10-CM

## 2019-07-08 DIAGNOSIS — T8459XD Infection and inflammatory reaction due to other internal joint prosthesis, subsequent encounter: Secondary | ICD-10-CM | POA: Diagnosis not present

## 2019-07-08 DIAGNOSIS — Z96612 Presence of left artificial shoulder joint: Secondary | ICD-10-CM | POA: Diagnosis not present

## 2019-07-08 HISTORY — DX: Contact with and (suspected) exposure to covid-19: Z20.822

## 2019-07-08 NOTE — Progress Notes (Signed)
Virtual Visit via Telephone Note  I connected with Geoffrey West on 07/08/19 at  9:30 AM EST by telephone and verified that I am speaking with the correct person using two identifiers.  Location: Patient: Home Provider: [facility]   I discussed the limitations, risks, security and privacy concerns of performing an evaluation and management service by telephone and the availability of in person appointments. I also discussed with the patient that there may be a patient responsible charge related to this service. The patient expressed understanding and agreed to proceed.   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 He was taken to OR by Dr. [name] on June 15th of 2018 and had shoulder reviisino and reversal of total shoulder arthroplasty. 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   I am for the first time in December 25, 2016 he actually was having no pain whatsoever.   We worked him into clinic urgently after phone call from Dr. [name] arrange for placement of a PICC line. However he did not like the PICC line and we ended up changing him to oral antibiotics in the form of   We worked to place IV ceftriaxone 2grams daily to cover the P acnes and the Coag Neg Staph plus rifampin 300 mg BID Zyvox and rifampin but had trouble tolerating this regimen as well in particular the rifampin which we then discontinued and ultimately transitioned him to Augmentin. I planned him to have 6 months of therapy and emphasized that we should see him again in January 2019 which would be the six-month mark of treatment of his prosthetic joint infection.  I saw him in January 2019 and his inflammatory markers were  reassuring and he was not having pain.  Unfortunately after this he was admitted in the summertime after he had developed severe pain and was found on CT scan to have glenoid component screws that had loosened with a lucency between the inferior portion of the glenoid component and just glenoid bone with large amounts of fluid in the subacromial and subdeltoid bursa.  To the operating room on July 92019and underwent explantation of all components of his left reverse shoulder arthroplasty with insertion of antibiotic spacer in the left shoulder with open reduction internal internal fixation of a left proximal humerus fracture. [name] did mention his operative note that there was problems with the superior and anterior peripheral locking screws which are fractured. He said they were not able to retrieve the tip of the screw as it was deep in the glenoid vault. Cultures were sent and the patient then did grow Propionibacterium on all 4 specimens were sent. Patient was treated with ceftriaxone as an inpatient also had gotten vancomycin. He was ultimately discharged on oral Keflex 500 mg 4 times daily because he did not want have a PICC line again. Unfortunately not gain control of infection with the surgery and antibiotics and he had to go back to the operating room yet again timbre underwent she underwent explantation of his humeral antibiotic spacer, bone grafting of the left glenoid fossa bone defect excisional debridement of bone muscle subcutaneous tissue and placement of a left antibiotic spacer.Culturesdone in September on antibiotics failed to yield an organism. He ultimately was taken back to  the operating room in [place] on oral Keflex per our recommendations and underwent plan of his humeral antibiotic spacer and reversal of his total shoulder revision.   We checked his inflammatory markersin January of 2020 andhis sed rate had climbed to 46 and a CRP had gone to the 9.8  I was very concerned about this as well as Dr. [name]. He was ultimately taken to the operating room January 21 and underwent excisional and non-excisional debridement of infected left shoulder prosthesis including skin subcutaneous tissue muscle and fascia. A reverse total shoulder revision was performed with polyethylene and glenosphere head ball of 2 components.  Was taken the operating room failed to grow any organisms anaerobically and are being still held for 2 weeks.   He was seen by my partner Dr. [name] who changed the patient from IV vancomycin to 2 g of ceftriaxone daily. The patient as mentioned had previously been unwilling to try IV antibiotics at this time is been tolerating him.   In the interval he had worsening of his shoulder pain again and in early February2020was having 8 out of 10 pain. He was a postop day #23. He also had increased swelling and erythema at the area and was seen by [name] NP. Add rifampin and then saw him again on 19 February where he clinically had improved and his sed rate and CRP also came down in the interim.  He continuedto feel better and says he feels like his pain is much much better his range of motion is continuing to improve.   He completed his IV antibiotics and was changed to Keflex and rifampin but did not tolerate the rifampin well anymore and is on Keflex alone at 500 mg 4 times daily-->keflex 1 gram BID when I saw him in [place].   Since then he has had 2 shoulder surgeries on January 04, 2019 he underwent a left reverse shoulder revision of one component and explantation of the glenosphere and placement of a CTA head, along with excisional debridement of left shoulder including pseudocapsule bone muscle and skin  No organisms grew from intraoperative culture on June 11.  He then went back to the operating room on July 2 and underwent revision of his left shoulder hemiarthroplasty with revision of the humeral  head. There were seromas encountered intraoperatively but Dr. [name] did not feel that any of this looked purulent.  He had maintained on Keflex 1 g twice daily since then.   Inflammatory markers were reassuring in April he sawhimthen again in August 2020.  Pain is rarely there only when he "overexerts himself.  He was eager to come off antibiotics he did indeed come off antibiotics in September has been off since I saw him in November he was doing relatively well then.  Pain was largely occurring with stressors of weight lifting heavy objects which he tries not to do very often or trying to play golf.  He is scheduled to have repeat CT scan of his shoulder sometime in the next few weeks though he does not have a specific date yet.  His plan with his surgeon is to eventually go back for surgery in mid February.  He converted his in person visit to an E visit today because he was just yesterday with a friend of has who has Alzheimer's type dementia and he was dying of coronavirus 2019.  He was wearing a mask while he was with her but spent over 6 hours with her.  12 point review of systems as in HPI but otherwise negative  Past Medical History:  Diagnosis Date  . Anxiety   . Arthritis    "all over" (03/06/2018)  . Close exposure to COVID-19 virus 07/08/2019  . GERD (gastroesophageal reflux disease)   . Gout    "on daily RX" (03/06/2018)  . History of kidney stones     passed x 1  . Hypertension   . Hypertension 12/25/2016  . Loosening of total shoulder replacement (HCC)    left  . Low testosterone 12/25/2016  . Migraine    "haven't had more than 1/year since propanolol started" (03/06/2018)  . 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 Situational depression    "recently lost my mom and brother" (03/06/2018)  . Wears contact lenses   . Wears glasses     Past Surgical History:  Procedure Laterality Date  . BACK SURGERY    . COLONOSCOPY    . COLONOSCOPY W/ BIOPSIES AND  POLYPECTOMY    . EXCISIONAL TOTAL SHOULDER ARTHROPLASTY WITH ANTIBIOTIC SPACER Left 03/06/2018   Procedure: Left shoulder antibiotic spacer expalnt with glenoid bone grafting, antiobiotic spacer placement;  Surgeon: [name], MD;  Location: [facility];  Service: Orthopedics;  Laterality: Left;  . IR FLUORO GUIDE CV LINE RIGHT  12/26/2016  . KNEE ARTHROSCOPY Bilateral    X 10 total arthroscopies  . KNEE CARTILAGE SURGERY Left 1977   "cartilage removed"  . LASIK Bilateral   . LUMBAR DISC SURGERY  2000s   "ruptured disc"  . REVERSE SHOULDER ARTHROPLASTY Left 12/08/2016   Procedure: Revision left total shoulder to reverse total shoulder arthroplasty;  Surgeon: [name], MD;  Location: [facility];  Service: Orthopedics;  Laterality: Left;  . REVERSE SHOULDER ARTHROPLASTY Left 01/01/2018   Procedure: Left reverse shouler explant with antibiotic spacer placement;  Surgeon: [name], MD;  Location: [facility];  Service: Orthopedics;  Laterality: Left;  2.5 hrs  . REVERSE SHOULDER ARTHROPLASTY Left 06/11/2018   Procedure: REVERSE TOTAL SHOULDER REVISION;  Surgeon: [name], MD;  Location: [facility];  Service: Orthopedics;  Laterality: Left;  . REVERSE SHOULDER ARTHROPLASTY Left 12/05/2018   Procedure: Left reverse shoulder explant, bone grafting;  Surgeon: [name], MD;  Location: [facility];  Service: Orthopedics;  Laterality: Left;  2.5 hrs  . REVISION OF TOTAL SHOULDER Left 12/26/2018  . REVISION TOTAL SHOULDER TO REVERSE TOTAL SHOULDER Left 07/16/2018   Procedure: Left shoulder irrigation and debridement with poly and head ball exchange;  Surgeon: [name], MD;  Location: [facility];  Service: Orthopedics;  Laterality: Left;  2 hrs  . SHOULDER ARTHROSCOPY Right 1980s/1990s   "bone spur removed"  . SHOULDER OPEN ROTATOR CUFF REPAIR Right 1970s  . SHOULDER SURGERY Left 03/06/2018   antibiotic spacer expalnt with glenoid bone grafting, antiobiotic spacer  placement  . TOTAL KNEE ARTHROPLASTY Bilateral   . TOTAL SHOULDER ARTHROPLASTY Left ~ 2014-2017 X 2   "in [place]"  . TOTAL SHOULDER REPLACEMENT Right ~ 2009   "in [place]"  . TOTAL SHOULDER REVISION Left 12/26/2018   Procedure: Left prosthetic shoulder 1 component revision;  Surgeon: [name], MD;  Location: [facility];  Service: Orthopedics;  Laterality: Left;  90 mins  . WISDOM TOOTH EXTRACTION      Family History  Problem Relation Age of Onset  . Arthritis Mother   . Arthritis Sister   . Alzheimer's disease Brother   . Arthritis Brother  Social History   Socioeconomic History  . Marital status: Single    Spouse name: Not on file  . Number of children: Not on file  . Years of education: Not on file  . Highest education level: Not on file  Occupational History  . Not on file  Tobacco Use  . Smoking status: Never Smoker  . Smokeless tobacco: Never Used  Substance and Sexual Activity  . Alcohol use: Not Currently  . Drug use: Never  . Sexual activity: Not Currently  Other Topics Concern  . Not on file  Social History Narrative  . Not on file   Social Determinants of Health   Financial Resource Strain:   . Difficulty of Paying Living Expenses: Not on file  Food Insecurity:   . Worried About Charity fundraiser in the Last Year: Not on file  . Ran Out of Food in the Last Year: Not on file  Transportation Needs:   . Lack of Transportation (Medical): Not on file  . Lack of Transportation (Non-Medical): Not on file  Physical Activity:   . Days of Exercise per Week: Not on file  . Minutes of Exercise per Session: Not on file  Stress:   . Feeling of Stress : Not on file  Social Connections:   . Frequency of Communication with Friends and Family: Not on file  . Frequency of Social Gatherings with Friends and Family: Not on file  . Attends Religious Services: Not on file  . Active Member of Clubs or Organizations: Not on file  . Attends Theatre manager Meetings: Not on file  . Marital Status: Not on file    Allergies  Allergen Reactions  . Fentanyl Shortness Of Breath and Other (See Comments)    Reaction to [facility] ONLY > ? DOSE REGULATION ?   [name] Oxycodone Other (See Comments)    Pt states he goes through hot and cold flashes withdrawal like symptoms     Current Outpatient Medications:  .  allopurinol (ZYLOPRIM) 300 MG tablet, Take 300 mg by mouth daily., Disp: , Rfl:  .  amLODipine (NORVASC) 10 MG tablet, Take 10 mg by mouth at bedtime. , Disp: , Rfl:  .  anastrozole (ARIMIDEX) 1 MG tablet, Take 0.5 mg by mouth every Wednesday. , Disp: , Rfl:  .  celecoxib (CELEBREX) 200 MG capsule, Take 200 mg by mouth 2 (two) times daily., Disp: , Rfl: 3 .  cephALEXin (KEFLEX) 500 MG capsule, Take 1 capsule (500 mg total) by mouth 4 (four) times daily. (Patient not taking: Reported on 05/21/2019), Disp: 120 capsule, Rfl: 11 .  clonazePAM (KLONOPIN) 1 MG tablet, Take 1 mg by mouth daily as needed for anxiety., Disp: , Rfl:  .  Coenzyme Q10 (COQ10) 200 MG CAPS, Take 200 mg by mouth daily. , Disp: , Rfl:  .  fluticasone (FLONASE) 50 MCG/ACT nasal spray, Place 2 sprays into both nostrils daily as needed for allergies or rhinitis., Disp: , Rfl:  .  Multiple Vitamin (MULTIVITAMIN WITH MINERALS) TABS tablet, Take 1 tablet by mouth daily., Disp: , Rfl:  .  pantoprazole (PROTONIX) 40 MG tablet, Take 40 mg by mouth every morning. , Disp: , Rfl:  .  PARoxetine (PAXIL) 20 MG tablet, Take 20 mg by mouth every morning., Disp: , Rfl:  .  propranolol ER (INDERAL LA) 80 MG 24 hr capsule, Take 80 mg by mouth every evening. , Disp: , Rfl: 1 .  rizatriptan (MAXALT) 10 MG tablet, Take  10 mg by mouth as needed for migraine. , Disp: , Rfl:  .  testosterone cypionate (DEPOTESTOSTERONE CYPIONATE) 200 MG/ML injection, Inject 100 mg into the muscle every Wednesday. 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  Be good idea 7 days and to have a Covid test.  Should he become symptomatic then the clock for self-isolation restarts at that day and would proceed for 10 days.  Should he develop symptoms prior to his appointment on 3 February with [place] for labs he should cancel that appointment.    Follow Up Instructions:    I discussed the assessment and treatment plan with the patient. The patient was provided an opportunity to ask questions and all were answered. The patient agreed with the plan and demonstrated an understanding of the instructions.   The patient was advised to call back or seek an in-person evaluation if the symptoms worsen or if the condition fails to improve as anticipated.  I provided 21 minutes of non-face-to-face time during this encounter.   Alcide Evener, MD

## 2019-07-09 DIAGNOSIS — K219 Gastro-esophageal reflux disease without esophagitis: Secondary | ICD-10-CM | POA: Diagnosis not present

## 2019-07-09 DIAGNOSIS — Z20828 Contact with and (suspected) exposure to other viral communicable diseases: Secondary | ICD-10-CM | POA: Diagnosis not present

## 2019-07-09 DIAGNOSIS — E039 Hypothyroidism, unspecified: Secondary | ICD-10-CM | POA: Diagnosis not present

## 2019-07-09 DIAGNOSIS — G4700 Insomnia, unspecified: Secondary | ICD-10-CM | POA: Diagnosis not present

## 2019-07-09 DIAGNOSIS — M2550 Pain in unspecified joint: Secondary | ICD-10-CM | POA: Diagnosis not present

## 2019-07-09 DIAGNOSIS — E291 Testicular hypofunction: Secondary | ICD-10-CM | POA: Diagnosis not present

## 2019-07-09 DIAGNOSIS — F418 Other specified anxiety disorders: Secondary | ICD-10-CM | POA: Diagnosis not present

## 2019-07-09 DIAGNOSIS — G479 Sleep disorder, unspecified: Secondary | ICD-10-CM | POA: Diagnosis not present

## 2019-07-10 DIAGNOSIS — Z20822 Contact with and (suspected) exposure to covid-19: Secondary | ICD-10-CM | POA: Diagnosis not present

## 2019-07-30 ENCOUNTER — Other Ambulatory Visit: Payer: Self-pay

## 2019-07-30 ENCOUNTER — Other Ambulatory Visit: Payer: PPO

## 2019-07-30 ENCOUNTER — Other Ambulatory Visit: Payer: Self-pay | Admitting: Orthopedic Surgery

## 2019-07-30 DIAGNOSIS — T8459XD Infection and inflammatory reaction due to other internal joint prosthesis, subsequent encounter: Secondary | ICD-10-CM | POA: Diagnosis not present

## 2019-07-30 DIAGNOSIS — Z96619 Presence of unspecified artificial shoulder joint: Secondary | ICD-10-CM | POA: Diagnosis not present

## 2019-07-30 DIAGNOSIS — Z96612 Presence of left artificial shoulder joint: Secondary | ICD-10-CM

## 2019-07-31 ENCOUNTER — Other Ambulatory Visit: Payer: PPO

## 2019-07-31 LAB — C-REACTIVE PROTEIN: CRP: 15.3 mg/L — ABNORMAL HIGH (ref <8.0)

## 2019-07-31 LAB — SEDIMENTATION RATE: Sed Rate: 29 mm/h — ABNORMAL HIGH (ref 0–20)

## 2019-08-01 ENCOUNTER — Ambulatory Visit
Admission: RE | Admit: 2019-08-01 | Discharge: 2019-08-01 | Disposition: A | Discharge status: Home / Self Care | Payer: PPO | Source: Ambulatory Visit | Attending: Orthopedic Surgery | Admitting: Orthopedic Surgery

## 2019-08-01 ENCOUNTER — Telehealth: Payer: Self-pay | Admitting: *Deleted

## 2019-08-01 DIAGNOSIS — M19012 Primary osteoarthritis, left shoulder: Secondary | ICD-10-CM | POA: Diagnosis not present

## 2019-08-01 DIAGNOSIS — Z96612 Presence of left artificial shoulder joint: Secondary | ICD-10-CM

## 2019-08-01 DIAGNOSIS — T8459XD Infection and inflammatory reaction due to other internal joint prosthesis, subsequent encounter: Secondary | ICD-10-CM

## 2019-08-01 DIAGNOSIS — Z96619 Presence of unspecified artificial shoulder joint: Secondary | ICD-10-CM

## 2019-08-01 MED ORDER — CEPHALEXIN 500 MG PO CAPS: 500.0000 mg | ORAL_CAPSULE | Freq: Four times a day (QID) | ORAL | 0 refills | Status: DC

## 2019-08-01 NOTE — Telephone Encounter (Signed)
Patient currently at [place] for [place].  RN left message on voicemail asking patient to restart Keflex per Dr  Tommy Medal, to have him follow up in 1 month. RN sent in refill to [facility] on [address]. Landis Gandy, RN

## 2019-08-01 NOTE — Telephone Encounter (Signed)
-----  Message from Truman Hayward, MD sent at 07/31/2019  6:23 PM EST ----- Marcello Moores  ESR and CRP are up  again. I think It would be wise for him to resume his keflex if he is still having shoulder pain though we can also do a recheck in another month

## 2019-08-04 NOTE — Telephone Encounter (Signed)
Spoke with patient. He is scheduled to follow up with his surgeon this week to determine the next step/surgery date. Will forward labs to Dr Victorino December. [name] is scheduled for labs on 3/4. Please enter the orders you'd like collected.

## 2019-08-04 NOTE — Telephone Encounter (Signed)
I looked at his CT report which his surgeon should see but it doesn't seem too bad. I think he should go back on abx though

## 2019-08-26 ENCOUNTER — Other Ambulatory Visit: Payer: Self-pay

## 2019-08-26 DIAGNOSIS — Z96619 Presence of unspecified artificial shoulder joint: Secondary | ICD-10-CM

## 2019-08-26 DIAGNOSIS — T8459XD Infection and inflammatory reaction due to other internal joint prosthesis, subsequent encounter: Secondary | ICD-10-CM

## 2019-08-28 ENCOUNTER — Other Ambulatory Visit: Payer: Self-pay

## 2019-08-28 ENCOUNTER — Other Ambulatory Visit: Payer: PPO

## 2019-08-28 ENCOUNTER — Encounter: Payer: Self-pay | Admitting: Infectious Disease

## 2019-08-28 ENCOUNTER — Other Ambulatory Visit: Payer: Self-pay | Admitting: Infectious Disease

## 2019-08-28 ENCOUNTER — Telehealth: Payer: Self-pay

## 2019-08-28 ENCOUNTER — Ambulatory Visit (INDEPENDENT_AMBULATORY_CARE_PROVIDER_SITE_OTHER): Payer: PPO | Admitting: Infectious Disease

## 2019-08-28 VITALS — BP 145/90 | HR 74

## 2019-08-28 DIAGNOSIS — Z96612 Presence of left artificial shoulder joint: Secondary | ICD-10-CM

## 2019-08-28 DIAGNOSIS — Z96619 Presence of unspecified artificial shoulder joint: Secondary | ICD-10-CM

## 2019-08-28 DIAGNOSIS — Z20822 Contact with and (suspected) exposure to covid-19: Secondary | ICD-10-CM

## 2019-08-28 DIAGNOSIS — T8459XD Infection and inflammatory reaction due to other internal joint prosthesis, subsequent encounter: Secondary | ICD-10-CM

## 2019-08-28 DIAGNOSIS — A498 Other bacterial infections of unspecified site: Secondary | ICD-10-CM | POA: Diagnosis not present

## 2019-08-28 HISTORY — DX: Other bacterial infections of unspecified site: A49.8

## 2019-08-28 NOTE — Progress Notes (Signed)
Subjective:  Chief complaint is concerned about his shoulder and what the plan is as far as possible surgery   Patient ID: BLY BAE, male    DOB: 1955-06-29, 64 y.o.   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 He was taken to OR by Dr. [name] on June 15th of 2018 and had shoulder reviisino and reversal of total shoulder arthroplasty. 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   I am for the first time in December 25, 2016 he actually was having no pain whatsoever.   We worked him into clinic urgently after phone call from Dr. [name] arrange for placement of a PICC line. However he did not like the PICC line and we ended up changing him to oral antibiotics in the form of   We worked to place IV ceftriaxone 2grams daily to cover the P acnes and the Coag Neg Staph plus rifampin 300 mg BID Zyvox and rifampin but had trouble tolerating this regimen as well in particular the rifampin which we then discontinued and ultimately transitioned him to Augmentin. I planned him to have 6 months of therapy and emphasized that we should see him again in January 2019 which would be the six-month mark of treatment of his prosthetic joint infection.  I saw him in January 2019 and his inflammatory markers were reassuring and he was not having pain.  Unfortunately after this he was admitted in the summertime after he had developed severe pain and was found on CT scan to have glenoid component screws that had loosened with a lucency between the inferior portion of the glenoid component and just glenoid bone with large amounts of fluid in the subacromial and subdeltoid bursa.  To the operating room on July 92019and underwent explantation of all components of his left  reverse shoulder arthroplasty with insertion of antibiotic spacer in the left shoulder with open reduction internal internal fixation of a left proximal humerus fracture. [name] did mention his operative note that there was problems with the superior and anterior peripheral locking screws which are fractured. He said they were not able to retrieve the tip of the screw as it was deep in the glenoid vault. Cultures were sent and the patient then did grow Propionibacterium on all 4 specimens were sent. Patient was treated with ceftriaxone as an inpatient also had gotten vancomycin. He was ultimately discharged on oral Keflex 500 mg 4 times daily because he did not want have a PICC line again. Unfortunately not gain control of infection with the surgery and antibiotics and he had to go back to the operating room yet again timbre underwent she underwent explantation of his humeral antibiotic spacer, bone grafting of the left glenoid fossa bone defect excisional debridement of bone muscle subcutaneous tissue and placement of a left antibiotic spacer.Culturesdone in September on antibiotics failed to yield an organism. He ultimately was taken back to the operating room in [place] on oral Keflex per our recommendations and underwent plan of his humeral antibiotic spacer and reversal of his total shoulder revision.   We checked his inflammatory markersin January of 2020 andhis sed rate had climbed to 46 and a CRP had gone to the 9.8 I was very concerned about this as well as Dr. [name]. He was ultimately taken to the operating room  January 21 and underwent excisional and non-excisional debridement of infected left shoulder prosthesis including skin subcutaneous tissue muscle and fascia. A reverse total shoulder revision was performed with polyethylene and glenosphere head ball of 2 components.  Was taken the operating room failed to grow any organisms anaerobically and are being still held  for 2 weeks.   He was seen by my partner Dr. [name] who changed the patient from IV vancomycin to 2 g of ceftriaxone daily. The patient as mentioned had previously been unwilling to try IV antibiotics at this time is been tolerating him.   In the interval he had worsening of his shoulder pain again and in early February2020was having 8 out of 10 pain. He was a postop day #23. He also had increased swelling and erythema at the area and was seen by [name] NP. Add rifampin and then saw him again on 19 February where he clinically had improved and his sed rate and CRP also came down in the interim.  He continuedto feel better and says he feels like his pain is much much better his range of motion is continuing to improve.   He completed his IV antibiotics and was changed to Keflex and rifampin but did not tolerate the rifampin well anymore and is on Keflex alone at 500 mg 4 times daily-->keflex 1 gram BID when I saw him in [place].   Since then he has had 2 shoulder surgeries on January 04, 2019 he underwent a left reverse shoulder revision of one component and explantation of the glenosphere and placement of a CTA head, along with excisional debridement of left shoulder including pseudocapsule bone muscle and skin  No organisms grew from intraoperative culture on June 11.  He then went back to the operating room on July 2 and underwent revision of his left shoulder hemiarthroplasty with revision of the humeral head. There were seromas encountered intraoperatively but Dr. [name] did not feel that any of this looked purulent.  He hadmaintained on Keflex 1 g twice daily since then.   Inflammatory markers were reassuring in April he sawhimthen again in August 2020.  Pain is rarely there only when he "overexerts himself.  [place] to come off antibiotics he did indeed come off antibiotics in September has been off since I saw him in November he was doing  relatively well then.  Pain was largely occurring with stressors of weight lifting heavy objects which he tries not to do very often or trying to play golf.  When I last had a visit with time it was virtually because he had had contact with a Covid positive patient and he was quarantining.  He tells me he tested negative for COVID-19 and did not have symptoms consistent with this.  We did check inflammatory markers as it and both his sed rate and CRP were elevated and so I asked him to resume his Keflex.  He has done so.  He comes to clinic again for repeat labs today.  In the interim he had a CT scan done of his shoulder  Personally reviewed the images myself and agree that it shows thing that is clearly consistent with any acute infection.  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  There is no loosening of any of his shoulder replacement.  His shoulder pain appears relatively stable versus a month ago versus 4 months ago.  Largely painful when he does things to  stress it like lifting weights, or other maneuvers that stress the joint.  Without fevers or systemic symptoms.  He is anxious to find out if he is going to have surgery and when he is going to have surgery.  I told him I would communicate with his orthopedic surgeon Dr. Victorino December and the patient provided me with Dr. [name] cell phone number.  .  Review of Systems  Constitutional: Negative for activity change, appetite change, chills, diaphoresis, fatigue, fever and unexpected weight change.  HENT: Negative for congestion, rhinorrhea, sinus pressure, sneezing, sore throat and trouble swallowing.   Eyes: Negative for photophobia and visual disturbance.  Respiratory: Negative for cough, chest tightness, shortness of breath, wheezing and stridor.   Cardiovascular: Negative for chest pain, palpitations and leg swelling.  Gastrointestinal: Negative for  abdominal distention, abdominal pain, anal bleeding, blood in stool, constipation, diarrhea, nausea and vomiting.  Genitourinary: Negative for difficulty urinating, dysuria, flank pain and hematuria.  Musculoskeletal: Positive for arthralgias. Negative for back pain, gait problem, joint swelling and myalgias.  Skin: Negative for color change, pallor, rash and wound.  Neurological: Negative for dizziness, tremors, weakness and light-headedness.  Hematological: Negative for adenopathy. Does not bruise/bleed easily.  Psychiatric/Behavioral: Negative for agitation, behavioral problems, confusion, decreased concentration, dysphoric mood and sleep disturbance.       Objective:   Physical Exam Constitutional:      General: He is not in acute distress.    Appearance: Normal appearance. He is well-developed. He is not ill-appearing or diaphoretic.  HENT:     Head: Normocephalic and atraumatic.     Right Ear: Hearing and external ear normal.     Left Ear: Hearing and external ear normal.     Nose: No nasal deformity or rhinorrhea.  Eyes:     General: No scleral icterus.    Conjunctiva/sclera: Conjunctivae normal.     Right eye: Right conjunctiva is not injected.     Left eye: Left conjunctiva is not injected.  Neck:     Vascular: No JVD.  Cardiovascular:     Rate and Rhythm: Normal rate and regular rhythm.     Heart sounds: S1 normal and S2 normal.  Pulmonary:     Effort: No respiratory distress.  Abdominal:     General: There is no distension.     Palpations: Abdomen is soft.     Tenderness: There is no abdominal tenderness.  Musculoskeletal:        General: Normal range of motion.     Right shoulder: Normal.     Left shoulder: Normal.     Cervical back: Normal range of motion and neck supple.     Right hip: Normal.     Left hip: Normal.     Right knee: Normal.     Left knee: Normal.  Lymphadenopathy:     Head:     Right side of head: No submandibular, preauricular or posterior  auricular adenopathy.     Left side of head: No submandibular, preauricular or posterior auricular adenopathy.     Cervical: No cervical adenopathy.     Right cervical: No superficial or deep cervical adenopathy.    Left cervical: No superficial or deep cervical adenopathy.  Skin:    General: Skin is warm and dry.     Coloration: Skin is not jaundiced or pale.     Findings: No abrasion, bruising, ecchymosis, erythema, lesion or rash.     Nails: There is no clubbing.  Neurological:  General: No focal deficit present.     Mental Status: He is alert and oriented to person, place, and time.     Sensory: No sensory deficit.     Coordination: Coordination normal.     Gait: Gait normal.  Psychiatric:        Attention and Perception: Attention normal. He is attentive.        Mood and Affect: Affect normal.        Speech: Speech normal.        Behavior: Behavior normal. Behavior is cooperative.        Thought Content: Thought content normal.        Judgment: Judgment normal.           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  Dr. [name] is contemplating surgery, was wanting to reimplant his shoulder if all signs were encouraging.  He was also discouraged by the inflammatory markers but encouraged by the CT findings.  We will discuss various options that we could pursue.  Certainly we could get greater diagnostic certainty if we had cultures from the shoulder joint off antibiotics and that could be 1 avenue we could pursue.  For now I am going to continue the patient on Keflex still has had further discussions with Dr. [name].  I have tentatively scheduled him with me in 2 months time.  I spent greater than 25 minutes with the patient including greater than 50% of time in face to face counsel of the patient the nature of prosthetic joint infection and coordination of his care with Dr. [name] a

## 2019-08-28 NOTE — Telephone Encounter (Signed)
Patient here for repeat labs and requesting to speak with a nurse.  Patient has concerns related to his f/u with Dr Tommy Medal. He was last seen in November 2020 and needs to be consulted regarding shoulder surgery.  Patient was informed we would follow up with a phone call regarding results or information from Dr Tommy Medal and he said he would prefer to speak with the provider only and best if in person.  I spoke with Dr Tommy Medal who has agreed to work patient into his schedule this morning since the patient is here.   Patient added to schedule.   Laverle Patter, RN

## 2019-08-28 NOTE — Progress Notes (Signed)
Patient ID: Geoffrey West, male   DOB: 1955/07/11, 64 y.o.   MRN: [id number]

## 2019-08-29 ENCOUNTER — Telehealth: Payer: Self-pay

## 2019-08-29 LAB — SEDIMENTATION RATE: Sed Rate: 22 mm/h — ABNORMAL HIGH (ref 0–20)

## 2019-08-29 LAB — C-REACTIVE PROTEIN: CRP: 22 mg/L — ABNORMAL HIGH (ref <8.0)

## 2019-08-29 NOTE — Telephone Encounter (Signed)
Patient called office in regards to lab results.Relayed Md's message regarding ESR and CRP being up; as well as plan to have joint aspiration in 1-2 months. Patient would like to speak with MD over the phone to discuss treatment plan and labs in more depth. [place]

## 2019-08-29 NOTE — Telephone Encounter (Signed)
Additional note from Dr Geoffrey West:  Geoffrey West, Geoffrey Islam, MD  Laverle Patter, RN; P [facility] Triage Nurse Pool  I was able to get in touch with his orthopedic surgeon Dr. [name] who can call the patient later today the plan now is to take him off of antibiotics and for Dr. [name] to aspirate the joint roughly a month or 2 after coming off cephalexin to help [place] diagnose with there is still ongoing infection requiring antibiotics and delay in surgery or not.

## 2019-08-29 NOTE — Telephone Encounter (Signed)
-----  Message from Truman Hayward, MD sent at 08/29/2019  2:08 PM EST ----- ESR and CRP up still. Plan is joint aspiration off antibiotics in 1-2 months

## 2019-09-02 DIAGNOSIS — L821 Other seborrheic keratosis: Secondary | ICD-10-CM | POA: Diagnosis not present

## 2019-09-02 DIAGNOSIS — D225 Melanocytic nevi of trunk: Secondary | ICD-10-CM | POA: Diagnosis not present

## 2019-09-02 DIAGNOSIS — D485 Neoplasm of uncertain behavior of skin: Secondary | ICD-10-CM | POA: Diagnosis not present

## 2019-09-02 DIAGNOSIS — L820 Inflamed seborrheic keratosis: Secondary | ICD-10-CM | POA: Diagnosis not present

## 2019-09-03 DIAGNOSIS — Z96612 Presence of left artificial shoulder joint: Secondary | ICD-10-CM | POA: Diagnosis not present

## 2019-09-05 DIAGNOSIS — M2550 Pain in unspecified joint: Secondary | ICD-10-CM | POA: Diagnosis not present

## 2019-09-05 DIAGNOSIS — E291 Testicular hypofunction: Secondary | ICD-10-CM | POA: Diagnosis not present

## 2019-09-05 DIAGNOSIS — G479 Sleep disorder, unspecified: Secondary | ICD-10-CM | POA: Diagnosis not present

## 2019-09-05 DIAGNOSIS — E039 Hypothyroidism, unspecified: Secondary | ICD-10-CM | POA: Diagnosis not present

## 2019-09-08 NOTE — Telephone Encounter (Signed)
I just had him visit with me when he came in for labs. He can schedule yet another appt with me at next available appt or with Colletta [place] whom he is seen in the past. I AM NOT [facility]

## 2019-09-09 DIAGNOSIS — M2550 Pain in unspecified joint: Secondary | ICD-10-CM | POA: Diagnosis not present

## 2019-09-09 DIAGNOSIS — E291 Testicular hypofunction: Secondary | ICD-10-CM | POA: Diagnosis not present

## 2019-09-09 DIAGNOSIS — E039 Hypothyroidism, unspecified: Secondary | ICD-10-CM | POA: Diagnosis not present

## 2019-09-09 DIAGNOSIS — G479 Sleep disorder, unspecified: Secondary | ICD-10-CM | POA: Diagnosis not present

## 2019-09-17 ENCOUNTER — Ambulatory Visit (INDEPENDENT_AMBULATORY_CARE_PROVIDER_SITE_OTHER): Payer: PPO | Admitting: Infectious Disease

## 2019-09-17 ENCOUNTER — Encounter: Payer: Self-pay | Admitting: Infectious Disease

## 2019-09-17 ENCOUNTER — Other Ambulatory Visit: Payer: Self-pay

## 2019-09-17 VITALS — BP 169/99 | HR 70 | Temp 97.9°F | Ht 71.0 in | Wt 220.0 lb

## 2019-09-17 DIAGNOSIS — Z96612 Presence of left artificial shoulder joint: Secondary | ICD-10-CM

## 2019-09-17 DIAGNOSIS — Z96619 Presence of unspecified artificial shoulder joint: Secondary | ICD-10-CM | POA: Diagnosis not present

## 2019-09-17 DIAGNOSIS — R21 Rash and other nonspecific skin eruption: Secondary | ICD-10-CM | POA: Diagnosis not present

## 2019-09-17 DIAGNOSIS — T8459XD Infection and inflammatory reaction due to other internal joint prosthesis, subsequent encounter: Secondary | ICD-10-CM | POA: Diagnosis not present

## 2019-09-17 DIAGNOSIS — A498 Other bacterial infections of unspecified site: Secondary | ICD-10-CM | POA: Diagnosis not present

## 2019-09-17 HISTORY — DX: Rash and other nonspecific skin eruption: R21

## 2019-09-17 NOTE — Progress Notes (Signed)
Subjective:  Chief complaint: Concerned about erythema overlying his shoulder and chest   Patient ID: Geoffrey West, male    DOB: 1956/06/23, 64 y.o.   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 He was taken to OR by Dr. [name] on June 15th of 2018 and had shoulder reviisino and reversal of total shoulder arthroplasty. 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   I am for the first time in December 25, 2016 he actually was having no pain whatsoever.    We worked him into clinic urgently after phone call from Dr. [name] arrange for placement of a PICC line. However he did not like the PICC line and we ended up changing him to oral antibiotics in the form of   We worked to place IV ceftriaxone 2grams daily to cover the P acnes and the Coag Neg Staph plus rifampin 300 mg BID Zyvox and rifampin but had trouble tolerating this regimen as well in particular the rifampin which we then discontinued and ultimately transitioned him to Augmentin. I planned him to have 6 months of therapy and emphasized that we should see him again in January 2019 which would be the six-month mark of treatment of his prosthetic joint infection.  I saw him in January 2019 and his inflammatory markers were reassuring and he was not having pain.  Unfortunately after this he was admitted in the summertime after he had developed severe pain and was found on CT scan to have glenoid component screws that had loosened with a lucency between the inferior portion of the glenoid component and just glenoid bone with large amounts of fluid in the subacromial and subdeltoid bursa.  To the operating room on July 92019and underwent explantation of all components of his left reverse shoulder  arthroplasty with insertion of antibiotic spacer in the left shoulder with open reduction internal internal fixation of a left proximal humerus fracture. [name] did mention his operative note that there was problems with the superior and anterior peripheral locking screws which are fractured. He said they were not able to retrieve the tip of the screw as it was deep in the glenoid vault. Cultures were sent and the patient then did grow Propionibacterium on all 4 specimens were sent. Patient was treated with ceftriaxone as an inpatient also had gotten vancomycin. He was ultimately discharged on oral Keflex 500 mg 4 times daily because he did not want have a PICC line again. Unfortunately not gain control of infection with the surgery and antibiotics and he had to go back to the operating room yet again timbre underwent she underwent explantation of his humeral antibiotic spacer, bone grafting of the left glenoid fossa bone defect excisional debridement of bone muscle subcutaneous tissue and placement of a left antibiotic spacer.Culturesdone in September on antibiotics failed to yield an organism. He ultimately was taken back to the operating room in [place] on oral Keflex per our recommendations and underwent plan of his humeral antibiotic spacer and reversal of his total shoulder revision.   We checked his inflammatory markersin January of 2020 andhis sed rate had climbed to 46 and a CRP had gone to the 9.8 I was very concerned about this as well as Dr. [name]. He was ultimately taken to the operating room January 21 and underwent excisional and  non-excisional debridement of infected left shoulder prosthesis including skin subcutaneous tissue muscle and fascia. A reverse total shoulder revision was performed with polyethylene and glenosphere head ball of 2 components.  Was taken the operating room failed to grow any organisms anaerobically and are being still held for 2 weeks.    He was seen by my partner Dr. [name] who changed the patient from IV vancomycin to 2 g of ceftriaxone daily. The patient as mentioned had previously been unwilling to try IV antibiotics at this time is been tolerating him.   In the interval he had worsening of his shoulder pain again and in early February2020was having 8 out of 10 pain. He was a postop day #23. He also had increased swelling and erythema at the area and was seen by [name] NP. Add rifampin and then saw him again on 19 February where he clinically had improved and his sed rate and CRP also came down in the interim.  He continuedto feel better and says he feels like his pain is much much better his range of motion is continuing to improve.   He completed his IV antibiotics and was changed to Keflex and rifampin but did not tolerate the rifampin well anymore and is on Keflex alone at 500 mg 4 times daily-->keflex 1 gram BID when I saw him in [place].   Since then he has had 2 shoulder surgeries on January 04, 2019 he underwent a left reverse shoulder revision of one component and explantation of the glenosphere and placement of a CTA head, along with excisional debridement of left shoulder including pseudocapsule bone muscle and skin  No organisms grew from intraoperative culture on June 11.  He then went back to the operating room on July 2 and underwent revision of his left shoulder hemiarthroplasty with revision of the humeral head. There were seromas encountered intraoperatively but Dr. [name] did not feel that any of this looked purulent.  He hadmaintained on Keflex 1 g twice daily since then.   Inflammatory markers were reassuring in April he sawhimthen again in August 2020.  Pain is rarely there only when he "overexerts himself.  Hewaseager to come off antibiotics he did indeed come off antibiotics in September has been off since I saw him in November he was doing relatively well  then.  Pain was largely occurring with stressors of weight lifting heavy objects which he tries not to do very often or trying to play golf.  When I last had a visit with time it was virtually because he had had contact with a Covid positive patient and he was quarantining.  He told me  he tested negative for COVID-19 and did not have symptoms consistent with this.  We did check inflammatory markers as it and both his sed rate and CRP were elevated and so I asked him to resume his Keflex.  He has done so.  He comes to clinic again for repeat labs today.  In the interim he had a CT scan done of his shoulder  Personally reviewed the images myself and agree that it shows thing that is clearly consistent with any acute infection.  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  There is no loosening of any of his shoulder replacement.  His shoulder pain appears relatively stable versus a month ago versus 4 months ago.  Largely painful when he does things to stress it like lifting weights,  or other maneuvers that stress the joint.  Without fevers or systemic symptoms.  He is anxious to find out if he is going to have surgery and when he is going to have surgery.  We checked his labs however his inflammatory markers were up yet again.  I talked to Dr. [name] and plan was to keep him off antibiotics and have an aspirate of his shoulder roughly 6 weeks after coming off antibiotics.  Today he came to see me as he wanted to have me look at this area of erythema that involves the shoulder and chest.  He says this has been present for the last 5 to 6 months.  Is not especially tender but he is bothered by it not sure what this means.  Is been present when he was on antibiotics and he is off antibiotics  .  Review of Systems  Constitutional: Negative for activity change, appetite change, chills, diaphoresis, fatigue, fever and  unexpected weight change.  HENT: Negative for congestion, rhinorrhea, sinus pressure, sneezing, sore throat and trouble swallowing.   Eyes: Negative for photophobia and visual disturbance.  Respiratory: Negative for cough, chest tightness, shortness of breath, wheezing and stridor.   Cardiovascular: Negative for chest pain, palpitations and leg swelling.  Gastrointestinal: Negative for abdominal distention, abdominal pain, anal bleeding, blood in stool, constipation, diarrhea, nausea and vomiting.  Genitourinary: Negative for difficulty urinating, dysuria, flank pain and hematuria.  Musculoskeletal: Positive for arthralgias. Negative for back pain, gait problem, joint swelling and myalgias.  Skin: Positive for rash. Negative for color change, pallor and wound.  Neurological: Negative for dizziness, tremors, weakness and light-headedness.  Hematological: Negative for adenopathy. Does not bruise/bleed easily.  Psychiatric/Behavioral: Negative for agitation, behavioral problems, confusion, decreased concentration, dysphoric mood and sleep disturbance.       Objective:   Physical Exam Constitutional:      General: He is not in acute distress.    Appearance: Normal appearance. He is well-developed. He is not ill-appearing or diaphoretic.  HENT:     Head: Normocephalic and atraumatic.     Right Ear: Hearing and external ear normal.     Left Ear: Hearing and external ear normal.     Nose: No nasal deformity or rhinorrhea.  Eyes:     General: No scleral icterus.    Conjunctiva/sclera: Conjunctivae normal.     Right eye: Right conjunctiva is not injected.     Left eye: Left conjunctiva is not injected.  Neck:     Vascular: No JVD.  Cardiovascular:     Rate and Rhythm: Normal rate and regular rhythm.     Heart sounds: S1 normal and S2 normal.  Pulmonary:     Effort: No respiratory distress.  Abdominal:     General: There is no distension.     Palpations: Abdomen is soft.     Tenderness:  There is no abdominal tenderness.  Musculoskeletal:        General: Normal range of motion.     Right shoulder: Normal.     Left shoulder: Normal.     Cervical back: Normal range of motion and neck supple.     Right hip: Normal.     Left hip: Normal.     Right knee: Normal.     Left knee: Normal.  Lymphadenopathy:     Head:     Right side of head: No submandibular, preauricular or posterior auricular adenopathy.     Left side of head: No submandibular, preauricular or  posterior auricular adenopathy.     Cervical: No cervical adenopathy.     Right cervical: No superficial or deep cervical adenopathy.    Left cervical: No superficial or deep cervical adenopathy.  Skin:    General: Skin is warm and dry.     Coloration: Skin is not jaundiced or pale.     Findings: No abrasion, bruising, ecchymosis, erythema, lesion or rash.     Nails: There is no clubbing.  Neurological:     General: No focal deficit present.     Mental Status: He is alert and oriented to person, place, and time.     Sensory: No sensory deficit.     Coordination: Coordination normal.     Gait: Gait normal.  Psychiatric:        Attention and Perception: Attention normal. He is attentive.        Mood and Affect: Affect normal.        Speech: Speech normal.        Behavior: Behavior normal. Behavior is cooperative.        Thought Content: Thought content normal.        Judgment: Judgment normal.    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  We will plan on keeping him off antibiotics and having an aspirate of the shoulder performed roughly 6 weeks after coming off antibiotics  Erythema involving shoulder and chest: This is bothersome to me I do not see it before although it he says it was present for last 5 to 6 months.  He says it was present while on Keflex and  after coming off of it.  Says it is worsening slowly.

## 2019-09-17 NOTE — Patient Instructions (Signed)
Four Season COVID vaccine number  [id number]  COVID-19 Vaccine Information can be found at: [email or web address] For questions related to vaccine distribution or appointments, please email [email or web address] or call [phone].

## 2019-10-01 DIAGNOSIS — T8459XA Infection and inflammatory reaction due to other internal joint prosthesis, initial encounter: Secondary | ICD-10-CM | POA: Diagnosis not present

## 2019-10-01 DIAGNOSIS — Z6830 Body mass index (BMI) 30.0-30.9, adult: Secondary | ICD-10-CM | POA: Diagnosis not present

## 2019-10-15 DIAGNOSIS — Z96612 Presence of left artificial shoulder joint: Secondary | ICD-10-CM | POA: Diagnosis not present

## 2019-10-16 ENCOUNTER — Other Ambulatory Visit: Payer: Self-pay | Admitting: Orthopedic Surgery

## 2019-10-16 DIAGNOSIS — Z96612 Presence of left artificial shoulder joint: Secondary | ICD-10-CM

## 2019-10-17 ENCOUNTER — Ambulatory Visit
Admission: RE | Admit: 2019-10-17 | Discharge: 2019-10-17 | Disposition: A | Discharge status: Home / Self Care | Payer: PPO | Source: Ambulatory Visit | Attending: Orthopedic Surgery | Admitting: Orthopedic Surgery

## 2019-10-17 ENCOUNTER — Other Ambulatory Visit: Payer: Self-pay

## 2019-10-17 DIAGNOSIS — Z96612 Presence of left artificial shoulder joint: Secondary | ICD-10-CM | POA: Diagnosis not present

## 2019-10-17 DIAGNOSIS — Z96619 Presence of unspecified artificial shoulder joint: Secondary | ICD-10-CM | POA: Diagnosis not present

## 2019-10-17 DIAGNOSIS — T8459XA Infection and inflammatory reaction due to other internal joint prosthesis, initial encounter: Secondary | ICD-10-CM | POA: Diagnosis not present

## 2019-10-17 DIAGNOSIS — T8459XD Infection and inflammatory reaction due to other internal joint prosthesis, subsequent encounter: Secondary | ICD-10-CM | POA: Diagnosis not present

## 2019-10-21 ENCOUNTER — Telehealth: Payer: Self-pay | Admitting: *Deleted

## 2019-10-21 NOTE — Telephone Encounter (Signed)
#  1 Please have his surgeon call me  #2 Please have patient restart his oral antibiotics  #3 I will see about the schedule I have to help get Maddie from school at minimum even if she walks to school  Once I have spoken with surgeon I can give him a more informed opinion but I suspect long standing antibiotics WITHOUT further surgery is the correct tpathwa6y but let me talk to his surgeon

## 2019-10-21 NOTE — Telephone Encounter (Signed)
Phone number per patient Dr [name] - cell [phone], office [phone].  He has keflex 500 mg at home, will resume taking 1000 mg in morning, 1000 mg in evening. He does not like the way it makes him feel, if there is an alternative, but he will take it. Patient would like to know the follow up, update after conversation with Dr [name].

## 2019-10-21 NOTE — Telephone Encounter (Signed)
Patient had shoulder aspiration 4/23, cultures still pending.  He is able to see 2 results that concern him - elevated white blood cell count and neutrophil count.  He is scheduled for follow up with Dr Tommy Medal 5/18, but this needs to be rescheduled at provider's request.  Patient is asking for advice on results, ID plan, and when to follow up at [facility]. Landis Gandy, RN

## 2019-10-22 NOTE — Telephone Encounter (Signed)
RN relayed Dr Derek Mound message to patient: spoke w Geoffrey West. WBC of 19k mainly PMNs concerning for infection. He doesn't want to wash out joint unless a more virulent organism than he's had before. If something grows again like p acne's he would also like pt to see Psychologist, prison and probation services of his at [facility]. He agrees w plan of chronic oral antibiotics for now.  Patient will take keflex 500 mg QID per Dr Lucianne Lei Dam's advice.  He follows up 5/25 with Dr Hubert Azure, Lanice Schwab, RN

## 2019-10-31 LAB — SYNOVIAL CELL COUNT + DIFF, W/ CRYSTALS
Basophils, %: 0 %
Eosinophils-Synovial: 0 % (ref 0–2)
Lymphocytes-Synovial Fld: 3 % (ref 0–74)
Monocyte/Macrophage: 8 % (ref 0–69)
Neutrophil, Synovial: 89 % — ABNORMAL HIGH (ref 0–24)
Synoviocytes, %: 0 % (ref 0–15)
WBC, Synovial: 19236 cells/uL — ABNORMAL HIGH (ref <150)

## 2019-10-31 LAB — ANAEROBIC AND AEROBIC CULTURE
AER RESULT:: NO GROWTH
MICRO NUMBER:: 10399387
MICRO NUMBER:: 10399388
SPECIMEN QUALITY:: ADEQUATE
SPECIMEN QUALITY:: ADEQUATE

## 2019-11-03 ENCOUNTER — Telehealth: Payer: Self-pay

## 2019-11-03 NOTE — Telephone Encounter (Signed)
Patient called following up on culture results. Patient previously spoke with Sharyn Lull, RN about results but was unsure if there updates. Is current antibiotic regimen appropriate? Anything else we need to discuss with him?  Thanks! Livier Hendel Lorita Officer, RN

## 2019-11-04 NOTE — Telephone Encounter (Signed)
Given no organisms grew he should stay on the antibiotics we have him on to target the known Propionibacterium that was isolated previously

## 2019-11-04 NOTE — Telephone Encounter (Signed)
Relayed provider note to patient to remain on oral antibiotics given culture resutls. Follow up appointment scheduled on 5/25.  Severus Brodzinski Lorita Officer, RN

## 2019-11-07 ENCOUNTER — Ambulatory Visit (INDEPENDENT_AMBULATORY_CARE_PROVIDER_SITE_OTHER): Payer: PPO | Admitting: Infectious Diseases

## 2019-11-07 ENCOUNTER — Telehealth: Payer: Self-pay

## 2019-11-07 ENCOUNTER — Other Ambulatory Visit: Payer: Self-pay

## 2019-11-07 DIAGNOSIS — T8459XD Infection and inflammatory reaction due to other internal joint prosthesis, subsequent encounter: Secondary | ICD-10-CM

## 2019-11-07 DIAGNOSIS — Z96619 Presence of unspecified artificial shoulder joint: Secondary | ICD-10-CM

## 2019-11-07 NOTE — Telephone Encounter (Signed)
Patient walked into office with complaint of increased left shoulder pain.   Upon inspection left upper shoulder and upper chest appears red and warm to touch. Patient states the discoloration has spread in the last week or so as the pain increased.   Area of concern measures   20cm X 19. Outside borders marked and dated.   I will add this patient to schedule for today with Dr Johnnye Sima for evaluation.   Laverle Patter, RN , BSN, MSN

## 2019-11-07 NOTE — Progress Notes (Signed)
Subjective:    Patient ID: Geoffrey West, male    DOB: 08/18/1955, 64 y.o.   MRN: [id number]  HPI 64yo M with HTN, gout, low testosterone and failed shoulder arthroplasty. He was taken to OR by Dr. [name] on June 15th of 2018 and had shoulder revision and reversal of total shoulder arthroplasty. His Cx grew propionobacterium and MSSE (pan-sens).   He was treated with ceftriaxone/rifampin then zyvox and rifampin. He was ultimately transitioned to augmentin to complete 6 months of therapy.  He returned to the OR July 92019and underwent explantation of all components of his left reverse shoulder arthroplasty with insertion of antibiotic spacer in the left shoulder with open reduction internal internal fixation of a left proximal humerus fracture. There is a retained screw.  His Cx again grew Propionibacterium on all 4 specimens were sent. Patient was treated with ceftriaxone as an inpatient also had gotten vancomycin. He was ultimately discharged on oral Keflex 500 mg 4 times daily because he did not want have a PICC line again.  Unfortunately not gain control of infection with the surgery and antibiotics and he had to go back to the operating room yet again timbre underwent she underwent explantation of his humeral antibiotic spacer, bone grafting of the left glenoid fossa bone defect excisional debridement of bone muscle subcutaneous tissue and placement of a left antibiotic spacer.Culturesdone in September on antibiotics failed to yield an organism. He ultimately was taken back to the operating room in [place] on oral Keflex per our recommendations and underwent plan of his humeral antibiotic spacer and reversal of his total shoulder revision.  He returned to the operating room July 16, 2018 and underwent excisional and non-excisional debridement of infected left shoulder prosthesis including skin subcutaneous tissue muscle and fascia. A reverse total shoulder revision  was performed with polyethylene and glenosphere head ball of 2 components. His Cx were (-). He was treated with IV ceftriaxone then transitioned to po keflex.    He had further shoulder surgeries on January 04, 2019 he underwent a left reverse shoulder revision of one component and explantation of the glenosphere and placement of a CTA head, along with excisional debridement of left shoulder including pseudocapsule bone muscle and skin. Cx were (-).   He then went back to the operating room on July 2 and underwent revision of his left shoulder hemiarthroplasty with revision of the humeral head. There were seromas encountered intraoperatively but Dr. [name] did not feel that any of this looked purulent.  At his last visit, he was resumed on keflex due to shoulder pain and increased inflammatory markers (CRP 22). He believes he has been back on rx for 2- 2.5 weeks. Has given him some GI upset.  He had CT scan 07-2019 Marked sclerosis and concavity of the glenoid consistent with history of prior infection and degenerative change. As noted above, the superior and inferior rim of the glenoid are thinned. Retained screw fragment in the superior glenoid noted    He had arthroscopy 09-2019 which showed 19,236 WBC, Cx was (-).   Over last few weeks has had increasing erythema of his L shoulder. This has spread since October. No f/c. Skin has felt warm, and been more painful. Can't do ADL due to his previous surgeries.   Review of Systems  Constitutional: Negative for appetite change, chills, fever and unexpected weight change.  Musculoskeletal: Positive for arthralgias and joint swelling.  Skin: Positive for rash.       Objective:  Physical Exam Constitutional:      General: He is not in acute distress.    Appearance: Normal appearance. He is normal weight. He is not toxic-appearing.  HENT:     Mouth/Throat:     Mouth: Mucous membranes are moist.     Pharynx: No oropharyngeal exudate.  Eyes:      Extraocular Movements: Extraocular movements intact.     Pupils: Pupils are equal, round, and reactive to light.  Cardiovascular:     Rate and Rhythm: Normal rate and regular rhythm.  Pulmonary:     Effort: Pulmonary effort is normal.     Breath sounds: Normal breath sounds.  Chest:    Abdominal:     General: Bowel sounds are normal. There is no distension.     Palpations: Abdomen is soft.     Tenderness: There is no abdominal tenderness.  Neurological:     Mental Status: He is alert.           Assessment & Plan:

## 2019-11-07 NOTE — Assessment & Plan Note (Signed)
Will repeat his ESR and CRP Will continue keflex for now Will repeat his CT sacn.  Could consider po clinda as an alternative oral rx ( +/- rifampin).  He is very hesitant to have another PIC line.  See him back in 1 week with myself or Dr Tommy Medal.

## 2019-11-08 LAB — BASIC METABOLIC PANEL
BUN/Creatinine Ratio: 15 (calc) (ref 6–22)
BUN: 21 mg/dL (ref 7–25)
CO2: 32 mmol/L (ref 20–32)
Calcium: 10.1 mg/dL (ref 8.6–10.3)
Chloride: 100 mmol/L (ref 98–110)
Creat: 1.38 mg/dL — ABNORMAL HIGH (ref 0.70–1.25)
Glucose, Bld: 107 mg/dL — ABNORMAL HIGH (ref 65–99)
Potassium: 4.4 mmol/L (ref 3.5–5.3)
Sodium: 138 mmol/L (ref 135–146)

## 2019-11-08 LAB — SED RATE MANUAL WEST RFLX: SED RATE BY MODIFIED WESTERGREN,MANUAL: 14 mm/h (ref 0–20)

## 2019-11-08 LAB — SEDIMENTATION RATE

## 2019-11-08 LAB — C-REACTIVE PROTEIN: CRP: 29.2 mg/L — ABNORMAL HIGH (ref <8.0)

## 2019-11-10 ENCOUNTER — Other Ambulatory Visit: Payer: PPO

## 2019-11-11 ENCOUNTER — Ambulatory Visit: Payer: PPO | Admitting: Infectious Disease

## 2019-11-11 DIAGNOSIS — I10 Essential (primary) hypertension: Secondary | ICD-10-CM | POA: Diagnosis not present

## 2019-11-11 DIAGNOSIS — Z885 Allergy status to narcotic agent status: Secondary | ICD-10-CM | POA: Diagnosis not present

## 2019-11-11 DIAGNOSIS — M25512 Pain in left shoulder: Secondary | ICD-10-CM | POA: Diagnosis not present

## 2019-11-11 DIAGNOSIS — T8459XA Infection and inflammatory reaction due to other internal joint prosthesis, initial encounter: Secondary | ICD-10-CM | POA: Diagnosis not present

## 2019-11-11 DIAGNOSIS — Z96612 Presence of left artificial shoulder joint: Secondary | ICD-10-CM | POA: Diagnosis not present

## 2019-11-11 DIAGNOSIS — G8929 Other chronic pain: Secondary | ICD-10-CM | POA: Diagnosis not present

## 2019-11-11 DIAGNOSIS — M19012 Primary osteoarthritis, left shoulder: Secondary | ICD-10-CM | POA: Diagnosis not present

## 2019-11-13 ENCOUNTER — Inpatient Hospital Stay: Admission: RE | Admit: 2019-11-13 | Payer: PPO | Source: Ambulatory Visit

## 2019-11-18 ENCOUNTER — Other Ambulatory Visit: Payer: Self-pay

## 2019-11-18 ENCOUNTER — Ambulatory Visit: Payer: PPO | Admitting: Infectious Disease

## 2019-11-18 ENCOUNTER — Ambulatory Visit (INDEPENDENT_AMBULATORY_CARE_PROVIDER_SITE_OTHER): Payer: PPO | Admitting: Infectious Diseases

## 2019-11-18 ENCOUNTER — Encounter: Payer: Self-pay | Admitting: Infectious Diseases

## 2019-11-18 VITALS — BP 150/95 | HR 87 | Temp 98.3°F | Wt 218.0 lb

## 2019-11-18 DIAGNOSIS — Z96612 Presence of left artificial shoulder joint: Secondary | ICD-10-CM

## 2019-11-18 DIAGNOSIS — Y838 Other surgical procedures as the cause of abnormal reaction of the patient, or of later complication, without mention of misadventure at the time of the procedure: Secondary | ICD-10-CM

## 2019-11-18 DIAGNOSIS — Z96619 Presence of unspecified artificial shoulder joint: Secondary | ICD-10-CM

## 2019-11-18 DIAGNOSIS — T8459XD Infection and inflammatory reaction due to other internal joint prosthesis, subsequent encounter: Secondary | ICD-10-CM | POA: Diagnosis not present

## 2019-11-18 MED ORDER — LINEZOLID 600 MG PO TABS: 600.0000 mg | ORAL_TABLET | Freq: Two times a day (BID) | ORAL | 3 refills | Status: DC

## 2019-11-18 NOTE — Progress Notes (Signed)
Subjective:    Patient ID: Geoffrey West, male    DOB: 10/17/1955, 64 y.o.   MRN: [id number]  HPI 65yo M with HTN, gout, low testosterone and failed shoulder arthroplasty. He was taken to OR by Dr. [name] on June 15th of 2018 and had shoulder revision and reversal of total shoulder arthroplasty. His Cx grew propionobacterium and MSSE (pan-sens).   He was treated with ceftriaxone/rifampin then zyvox and rifampin. He was ultimately transitioned to augmentin to complete 6 months of therapy.  He returned to the OR July 92019and underwent explantation of all components of his left reverse shoulder arthroplasty with insertion of antibiotic spacer in the left shoulder with open reduction internal internal fixation of a left proximal humerus fracture. There is a retained screw.  His Cx again grew Propionibacterium on all 4 specimens were sent. Patient was treated with ceftriaxone as an inpatient also had gotten vancomycin. He was ultimately discharged on oral Keflex 500 mg 4 times daily because he did not want have a PICC line again.  Unfortunately not gain control of infection with the surgery and antibiotics and he had to go back to the operating room yet again timbre underwent she underwent explantation of his humeral antibiotic spacer, bone grafting of the left glenoid fossa bone defect excisional debridement of bone muscle subcutaneous tissue and placement of a left antibiotic spacer.Culturesdone in September on antibiotics failed to yield an organism. He ultimately was taken back to the operating room in [place] on oral Keflex per our recommendations and underwent plan of his humeral antibiotic spacer and reversal of his total shoulder revision.  He returned to the operating room July 16, 2018 and underwent excisional and non-excisional debridement of infected left shoulder prosthesis including skin subcutaneous tissue muscle and fascia. A reverse total shoulder revision  was performed with polyethylene and glenosphere head ball of 2 components. His Cx were (-). He was treated with IV ceftriaxone then transitioned to po keflex.    He had further shoulder surgeries on January 04, 2019 he underwent a left reverse shoulder revision of one component and explantation of the glenosphere and placement of a CTA head, along with excisional debridement of left shoulder including pseudocapsule bone muscle and skin. Cx were (-).   He then went back to the operating room on July 2 and underwent revision of his left shoulder hemiarthroplasty with revision of the humeral head. There were seromas encountered intraoperatively but Dr. [name] did not feel that any of this looked purulent.  At his last visit, he was resumed on keflex due to shoulder pain and increased inflammatory markers (CRP 22). He believes he has been back on rx for 2- 2.5 weeks. Has given him some GI upset.  He had CT scan 07-2019 Marked sclerosis and concavity of the glenoid consistent with history of prior infection and degenerative change. As noted above, the superior and inferior rim of the glenoid are thinned. Retained screw fragment in the superior glenoid noted    He had arthroscopy 09-2019 which showed 19,236 WBC, Cx was (-).   He was seen 11-07-19: increasing erythema of shoulder, warmth. He had limitation of his ADLs. He had CRP 29.2. he was supposed to have CT scan to f/u. He had ortho 2nd opinion @ [facility] 11-11-19 they decided to defer repeat CT.  He had repeat arthrocentesis that was no growth after 4 days (not held 14 days due to "contamination"). They discussed resection vs continued suppressive anbx.  He had notable erythema  on his shoulder.  He was continued on keflex.   Today he feels that his shoulder is unchanged. ROM same. Erythema unchanged.  Has GI distress due to keflex.   Review of Systems  Constitutional: Negative for appetite change, chills, fever and unexpected weight change.   Gastrointestinal: Positive for abdominal pain. Negative for constipation and diarrhea.  Musculoskeletal: Positive for arthralgias.       Objective:   Physical Exam Vitals reviewed.  Constitutional:      Appearance: Normal appearance.  Musculoskeletal:       Arms:  Neurological:     Mental Status: He is alert.           Assessment & Plan:

## 2019-11-18 NOTE — Assessment & Plan Note (Signed)
He is frustrated he is not better.  Will change his keflex to zyvox to decrease frequency of dosing.  His med list is updated to reflect meds he is not taking, no interactions with linezolid noted.  He is worried about redness (as am I) in his shoulder.  He will f/u with derm re: changes to his skin (we discussed I suspect his is secondary to his infection) Will see him back in 3 weeks.  Cc: Dr [name] (ortho).

## 2019-11-21 DIAGNOSIS — T8459XD Infection and inflammatory reaction due to other internal joint prosthesis, subsequent encounter: Secondary | ICD-10-CM | POA: Diagnosis not present

## 2019-11-21 DIAGNOSIS — Z96612 Presence of left artificial shoulder joint: Secondary | ICD-10-CM | POA: Diagnosis not present

## 2019-11-27 DIAGNOSIS — R21 Rash and other nonspecific skin eruption: Secondary | ICD-10-CM | POA: Diagnosis not present

## 2019-11-27 DIAGNOSIS — T84019A Broken internal joint prosthesis, unspecified site, initial encounter: Secondary | ICD-10-CM | POA: Diagnosis not present

## 2019-11-28 DIAGNOSIS — R21 Rash and other nonspecific skin eruption: Secondary | ICD-10-CM | POA: Diagnosis not present

## 2019-11-28 DIAGNOSIS — L920 Granuloma annulare: Secondary | ICD-10-CM | POA: Diagnosis not present

## 2019-11-28 DIAGNOSIS — D485 Neoplasm of uncertain behavior of skin: Secondary | ICD-10-CM | POA: Diagnosis not present

## 2019-11-28 DIAGNOSIS — L0889 Other specified local infections of the skin and subcutaneous tissue: Secondary | ICD-10-CM | POA: Diagnosis not present

## 2019-11-28 DIAGNOSIS — H00012 Hordeolum externum right lower eyelid: Secondary | ICD-10-CM | POA: Diagnosis not present

## 2019-12-09 ENCOUNTER — Ambulatory Visit (INDEPENDENT_AMBULATORY_CARE_PROVIDER_SITE_OTHER): Payer: PPO | Admitting: Infectious Diseases

## 2019-12-09 ENCOUNTER — Encounter: Payer: Self-pay | Admitting: Infectious Diseases

## 2019-12-09 ENCOUNTER — Other Ambulatory Visit: Payer: Self-pay

## 2019-12-09 ENCOUNTER — Telehealth: Payer: Self-pay

## 2019-12-09 DIAGNOSIS — F419 Anxiety disorder, unspecified: Secondary | ICD-10-CM | POA: Insufficient documentation

## 2019-12-09 DIAGNOSIS — Z96612 Presence of left artificial shoulder joint: Secondary | ICD-10-CM

## 2019-12-09 DIAGNOSIS — T8459XD Infection and inflammatory reaction due to other internal joint prosthesis, subsequent encounter: Secondary | ICD-10-CM

## 2019-12-09 DIAGNOSIS — G479 Sleep disorder, unspecified: Secondary | ICD-10-CM | POA: Diagnosis not present

## 2019-12-09 DIAGNOSIS — M2550 Pain in unspecified joint: Secondary | ICD-10-CM | POA: Diagnosis not present

## 2019-12-09 DIAGNOSIS — Z96619 Presence of unspecified artificial shoulder joint: Secondary | ICD-10-CM

## 2019-12-09 DIAGNOSIS — F329 Major depressive disorder, single episode, unspecified: Secondary | ICD-10-CM

## 2019-12-09 DIAGNOSIS — E291 Testicular hypofunction: Secondary | ICD-10-CM | POA: Diagnosis not present

## 2019-12-09 DIAGNOSIS — E039 Hypothyroidism, unspecified: Secondary | ICD-10-CM | POA: Diagnosis not present

## 2019-12-09 NOTE — Telephone Encounter (Signed)
[facility]. Office was currently closed when I called.  Voicemail was left requesting  they fax labs and last office visit for the patient Per Dr. Algis Downs request.  Geoffrey West

## 2019-12-09 NOTE — Progress Notes (Signed)
Subjective:    Patient ID: Geoffrey West, male    DOB: Nov 23, 1955, 64 y.o.   MRN: [id number]  HPI 64yo M withHTN, gout, low testosterone and failed shoulder arthroplasty. He was taken to OR by Dr. [name] on June 15th of 2018 and had shoulder revision and reversal of total shoulder arthroplasty.His Cx grewpropionobacterium and MSSE (pan-sens).   He was treated with ceftriaxone/rifampin then zyvox and rifampin. He was ultimately transitioned to augmentin to complete 6 months of therapy.  He returned to the [place] underwent explantation of all components of his left reverse shoulder arthroplasty with insertion of antibiotic spacer in the left shoulder with open reduction internal internal fixation of a left proximal humerus fracture.There is a retained screw.  His Cx again grewPropionibacterium on all 4 specimens were sent. Patient was treated with ceftriaxone as an inpatient also had gotten vancomycin. He was ultimately discharged on oral Keflex 500 mg 4 times daily because he did not want have a PICC line again.  Unfortunately, he had to go back to OR and underwent explantation of his humeral antibiotic spacer, bone grafting of the left glenoid fossa bone defect excisional debridement of bone muscle subcutaneous tissue and placement of a left antibiotic spacer.Culturesdone in September on antibiotics failed to yield an organism. He ultimately was taken back to the operating room in [place] on oral Keflex and underwent plan of his humeral antibiotic spacer and reversal of his total shoulder revision.  He returnedto the operating room January 21, 2020and underwent excisional and non-excisional debridement of infected left shoulder prosthesis including skin subcutaneous tissue muscle and fascia. A reverse total shoulder revision was performed with polyethylene and glenosphere head ball of 2 components.His Cx were (-). He was treated with IV ceftriaxone then  transitioned to po keflex.  He had furthershoulder surgeries on January 04, 2019 he underwent a left reverse shoulder revision of one component and explantation of the glenosphere and placement of a CTA head, along with excisional debridement of left shoulder including pseudocapsule bone muscle and skin. Cx were (-).  He then went back to the operating room on July 2 and underwent revision of his left shoulder hemiarthroplasty with revision of the humeral head. There were seromas encountered intraoperatively but Dr. [name] did not feel that any of this looked purulent.  At his last visit, he was resumed on keflex due to shoulder pain and increased inflammatory markers (CRP 22). He believes he has been back on rx for 2- 2.5 weeks. Has given him some GI upset.  He had CT scan 07-2019 Marked sclerosis and concavity of the glenoid consistent with history of prior infection and degenerative change. As noted above, the superior and inferior rim of the glenoid are thinned. Retained screw fragment in the superior glenoid noted  He had arthroscopy 09-2019 which showed 19,236 WBC, Cx was (-).   He was seen 11-07-19: increasing erythema of shoulder, warmth. He had limitation of his ADLs. He had CRP 29.2. he was supposed to have CT scan to f/u. He had ortho 2nd opinion @ [facility] 11-11-19 they decided to defer repeat CT.  He had repeat arthrocentesis that was no growth after 4 days (not held 14 days due to "contamination"). They discussed resection vs continued suppressive anbx.  He previously had notable erythema on his shoulder.  He was continued on keflex until 11-18-19 when he was changed to zyvox.  His shoulders (both) improved while on this until he then developed pain in his R shoulder.  He was also scheduled for a repeat CT scan but told by pther surgeon he did not need it.   He was seen by derm for persistent erythema of his shoulder- bx  Light growth of P agglomerans.  He was seen by allergist, did  not think he has prosthetic allergy causing this.    Has felt depressed about continued lack of clarity around his dx.   L shoulder remains red, he feels expanding.   Review of Systems  Constitutional: Negative for chills and fever.  Gastrointestinal: Positive for nausea. Negative for constipation, diarrhea and vomiting.  Genitourinary: Negative for difficulty urinating.  Musculoskeletal: Positive for arthralgias and joint swelling.       Objective:   Physical Exam Vitals reviewed.  Constitutional:      Appearance: He is obese.  Musculoskeletal:     Left shoulder: No swelling, deformity, tenderness or bony tenderness.       Arms:           Assessment & Plan:

## 2019-12-09 NOTE — Assessment & Plan Note (Addendum)
The etiology of his erythema is unlcear.  Will check his ESR and CRP to guide anbx use.  Will get his results from Derm.  Will call him with results.  I doubt that the P agglomerans is significant.  I am not able to download his current photo due to inter-net issues.  He was seen at [facility] ID- they suggested stop anbx and repeat his aspirate 2 weeks later.

## 2019-12-09 NOTE — Assessment & Plan Note (Signed)
Feels like this is worsening (as well as his depression) and he would like to restart paxil.  I warned him about drug interactions between this and zyvox.  He will hold til we talk.

## 2019-12-09 NOTE — Addendum Note (Signed)
Addended by: Shandon Matson C on: 12/09/2019 05:05 PM   Modules accepted: Level of Service

## 2019-12-10 DIAGNOSIS — Z96612 Presence of left artificial shoulder joint: Secondary | ICD-10-CM | POA: Diagnosis not present

## 2019-12-10 LAB — CBC
HCT: 48.4 % (ref 38.5–50.0)
Hemoglobin: 16.7 g/dL (ref 13.2–17.1)
MCH: 30.3 pg (ref 27.0–33.0)
MCHC: 34.5 g/dL (ref 32.0–36.0)
MCV: 87.8 fL (ref 80.0–100.0)
MPV: 10.7 fL (ref 7.5–12.5)
Platelets: 212 10*3/uL (ref 140–400)
RBC: 5.51 10*6/uL (ref 4.20–5.80)
RDW: 14.1 % (ref 11.0–15.0)
WBC: 5.8 10*3/uL (ref 3.8–10.8)

## 2019-12-10 LAB — C-REACTIVE PROTEIN: CRP: 8.4 mg/L — ABNORMAL HIGH (ref <8.0)

## 2019-12-10 LAB — SEDIMENTATION RATE: Sed Rate: 28 mm/h — ABNORMAL HIGH (ref 0–20)

## 2019-12-10 NOTE — Telephone Encounter (Signed)
Called patient for dermatology contact information. Per patient he was seen at [facility] by Dr. Elvera Lennox.  Left voicemail with Triage Rn requesting last office note and labs be faxed to [phone]. [facility] P:  [phone] [place] [address], [place]

## 2019-12-11 ENCOUNTER — Telehealth: Payer: Self-pay | Admitting: Infectious Diseases

## 2019-12-11 DIAGNOSIS — G479 Sleep disorder, unspecified: Secondary | ICD-10-CM | POA: Diagnosis not present

## 2019-12-11 DIAGNOSIS — E291 Testicular hypofunction: Secondary | ICD-10-CM | POA: Diagnosis not present

## 2019-12-11 DIAGNOSIS — F3289 Other specified depressive episodes: Secondary | ICD-10-CM | POA: Diagnosis not present

## 2019-12-11 DIAGNOSIS — E039 Hypothyroidism, unspecified: Secondary | ICD-10-CM | POA: Diagnosis not present

## 2019-12-11 NOTE — Telephone Encounter (Signed)
He has been on anbx since Feb 2021.  WIll stop and watch his progress- his sed rate has been stable, CRP is better.  Will f/u his derm report.  Will see him in 1 month.  He will f/u with his surgeon re further surgery (debridement). Dr [name], [place] Cornea.

## 2019-12-12 DIAGNOSIS — F419 Anxiety disorder, unspecified: Secondary | ICD-10-CM | POA: Diagnosis not present

## 2019-12-12 DIAGNOSIS — I10 Essential (primary) hypertension: Secondary | ICD-10-CM | POA: Diagnosis not present

## 2019-12-15 ENCOUNTER — Telehealth: Payer: Self-pay

## 2019-12-15 NOTE — Telephone Encounter (Signed)
Patient states he forgot to ask if he should stop antibiotic at last visit.   Per 12-11-2019 office visit patient was advised to stop antibiotic and watch progress.  Sed rate is stable. CRP is better.   Patient states Dr Johnnye Sima was to follow up with Dr Merryl Hacker with [facility] progress note to determine what next step may be related to shoulder rash.   Laverle Patter, RN

## 2019-12-16 ENCOUNTER — Telehealth: Payer: Self-pay | Admitting: Infectious Diseases

## 2019-12-16 NOTE — Telephone Encounter (Signed)
CRP 8.4 <--29.2 ESR 28 <--22  Feels better after stopping anbx on 6-20.  Will watch him off abx He has f/u apt in 3 weeks His derm note/records are explained.  Will await his f/u with his surgeon. Pt does not believe he can have further surgeries.  Still has prosthetics in place.

## 2019-12-18 DIAGNOSIS — L720 Epidermal cyst: Secondary | ICD-10-CM | POA: Diagnosis not present

## 2019-12-18 DIAGNOSIS — L920 Granuloma annulare: Secondary | ICD-10-CM | POA: Diagnosis not present

## 2019-12-31 NOTE — Pre-Procedure Instructions (Signed)
Your procedure is scheduled on Tuesday, July 13th.  Report to [facility] Entrance "A" at 12:15 P.M., and check in at the Admitting office.  Call this number if you have problems the morning of surgery:  [phone]  Call [phone] if you have any questions prior to your surgery date Monday-Friday 8am-4pm    Remember:  Do not eat after midnight the night before your surgery  You may drink clear liquids until 11:15 a.m. the morning of your surgery.   Clear liquids allowed are: Water, Non-Citrus Juices (without pulp), Carbonated Beverages, Clear Tea, Black Coffee Only, and Gatorade.   Enhanced Recovery after Surgery for Orthopedics Enhanced Recovery after Surgery is a protocol used to improve the stress on your body and your recovery after surgery.  Patient Instructions  . The night before surgery:  o No food after midnight. ONLY clear liquids after midnight  .  [name] The day of surgery (if you do NOT have diabetes):  o Drink ONE (1) Pre-Surgery Clear Ensure by 11:15 am the morning of surgery   o This drink was given to you during your hospital  pre-op appointment visit. o Nothing else to drink after completing the  Pre-Surgery Clear Ensure.          If you have questions, please contact your surgeon's office.     Take these medicines the morning of surgery with A SIP OF WATER  allopurinol (ZYLOPRIM)  ARMOUR THYROID  pantoprazole (PROTONIX) fluticasone (FLONASE) -as needed clonazePAM (KLONOPIN) -as needed  As of today, STOP taking any Aspirin (unless otherwise instructed by your surgeon) Aleve, Naproxen, Ibuprofen, Motrin, Advil, Goody's, BC's, all herbal medications, fish oil, and all vitamins. This includes: diclofenac (VOLTAREN).                       Do not wear jewelry.            Do not wear lotions, powders, colognes, or deodorant.            Men may shave face and neck.            Do not bring valuables to the hospital.            [facility] is not  responsible for any belongings or valuables.  Do NOT Smoke (Tobacco/Vaping) or drink Alcohol 24 hours prior to your procedure If you use a CPAP at night, you may bring all equipment for your overnight stay.   Contacts, glasses, dentures or bridgework may not be worn into surgery.      For patients admitted to the hospital, discharge time will be determined by your treatment team.   Patients discharged the day of surgery will not be allowed to drive home, and someone needs to stay with them for 24 hours.                                     [facility]- Preparing for Total Shoulder Arthroplasty   Before surgery, you can play an important role. Because skin is not sterile, your skin needs to be as free of germs as possible. You can reduce the number of germs on your skin by using the following products. . Benzoyl Peroxide Gel o Reduces the number of germs present on the skin o Applied twice a day to shoulder area starting two days before surgery   . Chlorhexidine Gluconate (CHG)  Soap o An antiseptic cleaner that kills germs and bonds with the skin to continue killing germs even after washing o Used for showering the night before surgery and morning of surgery   Oral Hygiene is also important to reduce your risk of infection.                                    Remember - BRUSH YOUR TEETH THE MORNING OF SURGERY WITH YOUR REGULAR TOOTHPASTE  ==================================================================  Please follow these instructions carefully:  BENZOYL PEROXIDE 5% GEL  Please do not use if you have an allergy to benzoyl peroxide.   If your skin becomes reddened/irritated stop using the benzoyl peroxide.  Starting two days before surgery, apply as follows: 1. Apply benzoyl peroxide in the morning and at night. Apply after taking a shower. If you are not taking a shower clean entire shoulder front, back, and side along with the armpit with a clean wet washcloth.  2. Place a  quarter-sized dollop on your shoulder and rub in thoroughly, making sure to cover the front, back, and side of your shoulder, along with the armpit.   2 days before ____ AM   ____ PM              1 day before ____ AM   ____ PM                            3.  Do this twice a day for two days.  (Last application is the night before surgery, AFTER using the CHG soap as described below).  4. Do NOT apply benzoyl peroxide gel on the day of surgery.  CHLORHEXIDINE GLUCONATE (CHG) SOAP  Please do not use if you have an allergy to CHG or antibacterial soaps. If your skin becomes reddened/irritated stop using the CHG.   Do not shave (including legs and underarms) for at least 48 hours prior to first CHG shower. It is OK to shave your face.  Starting the night before surgery, use CHG soap as follows:  1. Shower the NIGHT BEFORE SURGERY and MORNING OF SURGERY with CHG.  2. If you choose to wash your hair, wash your hair first as usual with your normal shampoo.  3. After shampooing, rinse your hair and body thoroughly to remove the shampoo.  4. Use CHG as you would any other liquid soap.  You can apply CHG directly to the skin and wash gently with a scrungie or a clean washcloth.  5. Apply the CHG soap to your body ONLY FROM THE NECK DOWN.  Do not use on open wounds or open sores.  Avoid contact with your eyes, ears, mouth, and genitals (private parts).  Wash face and genitals (private parts) with your normal soap.  6. Wash thoroughly, paying special attention to the area where your surgery will be performed.  7. Thoroughly rinse your body with warm water from the neck down.  8. DO NOT shower/wash with your normal soap after using and rinsing off the CHG soap.   9. Pat yourself dry with a CLEAN TOWEL.   10.  Apply benzoyl peroxide.   11. Wear CLEAN PAJAMAS to bed the night before surgery; wear comfortable clothes the morning of surgery.  12. Place CLEAN SHEETS on your bed the night of  your first shower and DO NOT SLEEP WITH PETS.  Day of Surgery: Shower as above Do not apply any deodorants/lotions.  Please wear clean clothes to the hospital/surgery center.   Remember to brush your teeth WITH YOUR REGULAR TOOTHPASTE.

## 2020-01-01 ENCOUNTER — Other Ambulatory Visit: Payer: Self-pay

## 2020-01-01 ENCOUNTER — Encounter (HOSPITAL_COMMUNITY): Payer: Self-pay

## 2020-01-01 ENCOUNTER — Encounter (HOSPITAL_COMMUNITY)
Admission: RE | Admit: 2020-01-01 | Discharge: 2020-01-01 | Disposition: A | Discharge status: Home / Self Care | Payer: PPO | Source: Ambulatory Visit | Attending: Orthopedic Surgery | Admitting: Orthopedic Surgery

## 2020-01-01 DIAGNOSIS — Z01818 Encounter for other preprocedural examination: Secondary | ICD-10-CM | POA: Diagnosis not present

## 2020-01-01 LAB — CBC
HCT: 49.7 % (ref 39.0–52.0)
Hemoglobin: 16 g/dL (ref 13.0–17.0)
MCH: 29.7 pg (ref 26.0–34.0)
MCHC: 32.2 g/dL (ref 30.0–36.0)
MCV: 92.2 fL (ref 80.0–100.0)
Platelets: 277 10*3/uL (ref 150–400)
RBC: 5.39 MIL/uL (ref 4.22–5.81)
RDW: 15.9 % — ABNORMAL HIGH (ref 11.5–15.5)
WBC: 9.7 10*3/uL (ref 4.0–10.5)
nRBC: 0 % (ref 0.0–0.2)

## 2020-01-01 LAB — BASIC METABOLIC PANEL
Anion gap: 7 (ref 5–15)
BUN: 20 mg/dL (ref 8–23)
CO2: 32 mmol/L (ref 22–32)
Calcium: 9.8 mg/dL (ref 8.9–10.3)
Chloride: 100 mmol/L (ref 98–111)
Creatinine, Ser: 1.34 mg/dL — ABNORMAL HIGH (ref 0.61–1.24)
GFR calc Af Amer: >60 mL/min (ref >60)
GFR calc non Af Amer: 56 mL/min — ABNORMAL LOW (ref >60)
Glucose, Bld: 101 mg/dL — ABNORMAL HIGH (ref 70–99)
Potassium: 4 mmol/L (ref 3.5–5.1)
Sodium: 139 mmol/L (ref 135–145)

## 2020-01-01 LAB — SURGICAL PCR SCREEN
MRSA, PCR: NEGATIVE
Staphylococcus aureus: NEGATIVE

## 2020-01-01 NOTE — Progress Notes (Signed)
PCP - Dr. Orpah Melter Cardiologist - denies  PPM/ICD - N/A Device Orders -N/A  Rep Notified - N/A  Chest x-ray - N/A EKG - 01/01/20 Stress Test -2011  ECHO - 2011 Cardiac Cath -denies   Sleep Study - denies CPAP - denies  Blood Thinner Instructions:N/A Aspirin Instructions:N/A  ERAS Protcol -Protocol initiated, pt instructed to cease clears by 1115 the day of surgery. PRE-SURGERY Ensure or G2- pt provided with (1) pre-surgical ensure.   COVID TEST- scheduled for Saturday, 01/03/20.   Anesthesia review: No  Patient denies shortness of breath, fever, cough and chest pain at PAT appointment   All instructions explained to the patient, with a verbal understanding of the material. Patient agrees to go over the instructions while at home for a better understanding. Patient also instructed to self quarantine after being tested for COVID-19. The opportunity to ask questions was provided.    Coronavirus Screening  Have you experienced the following symptoms:  Cough yes/no: No Fever (>100.28F)  yes/no: No Runny nose yes/no: No Sore throat yes/no: No Difficulty breathing/shortness of breath  yes/no: No  Have you or a family member traveled in the last 14 days and where? yes/no: No   If the patient indicates "YES" to the above questions, their PAT will be rescheduled to limit the exposure to others and, the surgeon will be notified. THE PATIENT WILL NEED TO BE ASYMPTOMATIC FOR 14 DAYS.   If the patient is not experiencing any of these symptoms, the PAT nurse will instruct them to NOT bring anyone with them to their appointment since they may have these symptoms or traveled as well.   Please remind your patients and families that hospital visitation restrictions are in effect and the importance of the restrictions.

## 2020-01-02 DIAGNOSIS — L0109 Other impetigo: Secondary | ICD-10-CM | POA: Diagnosis not present

## 2020-01-02 DIAGNOSIS — L0889 Other specified local infections of the skin and subcutaneous tissue: Secondary | ICD-10-CM | POA: Diagnosis not present

## 2020-01-03 ENCOUNTER — Other Ambulatory Visit (HOSPITAL_COMMUNITY)
Admission: RE | Admit: 2020-01-03 | Discharge: 2020-01-03 | Disposition: A | Discharge status: Home / Self Care | Payer: PPO | Source: Ambulatory Visit | Attending: Orthopedic Surgery | Admitting: Orthopedic Surgery

## 2020-01-03 DIAGNOSIS — Z01812 Encounter for preprocedural laboratory examination: Secondary | ICD-10-CM | POA: Insufficient documentation

## 2020-01-03 DIAGNOSIS — Z20822 Contact with and (suspected) exposure to covid-19: Secondary | ICD-10-CM | POA: Diagnosis not present

## 2020-01-03 LAB — SARS CORONAVIRUS 2 (TAT 6-24 HRS): SARS Coronavirus 2: NEGATIVE

## 2020-01-05 DIAGNOSIS — T8459XD Infection and inflammatory reaction due to other internal joint prosthesis, subsequent encounter: Secondary | ICD-10-CM | POA: Diagnosis not present

## 2020-01-06 ENCOUNTER — Ambulatory Visit (HOSPITAL_COMMUNITY): Payer: PPO | Admitting: Anesthesiology

## 2020-01-06 ENCOUNTER — Ambulatory Visit (HOSPITAL_COMMUNITY): Payer: PPO | Admitting: Physician Assistant

## 2020-01-06 ENCOUNTER — Telehealth: Payer: Self-pay

## 2020-01-06 ENCOUNTER — Encounter (HOSPITAL_COMMUNITY)
Admission: RE | Disposition: A | Discharge status: Home / Self Care | Payer: Self-pay | Source: Ambulatory Visit | Attending: Orthopedic Surgery

## 2020-01-06 ENCOUNTER — Other Ambulatory Visit: Payer: Self-pay

## 2020-01-06 ENCOUNTER — Observation Stay (HOSPITAL_COMMUNITY)
Admission: RE | Admit: 2020-01-06 | Discharge: 2020-01-07 | Disposition: A | Discharge status: Home / Self Care | Payer: PPO | Source: Ambulatory Visit | Attending: Orthopedic Surgery | Admitting: Orthopedic Surgery

## 2020-01-06 ENCOUNTER — Encounter (HOSPITAL_COMMUNITY): Payer: Self-pay | Admitting: Orthopedic Surgery

## 2020-01-06 DIAGNOSIS — J069 Acute upper respiratory infection, unspecified: Secondary | ICD-10-CM | POA: Insufficient documentation

## 2020-01-06 DIAGNOSIS — Z8619 Personal history of other infectious and parasitic diseases: Secondary | ICD-10-CM | POA: Diagnosis present

## 2020-01-06 DIAGNOSIS — I10 Essential (primary) hypertension: Secondary | ICD-10-CM | POA: Diagnosis not present

## 2020-01-06 DIAGNOSIS — Z79899 Other long term (current) drug therapy: Secondary | ICD-10-CM | POA: Insufficient documentation

## 2020-01-06 DIAGNOSIS — K219 Gastro-esophageal reflux disease without esophagitis: Secondary | ICD-10-CM | POA: Diagnosis not present

## 2020-01-06 DIAGNOSIS — Z96612 Presence of left artificial shoulder joint: Secondary | ICD-10-CM | POA: Diagnosis not present

## 2020-01-06 DIAGNOSIS — M7989 Other specified soft tissue disorders: Secondary | ICD-10-CM | POA: Diagnosis not present

## 2020-01-06 DIAGNOSIS — T8459XA Infection and inflammatory reaction due to other internal joint prosthesis, initial encounter: Secondary | ICD-10-CM | POA: Diagnosis not present

## 2020-01-06 DIAGNOSIS — M25512 Pain in left shoulder: Secondary | ICD-10-CM | POA: Diagnosis present

## 2020-01-06 DIAGNOSIS — Z9889 Other specified postprocedural states: Secondary | ICD-10-CM | POA: Diagnosis present

## 2020-01-06 DIAGNOSIS — L929 Granulomatous disorder of the skin and subcutaneous tissue, unspecified: Secondary | ICD-10-CM | POA: Insufficient documentation

## 2020-01-06 DIAGNOSIS — T8450XA Infection and inflammatory reaction due to unspecified internal joint prosthesis, initial encounter: Secondary | ICD-10-CM | POA: Diagnosis not present

## 2020-01-06 DIAGNOSIS — G8918 Other acute postprocedural pain: Secondary | ICD-10-CM | POA: Diagnosis not present

## 2020-01-06 DIAGNOSIS — Z20822 Contact with and (suspected) exposure to covid-19: Secondary | ICD-10-CM | POA: Insufficient documentation

## 2020-01-06 HISTORY — PX: EXCISIONAL TOTAL SHOULDER ARTHROPLASTY WITH ANTIBIOTIC SPACER: SHX6264

## 2020-01-06 SURGERY — REMOVAL, HARDWARE, SHOULDER, WITH IRRIGATION, DEBRIDEMENT, AND INSERTION OF ANTIBIOTIC BEADS OR ANTIBIOTIC SPACER
Anesthesia: General | Site: Shoulder | Laterality: Left

## 2020-01-06 MED ORDER — CEFAZOLIN SODIUM-DEXTROSE 1-4 GM/50ML-% IV SOLN
1.0000 g | Freq: Four times a day (QID) | INTRAVENOUS | Status: AC
  Administered 2020-01-06 – 2020-01-07 (×3): 1 g via INTRAVENOUS
  Filled 2020-01-06 (×3): qty 50

## 2020-01-06 MED ORDER — ALLOPURINOL 300 MG PO TABS
300.0000 mg | ORAL_TABLET | Freq: Every day | ORAL | Status: DC
  Administered 2020-01-07: 300 mg via ORAL
  Filled 2020-01-06: qty 1

## 2020-01-06 MED ORDER — MIDAZOLAM HCL 2 MG/2ML IJ SOLN: 2.0000 mg | Freq: Once | INTRAMUSCULAR | Status: AC

## 2020-01-06 MED ORDER — THYROID 30 MG PO TABS
30.0000 mg | ORAL_TABLET | Freq: Every day | ORAL | Status: DC
  Administered 2020-01-07: 30 mg via ORAL
  Filled 2020-01-06 (×2): qty 1

## 2020-01-06 MED ORDER — PROPRANOLOL HCL ER 80 MG PO CP24
80.0000 mg | ORAL_CAPSULE | Freq: Every evening | ORAL | Status: DC
  Administered 2020-01-06: 80 mg via ORAL
  Filled 2020-01-06 (×2): qty 1

## 2020-01-06 MED ORDER — PANTOPRAZOLE SODIUM 40 MG PO TBEC
40.0000 mg | DELAYED_RELEASE_TABLET | Freq: Every day | ORAL | Status: DC
  Administered 2020-01-07: 40 mg via ORAL
  Filled 2020-01-06: qty 1

## 2020-01-06 MED ORDER — VANCOMYCIN HCL 1000 MG IV SOLR
INTRAVENOUS | Status: DC | PRN
  Administered 2020-01-06: 2000 mg

## 2020-01-06 MED ORDER — BUPIVACAINE HCL (PF) 0.5 % IJ SOLN
INTRAMUSCULAR | Status: DC | PRN
Start: 2020-01-06 — End: 2020-01-06
  Administered 2020-01-06: 10 mL via PERINEURAL

## 2020-01-06 MED ORDER — LIDOCAINE 2% (20 MG/ML) 5 ML SYRINGE
INTRAMUSCULAR | Status: DC | PRN
  Administered 2020-01-06: 60 mg via INTRAVENOUS

## 2020-01-06 MED ORDER — DOCUSATE SODIUM 100 MG PO CAPS
100.0000 mg | ORAL_CAPSULE | Freq: Two times a day (BID) | ORAL | Status: DC
  Administered 2020-01-06 – 2020-01-07 (×2): 100 mg via ORAL
  Filled 2020-01-06 (×2): qty 1

## 2020-01-06 MED ORDER — SODIUM CHLORIDE 0.9 % IR SOLN
Status: DC | PRN
  Administered 2020-01-06: 6000 mL

## 2020-01-06 MED ORDER — MIDAZOLAM HCL 2 MG/2ML IJ SOLN
INTRAMUSCULAR | Status: AC
  Administered 2020-01-06: 2 mg via INTRAVENOUS
  Filled 2020-01-06: qty 2

## 2020-01-06 MED ORDER — CHLORHEXIDINE GLUCONATE 0.12 % MT SOLN: 15.0000 mL | Freq: Once | OROMUCOSAL | Status: AC

## 2020-01-06 MED ORDER — ONDANSETRON HCL 4 MG PO TABS: 4.0000 mg | ORAL_TABLET | Freq: Four times a day (QID) | ORAL | Status: DC | PRN

## 2020-01-06 MED ORDER — FENTANYL CITRATE (PF) 100 MCG/2ML IJ SOLN
INTRAMUSCULAR | Status: DC | PRN
  Administered 2020-01-06: 100 ug via INTRAVENOUS

## 2020-01-06 MED ORDER — DEXAMETHASONE SODIUM PHOSPHATE 10 MG/ML IJ SOLN
INTRAMUSCULAR | Status: DC | PRN
  Administered 2020-01-06: 5 mg via INTRAVENOUS

## 2020-01-06 MED ORDER — DEXAMETHASONE SODIUM PHOSPHATE 10 MG/ML IJ SOLN
INTRAMUSCULAR | Status: AC
  Filled 2020-01-06: qty 1

## 2020-01-06 MED ORDER — METOCLOPRAMIDE HCL 5 MG/ML IJ SOLN: 5.0000 mg | Freq: Three times a day (TID) | INTRAMUSCULAR | Status: DC | PRN

## 2020-01-06 MED ORDER — AMLODIPINE BESYLATE 10 MG PO TABS
10.0000 mg | ORAL_TABLET | Freq: Every day | ORAL | Status: DC
  Administered 2020-01-06: 10 mg via ORAL
  Filled 2020-01-06: qty 1

## 2020-01-06 MED ORDER — 0.9 % SODIUM CHLORIDE (POUR BTL) OPTIME
TOPICAL | Status: DC | PRN
  Administered 2020-01-06: 1000 mL

## 2020-01-06 MED ORDER — FLUTICASONE PROPIONATE 50 MCG/ACT NA SUSP
2.0000 | Freq: Every day | NASAL | Status: DC | PRN
  Filled 2020-01-06: qty 16

## 2020-01-06 MED ORDER — ONDANSETRON HCL 4 MG/2ML IJ SOLN
INTRAMUSCULAR | Status: AC
  Filled 2020-01-06: qty 2

## 2020-01-06 MED ORDER — ACETAMINOPHEN 500 MG PO TABS: 1000.0000 mg | ORAL_TABLET | Freq: Once | ORAL | Status: AC

## 2020-01-06 MED ORDER — ORAL CARE MOUTH RINSE: 15.0000 mL | Freq: Once | OROMUCOSAL | Status: AC

## 2020-01-06 MED ORDER — METOCLOPRAMIDE HCL 5 MG PO TABS: 5.0000 mg | ORAL_TABLET | Freq: Three times a day (TID) | ORAL | Status: DC | PRN

## 2020-01-06 MED ORDER — TRANEXAMIC ACID-NACL 1000-0.7 MG/100ML-% IV SOLN
1000.0000 mg | Freq: Once | INTRAVENOUS | Status: AC
  Administered 2020-01-06: 1000 mg via INTRAVENOUS
  Filled 2020-01-06: qty 100

## 2020-01-06 MED ORDER — PHENOL 1.4 % MT LIQD: 1.0000 | OROMUCOSAL | Status: DC | PRN

## 2020-01-06 MED ORDER — VANCOMYCIN HCL 1000 MG IV SOLR
INTRAVENOUS | Status: AC
  Filled 2020-01-06: qty 2000

## 2020-01-06 MED ORDER — BUPIVACAINE LIPOSOME 1.3 % IJ SUSP
INTRAMUSCULAR | Status: DC | PRN
Start: 2020-01-06 — End: 2020-01-06
  Administered 2020-01-06: 10 mL via PERINEURAL

## 2020-01-06 MED ORDER — FENTANYL CITRATE (PF) 100 MCG/2ML IJ SOLN
INTRAMUSCULAR | Status: AC
  Administered 2020-01-06: 50 ug via INTRAVENOUS
  Filled 2020-01-06: qty 2

## 2020-01-06 MED ORDER — ACETAMINOPHEN 500 MG PO TABS
500.0000 mg | ORAL_TABLET | Freq: Four times a day (QID) | ORAL | Status: AC
  Administered 2020-01-06 – 2020-01-07 (×4): 500 mg via ORAL
  Filled 2020-01-06 (×4): qty 1

## 2020-01-06 MED ORDER — PROPOFOL 10 MG/ML IV BOLUS
INTRAVENOUS | Status: DC | PRN
  Administered 2020-01-06: 150 mg via INTRAVENOUS

## 2020-01-06 MED ORDER — HYDROCODONE-ACETAMINOPHEN 5-325 MG PO TABS: 1.0000 | ORAL_TABLET | ORAL | Status: DC | PRN

## 2020-01-06 MED ORDER — MORPHINE SULFATE (PF) 2 MG/ML IV SOLN: 0.5000 mg | INTRAVENOUS | Status: DC | PRN

## 2020-01-06 MED ORDER — CEFAZOLIN SODIUM-DEXTROSE 2-4 GM/100ML-% IV SOLN
2.0000 g | INTRAVENOUS | Status: AC
  Administered 2020-01-06: 2 g via INTRAVENOUS

## 2020-01-06 MED ORDER — SUGAMMADEX SODIUM 200 MG/2ML IV SOLN
INTRAVENOUS | Status: DC | PRN
Start: 2020-01-06 — End: 2020-01-06
  Administered 2020-01-06: 200 mg via INTRAVENOUS

## 2020-01-06 MED ORDER — ANASTROZOLE 1 MG PO TABS: 0.5000 mg | ORAL_TABLET | ORAL | Status: DC

## 2020-01-06 MED ORDER — LACTATED RINGERS IV SOLN: INTRAVENOUS | Status: DC

## 2020-01-06 MED ORDER — GABAPENTIN 300 MG PO CAPS
300.0000 mg | ORAL_CAPSULE | Freq: Three times a day (TID) | ORAL | Status: DC
  Administered 2020-01-06 – 2020-01-07 (×3): 300 mg via ORAL
  Filled 2020-01-06 (×3): qty 1

## 2020-01-06 MED ORDER — ONDANSETRON HCL 4 MG/2ML IJ SOLN: 4.0000 mg | Freq: Four times a day (QID) | INTRAMUSCULAR | Status: DC | PRN

## 2020-01-06 MED ORDER — DICLOFENAC SODIUM 75 MG PO TBEC
75.0000 mg | DELAYED_RELEASE_TABLET | Freq: Two times a day (BID) | ORAL | Status: DC
  Administered 2020-01-06 – 2020-01-07 (×2): 75 mg via ORAL
  Filled 2020-01-06 (×3): qty 1

## 2020-01-06 MED ORDER — FENTANYL CITRATE (PF) 100 MCG/2ML IJ SOLN: 50.0000 ug | Freq: Once | INTRAMUSCULAR | Status: AC

## 2020-01-06 MED ORDER — CLONAZEPAM 0.5 MG PO TABS: 0.5000 mg | ORAL_TABLET | Freq: Every day | ORAL | Status: DC | PRN

## 2020-01-06 MED ORDER — ROCURONIUM BROMIDE 10 MG/ML (PF) SYRINGE
PREFILLED_SYRINGE | INTRAVENOUS | Status: DC | PRN
  Administered 2020-01-06: 10 mg via INTRAVENOUS
  Administered 2020-01-06: 60 mg via INTRAVENOUS
  Administered 2020-01-06 (×2): 20 mg via INTRAVENOUS

## 2020-01-06 MED ORDER — LIDOCAINE 2% (20 MG/ML) 5 ML SYRINGE
INTRAMUSCULAR | Status: AC
  Filled 2020-01-06: qty 5

## 2020-01-06 MED ORDER — ONDANSETRON HCL 4 MG/2ML IJ SOLN
INTRAMUSCULAR | Status: DC | PRN
  Administered 2020-01-06: 4 mg via INTRAVENOUS

## 2020-01-06 MED ORDER — CEFAZOLIN SODIUM-DEXTROSE 2-4 GM/100ML-% IV SOLN
INTRAVENOUS | Status: AC
  Filled 2020-01-06: qty 100

## 2020-01-06 MED ORDER — TRANEXAMIC ACID-NACL 1000-0.7 MG/100ML-% IV SOLN
1000.0000 mg | INTRAVENOUS | Status: AC
  Administered 2020-01-06: 1000 mg via INTRAVENOUS

## 2020-01-06 MED ORDER — CHLORHEXIDINE GLUCONATE 0.12 % MT SOLN
OROMUCOSAL | Status: AC
  Administered 2020-01-06: 15 mL via OROMUCOSAL
  Filled 2020-01-06: qty 15

## 2020-01-06 MED ORDER — METHOCARBAMOL 500 MG PO TABS: 500.0000 mg | ORAL_TABLET | Freq: Four times a day (QID) | ORAL | Status: DC | PRN

## 2020-01-06 MED ORDER — MENTHOL 3 MG MT LOZG: 1.0000 | LOZENGE | OROMUCOSAL | Status: DC | PRN

## 2020-01-06 MED ORDER — PROPOFOL 10 MG/ML IV BOLUS
INTRAVENOUS | Status: AC
  Filled 2020-01-06: qty 20

## 2020-01-06 MED ORDER — PHENYLEPHRINE HCL-NACL 10-0.9 MG/250ML-% IV SOLN
INTRAVENOUS | Status: DC | PRN
  Administered 2020-01-06: 30 ug/min via INTRAVENOUS

## 2020-01-06 MED ORDER — TRAMADOL HCL 50 MG PO TABS: 50.0000 mg | ORAL_TABLET | Freq: Four times a day (QID) | ORAL | Status: DC | PRN

## 2020-01-06 MED ORDER — HYDROCODONE-ACETAMINOPHEN 7.5-325 MG PO TABS: 1.0000 | ORAL_TABLET | ORAL | Status: DC | PRN

## 2020-01-06 MED ORDER — TRANEXAMIC ACID-NACL 1000-0.7 MG/100ML-% IV SOLN
INTRAVENOUS | Status: AC
  Filled 2020-01-06: qty 100

## 2020-01-06 MED ORDER — ACETAMINOPHEN 500 MG PO TABS
ORAL_TABLET | ORAL | Status: AC
  Administered 2020-01-06: 1000 mg via ORAL
  Filled 2020-01-06: qty 2

## 2020-01-06 MED ORDER — FENTANYL CITRATE (PF) 250 MCG/5ML IJ SOLN
INTRAMUSCULAR | Status: AC
  Filled 2020-01-06: qty 5

## 2020-01-06 MED ORDER — ACETAMINOPHEN 325 MG PO TABS: 325.0000 mg | ORAL_TABLET | Freq: Four times a day (QID) | ORAL | Status: DC | PRN

## 2020-01-06 MED ORDER — METHOCARBAMOL 1000 MG/10ML IJ SOLN
500.0000 mg | Freq: Four times a day (QID) | INTRAVENOUS | Status: DC | PRN
  Filled 2020-01-06: qty 5

## 2020-01-06 MED ORDER — ROCURONIUM BROMIDE 10 MG/ML (PF) SYRINGE
PREFILLED_SYRINGE | INTRAVENOUS | Status: AC
  Filled 2020-01-06: qty 10

## 2020-01-06 SURGICAL SUPPLY — 64 items
ALCOHOL 70% 16 OZ (MISCELLANEOUS) ×3 IMPLANT
BIT DRILL 5/64X5 DISP (BIT) IMPLANT
BLADE SAG 18X100X1.27 (BLADE) ×3 IMPLANT
CEMENT BONE R 1X40 (Cement) IMPLANT
CEMENT BONE REFOBACIN R1X40 US (Cement) ×3 IMPLANT
CLOSURE WOUND 1/2 X4 (GAUZE/BANDAGES/DRESSINGS) ×1
CNTNR URN SCR LID CUP LEK RST (MISCELLANEOUS) ×2 IMPLANT
CONT SPEC 4OZ STRL OR WHT (MISCELLANEOUS) ×6
COVER SURGICAL LIGHT HANDLE (MISCELLANEOUS) ×3 IMPLANT
COVER WAND RF STERILE (DRAPES) ×3 IMPLANT
DRAPE INCISE IOBAN 66X45 STRL (DRAPES) ×3 IMPLANT
DRAPE ORTHO SPLIT 77X108 STRL (DRAPES) ×6
DRAPE SURG ORHT 6 SPLT 77X108 (DRAPES) ×2 IMPLANT
DRAPE U-SHAPE 47X51 STRL (DRAPES) ×3 IMPLANT
DRSG ADAPTIC 3X8 NADH LF (GAUZE/BANDAGES/DRESSINGS) ×3 IMPLANT
DRSG PAD ABDOMINAL 8X10 ST (GAUZE/BANDAGES/DRESSINGS) ×3 IMPLANT
DURAPREP 26ML APPLICATOR (WOUND CARE) ×3 IMPLANT
ELECT BLADE 4.0 EZ CLEAN MEGAD (MISCELLANEOUS) ×3
ELECT REM PT RETURN 9FT ADLT (ELECTROSURGICAL) ×3
ELECTRODE BLDE 4.0 EZ CLN MEGD (MISCELLANEOUS) ×1 IMPLANT
ELECTRODE REM PT RTRN 9FT ADLT (ELECTROSURGICAL) ×1 IMPLANT
EXTRACTOR BROKEN SCREW 4 (INSTRUMENTS) ×3 IMPLANT
EXTRACTOR BROKEN SCREW 8X05 (INSTRUMENTS) ×3 IMPLANT
GAUZE SPONGE 4X4 12PLY STRL (GAUZE/BANDAGES/DRESSINGS) ×3 IMPLANT
GLOVE BIO SURGEON STRL SZ7.5 (GLOVE) ×3 IMPLANT
GLOVE BIOGEL PI IND STRL 8 (GLOVE) ×1 IMPLANT
GLOVE BIOGEL PI INDICATOR 8 (GLOVE) ×2
GOWN STRL REUS W/ TWL LRG LVL3 (GOWN DISPOSABLE) IMPLANT
GOWN STRL REUS W/ TWL XL LVL3 (GOWN DISPOSABLE) ×2 IMPLANT
GOWN STRL REUS W/TWL LRG LVL3 (GOWN DISPOSABLE)
GOWN STRL REUS W/TWL XL LVL3 (GOWN DISPOSABLE) ×6
HANDPIECE INTERPULSE COAX TIP (DISPOSABLE) ×3
KIT BASIN OR (CUSTOM PROCEDURE TRAY) ×3 IMPLANT
KIT SHOULDER SPACER 7MM STEM (Shoulder) ×3 IMPLANT
KIT TURNOVER KIT B (KITS) ×3 IMPLANT
MANIFOLD NEPTUNE II (INSTRUMENTS) ×3 IMPLANT
NEEDLE TAPERED W/ NITINOL LOOP (MISCELLANEOUS) ×3 IMPLANT
NS IRRIG 1000ML POUR BTL (IV SOLUTION) ×3 IMPLANT
PACK SHOULDER (CUSTOM PROCEDURE TRAY) ×3 IMPLANT
PAD ARMBOARD 7.5X6 YLW CONV (MISCELLANEOUS) ×6 IMPLANT
RESTRAINT HEAD UNIVERSAL NS (MISCELLANEOUS) ×3 IMPLANT
SET HNDPC FAN SPRY TIP SCT (DISPOSABLE) ×1 IMPLANT
SLING ARM FOAM STRAP LRG (SOFTGOODS) ×3 IMPLANT
SLING ARM FOAM STRAP MED (SOFTGOODS) IMPLANT
SMARTMIX MINI TOWER (MISCELLANEOUS)
SPONGE LAP 18X18 RF (DISPOSABLE) ×3 IMPLANT
SPONGE LAP 4X18 RFD (DISPOSABLE) IMPLANT
STRIP CLOSURE SKIN 1/2X4 (GAUZE/BANDAGES/DRESSINGS) ×2 IMPLANT
SUCTION FRAZIER HANDLE 10FR (MISCELLANEOUS) ×3
SUCTION TUBE FRAZIER 10FR DISP (MISCELLANEOUS) ×1 IMPLANT
SUT ETHILON 2 0 PSLX (SUTURE) ×3 IMPLANT
SUT FIBERWIRE #2 38 T-5 BLUE (SUTURE) ×6
SUT MNCRL AB 4-0 PS2 18 (SUTURE) ×3 IMPLANT
SUT MON AB 2-0 CT1 36 (SUTURE) ×6 IMPLANT
SUT PDS AB 1 CT1 36 (SUTURE) ×3 IMPLANT
SUT VIC AB 2-0 CT1 27 (SUTURE) ×3
SUT VIC AB 2-0 CT1 TAPERPNT 27 (SUTURE) ×1 IMPLANT
SUTURE FIBERWR #2 38 T-5 BLUE (SUTURE) ×2 IMPLANT
TAPE CLOTH SURG 6X10 WHT LF (GAUZE/BANDAGES/DRESSINGS) ×3 IMPLANT
TOWEL GREEN STERILE (TOWEL DISPOSABLE) ×3 IMPLANT
TOWER CARTRIDGE SMART MIX (DISPOSABLE) ×3 IMPLANT
TOWER SMARTMIX MINI (MISCELLANEOUS) IMPLANT
WATER STERILE IRR 1000ML POUR (IV SOLUTION) ×3 IMPLANT
YANKAUER SUCT BULB TIP NO VENT (SUCTIONS) ×3 IMPLANT

## 2020-01-06 NOTE — Op Note (Signed)
Date of Surgery: 01/06/2020  INDICATIONS: Geoffrey West is a 64 y.o.-year-old male with a left prosthetic shoulder chronic infection. He has a very long and complicated surgical history with this left shoulder. This includes primary, anatomic arthroplasty performed greater than 12 years ago in [facility]. He had subsequent rotator cuff tear with procedure and a revision surgery in [place]. 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 With that revision he was noted to have infection in the shoulder. The length of that infection is undetermined based on no previous cultures with his other surgeries. Since that time he has had a complicated history of the left shoulder at best. He has had multiple revisions and subsequent infections that have ultimately been treated with a congruent articular surface hemiarthroplasty. He is here today for increased pain and swelling as well as a plantar markers concerning for persistence of the infection. We have discussed explantation of the metal hardware with replacement utilizing a antibiotic spacer.;  The patient did consent to the procedure after discussion of the risks and benefits.  PREOPERATIVE DIAGNOSIS:  1. Chronic infection of left prosthetic shoulder  POSTOPERATIVE DIAGNOSIS: Same.  PROCEDURE:  1. Explantation of left shoulder arthroplasty, 2 components. (Humeral head and humeral stem). 2. Removal of deep orthopedic hardware. 1 screw from the glenoid vault. 3. Placement of antibiotic spacer of left humerus. 4. Excisional and nonexcisional irrigation and debridement of left shoulder including skin, subcutaneous tissue, muscle, and bone.  SURGEON: Geralynn Rile, M.D.  ASSIST: Katy Apo, RNFA.[name]  ANESTHESIA:  General and left interscalene block  IV FLUIDS AND URINE: See anesthesia.  ESTIMATED BLOOD LOSS: 150 mL.  IMPLANTS:  [employer]  large antibiotic spacer with a 46 mm humeral head and 11 mm stem The stem was premade with gentamicin impregnation. 40 g bag of antibiotic cement with gentamicin and 1 g of vancomycin.   Explants:  CTA head with humeral stem left shoulder 1 threaded screw without head from the glenoid vault  DRAINS: None  COMPLICATIONS: None.  DESCRIPTION OF PROCEDURE: The patient was brought to the operating room and placed supine on the operating table.  The patient had been signed prior to the procedure and this was documented. The patient had the anesthesia placed by the anesthesiologist.  A time-out was performed to confirm that this was the correct patient, site, side and location. The patient did receive antibiotics prior to the incision and was re-dosed during the procedure as needed at indicated intervals. We then placed the patient into the beachchair position. Care was taken to keep the neck in neutral alignment. The patient had the operative extremity prepped and draped in the standard surgical fashion.      We began the procedure by marking out the previously established deltopectoral incision. We then developed this incision with medial and lateral flaps. There was some scarring and fibrosis encountered into the deltopectoral interval consistent with previous surgical approaches. We did encounter 2 Ethibond sutures within the interval. As soon as we got deep to the deltopectoral fascia we did encounter abundant fluid rush with some questionable purulence. This was a cottage cheese appearing exudate. We extended the interval both proximally and distally. We then encountered abundant metallosis within the deep surface of the deltoid and pectoralis. At this juncture we sent both superficial and deep cultures.  Next, we worked to explant the humeral stem. We utilized a Dispensing optician and extraction  device with a tamp to disengage the [facility] taper from the humeral head and the stem. We then passed this off  the back table. Of note, there was abundant exudate on the undersurface of the humeral head. This had a gelatinous appearance but no foul odor. We then utilized flexible osteotomes and chisels to liberate the metaphyseal porous coating of the humeral stem from the proximal humerus. This was accomplished with tedious care to avoid fracturing the humerus. A small nondisplaced fracture was encountered on the lateral aspect just at the junction of the malunited greater tuberosity in the proximal metaphysis. This did not propagate distally and did not violate the canal. We then were able to remove the stem with just minimal bone on the stem by hand. This was passed off the back table.  Then, we moved to the glenoid. Deep retractors were placed. We then encountered more metallosis and fibrinous exudate. We utilized rondure to remove this so that we could access the glenoid. Once we were able to see the glenoid face we realized that there was an exposed broken screw. This had previously been covered by bone. Over time this had eroded down to the screw itself. We then used the trephinated drill bits to extract this headless screw. This was done successfully with no bone loss appreciated other than the socket from the screw itself.  We then moved to our incisional and excisional debridement of the shoulder. Utilizing rondure and knife we sharply excised any nonviable muscle, skin, subcutaneous tissue, and bone. This was done in all areas of myelosis as well as any questionable areas of purulent material. This did allow healthy bleeding tissue at the conclusion of the excisional debridement. We then utilized a curette and reverse curettes for the humeral canal. We removed all nonviable tissue. Likewise we did this for the glenoid. Next we copiously irrigated the wound with one 3 L bag of bacitracin normal saline and then a second 3 L bag of normal saline. Then lavaged one final time with 2 L of normal saline.  Lastly,  we moved to place the antibiotic stem. We trialed the 41 mm head as well as the 46 mm head. We found that the 46 mm head was the best fitting. Both of these, however did come up a bit short on humeral head height. Unfortunately, with the antibiotic spacer there is no way to account for the height offset and have a stable spacer. Therefore, we excepted the shortened position due to its ability to provide a stable head. We then mixed on the back table a single 40 g bag of gentamicin impregnated cement. We added 1 g of vancomycin to that. We then placed a metaphyseal putty of that cement around the stem and the undersurface of the prosthetic head. We then placed the stem into the canal. This was allowed to cure not approximately 30 degrees of retroversion. Once this had cured we reduced the shoulder and found it to be still a bit short but did allow for generous passive range of motion with no block to motion. There was no loosening appreciated of that stem.  We then irrigated the wound once again with normal saline. We then closed in layers. We developed medial and lateral flaps to allow for closure of the deltopectoral interval with a #1 PDS. Next, 2-0 Vicryl was used for the deep dermal tissue. We finally utilized 2-0 nylon in interrupted horizontal mattress fashion to evert the edges of the skin to allow adequate healing.  The arm was cleaned and dried and sterile bandages was applied. The arm was placed in a sling. All counts were correct x2. There were no noted intraoperative complications. Patient was transported to PACU in stable condition.  POSTOPERATIVE PLAN:  Postoperatively, Geoffrey West will be given IV antibiotics and monitored for postoperative pain control. He will be nonweightbearing to the left upper extremity and in his sling for approximately 2 weeks. He will be discharged home with follow-up with me in 2 weeks. We will follow up on cultures and update infectious disease team as data comes  back. He does also have follow-up with infectious disease clinic in 2 weeks.

## 2020-01-06 NOTE — Telephone Encounter (Signed)
Reschedule next 2-3 weeks thanks

## 2020-01-06 NOTE — Anesthesia Postprocedure Evaluation (Signed)
Anesthesia Post Note  Patient: Geoffrey West [facility]  Procedure(s) Performed: LEFT SHOULDER ARTHROPLASTY EXPLANT WITH ANTIBIOTIC SPACER PLACEMENT (Left Shoulder)     Patient location during evaluation: PACU Anesthesia Type: General Level of consciousness: awake and alert, oriented and awake Pain management: pain level controlled Vital Signs Assessment: post-procedure vital signs reviewed and stable Respiratory status: spontaneous breathing, nonlabored ventilation and respiratory function stable Cardiovascular status: blood pressure returned to baseline and stable Postop Assessment: no apparent nausea or vomiting Anesthetic complications: no   No complications documented.  Last Vitals:  Vitals:   01/06/20 1718 01/06/20 1732  BP: 105/74 121/85  Pulse: 77 79  Resp: 20 20  Temp: 36.7 C   SpO2: 100% 100%    Last Pain:  Vitals:   01/06/20 1732  PainSc: 0-No pain                 Catalina Gravel

## 2020-01-06 NOTE — H&P (Signed)
ORTHOPAEDIC H and P  REQUESTING PHYSICIAN: [name], MD  PCP:  Aurea Graff, PA-C (Inactive)  Chief Complaint: Left shoulder chronic infection  HPI: Geoffrey West is a 64 y.o. male who complains of increasing left shoulder pain and swelling since coming off of his chronic oral suppressive antibiotics.  He has a known, and longstanding infected left shoulder prosthesis.  He is been managing pretty well for the last 1 year with a hemiarthroplasty and oral antibiotics.  However, his function has been waxing and waning, and he has bad increasing episodes of pain.  He is here today for explantation and antibiotic spacer placement as a temporizing procedure and potentially definitive management for this unfortunate episode.  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   Rash 09/17/2019   Situational depression    "recently lost my mom and brother" (03/06/2018)   Wears contact lenses    Wears glasses    Past Surgical History:  Procedure Laterality Date   BACK SURGERY     COLONOSCOPY     COLONOSCOPY W/ BIOPSIES AND POLYPECTOMY     EXCISIONAL TOTAL SHOULDER ARTHROPLASTY WITH ANTIBIOTIC SPACER Left 03/06/2018   Procedure: Left shoulder antibiotic spacer expalnt with glenoid bone grafting, antiobiotic spacer placement;  Surgeon: [name], MD;  Location: [place];  Service: Orthopedics;  Laterality: Left;   IR FLUORO GUIDE CV LINE RIGHT   12/26/2016   KNEE ARTHROSCOPY Bilateral    X 10 total arthroscopies   KNEE CARTILAGE SURGERY Left 1977   "cartilage removed"   LASIK Bilateral    LUMBAR DISC SURGERY  2000s   "ruptured disc"   REVERSE SHOULDER ARTHROPLASTY Left 12/08/2016   Procedure: Revision left total shoulder to reverse total shoulder arthroplasty;  Surgeon: [name], MD;  Location: [place];  Service: Orthopedics;  Laterality: Left;   REVERSE SHOULDER ARTHROPLASTY Left 01/01/2018   Procedure: Left reverse shouler explant with antibiotic spacer placement;  Surgeon: [name], MD;  Location: [facility];  Service: Orthopedics;  Laterality: Left;  2.5 hrs   REVERSE SHOULDER ARTHROPLASTY Left 06/11/2018   Procedure: REVERSE TOTAL SHOULDER REVISION;  Surgeon: [name], MD;  Location: [place];  Service: Orthopedics;  Laterality: Left;   REVERSE SHOULDER ARTHROPLASTY Left 12/05/2018   Procedure: Left reverse shoulder explant, bone grafting;  Surgeon: [name], MD;  Location: [place];  Service: Orthopedics;  Laterality: Left;  2.5 hrs   REVISION OF TOTAL SHOULDER Left 12/26/2018   REVISION TOTAL SHOULDER TO REVERSE TOTAL SHOULDER Left 07/16/2018   Procedure: Left shoulder irrigation and debridement with poly and head ball exchange;  Surgeon: [name], MD;  Location: [place];  Service: Orthopedics;  Laterality: Left;  2 hrs   SHOULDER ARTHROSCOPY Right 1980s/1990s   "bone spur removed"   SHOULDER OPEN ROTATOR CUFF REPAIR Right 1970s   SHOULDER SURGERY Left 03/06/2018  antibiotic spacer expalnt with glenoid bone grafting, antiobiotic spacer placement   TOTAL KNEE ARTHROPLASTY Bilateral    TOTAL SHOULDER ARTHROPLASTY Left ~ 2014-2017 X 2   "in [place]"   [place] Right ~ 2009   "in [place]"   [place] Left 12/26/2018   Procedure: Left prosthetic shoulder 1 component revision;  Surgeon: [name], MD;  Location: [place];   Service: Orthopedics;  Laterality: Left;  90 mins   WISDOM TOOTH EXTRACTION     Social History   Socioeconomic History   Marital status: Single    Spouse name: Not on file   Number of children: Not on file   Years of education: Not on file   Highest education level: Not on file  Occupational History   Not on file  Tobacco Use   Smoking status: Never Smoker   Smokeless tobacco: Never Used  Vaping Use   Vaping Use: Never used  Substance and Sexual Activity   Alcohol use: Not Currently   Drug use: Never   Sexual activity: Not Currently  Other Topics Concern   Not on file  Social History Narrative   Not on file   Social Determinants of Health   Financial Resource Strain:    Difficulty of Paying Living Expenses:   Food Insecurity:    Worried About Charity fundraiser in the Last Year:    Arboriculturist in the Last Year:   Transportation Needs:    Film/video editor (Medical):    Lack of Transportation (Non-Medical):   Physical Activity:    Days of Exercise per Week:    Minutes of Exercise per Session:   Stress:    Feeling of Stress :   Social Connections:    Frequency of Communication with Friends and Family:    Frequency of Social Gatherings with Friends and Family:    Attends Religious Services:    Active Member of Clubs or Organizations:    Attends Music therapist:    Marital Status:    Family History  Problem Relation Age of Onset   Arthritis Mother    Arthritis Sister    Alzheimer's disease Brother    Arthritis Brother    Allergies  Allergen Reactions   Fentanyl Shortness Of Breath and Other (See Comments)    Reaction to [facility] ONLY > ? DOSE REGULATION ?    Oxycodone Other (See Comments)    Pt states he goes through hot and cold flashes withdrawal like symptoms   Prior to Admission medications   Medication Sig Start Date End Date Taking? Authorizing Provider  allopurinol (ZYLOPRIM) 300 MG tablet  Take 300 mg by mouth daily.   Yes [provider]  amLODipine (NORVASC) 10 MG tablet Take 10 mg by mouth at bedtime.    Yes [provider]  anastrozole (ARIMIDEX) 1 MG tablet Take 0.5 mg by mouth every Thursday.  08/10/18  Yes [provider]  ARMOUR THYROID 30 MG tablet Take 30 mg by mouth daily before breakfast.  09/22/19  Yes [provider]  clonazePAM (KLONOPIN) 1 MG tablet Take 0.5-1 mg by mouth daily as needed (anxiety/panic attacks).    Yes [provider]  Coenzyme Q10 (COQ10) 200 MG CAPS Take 200 mg by mouth daily.    Yes [provider]  diclofenac (VOLTAREN) 75 MG EC tablet Take 75 mg by mouth in the morning and at bedtime.   Yes [provider]  fluticasone Asencion Islam)  50 MCG/ACT nasal spray Place 2 sprays into both nostrils daily as needed for allergies or rhinitis.   Yes [provider]  Multiple Vitamin (MULTIVITAMIN WITH MINERALS) TABS tablet Take 1 tablet by mouth daily.   Yes [provider]  pantoprazole (PROTONIX) 40 MG tablet Take 40 mg by mouth every morning.    Yes [provider]  propranolol ER (INDERAL LA) 80 MG 24 hr capsule Take 80 mg by mouth every evening.  02/01/18  Yes [provider]  tacrolimus (PROTOPIC) 0.1 % ointment Apply 1 application topically at bedtime. 12/18/19  Yes [provider]  testosterone cypionate (DEPOTESTOSTERONE CYPIONATE) 200 MG/ML injection Inject 100 mg into the muscle every Thursday.    Yes [provider]  linezolid (ZYVOX) 600 MG tablet Take 1 tablet (600 mg total) by mouth 2 (two) times daily. Patient not taking: Reported on 12/25/2019 11/18/19   Campbell Riches, MD   No results found.  Positive ROS: All other systems have been reviewed and were otherwise negative with the exception of those mentioned in the HPI and as above.  Physical Exam: General: Alert, no acute distress Cardiovascular: No pedal edema Respiratory: No  cyanosis, no use of accessory musculature GI: No organomegaly, abdomen is soft and non-tender Skin: No lesions in the area of chief complaint Neurologic: Sensation intact distally Psychiatric: Patient is competent for consent with normal mood and affect Lymphatic: No axillary or cervical lymphadenopathy  MUSCULOSKELETAL:  Left upper extremity:  Deltopectoral incision is intact.  He has a healed scar there he has erythema throughout the left chest wall and shoulder consistent with his granuloma annual RA.  Assessment: 1.  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 2.  Left chest wall rash, granuloma and URI.  Plan: -Our plan will be to proceed today with explantation of the hemiarthroplasty and hopefully of the retained screw in the glenoid.  We will then perform aggressive debridement and send cultures.  We will also convert him to an antibiotic spacer.  This will be something that we will potentially leave in place indefinitely.  We will then be admitted postoperatively for monitoring and routine care.  -We again discussed the risk and benefits of this procedure.  All questions were solicited and answered to his satisfaction.  We specifically discussed the risk of dysfunction as well as persistence of the infection and need for chronic antibiotics.  We discussed the risk of damage to surrounding neurovascular structures, weakness, stiffness, and need for further surgery.  He has provided informed consent.    [name], MD Cell [phone]    01/06/2020 2:04 PM

## 2020-01-06 NOTE — Anesthesia Procedure Notes (Signed)
Anesthesia Regional Block: Interscalene brachial plexus block   Pre-Anesthetic Checklist: ,, timeout performed, Correct Patient, Correct Site, Correct Laterality, Correct Procedure, Correct Position, site marked, Risks and benefits discussed,  Surgical consent,  Pre-op evaluation,  At surgeon's request and post-op pain management  Laterality: Left  Prep: chloraprep       Needles:  Injection technique: Single-shot  Needle Type: Echogenic Stimulator Needle     Needle Length: 5cm  Needle Gauge: 22     Additional Needles:   Procedures:,,,, ultrasound used (permanent image in chart),,,,  Narrative:  Start time: 01/06/2020 1:25 PM End time: 01/06/2020 1:35 PM Injection made incrementally with aspirations every 5 mL.  Performed by: Personally  Anesthesiologist: Catalina Gravel, MD  Additional Notes: Functioning IV was confirmed and monitors were applied.  A 49mm 22ga Arrow echogenic stimulator needle was used. Sterile prep and drape, hand hygiene, and sterile gloves were used.  Negative aspiration and negative test dose prior to incremental administration of local anesthetic. The patient tolerated the procedure well.  Ultrasound guidance: relevent anatomy identified, needle position confirmed, local anesthetic spread visualized around nerve(s), vascular puncture avoided.  Image printed for medical record.

## 2020-01-06 NOTE — Brief Op Note (Signed)
01/06/2020  5:11 PM  PATIENT:  Geoffrey West  64 y.o. male  PRE-OPERATIVE DIAGNOSIS:  Left shoulder chronic prosthesis infection  POST-OPERATIVE DIAGNOSIS:  Left shoulder chronic prosthesis infection  PROCEDURE:  Procedure(s) with comments: 1. LEFT SHOULDER ARTHROPLASTY EXPLANT, 2 components (humeral head and humeral stem) 2. Left shoulder removal of deep implant.  ( broken screw from glenoid vault) 3.  Left shoulder implantation of antibiotic spacer stem  (Left) - 2.5 hrs RNFA  SURGEON:  Surgeon(s) and Role:    * Traeson Dusza, Elly Modena, MD - Primary  PHYSICIAN ASSISTANT: none  ASSISTANTS: Katy Apo, RNFA   ANESTHESIA:   regional and general  EBL:  150 mL   BLOOD ADMINISTERED:none  DRAINS: none   LOCAL MEDICATIONS USED:  NONE  SPECIMEN:  Source of Specimen:  left shoulder deep tissue and Aspirate  DISPOSITION OF SPECIMEN:  pathology for tissue and micro lab for fluid  COUNTS:  YES  TOURNIQUET:  * No tourniquets in log *  DICTATION: .Note written in EPIC  PLAN OF CARE: Admit to inpatient   PATIENT DISPOSITION:  PACU - hemodynamically stable.   Delay start of Pharmacological VTE agent (>24hrs) due to surgical blood loss or risk of bleeding: not applicable

## 2020-01-06 NOTE — Anesthesia Preprocedure Evaluation (Signed)
Anesthesia Evaluation  Patient identified by MRN, date of birth, ID band Patient awake    Reviewed: Allergy & Precautions, NPO status , Patient's Chart, lab work & pertinent test results, reviewed documented beta blocker date and time   Airway Mallampati: III  TM Distance: >3 FB Neck ROM: Full    Dental  (+) Teeth Intact, Dental Advisory Given   Pulmonary neg pulmonary ROS,    Pulmonary exam normal breath sounds clear to auscultation       Cardiovascular hypertension, Pt. on home beta blockers and Pt. on medications Normal cardiovascular exam Rhythm:Regular Rate:Normal     Neuro/Psych  Headaches, PSYCHIATRIC DISORDERS Anxiety Depression  Neuromuscular disease    GI/Hepatic Neg liver ROS, GERD  Medicated and Controlled,  Endo/Other  negative endocrine ROS  Renal/GU negative Renal ROS     Musculoskeletal  (+) Arthritis , Prosthetic shoulder infection   Abdominal   Peds  Hematology negative hematology ROS (+)   Anesthesia Other Findings Day of surgery medications reviewed with the patient.  Reproductive/Obstetrics                             Anesthesia Physical Anesthesia Plan  ASA: II  Anesthesia Plan: General   Post-op Pain Management:  Regional for Post-op pain   Induction: Intravenous  PONV Risk Score and Plan: 2 and Midazolam, Dexamethasone and Ondansetron  Airway Management Planned: Oral ETT  Additional Equipment:   Intra-op Plan:   Post-operative Plan: Extubation in OR  Informed Consent: I have reviewed the patients History and Physical, chart, labs and discussed the procedure including the risks, benefits and alternatives for the proposed anesthesia with the patient or authorized representative who has indicated his/her understanding and acceptance.     Dental advisory given  Plan Discussed with: CRNA  Anesthesia Plan Comments:         Anesthesia Quick  Evaluation

## 2020-01-06 NOTE — Telephone Encounter (Signed)
Patient called office regarding upcoming appointment for 7/16. States that he is scheduled for shoulder surgery tomorrow and might not be able to make it to his appointment. Would like to know if he will need to reschedule or keep appointment Aundria Rud, [place]

## 2020-01-06 NOTE — Anesthesia Procedure Notes (Addendum)
Procedure Name: Intubation Date/Time: 01/06/2020 2:18 PM Performed by: Malachi Paradise, RN Pre-anesthesia Checklist: Patient identified, Emergency Drugs available, Suction available and Patient being monitored Patient Re-evaluated:Patient Re-evaluated prior to induction Oxygen Delivery Method: Circle System Utilized Preoxygenation: Pre-oxygenation with 100% oxygen Induction Type: IV induction Ventilation: Mask ventilation without difficulty Laryngoscope Size: Mac and 4 Grade View: Grade I Tube type: Oral Tube size: 7.0 mm Number of attempts: 1 Airway Equipment and Method: Stylet and Oral airway Placement Confirmation: ETT inserted through vocal cords under direct vision,  positive ETCO2 and breath sounds checked- equal and bilateral Secured at: 22 cm Tube secured with: Tape Dental Injury: Teeth and Oropharynx as per pre-operative assessment

## 2020-01-06 NOTE — Transfer of Care (Signed)
Immediate Anesthesia Transfer of Care Note  Patient: Geoffrey West [facility]  Procedure(s) Performed: LEFT SHOULDER ARTHROPLASTY EXPLANT WITH ANTIBIOTIC SPACER PLACEMENT (Left Shoulder)  Patient Location: PACU  Anesthesia Type:GA combined with regional for post-op pain  Level of Consciousness: drowsy  Airway & Oxygen Therapy: Patient connected to face mask oxygen  Post-op Assessment: Report given to RN and Post -op Vital signs reviewed and stable  Post vital signs: Reviewed and stable  Last Vitals:  Vitals Value Taken Time  BP 105/74 01/06/20 1718  Temp    Pulse 77 01/06/20 1720  Resp 18 01/06/20 1720  SpO2 97 % 01/06/20 1720  Vitals shown include unvalidated device data.  Last Pain:  Vitals:   01/06/20 1718  PainSc: (P) Asleep         Complications: No complications documented.

## 2020-01-07 ENCOUNTER — Encounter (HOSPITAL_COMMUNITY): Payer: Self-pay | Admitting: Orthopedic Surgery

## 2020-01-07 DIAGNOSIS — T8450XA Infection and inflammatory reaction due to unspecified internal joint prosthesis, initial encounter: Secondary | ICD-10-CM | POA: Diagnosis not present

## 2020-01-07 LAB — CBC
HCT: 41.1 % (ref 39.0–52.0)
Hemoglobin: 13.3 g/dL (ref 13.0–17.0)
MCH: 29.3 pg (ref 26.0–34.0)
MCHC: 32.4 g/dL (ref 30.0–36.0)
MCV: 90.5 fL (ref 80.0–100.0)
Platelets: 249 10*3/uL (ref 150–400)
RBC: 4.54 MIL/uL (ref 4.22–5.81)
RDW: 15.6 % — ABNORMAL HIGH (ref 11.5–15.5)
WBC: 9.3 10*3/uL (ref 4.0–10.5)
nRBC: 0 % (ref 0.0–0.2)

## 2020-01-07 LAB — BASIC METABOLIC PANEL
Anion gap: 10 (ref 5–15)
BUN: 20 mg/dL (ref 8–23)
CO2: 25 mmol/L (ref 22–32)
Calcium: 8.7 mg/dL — ABNORMAL LOW (ref 8.9–10.3)
Chloride: 100 mmol/L (ref 98–111)
Creatinine, Ser: 1.52 mg/dL — ABNORMAL HIGH (ref 0.61–1.24)
GFR calc Af Amer: 56 mL/min — ABNORMAL LOW (ref >60)
GFR calc non Af Amer: 48 mL/min — ABNORMAL LOW (ref >60)
Glucose, Bld: 166 mg/dL — ABNORMAL HIGH (ref 70–99)
Potassium: 4 mmol/L (ref 3.5–5.1)
Sodium: 135 mmol/L (ref 135–145)

## 2020-01-07 NOTE — Discharge Instructions (Signed)
-  Okay to remove left shoulder bandage tomorrow morning.  You may shower at that time.  Do not submerge underwater.  You will then pat your incision dry, and then apply the Aquacel dressing.  The Aquacel dressing will remain in place for 2 weeks.  I will remove this in the office at your follow-up appointment.  -No movement through the shoulder.  No weightbearing through the shoulder.  Maintain sling for 2 weeks.  You may move your hand, wrist, and elbow as tolerated.  -Apply ice to the left shoulder for 30 minutes out of each hour that you are awake.  -For mild to moderate pain use Tylenol and your diclofenac around-the-clock.  For breakthrough pain use tramadol as necessary.  -Take your previously prescribed Keflex by mouth for the next 7 days.

## 2020-01-07 NOTE — Progress Notes (Signed)
° °  Subjective:  Patient reports pain as mild.  Feels good this afternoon.  He feels as though the nerve block has resolved, and his pain is well controlled.  No chest pain.  No fevers, night sweats or chills.  Objective:   VITALS:   Vitals:   01/07/20 0002 01/07/20 0622 01/07/20 0850 01/07/20 1338  BP: 112/85 (!) 136/95 (!) 134/97 (!) 134/94  Pulse: 79 72 75 76  Resp: 18 16 17 17   Temp: 98.1 F (36.7 C) 97.6 F (36.4 C) 97.9 F (36.6 C) 98.6 F (37 C)  TempSrc: Oral Oral Oral Oral  SpO2: 93% 98% 98% 100%  Weight:      Height:        Neurologically intact Neurovascular intact Sensation intact distally Intact pulses distally No cellulitis present Compartment soft Sling in place. Dressing is clean, dry, and intact.  Lab Results  Component Value Date   WBC 9.3 01/07/2020   HGB 13.3 01/07/2020   HCT 41.1 01/07/2020   MCV 90.5 01/07/2020   PLT 249 01/07/2020   BMET    Component Value Date/Time   NA 135 01/07/2020 0140   K 4.0 01/07/2020 0140   CL 100 01/07/2020 0140   CO2 25 01/07/2020 0140   GLUCOSE 166 (H) 01/07/2020 0140   BUN 20 01/07/2020 0140   CREATININE 1.52 (H) 01/07/2020 0140   CREATININE 1.38 (H) 11/07/2019 1140   CALCIUM 8.7 (L) 01/07/2020 0140   GFRNONAA 48 (L) 01/07/2020 0140   GFRNONAA 57 (L) 05/21/2019 0918   GFRAA 56 (L) 01/07/2020 0140   GFRAA 66 05/21/2019 0918     Assessment/Plan: 1 Day Post-Op   Active Problems:   Infection associated with prosthesis of left shoulder joint (HCC)   History of removal of joint prosthesis of left shoulder due to infection   Advance diet -Sling to left upper extremity and nonweightbearing.  No motion to the shoulder but can move elbow, hand and wrist as tolerated.  -We will plan for discharge home today.  He will follow-up with me in the office in 2 weeks.  He will have dressing change at home to an Aquacel dressing on tomorrow morning.   [name] 01/07/2020, 5:07 PM   Geralynn Rile,  MD [phone]

## 2020-01-07 NOTE — Progress Notes (Signed)
Discharge instructions addressed; Pt in stable condition; Pt picked up by family friend at the [employer] entrance.

## 2020-01-08 LAB — SURGICAL PATHOLOGY

## 2020-01-09 ENCOUNTER — Ambulatory Visit (INDEPENDENT_AMBULATORY_CARE_PROVIDER_SITE_OTHER): Payer: PPO | Admitting: Infectious Diseases

## 2020-01-09 ENCOUNTER — Other Ambulatory Visit: Payer: Self-pay

## 2020-01-09 ENCOUNTER — Encounter: Payer: Self-pay | Admitting: Infectious Diseases

## 2020-01-09 DIAGNOSIS — R21 Rash and other nonspecific skin eruption: Secondary | ICD-10-CM | POA: Diagnosis not present

## 2020-01-09 DIAGNOSIS — T8459XA Infection and inflammatory reaction due to other internal joint prosthesis, initial encounter: Secondary | ICD-10-CM

## 2020-01-09 DIAGNOSIS — Z96612 Presence of left artificial shoulder joint: Secondary | ICD-10-CM

## 2020-01-09 NOTE — Progress Notes (Signed)
Subjective:    Patient ID: Geoffrey West, male    DOB: 03-08-1956, 64 y.o.   MRN: [id number]  HPI 64yo M withHTN, gout, low testosterone and failed shoulder arthroplasty. He was taken to OR by Dr. [name] on June 15th of 2018 and had shoulder revision and reversal of total shoulder arthroplasty.His Cx grewpropionobacterium and MSSE (pan-sens).   He was treated with ceftriaxone/rifampin then zyvox and rifampin. He was ultimately transitioned to augmentin to complete 6 months of therapy.  He returned to the [facility] underwent explantation of all components of his left reverse shoulder arthroplasty with insertion of antibiotic spacer in the left shoulder with open reduction internal internal fixation of a left proximal humerus fracture.There is a retained screw.  His Cx again grewPropionibacterium on all 4 specimens were sent. Patient was treated with ceftriaxone as an inpatient also had gotten vancomycin. He was ultimately discharged on oral Keflex 500 mg 4 times daily because he did not want have a PICC line again.  Unfortunately, he had to go back to OR and underwent explantation of his humeral antibiotic spacer, bone grafting of the left glenoid fossa bone defect excisional debridement of bone muscle subcutaneous tissue and placement of a left antibiotic spacer.Culturesdone in September on antibiotics failed to yield an organism. He ultimately was taken back to the operating room in [place] on oral Keflex and underwent plan of his humeral antibiotic spacer and reversal of his total shoulder revision.  He returnedto the operating room January 21, 2020and underwent excisional and non-excisional debridement of infected left shoulder prosthesis including skin subcutaneous tissue muscle and fascia. A reverse total shoulder revision was performed with polyethylene and glenosphere head ball of 2 components.His Cx were (-). He was treated with IV ceftriaxone then  transitioned to po keflex.  He had furthershoulder surgeries on January 04, 2019 he underwent a left reverse shoulder revision of one component and explantation of the glenosphere and placement of a CTA head, along with excisional debridement of left shoulder including pseudocapsule bone muscle and skin. Cx were (-).  He then went back to the operating room on July 2 and underwent revision of his left shoulder hemiarthroplasty with revision of the humeral head. There were seromas encountered intraoperatively but Dr. [name] did not feel that any of this looked purulent.  At his last visit, he was resumed on keflex due to shoulder pain and increased inflammatory markers (CRP 22). He believes he has been back on rx for 2- 2.5 weeks. Has given him some GI upset.  He had CT scan 07-2019 Marked sclerosis and concavity of the glenoid consistent with history of prior infection and degenerative change. As noted above, the superior and inferior rim of the glenoid are thinned. Retained screw fragment in the superior glenoid noted  He had arthroscopy 09-2019 which showed 19,236 WBC, Cx was (-).  He was seen 11-07-19: increasing erythema of shoulder, warmth. He had limitation of his ADLs. He had CRP 29.2. he was supposed to have CT scan to f/u. He had ortho 2nd opinion @ [facility] 11-11-19 they decided to defer repeat CT.He had repeat arthrocentesis that was no growth after 4 days (not held 14 days due to "contamination"). They discussed resection vs continued suppressive anbx.  He previously had notable erythema on his shoulder.  He was continued on keflex until 11-18-19 when he was changed to zyvox.  His shoulders (both) improved while on this until he then developed pain in his R shoulder.  He was  also scheduled for a repeat CT scan but told by pther surgeon he did not need it.   He was seen by derm for persistent erythema of his shoulder- bx  Light growth of P agglomerans.  He was seen by allergist, did  not think he has prosthetic allergy causing this.   Had shoulder arthroplasty 7-13. Had spacer placed, hardware removed. Fluid noted at time of surgery.  Path: A. SOFT TISSUE, LEFT SHOULDER, EXCISION:  - Fibrosis and granulation tissue with acute inflammation  Cx- Staph epi (sensi pending)  He is getting injections from derm for his skin erythema. He feels it is better.  Is able to move his shoulder some. Is on no pain rx.  Was given keflex for 1 week post op due to fluid, erythema (minimal).   Review of Systems  Constitutional: Negative for chills and fever.  Gastrointestinal: Negative for constipation and diarrhea.  Genitourinary: Negative for difficulty urinating.  Musculoskeletal: Negative for arthralgias and joint swelling.  Skin: Positive for rash.  Please see HPI. All other systems reviewed and negative.      Objective:   Physical Exam Vitals reviewed.  Constitutional:      Appearance: Normal appearance.  Eyes:     Extraocular Movements: Extraocular movements intact.  Cardiovascular:     Rate and Rhythm: Normal rate and regular rhythm.  Pulmonary:     Effort: Pulmonary effort is normal.     Breath sounds: Normal breath sounds.  Abdominal:     General: Bowel sounds are normal. There is no distension.     Palpations: Abdomen is soft.  Musculoskeletal:     Comments: L shoulder has dressing. No tenderness, no heat, no swelling. Rash unchanged.   Neurological:     Mental Status: He is alert.           Assessment & Plan:

## 2020-01-09 NOTE — Assessment & Plan Note (Addendum)
Appreciate derm f/u Appears stable today.  His Cx is listed as "tissue left shoulder" vs the skin from his rash?

## 2020-01-09 NOTE — Assessment & Plan Note (Signed)
His prosthesis has been removed.  He had fluid at time of surgery.  His path suggests some ongoing inflammation.  We discussed his Cx- did not show P agglomerans.  Await his Staph epi sensi- will him with results on 01-12-20.

## 2020-01-11 LAB — AEROBIC/ANAEROBIC CULTURE W GRAM STAIN (SURGICAL/DEEP WOUND)

## 2020-01-13 ENCOUNTER — Telehealth: Payer: Self-pay | Admitting: Infectious Diseases

## 2020-01-13 DIAGNOSIS — Z96612 Presence of left artificial shoulder joint: Secondary | ICD-10-CM

## 2020-01-13 MED ORDER — DOXYCYCLINE HYCLATE 100 MG PO TABS: 100.0000 mg | ORAL_TABLET | Freq: Two times a day (BID) | ORAL | 0 refills | Status: DC

## 2020-01-13 NOTE — Telephone Encounter (Signed)
MRSE- R: oxacillin, cipro.  Stop keflex.  Called pt instructed him to take doxy for 1 month.  Caution about sunburning.  Will see him in 1 month.

## 2020-01-18 NOTE — Discharge Summary (Signed)
Patient ID: Geoffrey West MRN: [id number] DOB/AGE: 1955-11-12 64 y.o.  Admit date: 01/06/2020 Discharge date: 01/07/2020   Primary Diagnosis: Chronic infection left shoulder arthroplasty Admission Diagnoses:  Past Medical History:  Diagnosis Date  . Anxiety   . Arthritis    "all over" (03/06/2018)  . Close exposure to COVID-19 virus 07/08/2019  . GERD (gastroesophageal reflux disease)   . Gout    "on daily RX" (03/06/2018)  . History of kidney stones     passed x 1  . Hypertension   . Hypertension 12/25/2016  . Loosening of total shoulder replacement (HCC)    left  . Low testosterone 12/25/2016  . Migraine    "haven't had more than 1/year since propanolol started" (03/06/2018)  . Propionibacterium infection 08/28/2019  . 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 Rash 09/17/2019  . Situational depression    "recently lost my mom and brother" (03/06/2018)  . Wears contact lenses   . 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   Weight as of this encounter: 95.3 kg.  Procedure:  Procedure(s) (LRB): LEFT SHOULDER ARTHROPLASTY EXPLANT WITH ANTIBIOTIC SPACER PLACEMENT (Left)   Consults: None  HPI: Admitted for operative treatment of his chronic left shoulder infection.  Plan for explantation and hardware removal with antibiotic spacer placement. Laboratory Data: Admission on 01/06/2020, Discharged on 01/07/2020  Component Date Value Ref Range Status  . SURGICAL PATHOLOGY 01/06/2020    Final-Edited                   Value:SURGICAL PATHOLOGY CASE: [id number] PATIENT: [facility] Surgical Pathology Report     Clinical History: left shoulder chronic prosthesis infection (cm)   FINAL  MICROSCOPIC DIAGNOSIS:  A. SOFT TISSUE, LEFT SHOULDER, EXCISION: -  Fibrosis and granulation tissue with acute inflammation -  GROSS DESCRIPTION:  The specimen is received fresh and consists of a 3.5 x 2.5 x 1.0 cm aggregate of tan-gray softened tissue.  Representative sections are submitted in 1 cassette. Craig Staggers 01/08/2020)    Final Diagnosis performed by Thressa Sheller, MD.   Electronically signed 01/08/2020 Technical and / or Professional components performed at [facility], [place] [address] [place], [place] [address].  Immunohistochemistry Technical component (if applicable) was performed at [facility]. [address] [place], [place], [place] [address].   IMMUNOHISTOCHEMISTRY DISCLAIMER (if applicable): Some of these immunohistochemical stains may have bee                         n developed and the performance characteristics determine by [facility]. Some may not have been cleared or approved by the U.S. Food and Drug Administration. The FDA has determined that such clearance or approval is not necessary. This test is used for clinical purposes. It should not be regarded as investigational or for research. This laboratory is certified under the [place] (CLIA-88) as qualified to perform high complexity clinical laboratory testing.  The controls stained appropriately.   [name] Specimen Description 01/06/2020 TISSUE LEFT SHOULDER   Final  . Special Requests 01/06/2020 NONE   Final  . Gram Stain 01/06/2020  Final                   Value:MODERATE WBC PRESENT, PREDOMINANTLY PMN RARE GRAM POSITIVE COCCI   . Culture 01/06/2020    Final                   Value:RARE STAPHYLOCOCCUS EPIDERMIDIS NO ANAEROBES ISOLATED Performed at [facility], [place] [address] [place], [place] [address]   . Report Status 01/06/2020 01/11/2020 FINAL   Final  . Organism ID, Bacteria 01/06/2020 STAPHYLOCOCCUS  EPIDERMIDIS   Final  . WBC 01/07/2020 9.3  4.0 - 10.5 K/uL Final  . RBC 01/07/2020 4.54  4.22 - 5.81 MIL/uL Final  . Hemoglobin 01/07/2020 13.3  13.0 - 17.0 g/dL Final  . HCT 01/07/2020 41.1  39 - 52 % Final  . MCV 01/07/2020 90.5  80.0 - 100.0 fL Final  . MCH 01/07/2020 29.3  26.0 - 34.0 pg Final  . MCHC 01/07/2020 32.4  30.0 - 36.0 g/dL Final  . RDW 01/07/2020 15.6* 11.5 - 15.5 % Final  . Platelets 01/07/2020 249  150 - 400 K/uL Final  . nRBC 01/07/2020 0.0  0.0 - 0.2 % Final   Performed at [facility] [address], [place], [place] [address]  . Sodium 01/07/2020 135  135 - 145 mmol/L Final  . Potassium 01/07/2020 4.0  3.5 - 5.1 mmol/L Final  . Chloride 01/07/2020 100  98 - 111 mmol/L Final  . CO2 01/07/2020 25  22 - 32 mmol/L Final  . Glucose, Bld 01/07/2020 166* 70 - 99 mg/dL Final   Glucose reference range applies only to samples taken after fasting for at least 8 hours.  . BUN 01/07/2020 20  8 - 23 mg/dL Final  . Creatinine, Ser 01/07/2020 1.52* 0.61 - 1.24 mg/dL Final  . Calcium 01/07/2020 8.7* 8.9 - 10.3 mg/dL Final  . GFR calc non Af Amer 01/07/2020 48* >60 mL/min Final  . GFR calc Af Amer 01/07/2020 56* >60 mL/min Final  . Anion gap 01/07/2020 10  5 - 15 Final   Performed at [facility], [place] [address], [place], [place] [address]  Hospital Outpatient Visit on 01/03/2020  Component Date Value Ref Range Status  . SARS Coronavirus 2 01/03/2020 NEGATIVE  NEGATIVE Final   Comment: (NOTE) SARS-CoV-2 target nucleic acids are NOT DETECTED.  The SARS-CoV-2 RNA is generally detectable in upper and lower respiratory specimens during the acute phase of infection. Negative results do not preclude SARS-CoV-2 infection, do not rule out co-infections with other pathogens, and should not be used as the sole basis for treatment or other patient management decisions. Negative results must be combined with clinical observations, patient history, and epidemiological  information. The expected result is Negative.  Fact Sheet for Patients: SugarRoll.be  Fact Sheet for Healthcare Providers: https://www.woods-mathews.com/  This test is not yet approved or cleared by the [place] FDA and  has been authorized for detection and/or diagnosis of SARS-CoV-2 by FDA under an Emergency Use Authorization (EUA). This EUA will remain  in effect (meaning this test can be used) for the duration of the COVID-19 declaration under Se                          ction 564(b)(1) of the Act, 21 U.S.C. section 360bbb-3(b)(1), unless the authorization is terminated or revoked sooner.  Performed at [facility], [place] [address], [place], [place]  [place] Visit on 01/01/2020  Component Date Value Ref Range Status  . MRSA, PCR 01/01/2020 NEGATIVE  NEGATIVE Final  . Staphylococcus aureus 01/01/2020 NEGATIVE  NEGATIVE Final   Comment: (NOTE) The Xpert SA Assay (FDA approved for NASAL specimens in patients 78 years of age and older), is one component of a comprehensive surveillance program. It is not intended to diagnose infection nor to guide or monitor treatment. Performed at [facility], [place] [address] [place], [place] [address]   . Sodium 01/01/2020 139  135 - 145 mmol/L Final  . Potassium 01/01/2020 4.0  3.5 - 5.1 mmol/L Final  . Chloride 01/01/2020 100  98 - 111 mmol/L Final  . CO2 01/01/2020 32  22 - 32 mmol/L Final  . Glucose, Bld 01/01/2020 101* 70 - 99 mg/dL Final   Glucose reference range applies only to samples taken after fasting for at least 8 hours.  . BUN 01/01/2020 20  8 - 23 mg/dL Final  . Creatinine, Ser 01/01/2020 1.34* 0.61 - 1.24 mg/dL Final  . Calcium 01/01/2020 9.8  8.9 - 10.3 mg/dL Final  . GFR calc non Af Amer 01/01/2020 56* >60 mL/min Final  . GFR calc Af Amer 01/01/2020 >60  >60 mL/min Final  . Anion gap 01/01/2020 7  5 - 15 Final   Performed at [facility], [place] [address] [place], [place] [address]  . WBC 01/01/2020 9.7  4.0 - 10.5 K/uL Final  . RBC 01/01/2020 5.39  4.22 - 5.81 MIL/uL Final  . Hemoglobin 01/01/2020 16.0  13.0 - 17.0 g/dL Final  . HCT 01/01/2020 49.7  39 - 52 % Final  . MCV 01/01/2020 92.2  80.0 - 100.0 fL Final  . MCH 01/01/2020 29.7  26.0 - 34.0 pg Final  . MCHC 01/01/2020 32.2  30.0 - 36.0 g/dL Final  . RDW 01/01/2020 15.9* 11.5 - 15.5 % Final  . Platelets 01/01/2020 277  150 - 400 K/uL Final  . nRBC 01/01/2020 0.0  0.0 - 0.2 % Final   Performed at [facility], [place] [address], [place], [place] [address]  Office Visit on 12/09/2019  Component Date Value Ref Range Status  . WBC 12/09/2019 5.8  3.8 - 10.8 Thousand/uL Final  . RBC 12/09/2019 5.51  4.20 - 5.80 Million/uL Final  . Hemoglobin 12/09/2019 16.7  13.2 - 17.1 g/dL Final  . HCT 12/09/2019 48.4  38 - 50 % Final  . MCV 12/09/2019 87.8  80.0 - 100.0 fL Final  . MCH 12/09/2019 30.3  27.0 - 33.0 pg Final  . MCHC 12/09/2019 34.5  32.0 - 36.0 g/dL Final  . RDW 12/09/2019 14.1  11.0 - 15.0 % Final  . Platelets 12/09/2019 212  140 - 400 Thousand/uL Final  . MPV 12/09/2019 10.7  7.5 - 12.5 fL Final  . Sed Rate 12/09/2019 28* 0 - 20 mm/h Final  . CRP 12/09/2019 8.4* <8.0 mg/L Final     X-Rays:No results found.  EKG: Orders placed or performed during the hospital encounter of 01/01/20  . EKG 12 lead per protocol  . EKG 12 lead per protocol     Hospital Course: CAESAR MANNELLA is a 64 y.o. who was admitted to Hospital. They were brought to the operating room on 01/06/2020 and underwent Procedure(s): LEFT SHOULDER ARTHROPLASTY EXPLANT WITH ANTIBIOTIC SPACER PLACEMENT.  Patient tolerated the procedure well and was later transferred to the recovery room and then to the orthopaedic  floor for postoperative care.  They were given PO and IV analgesics for pain control following their surgery.  They were given 24 hours of postoperative antibiotics of   Anti-infectives (From admission, onward)   Start     Dose/Rate Route Frequency Ordered Stop   01/07/20 0600  ceFAZolin (ANCEF) IVPB 2g/100 mL premix        2 g 200 mL/hr over 30 Minutes Intravenous On call to O.R. 01/06/20 1223 01/06/20 1440   01/06/20 2000  ceFAZolin (ANCEF) IVPB 1 g/50 mL premix        1 g 100 mL/hr over 30 Minutes Intravenous Every 6 hours 01/06/20 1810 01/07/20 0903   01/06/20 1608  vancomycin (VANCOCIN) powder  Status:  Discontinued          As needed 01/06/20 1609 01/06/20 1714   01/06/20 1238  ceFAZolin (ANCEF) 2-4 GM/100ML-% IVPB       Note to Pharmacy: Nyoka Cowden   : cabinet override      01/06/20 1238 01/06/20 1429     and started on DVT prophylaxis in the form of Aspirin.   Incision was healing well.  Patient was seen in rounds and was ready to go home on POD 1.   Diet: Regular diet Activity:NWB Follow-up:in 2 weeks Disposition - Home Discharged Condition: good   Discharge Instructions    Call MD / Call 911   Complete by: As directed    If you experience chest pain or shortness of breath, CALL 911 and be transported to the hospital emergency room.  If you develope a fever above 101 F, pus (white drainage) or increased drainage or redness at the wound, or calf pain, call your surgeon's office.   Constipation Prevention   Complete by: As directed    Drink plenty of fluids.  Prune juice may be helpful.  You may use a stool softener, such as Colace (over the counter) 100 mg twice a day.  Use MiraLax (over the counter) for constipation as needed.   Diet - low sodium heart healthy   Complete by: As directed    Increase activity slowly as tolerated   Complete by: As directed      Allergies as of 01/07/2020      Reactions   Fentanyl Shortness Of Breath, Other (See Comments)   Reaction to [facility] ONLY > ? DOSE REGULATION ?   Oxycodone Other (See Comments)   Pt states he goes through hot and cold flashes withdrawal like symptoms      Medication List     TAKE these medications   allopurinol 300 MG tablet Commonly known as: ZYLOPRIM Take 300 mg by mouth daily.   amLODipine 10 MG tablet Commonly known as: NORVASC Take 10 mg by mouth at bedtime.   anastrozole 1 MG tablet Commonly known as: ARIMIDEX Take 0.5 mg by mouth every Thursday.   Armour Thyroid 30 MG tablet Generic drug: thyroid Take 30 mg by mouth daily before breakfast.   clonazePAM 1 MG tablet Commonly known as: KLONOPIN Take 0.5-1 mg by mouth daily as needed (anxiety/panic attacks).   CoQ10 200 MG Caps Take 200 mg by mouth daily.   diclofenac 75 MG EC tablet Commonly known as: VOLTAREN Take 75 mg by mouth in the morning and at bedtime.   fluticasone 50 MCG/ACT nasal spray Commonly known as: FLONASE Place 2 sprays into both nostrils daily as needed for allergies or rhinitis.   multivitamin with minerals Tabs tablet Take 1 tablet by mouth daily.  pantoprazole 40 MG tablet Commonly known as: PROTONIX Take 40 mg by mouth every morning.   propranolol ER 80 MG 24 hr capsule Commonly known as: INDERAL LA Take 80 mg by mouth every evening.   tacrolimus 0.1 % ointment Commonly known as: PROTOPIC Apply 1 application topically at bedtime.   testosterone cypionate 200 MG/ML injection Commonly known as: DEPOTESTOSTERONE CYPIONATE Inject 100 mg into the muscle every Thursday.        Signed: Geralynn Rile, MD Orthopaedic Surgery 01/18/2020, 10:18 AM

## 2020-01-30 DIAGNOSIS — L920 Granuloma annulare: Secondary | ICD-10-CM | POA: Diagnosis not present

## 2020-01-30 DIAGNOSIS — L905 Scar conditions and fibrosis of skin: Secondary | ICD-10-CM | POA: Diagnosis not present

## 2020-02-02 DIAGNOSIS — S0502XA Injury of conjunctiva and corneal abrasion without foreign body, left eye, initial encounter: Secondary | ICD-10-CM | POA: Diagnosis not present

## 2020-02-11 ENCOUNTER — Telehealth: Payer: Self-pay

## 2020-02-11 ENCOUNTER — Other Ambulatory Visit: Payer: Self-pay

## 2020-02-11 DIAGNOSIS — Z96612 Presence of left artificial shoulder joint: Secondary | ICD-10-CM

## 2020-02-11 MED ORDER — DOXYCYCLINE HYCLATE 100 MG PO TABS: 100.0000 mg | ORAL_TABLET | Freq: Two times a day (BID) | ORAL | 0 refills | Status: DC

## 2020-02-11 NOTE — Telephone Encounter (Signed)
Refill til seen, thanks!

## 2020-02-11 NOTE — Telephone Encounter (Signed)
Patient has about 4 days left of doxycycline; was wondering if he needed an additional refill placed as he won't be seen by Dr. Johnnye Sima until 8/31 leaving about a week without any treatment. Per note, patient prescribed 1 month of treatment. Forwarding to provider for refills or to remain off until visit.  Keirsten Matuska Lorita Officer, RN

## 2020-02-24 ENCOUNTER — Other Ambulatory Visit: Payer: Self-pay

## 2020-02-24 ENCOUNTER — Ambulatory Visit (INDEPENDENT_AMBULATORY_CARE_PROVIDER_SITE_OTHER): Payer: PPO | Admitting: Infectious Diseases

## 2020-02-24 ENCOUNTER — Encounter: Payer: Self-pay | Admitting: Infectious Diseases

## 2020-02-24 DIAGNOSIS — Z96612 Presence of left artificial shoulder joint: Secondary | ICD-10-CM | POA: Diagnosis not present

## 2020-02-24 DIAGNOSIS — Z20822 Contact with and (suspected) exposure to covid-19: Secondary | ICD-10-CM | POA: Diagnosis not present

## 2020-02-24 DIAGNOSIS — T8459XA Infection and inflammatory reaction due to other internal joint prosthesis, initial encounter: Secondary | ICD-10-CM

## 2020-02-24 NOTE — Assessment & Plan Note (Signed)
Encouraged him to get vaccinated.

## 2020-02-24 NOTE — Assessment & Plan Note (Signed)
Will continue him on doxy for now Will repeat his CRP and ESR today.  Will call him with results, discuss stopping his anbx.  Tolerating doxy well except for mild sunburn.

## 2020-02-24 NOTE — Progress Notes (Signed)
Subjective:    Patient ID: Geoffrey West, male    DOB: 01-30-1956, 64 y.o.   MRN: [id number]  HPI 64yo M withHTN, gout, low testosterone and failed shoulder arthroplasty. He was taken to OR by Dr. [name] on June 15th of 2018 and had shoulder revision and reversal of total shoulder arthroplasty.His Cx grewpropionobacterium and MSSE (pan-sens).   He was treated with ceftriaxone/rifampin then zyvox and rifampin. He was ultimately transitioned to augmentin to complete 6 months of therapy.  He returned to the [facility] underwent explantation of all components of his left reverse shoulder arthroplasty with insertion of antibiotic spacer in the left shoulder with open reduction internal internal fixation of a left proximal humerus fracture.There is a retained screw.  His Cx again grewPropionibacterium on all 4 specimens were sent. Patient was treated with ceftriaxone as an inpatient also had gotten vancomycin. He was ultimately discharged on oral Keflex 500 mg 4 times daily because he did not want have a PICC line again.  Unfortunately,he had to go back to OR andunderwent explantation of his humeral antibiotic spacer, bone grafting of the left glenoid fossa bone defect excisional debridement of bone muscle subcutaneous tissue and placement of a left antibiotic spacer.Culturesdone in September on antibiotics failed to yield an organism. He ultimately was taken back to the operating room in [place] on oral Keflex and underwent plan of his humeral antibiotic spacer and reversal of his total shoulder revision.  He returnedto the operating room January 21, 2020and underwent excisional and non-excisional debridement of infected left shoulder prosthesis including skin subcutaneous tissue muscle and fascia. A reverse total shoulder revision was performed with polyethylene and glenosphere head ball of 2 components.His Cx were (-). He was treated with IV ceftriaxone then  transitioned to po keflex.  He had furthershoulder surgeries on January 04, 2019 he underwent a left reverse shoulder revision of one component and explantation of the glenosphere and placement of a CTA head, along with excisional debridement of left shoulder including pseudocapsule bone muscle and skin. Cx were (-).  He then went back to the operating room on July 2 and underwent revision of his left shoulder hemiarthroplasty with revision of the humeral head. There were seromas encountered intraoperatively but Dr. [name] did not feel that any of this looked purulent.  At his last visit, he was resumed on keflex due to shoulder pain and increased inflammatory markers (CRP 22). He believes he has been back on rx for 2- 2.5 weeks. Has given him some GI upset.  He had CT scan 07-2019 Marked sclerosis and concavity of the glenoid consistent with history of prior infection and degenerative change. As noted above, the superior and inferior rim of the glenoid are thinned. Retained screw fragment in the superior glenoid noted  He had arthroscopy 09-2019 which showed 19,236 WBC, Cx was (-).  He was seen 11-07-19: increasing erythema of shoulder, warmth. He had limitation of his ADLs. He had CRP 29.2. he was supposed to have CT scan to f/u. He had ortho 2nd opinion @ [facility] 11-11-19 they decided to defer repeat CT.He had repeat arthrocentesis that was no growth after 4 days (not held 14 days due to "contamination"). They discussed resection vs continued suppressive anbx. He previouslyhad notable erythema on his shoulder.  He was continued on keflexuntil 11-18-19 when he was changed to zyvox. His shoulders (both) improved while on this until he then developed pain in his R shoulder.  He was also scheduled for a repeat CT  scan but told by pther surgeon he did not need it.   He was seen by derm for persistent erythema of his shoulder- bx  Light growth of P agglomerans.  He was seen by allergist, did  not think he has prosthetic allergy causing this.   Had shoulder arthroplasty 7-13. Had spacer placed, hardware removed. Fluid noted at time of surgery.  Path: A. SOFT TISSUE, LEFT SHOULDER, EXCISION:  - Fibrosis and granulation tissue with acute inflammation  Cx- Staph epi (sensi pending). Was given keflex for 1 week post op due to fluid, erythema (minimal). Then was transitioned to 1 month of doxy, to continue til he had f/u today.  He has had good movement of his shoulder, sling removed last week.  Swelling is better, erythema also inproving (getting steroid injections from derm).  Has gotten some sun.   Review of Systems  Constitutional: Negative for appetite change, chills, fever and unexpected weight change.  Gastrointestinal: Negative for constipation and diarrhea.  Genitourinary: Negative for difficulty urinating.  Skin: Positive for rash.       Objective:   Physical Exam        Assessment & Plan:

## 2020-02-25 ENCOUNTER — Telehealth: Payer: Self-pay

## 2020-02-25 LAB — SEDIMENTATION RATE: Sed Rate: 2 mm/h (ref 0–20)

## 2020-02-25 LAB — C-REACTIVE PROTEIN: CRP: 2.1 mg/L (ref <8.0)

## 2020-02-25 NOTE — Telephone Encounter (Signed)
That would be great thanks 

## 2020-02-25 NOTE — Telephone Encounter (Signed)
Patient called office today after viewing results on mychart. Would like MD to review them, and advise if he can d/c antibiotics. Will forward message to MD. Aundria Rud, [place]

## 2020-03-02 DIAGNOSIS — M9903 Segmental and somatic dysfunction of lumbar region: Secondary | ICD-10-CM | POA: Diagnosis not present

## 2020-03-02 DIAGNOSIS — M5136 Other intervertebral disc degeneration, lumbar region: Secondary | ICD-10-CM | POA: Diagnosis not present

## 2020-03-02 DIAGNOSIS — M9905 Segmental and somatic dysfunction of pelvic region: Secondary | ICD-10-CM | POA: Diagnosis not present

## 2020-03-02 DIAGNOSIS — M9904 Segmental and somatic dysfunction of sacral region: Secondary | ICD-10-CM | POA: Diagnosis not present

## 2020-03-02 DIAGNOSIS — M545 Low back pain: Secondary | ICD-10-CM | POA: Diagnosis not present

## 2020-03-02 DIAGNOSIS — M47817 Spondylosis without myelopathy or radiculopathy, lumbosacral region: Secondary | ICD-10-CM | POA: Diagnosis not present

## 2020-03-02 DIAGNOSIS — M7918 Myalgia, other site: Secondary | ICD-10-CM | POA: Diagnosis not present

## 2020-03-02 DIAGNOSIS — M25652 Stiffness of left hip, not elsewhere classified: Secondary | ICD-10-CM | POA: Diagnosis not present

## 2020-03-02 DIAGNOSIS — M5127 Other intervertebral disc displacement, lumbosacral region: Secondary | ICD-10-CM | POA: Diagnosis not present

## 2020-03-02 DIAGNOSIS — M25651 Stiffness of right hip, not elsewhere classified: Secondary | ICD-10-CM | POA: Diagnosis not present

## 2020-03-08 DIAGNOSIS — M5127 Other intervertebral disc displacement, lumbosacral region: Secondary | ICD-10-CM | POA: Diagnosis not present

## 2020-03-08 DIAGNOSIS — M47817 Spondylosis without myelopathy or radiculopathy, lumbosacral region: Secondary | ICD-10-CM | POA: Diagnosis not present

## 2020-03-08 DIAGNOSIS — M5136 Other intervertebral disc degeneration, lumbar region: Secondary | ICD-10-CM | POA: Diagnosis not present

## 2020-03-08 DIAGNOSIS — M9903 Segmental and somatic dysfunction of lumbar region: Secondary | ICD-10-CM | POA: Diagnosis not present

## 2020-03-08 DIAGNOSIS — M545 Low back pain: Secondary | ICD-10-CM | POA: Diagnosis not present

## 2020-03-08 DIAGNOSIS — M25651 Stiffness of right hip, not elsewhere classified: Secondary | ICD-10-CM | POA: Diagnosis not present

## 2020-03-08 DIAGNOSIS — M7918 Myalgia, other site: Secondary | ICD-10-CM | POA: Diagnosis not present

## 2020-03-08 DIAGNOSIS — M9904 Segmental and somatic dysfunction of sacral region: Secondary | ICD-10-CM | POA: Diagnosis not present

## 2020-03-08 DIAGNOSIS — M25652 Stiffness of left hip, not elsewhere classified: Secondary | ICD-10-CM | POA: Diagnosis not present

## 2020-03-08 DIAGNOSIS — M9905 Segmental and somatic dysfunction of pelvic region: Secondary | ICD-10-CM | POA: Diagnosis not present

## 2020-03-11 DIAGNOSIS — G479 Sleep disorder, unspecified: Secondary | ICD-10-CM | POA: Diagnosis not present

## 2020-03-11 DIAGNOSIS — E291 Testicular hypofunction: Secondary | ICD-10-CM | POA: Diagnosis not present

## 2020-03-11 DIAGNOSIS — E039 Hypothyroidism, unspecified: Secondary | ICD-10-CM | POA: Diagnosis not present

## 2020-03-12 DIAGNOSIS — L920 Granuloma annulare: Secondary | ICD-10-CM | POA: Diagnosis not present

## 2020-03-15 DIAGNOSIS — M9905 Segmental and somatic dysfunction of pelvic region: Secondary | ICD-10-CM | POA: Diagnosis not present

## 2020-03-15 DIAGNOSIS — M7918 Myalgia, other site: Secondary | ICD-10-CM | POA: Diagnosis not present

## 2020-03-15 DIAGNOSIS — M545 Low back pain: Secondary | ICD-10-CM | POA: Diagnosis not present

## 2020-03-15 DIAGNOSIS — M25652 Stiffness of left hip, not elsewhere classified: Secondary | ICD-10-CM | POA: Diagnosis not present

## 2020-03-15 DIAGNOSIS — M25651 Stiffness of right hip, not elsewhere classified: Secondary | ICD-10-CM | POA: Diagnosis not present

## 2020-03-15 DIAGNOSIS — M9904 Segmental and somatic dysfunction of sacral region: Secondary | ICD-10-CM | POA: Diagnosis not present

## 2020-03-15 DIAGNOSIS — M5136 Other intervertebral disc degeneration, lumbar region: Secondary | ICD-10-CM | POA: Diagnosis not present

## 2020-03-15 DIAGNOSIS — M47817 Spondylosis without myelopathy or radiculopathy, lumbosacral region: Secondary | ICD-10-CM | POA: Diagnosis not present

## 2020-03-15 DIAGNOSIS — M5127 Other intervertebral disc displacement, lumbosacral region: Secondary | ICD-10-CM | POA: Diagnosis not present

## 2020-03-15 DIAGNOSIS — M9903 Segmental and somatic dysfunction of lumbar region: Secondary | ICD-10-CM | POA: Diagnosis not present

## 2020-03-18 DIAGNOSIS — E291 Testicular hypofunction: Secondary | ICD-10-CM | POA: Diagnosis not present

## 2020-03-18 DIAGNOSIS — I10 Essential (primary) hypertension: Secondary | ICD-10-CM | POA: Diagnosis not present

## 2020-03-18 DIAGNOSIS — M1990 Unspecified osteoarthritis, unspecified site: Secondary | ICD-10-CM | POA: Diagnosis not present

## 2020-03-18 DIAGNOSIS — Z6830 Body mass index (BMI) 30.0-30.9, adult: Secondary | ICD-10-CM | POA: Diagnosis not present

## 2020-03-18 DIAGNOSIS — G43709 Chronic migraine without aura, not intractable, without status migrainosus: Secondary | ICD-10-CM | POA: Diagnosis not present

## 2020-03-18 DIAGNOSIS — M109 Gout, unspecified: Secondary | ICD-10-CM | POA: Diagnosis not present

## 2020-03-18 DIAGNOSIS — Z87898 Personal history of other specified conditions: Secondary | ICD-10-CM | POA: Diagnosis not present

## 2020-03-18 DIAGNOSIS — E039 Hypothyroidism, unspecified: Secondary | ICD-10-CM | POA: Diagnosis not present

## 2020-03-19 DIAGNOSIS — G7249 Other inflammatory and immune myopathies, not elsewhere classified: Secondary | ICD-10-CM | POA: Diagnosis not present

## 2020-03-19 DIAGNOSIS — Z4789 Encounter for other orthopedic aftercare: Secondary | ICD-10-CM | POA: Diagnosis not present

## 2020-03-22 DIAGNOSIS — M47817 Spondylosis without myelopathy or radiculopathy, lumbosacral region: Secondary | ICD-10-CM | POA: Diagnosis not present

## 2020-03-22 DIAGNOSIS — M25652 Stiffness of left hip, not elsewhere classified: Secondary | ICD-10-CM | POA: Diagnosis not present

## 2020-03-22 DIAGNOSIS — M9903 Segmental and somatic dysfunction of lumbar region: Secondary | ICD-10-CM | POA: Diagnosis not present

## 2020-03-22 DIAGNOSIS — M7918 Myalgia, other site: Secondary | ICD-10-CM | POA: Diagnosis not present

## 2020-03-22 DIAGNOSIS — M9905 Segmental and somatic dysfunction of pelvic region: Secondary | ICD-10-CM | POA: Diagnosis not present

## 2020-03-22 DIAGNOSIS — M5136 Other intervertebral disc degeneration, lumbar region: Secondary | ICD-10-CM | POA: Diagnosis not present

## 2020-03-22 DIAGNOSIS — M25651 Stiffness of right hip, not elsewhere classified: Secondary | ICD-10-CM | POA: Diagnosis not present

## 2020-03-22 DIAGNOSIS — M5127 Other intervertebral disc displacement, lumbosacral region: Secondary | ICD-10-CM | POA: Diagnosis not present

## 2020-03-22 DIAGNOSIS — M9904 Segmental and somatic dysfunction of sacral region: Secondary | ICD-10-CM | POA: Diagnosis not present

## 2020-03-22 DIAGNOSIS — M545 Low back pain: Secondary | ICD-10-CM | POA: Diagnosis not present

## 2020-04-05 DIAGNOSIS — M25652 Stiffness of left hip, not elsewhere classified: Secondary | ICD-10-CM | POA: Diagnosis not present

## 2020-04-05 DIAGNOSIS — M47817 Spondylosis without myelopathy or radiculopathy, lumbosacral region: Secondary | ICD-10-CM | POA: Diagnosis not present

## 2020-04-05 DIAGNOSIS — M5136 Other intervertebral disc degeneration, lumbar region: Secondary | ICD-10-CM | POA: Diagnosis not present

## 2020-04-05 DIAGNOSIS — M9903 Segmental and somatic dysfunction of lumbar region: Secondary | ICD-10-CM | POA: Diagnosis not present

## 2020-04-05 DIAGNOSIS — M25651 Stiffness of right hip, not elsewhere classified: Secondary | ICD-10-CM | POA: Diagnosis not present

## 2020-04-05 DIAGNOSIS — M9904 Segmental and somatic dysfunction of sacral region: Secondary | ICD-10-CM | POA: Diagnosis not present

## 2020-04-05 DIAGNOSIS — M5127 Other intervertebral disc displacement, lumbosacral region: Secondary | ICD-10-CM | POA: Diagnosis not present

## 2020-04-05 DIAGNOSIS — M545 Low back pain, unspecified: Secondary | ICD-10-CM | POA: Diagnosis not present

## 2020-04-05 DIAGNOSIS — M9905 Segmental and somatic dysfunction of pelvic region: Secondary | ICD-10-CM | POA: Diagnosis not present

## 2020-04-05 DIAGNOSIS — M7918 Myalgia, other site: Secondary | ICD-10-CM | POA: Diagnosis not present

## 2020-04-09 DIAGNOSIS — G7249 Other inflammatory and immune myopathies, not elsewhere classified: Secondary | ICD-10-CM | POA: Diagnosis not present

## 2020-04-15 DIAGNOSIS — Z96612 Presence of left artificial shoulder joint: Secondary | ICD-10-CM | POA: Diagnosis not present

## 2020-04-21 DIAGNOSIS — M5136 Other intervertebral disc degeneration, lumbar region: Secondary | ICD-10-CM | POA: Diagnosis not present

## 2020-04-21 DIAGNOSIS — M5127 Other intervertebral disc displacement, lumbosacral region: Secondary | ICD-10-CM | POA: Diagnosis not present

## 2020-04-21 DIAGNOSIS — M47817 Spondylosis without myelopathy or radiculopathy, lumbosacral region: Secondary | ICD-10-CM | POA: Diagnosis not present

## 2020-04-21 DIAGNOSIS — M25652 Stiffness of left hip, not elsewhere classified: Secondary | ICD-10-CM | POA: Diagnosis not present

## 2020-04-21 DIAGNOSIS — M545 Low back pain, unspecified: Secondary | ICD-10-CM | POA: Diagnosis not present

## 2020-04-21 DIAGNOSIS — M9903 Segmental and somatic dysfunction of lumbar region: Secondary | ICD-10-CM | POA: Diagnosis not present

## 2020-04-21 DIAGNOSIS — M9905 Segmental and somatic dysfunction of pelvic region: Secondary | ICD-10-CM | POA: Diagnosis not present

## 2020-04-21 DIAGNOSIS — M25651 Stiffness of right hip, not elsewhere classified: Secondary | ICD-10-CM | POA: Diagnosis not present

## 2020-04-21 DIAGNOSIS — M7918 Myalgia, other site: Secondary | ICD-10-CM | POA: Diagnosis not present

## 2020-04-21 DIAGNOSIS — M9904 Segmental and somatic dysfunction of sacral region: Secondary | ICD-10-CM | POA: Diagnosis not present

## 2020-04-23 DIAGNOSIS — L920 Granuloma annulare: Secondary | ICD-10-CM | POA: Diagnosis not present

## 2020-05-03 DIAGNOSIS — M5127 Other intervertebral disc displacement, lumbosacral region: Secondary | ICD-10-CM | POA: Diagnosis not present

## 2020-05-03 DIAGNOSIS — M545 Low back pain, unspecified: Secondary | ICD-10-CM | POA: Diagnosis not present

## 2020-05-03 DIAGNOSIS — M9904 Segmental and somatic dysfunction of sacral region: Secondary | ICD-10-CM | POA: Diagnosis not present

## 2020-05-03 DIAGNOSIS — M9903 Segmental and somatic dysfunction of lumbar region: Secondary | ICD-10-CM | POA: Diagnosis not present

## 2020-05-03 DIAGNOSIS — M25652 Stiffness of left hip, not elsewhere classified: Secondary | ICD-10-CM | POA: Diagnosis not present

## 2020-05-03 DIAGNOSIS — M5136 Other intervertebral disc degeneration, lumbar region: Secondary | ICD-10-CM | POA: Diagnosis not present

## 2020-05-03 DIAGNOSIS — M47817 Spondylosis without myelopathy or radiculopathy, lumbosacral region: Secondary | ICD-10-CM | POA: Diagnosis not present

## 2020-05-03 DIAGNOSIS — M7918 Myalgia, other site: Secondary | ICD-10-CM | POA: Diagnosis not present

## 2020-05-03 DIAGNOSIS — M25651 Stiffness of right hip, not elsewhere classified: Secondary | ICD-10-CM | POA: Diagnosis not present

## 2020-05-03 DIAGNOSIS — M9905 Segmental and somatic dysfunction of pelvic region: Secondary | ICD-10-CM | POA: Diagnosis not present

## 2020-05-17 DIAGNOSIS — M25652 Stiffness of left hip, not elsewhere classified: Secondary | ICD-10-CM | POA: Diagnosis not present

## 2020-05-17 DIAGNOSIS — M5127 Other intervertebral disc displacement, lumbosacral region: Secondary | ICD-10-CM | POA: Diagnosis not present

## 2020-05-17 DIAGNOSIS — M9905 Segmental and somatic dysfunction of pelvic region: Secondary | ICD-10-CM | POA: Diagnosis not present

## 2020-05-17 DIAGNOSIS — M5136 Other intervertebral disc degeneration, lumbar region: Secondary | ICD-10-CM | POA: Diagnosis not present

## 2020-05-17 DIAGNOSIS — M9903 Segmental and somatic dysfunction of lumbar region: Secondary | ICD-10-CM | POA: Diagnosis not present

## 2020-05-17 DIAGNOSIS — M47817 Spondylosis without myelopathy or radiculopathy, lumbosacral region: Secondary | ICD-10-CM | POA: Diagnosis not present

## 2020-05-17 DIAGNOSIS — M25651 Stiffness of right hip, not elsewhere classified: Secondary | ICD-10-CM | POA: Diagnosis not present

## 2020-05-17 DIAGNOSIS — M9904 Segmental and somatic dysfunction of sacral region: Secondary | ICD-10-CM | POA: Diagnosis not present

## 2020-05-17 DIAGNOSIS — M7918 Myalgia, other site: Secondary | ICD-10-CM | POA: Diagnosis not present

## 2020-05-17 DIAGNOSIS — M545 Low back pain, unspecified: Secondary | ICD-10-CM | POA: Diagnosis not present

## 2020-05-18 DIAGNOSIS — K219 Gastro-esophageal reflux disease without esophagitis: Secondary | ICD-10-CM | POA: Diagnosis not present

## 2020-05-18 NOTE — Progress Notes (Signed)
[id number] - [place], [place] [address] [place] [address] Phone: [phone] Fax: [phone]      Your procedure is scheduled on Thursday 05/27/2020.  Report to [facility] Main Entrance "A" at 0800 A.M., and check in at the Admitting office.  Call this number if you have problems the morning of surgery:  [phone]  Call [phone] if you have any questions prior to your surgery date Monday-Friday 8am-4pm    Remember:  Do not eat after midnight the night before your surgery  You may drink clear liquids until 0700 the morning of your surgery.   Clear liquids allowed are: Water, Non-Citrus Juices (without pulp), Carbonated Beverages, Clear Tea, Black Coffee Only, and Gatorade   Enhanced Recovery after Surgery for Orthopedics Enhanced Recovery after Surgery is a protocol used to improve the stress on your body and your recovery after surgery.  Patient Instructions  . The night before surgery:  o No food after midnight. ONLY clear liquids after midnight  .  [name] The day of surgery (if you do NOT have diabetes):  o Drink ONE (1) Pre-Surgery Clear Ensure by 07:00 am the morning of surgery   o This drink was given to you during your hospital  pre-op appointment visit. o Nothing else to drink after completing the  Pre-Surgery Clear Ensure.         If you have questions, please contact your surgeon's office.     Take these medicines the morning of surgery with A SIP OF WATER: Allopurinol (Zyloprim) Armour Thyroid Pantoprazole (Protonix)  If NEEDED you may take the following medications the morning of surgery: Clonazepam (Klonopin) Fluticasone (Flonase) nasal spray   As of today, STOP taking any Celecoxib (Celebrex), Aspirin (unless otherwise instructed by your surgeon) Aleve, Naproxen, Ibuprofen, Motrin, Advil, Goody's, BC's, all herbal medications, fish oil, and all vitamins.                      Do not wear jewelry            Do not  wear lotions, powders, colognes, or deodorant.            Do not shave 48 hours prior to surgery.  Men may shave face and neck.            Do not bring valuables to the hospital.            [facility] is not responsible for any belongings or valuables.  Do NOT Smoke (Tobacco/Vaping) or drink Alcohol 24 hours prior to your procedure  If you use a CPAP at night, you may bring all equipment for your overnight stay.   Contacts, glasses, dentures or bridgework may not be worn into surgery.      For patients admitted to the hospital, discharge time will be determined by your treatment team.   Patients discharged the day of surgery will not be allowed to drive home, and someone needs to stay with them for 24 hours.    Special instructions:   [place]- Preparing For Surgery  Before surgery, you can play an important role. Because skin is not sterile, your skin needs to be as free of germs as possible. You can reduce the number of germs on your skin by washing with CHG (chlorahexidine gluconate) Soap before surgery.  CHG is an antiseptic cleaner which kills germs and bonds with the skin to continue killing germs even after washing.  Oral Hygiene is also important to reduce your risk of infection.  Remember - BRUSH YOUR TEETH THE MORNING OF SURGERY WITH YOUR REGULAR TOOTHPASTE  Please do not use if you have an allergy to CHG or antibacterial soaps. If your skin becomes reddened/irritated stop using the CHG.  Do not shave (including legs and underarms) for at least 48 hours prior to first CHG shower. It is OK to shave your face.  Please follow these instructions carefully.   1. Shower the NIGHT BEFORE SURGERY and the MORNING OF SURGERY with CHG Soap.   2. If you chose to wash your hair, wash your hair first as usual with your normal shampoo.  3. After you shampoo, rinse your hair and body thoroughly to remove the shampoo.  4. Use CHG as you would any other liquid soap. You can apply CHG  directly to the skin and wash gently with a scrungie or a clean washcloth.   5. Apply the CHG Soap to your body ONLY FROM THE NECK DOWN.  Do not use on open wounds or open sores. Avoid contact with your eyes, ears, mouth and genitals (private parts). Wash Face and genitals (private parts)  with your normal soap.   6. Wash thoroughly, paying special attention to the area where your surgery will be performed.  7. Thoroughly rinse your body with warm water from the neck down.  8. DO NOT shower/wash with your normal soap after using and rinsing off the CHG Soap.  9. Pat yourself dry with a CLEAN TOWEL.  10. Wear CLEAN PAJAMAS to bed the night before surgery  11. Place CLEAN SHEETS on your bed the night of your first shower and DO NOT SLEEP WITH PETS.   Day of Surgery: Shower with CHG soap as directed Wear Clean/Comfortable clothing the morning of surgery Do not apply any deodorants/lotions.   Remember to brush your teeth WITH YOUR REGULAR TOOTHPASTE.   Please read over the following fact sheets that you were given.

## 2020-05-19 ENCOUNTER — Other Ambulatory Visit: Payer: Self-pay

## 2020-05-19 ENCOUNTER — Encounter (HOSPITAL_COMMUNITY): Payer: Self-pay

## 2020-05-19 ENCOUNTER — Encounter (HOSPITAL_COMMUNITY)
Admission: RE | Admit: 2020-05-19 | Discharge: 2020-05-19 | Disposition: A | Discharge status: Home / Self Care | Payer: PPO | Source: Ambulatory Visit | Attending: Orthopedic Surgery | Admitting: Orthopedic Surgery

## 2020-05-19 DIAGNOSIS — Z01812 Encounter for preprocedural laboratory examination: Secondary | ICD-10-CM | POA: Diagnosis not present

## 2020-05-19 LAB — BASIC METABOLIC PANEL
Anion gap: 11 (ref 5–15)
BUN: 17 mg/dL (ref 8–23)
CO2: 27 mmol/L (ref 22–32)
Calcium: 9.9 mg/dL (ref 8.9–10.3)
Chloride: 102 mmol/L (ref 98–111)
Creatinine, Ser: 1.28 mg/dL — ABNORMAL HIGH (ref 0.61–1.24)
GFR, Estimated: >60 mL/min (ref >60)
Glucose, Bld: 103 mg/dL — ABNORMAL HIGH (ref 70–99)
Potassium: 3.7 mmol/L (ref 3.5–5.1)
Sodium: 140 mmol/L (ref 135–145)

## 2020-05-19 LAB — CBC
HCT: 53.1 % — ABNORMAL HIGH (ref 39.0–52.0)
Hemoglobin: 17.7 g/dL — ABNORMAL HIGH (ref 13.0–17.0)
MCH: 31.9 pg (ref 26.0–34.0)
MCHC: 33.3 g/dL (ref 30.0–36.0)
MCV: 95.7 fL (ref 80.0–100.0)
Platelets: 168 10*3/uL (ref 150–400)
RBC: 5.55 MIL/uL (ref 4.22–5.81)
RDW: 13.7 % (ref 11.5–15.5)
WBC: 5 10*3/uL (ref 4.0–10.5)
nRBC: 0 % (ref 0.0–0.2)

## 2020-05-19 NOTE — Progress Notes (Signed)
PCP -  Dr. Orpah Melter Cardiologist - patient denies  PPM/ICD - n/a Device Orders -  Rep Notified -   Chest x-ray - n/a EKG - 01/01/2020 Stress Test - 2011 ECHO - 2011 Cardiac Cath - patient denies  Sleep Study - patient denies CPAP -   Fasting Blood Sugar - n/a Checks Blood Sugar _____ times a day  Blood Thinner Instructions: n/a Aspirin Instructions: n/a  ERAS Protcol - clears until 0700 DOS PRE-SURGERY Ensure or G2- Ensure provided  COVID TEST- scheduled for Tuesday 05/25/2020   Anesthesia review: no  Patient denies shortness of breath, fever, cough and chest pain at PAT appointment   All instructions explained to the patient, with a verbal understanding of the material. Patient agrees to go over the instructions while at home for a better understanding. Patient also instructed to self quarantine after being tested for COVID-19. The opportunity to ask questions was provided.

## 2020-05-25 ENCOUNTER — Other Ambulatory Visit (HOSPITAL_COMMUNITY)
Admission: RE | Admit: 2020-05-25 | Discharge: 2020-05-25 | Disposition: A | Discharge status: Home / Self Care | Payer: PPO | Source: Ambulatory Visit | Attending: Orthopedic Surgery | Admitting: Orthopedic Surgery

## 2020-05-25 DIAGNOSIS — Z01812 Encounter for preprocedural laboratory examination: Secondary | ICD-10-CM | POA: Diagnosis not present

## 2020-05-25 DIAGNOSIS — Z20822 Contact with and (suspected) exposure to covid-19: Secondary | ICD-10-CM | POA: Insufficient documentation

## 2020-05-25 LAB — SARS CORONAVIRUS 2 (TAT 6-24 HRS): SARS Coronavirus 2: NEGATIVE

## 2020-05-27 ENCOUNTER — Other Ambulatory Visit: Payer: Self-pay

## 2020-05-27 ENCOUNTER — Encounter (HOSPITAL_COMMUNITY): Payer: Self-pay | Admitting: Orthopedic Surgery

## 2020-05-27 ENCOUNTER — Observation Stay (HOSPITAL_COMMUNITY)
Admission: RE | Admit: 2020-05-27 | Discharge: 2020-05-28 | Disposition: A | Discharge status: Home / Self Care | Payer: PPO | Attending: Orthopedic Surgery | Admitting: Orthopedic Surgery

## 2020-05-27 ENCOUNTER — Ambulatory Visit (HOSPITAL_COMMUNITY): Payer: PPO | Admitting: Anesthesiology

## 2020-05-27 ENCOUNTER — Encounter (HOSPITAL_COMMUNITY)
Admission: RE | Disposition: A | Discharge status: Home / Self Care | Payer: Self-pay | Source: Home / Self Care | Attending: Orthopedic Surgery

## 2020-05-27 DIAGNOSIS — Z96612 Presence of left artificial shoulder joint: Secondary | ICD-10-CM | POA: Diagnosis not present

## 2020-05-27 DIAGNOSIS — Z8619 Personal history of other infectious and parasitic diseases: Secondary | ICD-10-CM | POA: Diagnosis present

## 2020-05-27 DIAGNOSIS — Z96611 Presence of right artificial shoulder joint: Secondary | ICD-10-CM | POA: Insufficient documentation

## 2020-05-27 DIAGNOSIS — Z79899 Other long term (current) drug therapy: Secondary | ICD-10-CM | POA: Diagnosis not present

## 2020-05-27 DIAGNOSIS — I10 Essential (primary) hypertension: Secondary | ICD-10-CM | POA: Diagnosis not present

## 2020-05-27 DIAGNOSIS — T8459XA Infection and inflammatory reaction due to other internal joint prosthesis, initial encounter: Principal | ICD-10-CM | POA: Insufficient documentation

## 2020-05-27 DIAGNOSIS — M5417 Radiculopathy, lumbosacral region: Secondary | ICD-10-CM | POA: Diagnosis not present

## 2020-05-27 DIAGNOSIS — M25512 Pain in left shoulder: Secondary | ICD-10-CM | POA: Diagnosis present

## 2020-05-27 DIAGNOSIS — Z96653 Presence of artificial knee joint, bilateral: Secondary | ICD-10-CM | POA: Insufficient documentation

## 2020-05-27 DIAGNOSIS — T8459XD Infection and inflammatory reaction due to other internal joint prosthesis, subsequent encounter: Secondary | ICD-10-CM | POA: Diagnosis not present

## 2020-05-27 DIAGNOSIS — F419 Anxiety disorder, unspecified: Secondary | ICD-10-CM | POA: Diagnosis not present

## 2020-05-27 DIAGNOSIS — Z9889 Other specified postprocedural states: Secondary | ICD-10-CM | POA: Diagnosis present

## 2020-05-27 HISTORY — PX: IRRIGATION AND DEBRIDEMENT SHOULDER: SHX5880

## 2020-05-27 SURGERY — IRRIGATION AND DEBRIDEMENT SHOULDER
Anesthesia: General | Site: Shoulder | Laterality: Left

## 2020-05-27 MED ORDER — ALLOPURINOL 300 MG PO TABS
300.0000 mg | ORAL_TABLET | Freq: Every day | ORAL | Status: DC
  Administered 2020-05-28: 300 mg via ORAL
  Filled 2020-05-27: qty 1

## 2020-05-27 MED ORDER — CLONAZEPAM 0.5 MG PO TABS: 0.5000 mg | ORAL_TABLET | Freq: Every day | ORAL | Status: DC | PRN

## 2020-05-27 MED ORDER — HYDROMORPHONE HCL 1 MG/ML IJ SOLN: 0.2500 mg | INTRAMUSCULAR | Status: DC | PRN

## 2020-05-27 MED ORDER — CEFAZOLIN SODIUM-DEXTROSE 2-4 GM/100ML-% IV SOLN
2.0000 g | INTRAVENOUS | Status: AC
  Administered 2020-05-27: 2 g via INTRAVENOUS
  Filled 2020-05-27: qty 100

## 2020-05-27 MED ORDER — FENTANYL CITRATE (PF) 100 MCG/2ML IJ SOLN
INTRAMUSCULAR | Status: AC
  Administered 2020-05-27: 50 ug via INTRAVENOUS
  Filled 2020-05-27: qty 2

## 2020-05-27 MED ORDER — TRANEXAMIC ACID-NACL 1000-0.7 MG/100ML-% IV SOLN
1000.0000 mg | INTRAVENOUS | Status: AC
  Administered 2020-05-27: 1000 mg via INTRAVENOUS
  Filled 2020-05-27: qty 100

## 2020-05-27 MED ORDER — METOCLOPRAMIDE HCL 5 MG/ML IJ SOLN: 5.0000 mg | Freq: Three times a day (TID) | INTRAMUSCULAR | Status: DC | PRN

## 2020-05-27 MED ORDER — ONDANSETRON HCL 4 MG/2ML IJ SOLN
INTRAMUSCULAR | Status: AC
  Filled 2020-05-27: qty 2

## 2020-05-27 MED ORDER — PROPOFOL 10 MG/ML IV BOLUS
INTRAVENOUS | Status: DC | PRN
  Administered 2020-05-27 (×3): 10 mg via INTRAVENOUS
  Administered 2020-05-27: 150 mg via INTRAVENOUS

## 2020-05-27 MED ORDER — PROPOFOL 10 MG/ML IV BOLUS
INTRAVENOUS | Status: AC
  Filled 2020-05-27: qty 20

## 2020-05-27 MED ORDER — BUPIVACAINE HCL (PF) 0.5 % IJ SOLN
INTRAMUSCULAR | Status: DC | PRN
  Administered 2020-05-27: 15 mL via PERINEURAL

## 2020-05-27 MED ORDER — ADULT MULTIVITAMIN W/MINERALS CH
1.0000 | ORAL_TABLET | Freq: Every day | ORAL | Status: DC
  Administered 2020-05-28: 1 via ORAL
  Filled 2020-05-27: qty 1

## 2020-05-27 MED ORDER — FENTANYL CITRATE (PF) 250 MCG/5ML IJ SOLN
INTRAMUSCULAR | Status: AC
  Filled 2020-05-27: qty 5

## 2020-05-27 MED ORDER — CO Q-10 300 MG PO CAPS: 300.0000 mg | ORAL_CAPSULE | Freq: Every day | ORAL | Status: DC

## 2020-05-27 MED ORDER — MIDAZOLAM HCL 2 MG/2ML IJ SOLN
INTRAMUSCULAR | Status: AC
  Administered 2020-05-27: 2 mg via INTRAVENOUS
  Filled 2020-05-27: qty 2

## 2020-05-27 MED ORDER — THYROID 30 MG PO TABS
30.0000 mg | ORAL_TABLET | Freq: Every day | ORAL | Status: DC
  Administered 2020-05-28: 30 mg via ORAL
  Filled 2020-05-27: qty 1

## 2020-05-27 MED ORDER — DOCUSATE SODIUM 100 MG PO CAPS
100.0000 mg | ORAL_CAPSULE | Freq: Two times a day (BID) | ORAL | Status: DC
  Administered 2020-05-27 – 2020-05-28 (×2): 100 mg via ORAL
  Filled 2020-05-27 (×2): qty 1

## 2020-05-27 MED ORDER — DEXAMETHASONE SODIUM PHOSPHATE 10 MG/ML IJ SOLN
INTRAMUSCULAR | Status: AC
  Filled 2020-05-27: qty 1

## 2020-05-27 MED ORDER — HYDROCODONE-ACETAMINOPHEN 5-325 MG PO TABS
1.0000 | ORAL_TABLET | ORAL | Status: DC | PRN
  Filled 2020-05-27: qty 2

## 2020-05-27 MED ORDER — ANASTROZOLE 1 MG PO TABS: 0.5000 mg | ORAL_TABLET | ORAL | Status: DC

## 2020-05-27 MED ORDER — CEFAZOLIN SODIUM-DEXTROSE 1-4 GM/50ML-% IV SOLN
1.0000 g | Freq: Four times a day (QID) | INTRAVENOUS | Status: AC
  Administered 2020-05-27 – 2020-05-28 (×3): 1 g via INTRAVENOUS
  Filled 2020-05-27 (×3): qty 50

## 2020-05-27 MED ORDER — ACETAMINOPHEN 325 MG PO TABS: 325.0000 mg | ORAL_TABLET | Freq: Four times a day (QID) | ORAL | Status: DC | PRN

## 2020-05-27 MED ORDER — AMLODIPINE BESYLATE 10 MG PO TABS
10.0000 mg | ORAL_TABLET | Freq: Every day | ORAL | Status: DC
  Administered 2020-05-27: 10 mg via ORAL
  Filled 2020-05-27: qty 1

## 2020-05-27 MED ORDER — ONDANSETRON HCL 4 MG/2ML IJ SOLN
INTRAMUSCULAR | Status: DC | PRN
  Administered 2020-05-27: 4 mg via INTRAVENOUS

## 2020-05-27 MED ORDER — DEXAMETHASONE SODIUM PHOSPHATE 10 MG/ML IJ SOLN
INTRAMUSCULAR | Status: DC | PRN
  Administered 2020-05-27: 10 mg via INTRAVENOUS

## 2020-05-27 MED ORDER — PROPRANOLOL HCL ER 80 MG PO CP24
80.0000 mg | ORAL_CAPSULE | Freq: Every evening | ORAL | Status: DC
  Administered 2020-05-27: 80 mg via ORAL
  Filled 2020-05-27 (×2): qty 1

## 2020-05-27 MED ORDER — LIDOCAINE 2% (20 MG/ML) 5 ML SYRINGE
INTRAMUSCULAR | Status: DC | PRN
  Administered 2020-05-27: 40 mg via INTRAVENOUS
  Administered 2020-05-27: 60 mg via INTRAVENOUS

## 2020-05-27 MED ORDER — SUGAMMADEX SODIUM 200 MG/2ML IV SOLN
INTRAVENOUS | Status: DC | PRN
  Administered 2020-05-27: 200 mg via INTRAVENOUS

## 2020-05-27 MED ORDER — FENTANYL CITRATE (PF) 250 MCG/5ML IJ SOLN
INTRAMUSCULAR | Status: DC | PRN
  Administered 2020-05-27: 25 ug via INTRAVENOUS
  Administered 2020-05-27: 100 ug via INTRAVENOUS
  Administered 2020-05-27: 50 ug via INTRAVENOUS
  Administered 2020-05-27: 25 ug via INTRAVENOUS

## 2020-05-27 MED ORDER — ROCURONIUM BROMIDE 10 MG/ML (PF) SYRINGE
PREFILLED_SYRINGE | INTRAVENOUS | Status: AC
  Filled 2020-05-27: qty 10

## 2020-05-27 MED ORDER — METOCLOPRAMIDE HCL 5 MG PO TABS: 5.0000 mg | ORAL_TABLET | Freq: Three times a day (TID) | ORAL | Status: DC | PRN

## 2020-05-27 MED ORDER — FENTANYL CITRATE (PF) 100 MCG/2ML IJ SOLN: 50.0000 ug | Freq: Once | INTRAMUSCULAR | Status: AC

## 2020-05-27 MED ORDER — PHENYLEPHRINE HCL-NACL 10-0.9 MG/250ML-% IV SOLN
INTRAVENOUS | Status: DC | PRN
  Administered 2020-05-27: 25 ug/min via INTRAVENOUS

## 2020-05-27 MED ORDER — ONDANSETRON HCL 4 MG PO TABS: 4.0000 mg | ORAL_TABLET | Freq: Four times a day (QID) | ORAL | Status: DC | PRN

## 2020-05-27 MED ORDER — MIDAZOLAM HCL 2 MG/2ML IJ SOLN
INTRAMUSCULAR | Status: AC
  Filled 2020-05-27: qty 2

## 2020-05-27 MED ORDER — BUPIVACAINE LIPOSOME 1.3 % IJ SUSP
INTRAMUSCULAR | Status: DC | PRN
  Administered 2020-05-27: 10 mL via PERINEURAL

## 2020-05-27 MED ORDER — EPHEDRINE 5 MG/ML INJ
INTRAVENOUS | Status: AC
  Filled 2020-05-27: qty 10

## 2020-05-27 MED ORDER — ORAL CARE MOUTH RINSE: 15.0000 mL | Freq: Once | OROMUCOSAL | Status: AC

## 2020-05-27 MED ORDER — TRAMADOL HCL 50 MG PO TABS: 50.0000 mg | ORAL_TABLET | Freq: Four times a day (QID) | ORAL | Status: DC | PRN

## 2020-05-27 MED ORDER — GLYCOPYRROLATE PF 0.2 MG/ML IJ SOSY
PREFILLED_SYRINGE | INTRAMUSCULAR | Status: AC
  Filled 2020-05-27: qty 2

## 2020-05-27 MED ORDER — ONDANSETRON HCL 4 MG/2ML IJ SOLN: 4.0000 mg | Freq: Four times a day (QID) | INTRAMUSCULAR | Status: DC | PRN

## 2020-05-27 MED ORDER — MENTHOL 3 MG MT LOZG
1.0000 | LOZENGE | OROMUCOSAL | Status: DC | PRN
  Filled 2020-05-27: qty 9

## 2020-05-27 MED ORDER — PROMETHAZINE HCL 25 MG/ML IJ SOLN: 6.2500 mg | INTRAMUSCULAR | Status: DC | PRN

## 2020-05-27 MED ORDER — ACETAMINOPHEN 500 MG PO TABS
500.0000 mg | ORAL_TABLET | Freq: Four times a day (QID) | ORAL | Status: AC
  Administered 2020-05-27 – 2020-05-28 (×4): 500 mg via ORAL
  Filled 2020-05-27 (×4): qty 1

## 2020-05-27 MED ORDER — LIDOCAINE HCL (PF) 2 % IJ SOLN
INTRAMUSCULAR | Status: AC
  Filled 2020-05-27: qty 5

## 2020-05-27 MED ORDER — MORPHINE SULFATE (PF) 2 MG/ML IV SOLN: 0.5000 mg | INTRAVENOUS | Status: DC | PRN

## 2020-05-27 MED ORDER — MIDAZOLAM HCL 2 MG/2ML IJ SOLN: 2.0000 mg | Freq: Once | INTRAMUSCULAR | Status: AC

## 2020-05-27 MED ORDER — PHENOL 1.4 % MT LIQD: 1.0000 | OROMUCOSAL | Status: DC | PRN

## 2020-05-27 MED ORDER — CHLORHEXIDINE GLUCONATE 0.12 % MT SOLN
15.0000 mL | Freq: Once | OROMUCOSAL | Status: AC
  Administered 2020-05-27: 15 mL via OROMUCOSAL
  Filled 2020-05-27: qty 15

## 2020-05-27 MED ORDER — HYDROCODONE-ACETAMINOPHEN 7.5-325 MG PO TABS: 1.0000 | ORAL_TABLET | ORAL | Status: DC | PRN

## 2020-05-27 MED ORDER — PANTOPRAZOLE SODIUM 40 MG PO TBEC
40.0000 mg | DELAYED_RELEASE_TABLET | ORAL | Status: DC
  Administered 2020-05-28: 40 mg via ORAL
  Filled 2020-05-27: qty 1

## 2020-05-27 MED ORDER — EPHEDRINE SULFATE-NACL 50-0.9 MG/10ML-% IV SOSY
PREFILLED_SYRINGE | INTRAVENOUS | Status: DC | PRN
  Administered 2020-05-27: 5 mg via INTRAVENOUS

## 2020-05-27 MED ORDER — NEOSTIGMINE METHYLSULFATE 3 MG/3ML IV SOSY
PREFILLED_SYRINGE | INTRAVENOUS | Status: AC
  Filled 2020-05-27: qty 3

## 2020-05-27 MED ORDER — ROCURONIUM BROMIDE 10 MG/ML (PF) SYRINGE
PREFILLED_SYRINGE | INTRAVENOUS | Status: DC | PRN
  Administered 2020-05-27: 50 mg via INTRAVENOUS

## 2020-05-27 MED ORDER — FLUTICASONE PROPIONATE 50 MCG/ACT NA SUSP
2.0000 | Freq: Every day | NASAL | Status: DC | PRN
  Filled 2020-05-27: qty 16

## 2020-05-27 MED ORDER — 0.9 % SODIUM CHLORIDE (POUR BTL) OPTIME
TOPICAL | Status: DC | PRN
  Administered 2020-05-27: 1000 mL

## 2020-05-27 MED ORDER — LACTATED RINGERS IV SOLN: INTRAVENOUS | Status: DC

## 2020-05-27 MED ORDER — CELECOXIB 200 MG PO CAPS
200.0000 mg | ORAL_CAPSULE | Freq: Two times a day (BID) | ORAL | Status: DC
  Administered 2020-05-27 – 2020-05-28 (×3): 200 mg via ORAL
  Filled 2020-05-27 (×3): qty 1

## 2020-05-27 SURGICAL SUPPLY — 51 items
BIT DRILL 5/64X5 DISP (BIT) IMPLANT
BLADE SAG 18X100X1.27 (BLADE) ×2 IMPLANT
COVER SURGICAL LIGHT HANDLE (MISCELLANEOUS) ×2 IMPLANT
COVER WAND RF STERILE (DRAPES) IMPLANT
DRAPE INCISE IOBAN 66X45 STRL (DRAPES) IMPLANT
DRAPE ORTHO SPLIT 77X108 STRL (DRAPES) ×4
DRAPE SURG ORHT 6 SPLT 77X108 (DRAPES) ×2 IMPLANT
DRAPE U-SHAPE 47X51 STRL (DRAPES) ×2 IMPLANT
DRSG ADAPTIC 3X8 NADH LF (GAUZE/BANDAGES/DRESSINGS) ×2 IMPLANT
DRSG PAD ABDOMINAL 8X10 ST (GAUZE/BANDAGES/DRESSINGS) ×2 IMPLANT
DURAPREP 26ML APPLICATOR (WOUND CARE) ×2 IMPLANT
ELECT BLADE 4.0 EZ CLEAN MEGAD (MISCELLANEOUS) ×2
ELECT REM PT RETURN 9FT ADLT (ELECTROSURGICAL) ×2
ELECTRODE BLDE 4.0 EZ CLN MEGD (MISCELLANEOUS) ×1 IMPLANT
ELECTRODE REM PT RTRN 9FT ADLT (ELECTROSURGICAL) ×1 IMPLANT
GAUZE SPONGE 4X4 12PLY STRL (GAUZE/BANDAGES/DRESSINGS) IMPLANT
GAUZE SPONGE 4X4 12PLY STRL LF (GAUZE/BANDAGES/DRESSINGS) ×2 IMPLANT
GLOVE BIO SURGEON STRL SZ7.5 (GLOVE) ×4 IMPLANT
GLOVE BIOGEL PI IND STRL 8 (GLOVE) ×2 IMPLANT
GLOVE BIOGEL PI INDICATOR 8 (GLOVE) ×2
GOWN STRL REUS W/ TWL LRG LVL3 (GOWN DISPOSABLE) IMPLANT
GOWN STRL REUS W/ TWL XL LVL3 (GOWN DISPOSABLE) ×2 IMPLANT
GOWN STRL REUS W/TWL LRG LVL3 (GOWN DISPOSABLE)
GOWN STRL REUS W/TWL XL LVL3 (GOWN DISPOSABLE) ×4
KIT BASIN OR (CUSTOM PROCEDURE TRAY) ×2 IMPLANT
KIT SHOULDER SPACER 7MM STEM (Shoulder) ×2 IMPLANT
KIT TURNOVER KIT B (KITS) ×2 IMPLANT
MANIFOLD NEPTUNE II (INSTRUMENTS) IMPLANT
NS IRRIG 1000ML POUR BTL (IV SOLUTION) ×2 IMPLANT
PACK SHOULDER (CUSTOM PROCEDURE TRAY) ×2 IMPLANT
PAD ABD 8X10 STRL (GAUZE/BANDAGES/DRESSINGS) ×4 IMPLANT
PAD ARMBOARD 7.5X6 YLW CONV (MISCELLANEOUS) ×4 IMPLANT
RESTRAINT HEAD UNIVERSAL NS (MISCELLANEOUS) ×2 IMPLANT
SLING ARM FOAM STRAP LRG (SOFTGOODS) ×2 IMPLANT
SLING ARM FOAM STRAP MED (SOFTGOODS) IMPLANT
SMARTMIX MINI TOWER (MISCELLANEOUS)
SPONGE LAP 18X18 RF (DISPOSABLE) ×2 IMPLANT
SPONGE LAP 4X18 RFD (DISPOSABLE) IMPLANT
STRIP CLOSURE SKIN 1/2X4 (GAUZE/BANDAGES/DRESSINGS) IMPLANT
SUCTION FRAZIER HANDLE 10FR (MISCELLANEOUS) ×2
SUCTION TUBE FRAZIER 10FR DISP (MISCELLANEOUS) ×1 IMPLANT
SUT FIBERWIRE #2 38 T-5 BLUE (SUTURE) ×4
SUT MNCRL AB 4-0 PS2 18 (SUTURE) ×2 IMPLANT
SUT VIC AB 2-0 CT1 27 (SUTURE) ×2
SUT VIC AB 2-0 CT1 TAPERPNT 27 (SUTURE) ×1 IMPLANT
SUTURE FIBERWR #2 38 T-5 BLUE (SUTURE) ×2 IMPLANT
TAPE CLOTH SURG 6X10 WHT LF (GAUZE/BANDAGES/DRESSINGS) ×2 IMPLANT
TOWEL GREEN STERILE (TOWEL DISPOSABLE) ×2 IMPLANT
TOWER SMARTMIX MINI (MISCELLANEOUS) IMPLANT
WATER STERILE IRR 1000ML POUR (IV SOLUTION) ×2 IMPLANT
YANKAUER SUCT BULB TIP NO VENT (SUCTIONS) ×2 IMPLANT

## 2020-05-27 NOTE — Anesthesia Preprocedure Evaluation (Addendum)
Anesthesia Evaluation  Patient identified by MRN, date of birth, ID band Patient awake    Reviewed: Allergy & Precautions, NPO status , Patient's Chart, lab work & pertinent test results  History of Anesthesia Complications Negative for: history of anesthetic complications  Airway Mallampati: I  TM Distance: >3 FB Neck ROM: Full    Dental  (+) Dental Advisory Given, Teeth Intact   Pulmonary neg pulmonary ROS,    Pulmonary exam normal        Cardiovascular hypertension, Pt. on medications and Pt. on home beta blockers Normal cardiovascular exam     Neuro/Psych  Headaches, PSYCHIATRIC DISORDERS Anxiety Depression    GI/Hepatic Neg liver ROS, GERD  Medicated,  Endo/Other   Obesity   Renal/GU Renal InsufficiencyRenal disease     Musculoskeletal  (+) Arthritis ,  Gout    Abdominal   Peds  Hematology negative hematology ROS (+)   Anesthesia Other Findings Covid test negative   Reproductive/Obstetrics                            Anesthesia Physical Anesthesia Plan  ASA: II  Anesthesia Plan: General   Post-op Pain Management:  Regional for Post-op pain   Induction: Intravenous  PONV Risk Score and Plan: 2 and Treatment may vary due to age or medical condition, Ondansetron, Dexamethasone and Midazolam  Airway Management Planned: Oral ETT  Additional Equipment: None  Intra-op Plan:   Post-operative Plan: Extubation in OR  Informed Consent: I have reviewed the patients History and Physical, chart, labs and discussed the procedure including the risks, benefits and alternatives for the proposed anesthesia with the patient or authorized representative who has indicated his/her understanding and acceptance.     Dental advisory given  Plan Discussed with: CRNA, Anesthesiologist and Surgeon  Anesthesia Plan Comments:       Anesthesia Quick Evaluation

## 2020-05-27 NOTE — Progress Notes (Signed)
mandatoryh 5 [place] post of sheet done and will be passed to the night nurse

## 2020-05-27 NOTE — Anesthesia Procedure Notes (Signed)
Procedure Name: Intubation Date/Time: 05/27/2020 10:30 AM Performed by: Verdie Drown, CRNA Pre-anesthesia Checklist: Patient identified, Emergency Drugs available, Suction available and Patient being monitored Patient Re-evaluated:Patient Re-evaluated prior to induction Oxygen Delivery Method: Circle System Utilized Preoxygenation: Pre-oxygenation with 100% oxygen Induction Type: IV induction Ventilation: Mask ventilation without difficulty Laryngoscope Size: Mac and 4 Grade View: Grade I Tube type: Oral Tube size: 7.5 mm Number of attempts: 1 Airway Equipment and Method: Stylet and Oral airway Placement Confirmation: ETT inserted through vocal cords under direct vision,  positive ETCO2 and breath sounds checked- equal and bilateral Secured at: 22 cm Tube secured with: Tape Dental Injury: Teeth and Oropharynx as per pre-operative assessment

## 2020-05-27 NOTE — Brief Op Note (Signed)
05/27/2020  11:50 AM  PATIENT:  Geoffrey West  64 y.o. male  PRE-OPERATIVE DIAGNOSIS:  Left shoulder infection  POST-OPERATIVE DIAGNOSIS:  Same  PROCEDURE:  Left shoulder antibiotic spacer exchange  SURGEON:  [name], MD  ANESTHESIA:   General  COMPLICATIONS: None

## 2020-05-27 NOTE — Anesthesia Postprocedure Evaluation (Signed)
Anesthesia Post Note  Patient: Geoffrey West [facility]  Procedure(s) Performed: Left shoulder antibiotic spacer exchange (Left Shoulder)     Patient location during evaluation: PACU Anesthesia Type: General Level of consciousness: awake and alert Pain management: pain level controlled Vital Signs Assessment: post-procedure vital signs reviewed and stable Respiratory status: spontaneous breathing, nonlabored ventilation and respiratory function stable Cardiovascular status: blood pressure returned to baseline and stable Postop Assessment: no apparent nausea or vomiting Anesthetic complications: no   No complications documented.  Last Vitals:  Vitals:   05/27/20 1300 05/27/20 1328  BP: 138/90 137/85  Pulse: 81 85  Resp: 17 19  Temp:  36.6 C  SpO2: 95% 97%    Last Pain:  Vitals:   05/27/20 1328  TempSrc: Oral  PainSc:                  Audry Pili

## 2020-05-27 NOTE — OR Nursing (Signed)
Pt is awake,alert and oriented.Pt and/or family verbalized understanding of poc and discharge instructions. Reviewed admission and on going care with receiving RN. Pt is in NAD at this time and is ready to be transferred to floor. Will con't to monitor until pt is transferred. Belongings on bed with patient Pt has left shoulder immobilizer in place, pt able to move fingers to the affected arm, + cap refill, pt has no pain to that side

## 2020-05-27 NOTE — Transfer of Care (Signed)
Immediate Anesthesia Transfer of Care Note  Patient: Geoffrey West [facility]  Procedure(s) Performed: Left shoulder antibiotic spacer exchange (Left Shoulder)  Patient Location: PACU  Anesthesia Type:General and Regional  Level of Consciousness: drowsy  Airway & Oxygen Therapy: Patient Spontanous Breathing and Patient connected to nasal cannula oxygen  Post-op Assessment: Report given to RN and Post -op Vital signs reviewed and stable  Post vital signs: Reviewed and stable  Last Vitals:  Vitals Value Taken Time  BP    Temp    Pulse    Resp    SpO2      Last Pain:  Vitals:   05/27/20 1225  TempSrc:   PainSc: (P) Asleep         Complications: No complications documented.

## 2020-05-27 NOTE — H&P (Signed)
ORTHOPAEDIC H and P  REQUESTING PHYSICIAN: [name], MD  PCP:  Aurea Graff, PA-C (Inactive)  Chief Complaint: Left shoulder pain  HPI: Geoffrey West is a 64 y.o. male who complains of pain and dysfunction of his left shoulder.  He has had resection arthroplasty and placement of an antibiotic spacer.  He is actually doing pretty well from an infectious standpoint and has normalized his inflammatory markers.  He has presented today for antibiotic spacer exchange as well as debridement and irrigation.  He has no new complaints at this time.  Past Medical History:  Diagnosis Date  . Anxiety   . Arthritis    "all over" (03/06/2018)  . Close exposure to COVID-19 virus 07/08/2019  . GERD (gastroesophageal reflux disease)   . Gout    "on daily RX" (03/06/2018)  . History of kidney stones     passed x 1  . Hypertension   . Hypertension 12/25/2016  . Loosening of total shoulder replacement (HCC)    left  . Low testosterone 12/25/2016  . Migraine    "haven't had more than 1/year since propanolol started" (03/06/2018)  . Propionibacterium infection 08/28/2019  . 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 Rash 09/17/2019  . Situational depression    "recently lost my mom and brother" (03/06/2018)  . Wears contact lenses   . Wears glasses    Past Surgical History:  Procedure Laterality Date  . BACK SURGERY    . COLONOSCOPY    . COLONOSCOPY W/ BIOPSIES AND POLYPECTOMY    . EXCISIONAL TOTAL SHOULDER ARTHROPLASTY WITH ANTIBIOTIC SPACER Left 03/06/2018   Procedure: Left shoulder antibiotic spacer expalnt with glenoid bone grafting, antiobiotic spacer placement;  Surgeon: [name], MD;  Location: [facility];  Service: Orthopedics;  Laterality: Left;  . EXCISIONAL TOTAL SHOULDER ARTHROPLASTY WITH ANTIBIOTIC SPACER Left 01/06/2020   Procedure: LEFT SHOULDER ARTHROPLASTY EXPLANT WITH ANTIBIOTIC SPACER PLACEMENT;  Surgeon: [name], MD;  Location: [place];   Service: Orthopedics;  Laterality: Left;  2.5 hrs RNFA  . IR FLUORO GUIDE CV LINE RIGHT  12/26/2016  . KNEE ARTHROSCOPY Bilateral    X 10 total arthroscopies  . KNEE CARTILAGE SURGERY Left 1977   "cartilage removed"  . LASIK Bilateral   . LUMBAR DISC SURGERY  2000s   "ruptured disc"  . REVERSE SHOULDER ARTHROPLASTY Left 12/08/2016   Procedure: Revision left total shoulder to reverse total shoulder arthroplasty;  Surgeon: [name], MD;  Location: [place];  Service: Orthopedics;  Laterality: Left;  . REVERSE SHOULDER ARTHROPLASTY Left 01/01/2018   Procedure: Left reverse shouler explant with antibiotic spacer placement;  Surgeon: [name], MD;  Location: [facility];  Service: Orthopedics;  Laterality: Left;  2.5 hrs  . REVERSE SHOULDER ARTHROPLASTY Left 06/11/2018   Procedure: REVERSE TOTAL SHOULDER REVISION;  Surgeon: [name], MD;  Location: [place];  Service: Orthopedics;  Laterality: Left;  . REVERSE SHOULDER ARTHROPLASTY Left 12/05/2018   Procedure: Left reverse shoulder explant, bone grafting;  Surgeon: [name], MD;  Location: [place];  Service: Orthopedics;  Laterality: Left;  2.5 hrs  . REVISION OF TOTAL SHOULDER Left 12/26/2018  . REVISION TOTAL SHOULDER TO REVERSE TOTAL SHOULDER Left 07/16/2018   Procedure: Left shoulder irrigation and debridement with poly and head ball exchange;  Surgeon: [name], MD;  Location: [place];  Service: Orthopedics;  Laterality: Left;  2 hrs  . SHOULDER ARTHROSCOPY Right 1980s/1990s   "bone spur  removed"  . SHOULDER OPEN ROTATOR CUFF REPAIR Right 1970s  . SHOULDER SURGERY Left 03/06/2018   antibiotic spacer expalnt with glenoid bone grafting, antiobiotic spacer placement  . TOTAL KNEE ARTHROPLASTY Bilateral   . TOTAL SHOULDER ARTHROPLASTY Left ~ 2014-2017 X 2   "in [place]"  . TOTAL SHOULDER REPLACEMENT Right ~ 2009   "in [place]"  . TOTAL SHOULDER REVISION Left 12/26/2018   Procedure: Left  prosthetic shoulder 1 component revision;  Surgeon: [name], MD;  Location: [place];  Service: Orthopedics;  Laterality: Left;  90 mins  . WISDOM TOOTH EXTRACTION     Social History   Socioeconomic History  . Marital status: Single    Spouse name: Not on file  . Number of children: Not on file  . Years of education: Not on file  . Highest education level: Not on file  Occupational History  . Not on file  Tobacco Use  . Smoking status: Never Smoker  . Smokeless tobacco: Never Used  Vaping Use  . Vaping Use: Never used  Substance and Sexual Activity  . Alcohol use: Not Currently  . Drug use: Never  . Sexual activity: Not Currently  Other Topics Concern  . Not on file  Social History Narrative  . Not on file   Social Determinants of Health   Financial Resource Strain:   . Difficulty of Paying Living Expenses: Not on file  Food Insecurity:   . Worried About Charity fundraiser in the Last Year: Not on file  . Ran Out of Food in the Last Year: Not on file  Transportation Needs:   . Lack of Transportation (Medical): Not on file  . Lack of Transportation (Non-Medical): Not on file  Physical Activity:   . Days of Exercise per Week: Not on file  . Minutes of Exercise per Session: Not on file  Stress:   . Feeling of Stress : Not on file  Social Connections:   . Frequency of Communication with Friends and Family: Not on file  . Frequency of Social Gatherings with Friends and Family: Not on file  . Attends Religious Services: Not on file  . Active Member of Clubs or Organizations: Not on file  . Attends Archivist Meetings: Not on file  . Marital Status: Not on file   Family History  Problem Relation Age of Onset  . Arthritis Mother   . Arthritis Sister   . Alzheimer's disease Brother   . Arthritis Brother    Allergies  Allergen Reactions  . Fentanyl Shortness Of Breath and Other (See Comments)    Reaction to [facility] ONLY > ? DOSE REGULATION ?   [name]  Oxycodone Other (See Comments)    Pt states he goes through hot and cold flashes withdrawal like symptoms   Prior to Admission medications   Medication Sig Start Date End Date Taking? Authorizing Provider  allopurinol (ZYLOPRIM) 300 MG tablet Take 300 mg by mouth daily.   Yes [provider]  amLODipine (NORVASC) 10 MG tablet Take 10 mg by mouth at bedtime.    Yes [provider]  anastrozole (ARIMIDEX) 1 MG tablet Take 0.5 mg by mouth every Wednesday.  08/10/18  Yes [provider]  ARMOUR THYROID 30 MG tablet Take 30 mg by mouth daily before breakfast.  09/22/19  Yes [provider]  celecoxib (CELEBREX) 200 MG capsule Take 200 mg by mouth 2 (two) times daily.   Yes [provider]  clonazePAM Bobbye [place])  1 MG tablet Take 0.5-1 mg by mouth daily as needed (anxiety/panic attacks).    Yes [provider]  Coenzyme Q10 (CO Q-10) 300 MG CAPS Take 300 mg by mouth daily.   Yes [provider]  fluticasone (FLONASE) 50 MCG/ACT nasal spray Place 2 sprays into both nostrils daily as needed for allergies or rhinitis.   Yes [provider]  Multiple Vitamin (MULTIVITAMIN WITH MINERALS) TABS tablet Take 1 tablet by mouth daily.   Yes [provider]  pantoprazole (PROTONIX) 40 MG tablet Take 40 mg by mouth every morning.    Yes [provider]  propranolol ER (INDERAL LA) 80 MG 24 hr capsule Take 80 mg by mouth every evening.  02/01/18  Yes [provider]  tacrolimus (PROTOPIC) 0.1 % ointment Apply 1 application topically at bedtime. 12/18/19  Yes [provider]  testosterone cypionate (DEPOTESTOSTERONE CYPIONATE) 200 MG/ML injection Inject 140 mg into the muscle every Wednesday.    Yes [provider]  diphenhydrAMINE (BENADRYL) 25 MG tablet Take 25 mg by mouth daily as needed for allergies.    [provider]  diphenhydramine-acetaminophen (TYLENOL PM) 25-500 MG TABS tablet Take 1  tablet by mouth at bedtime as needed (sleep).    [provider]  doxycycline (VIBRA-TABS) 100 MG tablet Take 1 tablet (100 mg total) by mouth 2 (two) times daily. Patient not taking: Reported on 05/14/2020 02/11/20   Campbell Riches, MD   No results found.  Positive ROS: All other systems have been reviewed and were otherwise negative with the exception of those mentioned in the HPI and as above.  Physical Exam: General: Alert, no acute distress Cardiovascular: No pedal edema Respiratory: No cyanosis, no use of accessory musculature GI: No organomegaly, abdomen is soft and non-tender Skin: No lesions in the area of chief complaint Neurologic: Sensation intact distally Psychiatric: Patient is competent for consent with normal mood and affect Lymphatic: No axillary or cervical lymphadenopathy  MUSCULOSKELETAL:  Left shoulder:  He has deltopectoral incision.  No signs of active infection.  Distally neurovascularly intact.  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  This is a planned and staged procedure with anticipation of future reimplantation in the upcoming year.  -We again reviewed the risk and benefits of this procedure at length.  We will tentatively plan for 1 night here due to his history of pain control issues as well as postoperative wound drainage to allow for that to happen while in the inpatient setting.  And likely discharge home tomorrow.    [name], MD Cell [phone]    05/27/2020 9:53 AM

## 2020-05-27 NOTE — Op Note (Signed)
Date of Surgery: 05/27/2020  INDICATIONS: Mr. Willcutt is a 64 y.o.-year-old male with a left chronically infected left shoulder.  He is here today for antibiotic spacer exchange on an elective basis.  All inflammatory and infectious markers have normalized over time.  He has tolerated his current antibiotic spacer well, with the exception of some functional issues.  We have elected to exchange this to redose his deep space antibiotics.;  The patient did consent to the procedure after discussion of the risks and benefits.  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  PROCEDURE:  1. Left shoulder open irrigation and debridement of skin, subcutaneous tissue, muscle, and bone. 2.  Exchange of deep shoulder antibiotic spacer, 1 component on the humeral side with antibiotic spacer.  SURGEON: Geralynn Rile, M.D.  ASSIST: Jonelle Sidle, PA-C  Assistant attestation: PA Mcclung was utilized throughout the procedure for positioning the patient, approach to the deep spaces of the shoulder, explantation, irrigation and debridement, as well as implantation of new component..  ANESTHESIA:  General with interscalene block  IV FLUIDS AND URINE: See anesthesia.  ESTIMATED BLOOD LOSS: 50 mL.  IMPLANTS:  [employer] antibiotic impregnated shoulder spacer head size 46  DRAINS: None  COMPLICATIONS: None.  DESCRIPTION OF PROCEDURE: The patient was brought to the operating room and placed supine on the operating table.  The patient had been signed prior to the procedure and this was documented. The patient had the anesthesia placed by the anesthesiologist.  He was then positioned in the beachchair position.  A time-out was performed to confirm that this was the correct patient, site, side and location. The patient did receive antibiotics prior to the incision and was re-dosed during the procedure as  needed at indicated intervals.  A tourniquet was not placed.  The patient had the operative extremity prepped and draped in the standard surgical fashion.     We utilized the previously established deltopectoral incision.  Dissection was carried down through subcutaneous scar tissue into the intermuscular plane.  Upon entry into the deep space some clear, noninfectious appearing fluid was encountered.  This was cultured for aerobic and anaerobic bacteria as well as Gram stain.  Next, we dislocated the humerus.  We performed releases around the humerus.  We did note that there was fairly robust pseudocapsule that had formed around the glenoid anteriorly and laterally.  This also had produced a nice rind of tissue on the glenoid face.  We then began the explantation of the humeral stem.  There was some obvious loosening noted as the stem was pistoning up and down in the wound.  There was no fracture of the stem.  This explanted quite easily.  Next, we turned our attention to the excisional irrigation and debridement of tissue.  We utilized electrocautery, knife, and rondure to debride fibrous tissue from the wound.  We also debrided the skin, subcutaneous tissue, muscle, and bone through the incision.  Glenoid was curetted and Cobb elevator was used to remove fibrous tissue.  There was no gross appearance of infection.  Lastly, we templated the antibiotic spacer with the shoulder as well as hip options.  The smallest of the hip options was noted to be a bit too large and set to proud.  We did not feel comfortable with that as it was not able to be reduced.  We then elected to go with the 46 size head shoulder trial.  The final  implants were opened.  The wound was again copiously irrigated with normal saline.  We then began to cure antibiotic impregnated cement on the back table.  This was allowed to become a little bit tacky.  We then applied a minimal amount of cement around the proximal aspect of the stem  at the aperture of the humerus.  We then placed the final implant and held in about 30 degrees retroversion.  We allowed this to cure.  Once this was cured we reduced the shoulder.  This did seem to have better soft tissue tension than her previous construct.  We irrigated the wound one final time.  Then began closure.  Deep space was closed with #1 PDS.  Subcutaneous tissue closed with interrupted 2-0 PDS.  Skin closed with 3-0 nylon in horizontal mattress fashion.  Arm was cleaned and dried and sterile dressing was applied.  The arm was placed in a sling.  Patient was awakened from anesthesia with no noted complications.  All counts were correct x2.  Was transported PACU in stable condition.  POSTOPERATIVE PLAN:  [name] will be nonweightbearing to the left upper extremity and placed in his sling.  He will don the sling for the next 2 weeks with no weightbearing.  We will see him in the office for suture removal in 2 to 3 weeks for we will allow motion through the arm and minimal lifting.  He will be admitted over the night for pain control and monitoring.

## 2020-05-27 NOTE — Anesthesia Procedure Notes (Signed)
Anesthesia Regional Block: Interscalene brachial plexus block   Pre-Anesthetic Checklist: ,, timeout performed, Correct Patient, Correct Site, Correct Laterality, Correct Procedure, Correct Position, site marked, Risks and benefits discussed,  Surgical consent,  Pre-op evaluation,  At surgeon's request and post-op pain management  Laterality: Left  Prep: chloraprep       Needles:  Injection technique: Single-shot  Needle Type: Echogenic Needle     Needle Length: 5cm  Needle Gauge: 21     Additional Needles:   Narrative:  Start time: 05/27/2020 9:23 AM End time: 05/27/2020 9:26 AM Injection made incrementally with aspirations every 5 mL.  Performed by: Personally  Anesthesiologist: Audry Pili, MD  Additional Notes: No pain on injection. No increased resistance to injection. Injection made in 5cc increments. Good needle visualization. Patient tolerated the procedure well.

## 2020-05-28 ENCOUNTER — Encounter (HOSPITAL_COMMUNITY): Payer: Self-pay | Admitting: Orthopedic Surgery

## 2020-05-28 DIAGNOSIS — T8459XA Infection and inflammatory reaction due to other internal joint prosthesis, initial encounter: Secondary | ICD-10-CM | POA: Diagnosis not present

## 2020-05-28 NOTE — Progress Notes (Signed)
Discharge instructions addressed;pt in stable condition; Pt just waiting for his ride home.He said his niece would come pick him up.

## 2020-05-28 NOTE — Discharge Summary (Addendum)
Patient ID: Geoffrey West MRN: [id number] DOB/AGE: 1956/06/13 64 y.o.  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 Left shoulder open irrigation and debridement of skin, subcutaneous tissue, muscle, and bone. 2.  Exchange of deep shoulder antibiotic spacer, 1 component on the humeral side with antibiotic spacer. Past Medical History:  Diagnosis Date  . Anxiety   . Arthritis    "all over" (03/06/2018)  . Close exposure to COVID-19 virus 07/08/2019  . GERD (gastroesophageal reflux disease)   . Gout    "on daily RX" (03/06/2018)  . History of kidney stones     passed x 1  . Hypertension   . Hypertension 12/25/2016  . Loosening of total shoulder replacement (HCC)    left  . Low testosterone 12/25/2016  . Migraine    "haven't had more than 1/year since propanolol started" (03/06/2018)  . Propionibacterium infection 08/28/2019  . 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 Rash 09/17/2019  . Situational depression    "recently lost my mom and brother" (03/06/2018)  . Wears contact lenses   . Wears glasses    Discharge Diagnoses:   Active Problems:   History of removal of joint prosthesis of left shoulder due to infection  Estimated body mass index is 29.29 kg/m as calculated from the following:   Height as of this encounter: 5\' 11"  (1.803 m).   Weight as of this encounter: 95.3 kg.  Procedure:  Procedure(s) (LRB): Left shoulder antibiotic spacer exchange (Left)   Consults: None  HPI: Geoffrey West is a 64 year old male who was admitted to the hospital following Left shoulder open irrigation and debridement of skin, subcutaneous tissue, muscle, and bone.  Exchange of deep shoulder antibiotic spacer, 1 component on the humeral side with antibiotic spacer.He has had numerous surgeries on his left shoulder and hardware removal for a left  chronically infected left shoulder.  He elected for an ecchymotic spacer exchange and prior to admission had no increases in his inflammatory infectious markers.  Elected to exchange his implant to reduce his deep space antibiotics and for increased function. Intraoperative cultures were taken and spacer exchanged.  He is admitted to hospital with no events overnight and is ready for discharge today.  He reports no significant pain.  His block is still partially active but wearing off.  He is in a sling. Laboratory Data: Admission on 05/27/2020  Component Date Value Ref Range Status  . Specimen Description 05/27/2020 WOUND LEFT SHOULDER   Final  . Special Requests 05/27/2020 NONE   Final  . Gram Stain 05/27/2020    Final                   Value:RARE WBC PRESENT,BOTH PMN AND MONONUCLEAR NO ORGANISMS SEEN Performed at [facility], [place] [address], [place], [place] [address]   . Culture 05/27/2020 PENDING   Incomplete  . Report Status 05/27/2020 PENDING   Incomplete  Hospital Outpatient Visit on 05/25/2020  Component Date Value Ref Range Status  . SARS Coronavirus 2 05/25/2020 NEGATIVE  NEGATIVE Final   Comment: (NOTE) SARS-CoV-2 target nucleic acids are NOT DETECTED.  The SARS-CoV-2 RNA is generally detectable in upper and lower respiratory specimens during the acute phase of infection. Negative results do not preclude SARS-CoV-2 infection, do not rule out co-infections with other pathogens, and should not be used as the sole basis for treatment or other patient  management decisions. Negative results must be combined with clinical observations, patient history, and epidemiological information. The expected result is Negative.  Fact Sheet for Patients: SugarRoll.be  Fact Sheet for Healthcare Providers: https://www.woods-mathews.com/  This test is not yet approved or cleared by the [place] FDA and  has been authorized for detection  and/or diagnosis of SARS-CoV-2 by FDA under an Emergency Use Authorization (EUA). This EUA will remain  in effect (meaning this test can be used) for the duration of the COVID-19 declaration under Se                          ction 564(b)(1) of the Act, 21 U.S.C. section 360bbb-3(b)(1), unless the authorization is terminated or revoked sooner.  Performed at [facility], [place] [address] [place], [place] [address]   Hospital Outpatient Visit on 05/19/2020  Component Date Value Ref Range Status  . Sodium 05/19/2020 140  135 - 145 mmol/L Final  . Potassium 05/19/2020 3.7  3.5 - 5.1 mmol/L Final  . Chloride 05/19/2020 102  98 - 111 mmol/L Final  . CO2 05/19/2020 27  22 - 32 mmol/L Final  . Glucose, Bld 05/19/2020 103* 70 - 99 mg/dL Final   Glucose reference range applies only to samples taken after fasting for at least 8 hours.  . BUN 05/19/2020 17  8 - 23 mg/dL Final  . Creatinine, Ser 05/19/2020 1.28* 0.61 - 1.24 mg/dL Final  . Calcium 05/19/2020 9.9  8.9 - 10.3 mg/dL Final  . GFR, Estimated 05/19/2020 >60  >60 mL/min Final   Comment: (NOTE) Calculated using the CKD-EPI Creatinine Equation (2021)   . Anion gap 05/19/2020 11  5 - 15 Final   Performed at [place] [address], [place], [place] [address]  . WBC 05/19/2020 5.0  4.0 - 10.5 K/uL Final  . RBC 05/19/2020 5.55  4.22 - 5.81 MIL/uL Final  . Hemoglobin 05/19/2020 17.7* 13.0 - 17.0 g/dL Final  . HCT 05/19/2020 53.1* 39 - 52 % Final  . MCV 05/19/2020 95.7  80.0 - 100.0 fL Final  . MCH 05/19/2020 31.9  26.0 - 34.0 pg Final  . MCHC 05/19/2020 33.3  30.0 - 36.0 g/dL Final  . RDW 05/19/2020 13.7  11.5 - 15.5 % Final  . Platelets 05/19/2020 168  150 - 400 K/uL Final  . nRBC 05/19/2020 0.0  0.0 - 0.2 % Final   Performed at [place] [address], [place],  [address]     X-Rays:No results found.  EKG: Orders placed or performed during the hospital encounter of 01/01/20  . EKG 12 lead per  protocol  . EKG 12 lead per protocol     Hospital Course: Geoffrey West is a 64 y.o. who was admitted to Hospital. They were brought to the operating room on 05/27/2020 and underwent Procedure(s): Left shoulder antibiotic spacer exchange.  Patient tolerated the procedure well and was later transferred to the recovery room and then to the orthopaedic floor for postoperative care.  They were given PO and IV analgesics for pain control following their surgery.  They were given 24 hours of postoperative antibiotics of Ancef.  Anti-infectives (From admission, onward)   Start     Dose/Rate Route Frequency Ordered Stop   05/27/20 1600  ceFAZolin (ANCEF) IVPB 1 g/50 mL premix        1 g 100 mL/hr over 30 Minutes Intravenous Every 6 hours 05/27/20  1357 05/28/20 0549   05/27/20 0815  ceFAZolin (ANCEF) IVPB 2g/100 mL premix        2 g 200 mL/hr over 30 Minutes Intravenous On call to O.R. 05/27/20 0804 05/27/20 1021    .  Patient had an uneventful night on the evening of surgery.  Patient was seen in rounds with no significant drainage or effusion of the left shoulder.  No spreading redness or erythema.  His block is still partially active with some decreased sensation in the thumb and over the deltoid.  He has full active motion of the wrist.  He has active assisted elbow motion, he does not have full active motion secondary to his block.  His initial postoperative dressing was removed and showed no signs of infection or drainage.  This was replaced with an Aquacel dressing.  Incision was healing well with nylon sutures in place..  Patient was seen in rounds and was ready to go home.   Diet: Regular diet Activity:NWB left upper extremity Follow-up:in 2 weeks Disposition - Home Discharged Condition: good   Discharge Instructions    Call MD / Call 911   Complete by: As directed    If you experience chest pain or shortness of breath, CALL 911 and be transported to the hospital emergency room.  If you  develope a fever above 101 F, pus (white drainage) or increased drainage or redness at the wound, or calf pain, call your surgeon's office.   Constipation Prevention   Complete by: As directed    Drink plenty of fluids.  Prune juice may be helpful.  You may use a stool softener, such as Colace (over the counter) 100 mg twice a day.  Use MiraLax (over the counter) for constipation as needed.   Diet - low sodium heart healthy   Complete by: As directed    Increase activity slowly as tolerated   Complete by: As directed    Lifting restrictions   Complete by: As directed    No lifting or ROM of the left shoulder until re-evaluation in 2 weeks. Remain in the sling.     Allergies as of 05/28/2020      Reactions   Fentanyl Shortness Of Breath, Other (See Comments)   Reaction to [facility] ONLY > ? DOSE REGULATION ?   Oxycodone Other (See Comments)   Pt states he goes through hot and cold flashes withdrawal like symptoms      Medication List    TAKE these medications   allopurinol 300 MG tablet Commonly known as: ZYLOPRIM Take 300 mg by mouth daily.   amLODipine 10 MG tablet Commonly known as: NORVASC Take 10 mg by mouth at bedtime.   anastrozole 1 MG tablet Commonly known as: ARIMIDEX Take 0.5 mg by mouth every Wednesday.   Armour Thyroid 30 MG tablet Generic drug: thyroid Take 30 mg by mouth daily before breakfast.   celecoxib 200 MG capsule Commonly known as: CELEBREX Take 200 mg by mouth 2 (two) times daily.   clonazePAM 1 MG tablet Commonly known as: KLONOPIN Take 0.5-1 mg by mouth daily as needed (anxiety/panic attacks).   Co Q-10 300 MG Caps Take 300 mg by mouth daily.   diphenhydrAMINE 25 MG tablet Commonly known as: BENADRYL Take 25 mg by mouth daily as needed for allergies.   diphenhydramine-acetaminophen 25-500 MG Tabs tablet Commonly known as: TYLENOL PM Take 1 tablet by mouth at bedtime as needed (sleep).   doxycycline 100 MG tablet Commonly known as:  VIBRA-TABS  Take 1 tablet (100 mg total) by mouth 2 (two) times daily.   fluticasone 50 MCG/ACT nasal spray Commonly known as: FLONASE Place 2 sprays into both nostrils daily as needed for allergies or rhinitis.   multivitamin with minerals Tabs tablet Take 1 tablet by mouth daily.   pantoprazole 40 MG tablet Commonly known as: PROTONIX Take 40 mg by mouth every morning.   propranolol ER 80 MG 24 hr capsule Commonly known as: INDERAL LA Take 80 mg by mouth every evening.   tacrolimus 0.1 % ointment Commonly known as: PROTOPIC Apply 1 application topically at bedtime.   testosterone cypionate 200 MG/ML injection Commonly known as: DEPOTESTOSTERONE CYPIONATE Inject 140 mg into the muscle every Wednesday.        Signed: Jonelle Sidle PA-C Orthopaedic Surgery 05/28/2020, 8:05 AM

## 2020-05-28 NOTE — Plan of Care (Signed)

## 2020-05-28 NOTE — Discharge Instructions (Signed)
ORTHOPEDIC DISCHARGE INSTRUCTIONS:   - Remain in your sling for the next 2 weeks, Coming out of the sling only for hygiene and elbow, wrist, and digit range of motion.  -You  will be nonweightbearing to the left arm and no lifting or motion using the left shoulder, you can move your elbow, wrist, and fingers.   -You have a waterproof bandage that can will remain on until reevaluation in 2 weeks at our office. You have  Given a second bandage to replace this if there are any issues.  Notify the office if there is any significant concern such as fever, chills, worsening symptoms  -We discussed pain management in the hospital, you will take the tramadol you have at home. As needed for worsening pain, you take Tylenol and ibuprofen as needed as well.  - Return to the clinic at [facility] with [facility] or Dr. [name] in 2 weeks for suture removal and wound check. [phone] - call for appointment and any concerns.

## 2020-06-01 LAB — AEROBIC/ANAEROBIC CULTURE W GRAM STAIN (SURGICAL/DEEP WOUND): Culture: NO GROWTH

## 2020-06-14 DIAGNOSIS — B349 Viral infection, unspecified: Secondary | ICD-10-CM | POA: Diagnosis not present

## 2020-06-30 DIAGNOSIS — E291 Testicular hypofunction: Secondary | ICD-10-CM | POA: Diagnosis not present

## 2020-06-30 DIAGNOSIS — E039 Hypothyroidism, unspecified: Secondary | ICD-10-CM | POA: Diagnosis not present

## 2020-06-30 DIAGNOSIS — Z79890 Hormone replacement therapy (postmenopausal): Secondary | ICD-10-CM | POA: Diagnosis not present

## 2020-07-02 DIAGNOSIS — M25512 Pain in left shoulder: Secondary | ICD-10-CM | POA: Diagnosis not present

## 2020-07-02 DIAGNOSIS — E291 Testicular hypofunction: Secondary | ICD-10-CM | POA: Diagnosis not present

## 2020-07-02 DIAGNOSIS — Z6829 Body mass index (BMI) 29.0-29.9, adult: Secondary | ICD-10-CM | POA: Diagnosis not present

## 2020-07-02 DIAGNOSIS — E039 Hypothyroidism, unspecified: Secondary | ICD-10-CM | POA: Diagnosis not present

## 2020-08-04 DIAGNOSIS — J309 Allergic rhinitis, unspecified: Secondary | ICD-10-CM | POA: Diagnosis not present

## 2020-08-05 DIAGNOSIS — J309 Allergic rhinitis, unspecified: Secondary | ICD-10-CM | POA: Diagnosis not present

## 2020-08-12 ENCOUNTER — Telehealth: Payer: Self-pay | Admitting: Unknown Physician Specialty

## 2020-08-12 DIAGNOSIS — E291 Testicular hypofunction: Secondary | ICD-10-CM | POA: Diagnosis not present

## 2020-08-12 DIAGNOSIS — M3581 Multisystem inflammatory syndrome: Secondary | ICD-10-CM | POA: Diagnosis not present

## 2020-08-12 DIAGNOSIS — M3119 Other thrombotic microangiopathy: Secondary | ICD-10-CM | POA: Diagnosis not present

## 2020-08-12 DIAGNOSIS — U099 Post covid-19 condition, unspecified: Secondary | ICD-10-CM | POA: Diagnosis not present

## 2020-08-12 DIAGNOSIS — E559 Vitamin D deficiency, unspecified: Secondary | ICD-10-CM | POA: Diagnosis not present

## 2020-08-12 NOTE — Telephone Encounter (Signed)
Called to discuss with patient about COVID-19 symptoms and the use of one of the available treatments for those with mild to moderate Covid symptoms and at a high risk of hospitalization.  Pt appears to qualify for outpatient treatment due to co-morbid conditions and/or a member of an at-risk group in accordance with the FDA Emergency Use Authorization.    Sx onset >10 days.  Supportive care discussed  Kathrine Haddock

## 2020-08-23 DIAGNOSIS — M311 Thrombotic microangiopathy, unspecified: Secondary | ICD-10-CM | POA: Diagnosis not present

## 2020-08-23 DIAGNOSIS — M3119 Other thrombotic microangiopathy: Secondary | ICD-10-CM | POA: Diagnosis not present

## 2020-08-23 DIAGNOSIS — R739 Hyperglycemia, unspecified: Secondary | ICD-10-CM | POA: Diagnosis not present

## 2020-09-09 DIAGNOSIS — E559 Vitamin D deficiency, unspecified: Secondary | ICD-10-CM | POA: Diagnosis not present

## 2020-09-09 DIAGNOSIS — U099 Post covid-19 condition, unspecified: Secondary | ICD-10-CM | POA: Diagnosis not present

## 2020-09-09 DIAGNOSIS — A493 Mycoplasma infection, unspecified site: Secondary | ICD-10-CM | POA: Diagnosis not present

## 2020-09-09 DIAGNOSIS — M3119 Other thrombotic microangiopathy: Secondary | ICD-10-CM | POA: Diagnosis not present

## 2020-09-09 DIAGNOSIS — M3581 Multisystem inflammatory syndrome: Secondary | ICD-10-CM | POA: Diagnosis not present

## 2020-09-27 DIAGNOSIS — T8459XD Infection and inflammatory reaction due to other internal joint prosthesis, subsequent encounter: Secondary | ICD-10-CM | POA: Diagnosis not present

## 2020-09-29 DIAGNOSIS — E039 Hypothyroidism, unspecified: Secondary | ICD-10-CM | POA: Diagnosis not present

## 2020-09-29 DIAGNOSIS — Z87898 Personal history of other specified conditions: Secondary | ICD-10-CM | POA: Diagnosis not present

## 2020-09-29 DIAGNOSIS — Z79890 Hormone replacement therapy (postmenopausal): Secondary | ICD-10-CM | POA: Diagnosis not present

## 2020-09-29 DIAGNOSIS — E291 Testicular hypofunction: Secondary | ICD-10-CM | POA: Diagnosis not present

## 2020-10-06 DIAGNOSIS — Z6830 Body mass index (BMI) 30.0-30.9, adult: Secondary | ICD-10-CM | POA: Diagnosis not present

## 2020-10-06 DIAGNOSIS — A493 Mycoplasma infection, unspecified site: Secondary | ICD-10-CM | POA: Diagnosis not present

## 2020-10-06 DIAGNOSIS — E039 Hypothyroidism, unspecified: Secondary | ICD-10-CM | POA: Diagnosis not present

## 2020-10-06 DIAGNOSIS — M3119 Other thrombotic microangiopathy: Secondary | ICD-10-CM | POA: Diagnosis not present

## 2020-10-06 DIAGNOSIS — I10 Essential (primary) hypertension: Secondary | ICD-10-CM | POA: Diagnosis not present

## 2020-10-06 DIAGNOSIS — M2559 Pain in other specified joint: Secondary | ICD-10-CM | POA: Diagnosis not present

## 2020-10-06 DIAGNOSIS — E291 Testicular hypofunction: Secondary | ICD-10-CM | POA: Diagnosis not present

## 2020-10-29 DIAGNOSIS — Z96612 Presence of left artificial shoulder joint: Secondary | ICD-10-CM | POA: Diagnosis not present

## 2020-10-29 DIAGNOSIS — T8459XD Infection and inflammatory reaction due to other internal joint prosthesis, subsequent encounter: Secondary | ICD-10-CM | POA: Diagnosis not present

## 2020-11-02 ENCOUNTER — Other Ambulatory Visit: Payer: Self-pay | Admitting: Orthopedic Surgery

## 2020-11-02 DIAGNOSIS — Z96612 Presence of left artificial shoulder joint: Secondary | ICD-10-CM

## 2020-11-04 DIAGNOSIS — E559 Vitamin D deficiency, unspecified: Secondary | ICD-10-CM | POA: Diagnosis not present

## 2020-11-04 DIAGNOSIS — M3119 Other thrombotic microangiopathy: Secondary | ICD-10-CM | POA: Diagnosis not present

## 2020-11-04 DIAGNOSIS — A493 Mycoplasma infection, unspecified site: Secondary | ICD-10-CM | POA: Diagnosis not present

## 2020-11-06 ENCOUNTER — Ambulatory Visit
Admission: RE | Admit: 2020-11-06 | Discharge: 2020-11-06 | Disposition: A | Discharge status: Home / Self Care | Payer: PPO | Source: Ambulatory Visit | Attending: Orthopedic Surgery | Admitting: Orthopedic Surgery

## 2020-11-06 ENCOUNTER — Other Ambulatory Visit: Payer: Self-pay

## 2020-11-06 DIAGNOSIS — Z96612 Presence of left artificial shoulder joint: Secondary | ICD-10-CM

## 2020-11-06 DIAGNOSIS — M25412 Effusion, left shoulder: Secondary | ICD-10-CM | POA: Diagnosis not present

## 2020-11-06 DIAGNOSIS — M6259 Muscle wasting and atrophy, not elsewhere classified, multiple sites: Secondary | ICD-10-CM | POA: Diagnosis not present

## 2020-11-06 DIAGNOSIS — R600 Localized edema: Secondary | ICD-10-CM | POA: Diagnosis not present

## 2020-11-06 DIAGNOSIS — M7989 Other specified soft tissue disorders: Secondary | ICD-10-CM | POA: Diagnosis not present

## 2020-11-17 ENCOUNTER — Other Ambulatory Visit: Payer: PPO

## 2020-11-30 DIAGNOSIS — Z96612 Presence of left artificial shoulder joint: Secondary | ICD-10-CM | POA: Diagnosis not present

## 2020-12-06 DIAGNOSIS — M7989 Other specified soft tissue disorders: Secondary | ICD-10-CM | POA: Diagnosis not present

## 2020-12-06 DIAGNOSIS — Z9889 Other specified postprocedural states: Secondary | ICD-10-CM | POA: Diagnosis not present

## 2020-12-09 DIAGNOSIS — M25512 Pain in left shoulder: Secondary | ICD-10-CM | POA: Diagnosis not present

## 2020-12-09 DIAGNOSIS — Z96612 Presence of left artificial shoulder joint: Secondary | ICD-10-CM | POA: Diagnosis not present

## 2020-12-09 DIAGNOSIS — Z9889 Other specified postprocedural states: Secondary | ICD-10-CM | POA: Diagnosis not present

## 2020-12-10 DIAGNOSIS — M3119 Other thrombotic microangiopathy: Secondary | ICD-10-CM | POA: Diagnosis not present

## 2020-12-10 DIAGNOSIS — A493 Mycoplasma infection, unspecified site: Secondary | ICD-10-CM | POA: Diagnosis not present

## 2020-12-10 DIAGNOSIS — M2559 Pain in other specified joint: Secondary | ICD-10-CM | POA: Diagnosis not present

## 2020-12-10 DIAGNOSIS — R739 Hyperglycemia, unspecified: Secondary | ICD-10-CM | POA: Diagnosis not present

## 2020-12-15 DIAGNOSIS — Z9889 Other specified postprocedural states: Secondary | ICD-10-CM | POA: Diagnosis not present

## 2020-12-15 DIAGNOSIS — G8929 Other chronic pain: Secondary | ICD-10-CM | POA: Diagnosis not present

## 2020-12-15 DIAGNOSIS — M25512 Pain in left shoulder: Secondary | ICD-10-CM | POA: Diagnosis not present

## 2020-12-24 DIAGNOSIS — A493 Mycoplasma infection, unspecified site: Secondary | ICD-10-CM | POA: Diagnosis not present

## 2021-01-03 DIAGNOSIS — Z9889 Other specified postprocedural states: Secondary | ICD-10-CM | POA: Diagnosis not present

## 2021-01-03 DIAGNOSIS — Z96612 Presence of left artificial shoulder joint: Secondary | ICD-10-CM | POA: Diagnosis not present

## 2021-01-03 DIAGNOSIS — M25512 Pain in left shoulder: Secondary | ICD-10-CM | POA: Diagnosis not present

## 2021-01-03 DIAGNOSIS — Z471 Aftercare following joint replacement surgery: Secondary | ICD-10-CM | POA: Diagnosis not present

## 2021-01-03 DIAGNOSIS — Z96611 Presence of right artificial shoulder joint: Secondary | ICD-10-CM | POA: Diagnosis not present

## 2021-01-03 DIAGNOSIS — M25511 Pain in right shoulder: Secondary | ICD-10-CM | POA: Diagnosis not present

## 2021-01-05 DIAGNOSIS — E039 Hypothyroidism, unspecified: Secondary | ICD-10-CM | POA: Diagnosis not present

## 2021-01-05 DIAGNOSIS — E291 Testicular hypofunction: Secondary | ICD-10-CM | POA: Diagnosis not present

## 2021-01-05 DIAGNOSIS — Z79890 Hormone replacement therapy (postmenopausal): Secondary | ICD-10-CM | POA: Diagnosis not present

## 2021-01-07 DIAGNOSIS — R5383 Other fatigue: Secondary | ICD-10-CM | POA: Diagnosis not present

## 2021-01-07 DIAGNOSIS — E291 Testicular hypofunction: Secondary | ICD-10-CM | POA: Diagnosis not present

## 2021-01-07 DIAGNOSIS — E039 Hypothyroidism, unspecified: Secondary | ICD-10-CM | POA: Diagnosis not present

## 2021-01-07 DIAGNOSIS — Z6830 Body mass index (BMI) 30.0-30.9, adult: Secondary | ICD-10-CM | POA: Diagnosis not present

## 2021-01-14 DIAGNOSIS — M3581 Multisystem inflammatory syndrome: Secondary | ICD-10-CM | POA: Diagnosis not present

## 2021-01-14 DIAGNOSIS — U099 Post covid-19 condition, unspecified: Secondary | ICD-10-CM | POA: Diagnosis not present

## 2021-01-14 DIAGNOSIS — M3119 Other thrombotic microangiopathy: Secondary | ICD-10-CM | POA: Diagnosis not present

## 2021-02-01 DIAGNOSIS — I10 Essential (primary) hypertension: Secondary | ICD-10-CM | POA: Diagnosis not present

## 2021-02-01 DIAGNOSIS — Z96619 Presence of unspecified artificial shoulder joint: Secondary | ICD-10-CM | POA: Diagnosis not present

## 2021-02-01 DIAGNOSIS — M109 Gout, unspecified: Secondary | ICD-10-CM | POA: Diagnosis not present

## 2021-02-01 DIAGNOSIS — T8459XA Infection and inflammatory reaction due to other internal joint prosthesis, initial encounter: Secondary | ICD-10-CM | POA: Diagnosis not present

## 2021-02-01 DIAGNOSIS — M19012 Primary osteoarthritis, left shoulder: Secondary | ICD-10-CM | POA: Diagnosis not present

## 2021-02-01 DIAGNOSIS — Z8616 Personal history of COVID-19: Secondary | ICD-10-CM | POA: Diagnosis not present

## 2021-02-01 DIAGNOSIS — Z96612 Presence of left artificial shoulder joint: Secondary | ICD-10-CM | POA: Diagnosis not present

## 2021-02-01 DIAGNOSIS — E038 Other specified hypothyroidism: Secondary | ICD-10-CM | POA: Insufficient documentation

## 2021-02-01 DIAGNOSIS — G8929 Other chronic pain: Secondary | ICD-10-CM | POA: Insufficient documentation

## 2021-02-01 DIAGNOSIS — K219 Gastro-esophageal reflux disease without esophagitis: Secondary | ICD-10-CM | POA: Insufficient documentation

## 2021-02-01 DIAGNOSIS — M19011 Primary osteoarthritis, right shoulder: Secondary | ICD-10-CM | POA: Diagnosis not present

## 2021-02-02 DIAGNOSIS — Z8739 Personal history of other diseases of the musculoskeletal system and connective tissue: Secondary | ICD-10-CM | POA: Insufficient documentation

## 2021-02-02 DIAGNOSIS — M1990 Unspecified osteoarthritis, unspecified site: Secondary | ICD-10-CM | POA: Insufficient documentation

## 2021-02-02 DIAGNOSIS — T8450XS Infection and inflammatory reaction due to unspecified internal joint prosthesis, sequela: Secondary | ICD-10-CM | POA: Diagnosis not present

## 2021-02-02 DIAGNOSIS — Z01818 Encounter for other preprocedural examination: Secondary | ICD-10-CM | POA: Diagnosis not present

## 2021-02-02 DIAGNOSIS — E559 Vitamin D deficiency, unspecified: Secondary | ICD-10-CM | POA: Diagnosis not present

## 2021-02-02 DIAGNOSIS — J157 Pneumonia due to Mycoplasma pneumoniae: Secondary | ICD-10-CM | POA: Diagnosis not present

## 2021-02-02 DIAGNOSIS — G8929 Other chronic pain: Secondary | ICD-10-CM | POA: Diagnosis not present

## 2021-02-02 DIAGNOSIS — E039 Hypothyroidism, unspecified: Secondary | ICD-10-CM | POA: Diagnosis not present

## 2021-02-02 DIAGNOSIS — M25512 Pain in left shoulder: Secondary | ICD-10-CM | POA: Diagnosis not present

## 2021-02-02 DIAGNOSIS — I10 Essential (primary) hypertension: Secondary | ICD-10-CM | POA: Diagnosis not present

## 2021-02-02 DIAGNOSIS — Z8701 Personal history of pneumonia (recurrent): Secondary | ICD-10-CM | POA: Diagnosis not present

## 2021-02-02 DIAGNOSIS — R9431 Abnormal electrocardiogram [ECG] [EKG]: Secondary | ICD-10-CM | POA: Diagnosis not present

## 2021-02-02 DIAGNOSIS — Z9889 Other specified postprocedural states: Secondary | ICD-10-CM | POA: Diagnosis not present

## 2021-02-02 DIAGNOSIS — Z8616 Personal history of COVID-19: Secondary | ICD-10-CM | POA: Diagnosis not present

## 2021-02-02 DIAGNOSIS — K219 Gastro-esophageal reflux disease without esophagitis: Secondary | ICD-10-CM | POA: Diagnosis not present

## 2021-02-02 DIAGNOSIS — T8459XS Infection and inflammatory reaction due to other internal joint prosthesis, sequela: Secondary | ICD-10-CM | POA: Diagnosis not present

## 2021-02-02 DIAGNOSIS — M25511 Pain in right shoulder: Secondary | ICD-10-CM | POA: Diagnosis not present

## 2021-02-25 DIAGNOSIS — Z8619 Personal history of other infectious and parasitic diseases: Secondary | ICD-10-CM | POA: Diagnosis not present

## 2021-02-25 DIAGNOSIS — Z471 Aftercare following joint replacement surgery: Secondary | ICD-10-CM | POA: Diagnosis not present

## 2021-02-25 DIAGNOSIS — M19012 Primary osteoarthritis, left shoulder: Secondary | ICD-10-CM | POA: Diagnosis not present

## 2021-02-25 DIAGNOSIS — Z8616 Personal history of COVID-19: Secondary | ICD-10-CM | POA: Diagnosis not present

## 2021-02-25 DIAGNOSIS — Z79899 Other long term (current) drug therapy: Secondary | ICD-10-CM | POA: Diagnosis not present

## 2021-02-25 DIAGNOSIS — Z96619 Presence of unspecified artificial shoulder joint: Secondary | ICD-10-CM | POA: Diagnosis not present

## 2021-02-25 DIAGNOSIS — M86642 Other chronic osteomyelitis, left hand: Secondary | ICD-10-CM | POA: Diagnosis not present

## 2021-02-25 DIAGNOSIS — G8918 Other acute postprocedural pain: Secondary | ICD-10-CM | POA: Diagnosis not present

## 2021-02-25 DIAGNOSIS — E039 Hypothyroidism, unspecified: Secondary | ICD-10-CM | POA: Diagnosis not present

## 2021-02-25 DIAGNOSIS — Z96612 Presence of left artificial shoulder joint: Secondary | ICD-10-CM | POA: Diagnosis not present

## 2021-02-25 DIAGNOSIS — Z96643 Presence of artificial hip joint, bilateral: Secondary | ICD-10-CM | POA: Diagnosis not present

## 2021-02-25 DIAGNOSIS — M7989 Other specified soft tissue disorders: Secondary | ICD-10-CM | POA: Diagnosis not present

## 2021-02-25 DIAGNOSIS — M109 Gout, unspecified: Secondary | ICD-10-CM | POA: Diagnosis not present

## 2021-02-25 DIAGNOSIS — I10 Essential (primary) hypertension: Secondary | ICD-10-CM | POA: Diagnosis not present

## 2021-02-25 DIAGNOSIS — Z89232 Acquired absence of left shoulder: Secondary | ICD-10-CM | POA: Diagnosis not present

## 2021-02-25 DIAGNOSIS — K219 Gastro-esophageal reflux disease without esophagitis: Secondary | ICD-10-CM | POA: Diagnosis not present

## 2021-02-25 DIAGNOSIS — M170 Bilateral primary osteoarthritis of knee: Secondary | ICD-10-CM | POA: Diagnosis not present

## 2021-02-25 DIAGNOSIS — Z96611 Presence of right artificial shoulder joint: Secondary | ICD-10-CM | POA: Diagnosis not present

## 2021-02-25 DIAGNOSIS — T8459XA Infection and inflammatory reaction due to other internal joint prosthesis, initial encounter: Secondary | ICD-10-CM | POA: Diagnosis not present

## 2021-02-25 DIAGNOSIS — Z4731 Aftercare following explantation of shoulder joint prosthesis: Secondary | ICD-10-CM | POA: Diagnosis not present

## 2021-03-09 DIAGNOSIS — Z96612 Presence of left artificial shoulder joint: Secondary | ICD-10-CM | POA: Diagnosis not present

## 2021-03-11 DIAGNOSIS — M19012 Primary osteoarthritis, left shoulder: Secondary | ICD-10-CM | POA: Diagnosis not present

## 2021-03-18 DIAGNOSIS — M19012 Primary osteoarthritis, left shoulder: Secondary | ICD-10-CM | POA: Diagnosis not present

## 2021-03-22 DIAGNOSIS — T8459XA Infection and inflammatory reaction due to other internal joint prosthesis, initial encounter: Secondary | ICD-10-CM | POA: Diagnosis not present

## 2021-03-22 DIAGNOSIS — Z96619 Presence of unspecified artificial shoulder joint: Secondary | ICD-10-CM | POA: Diagnosis not present

## 2021-03-22 DIAGNOSIS — M25512 Pain in left shoulder: Secondary | ICD-10-CM | POA: Diagnosis not present

## 2021-03-22 DIAGNOSIS — M19012 Primary osteoarthritis, left shoulder: Secondary | ICD-10-CM | POA: Diagnosis not present

## 2021-03-22 DIAGNOSIS — Z96611 Presence of right artificial shoulder joint: Secondary | ICD-10-CM | POA: Diagnosis not present

## 2021-03-22 DIAGNOSIS — Z8616 Personal history of COVID-19: Secondary | ICD-10-CM | POA: Diagnosis not present

## 2021-03-22 DIAGNOSIS — Z96612 Presence of left artificial shoulder joint: Secondary | ICD-10-CM | POA: Diagnosis not present

## 2021-03-22 DIAGNOSIS — Z96643 Presence of artificial hip joint, bilateral: Secondary | ICD-10-CM | POA: Diagnosis not present

## 2021-03-25 DIAGNOSIS — M19012 Primary osteoarthritis, left shoulder: Secondary | ICD-10-CM | POA: Diagnosis not present

## 2021-03-29 DIAGNOSIS — M19012 Primary osteoarthritis, left shoulder: Secondary | ICD-10-CM | POA: Diagnosis not present

## 2021-04-01 DIAGNOSIS — M19012 Primary osteoarthritis, left shoulder: Secondary | ICD-10-CM | POA: Diagnosis not present

## 2021-04-04 DIAGNOSIS — Z96612 Presence of left artificial shoulder joint: Secondary | ICD-10-CM | POA: Diagnosis not present

## 2021-04-04 DIAGNOSIS — Z471 Aftercare following joint replacement surgery: Secondary | ICD-10-CM | POA: Diagnosis not present

## 2021-04-05 DIAGNOSIS — M19012 Primary osteoarthritis, left shoulder: Secondary | ICD-10-CM | POA: Diagnosis not present

## 2021-04-08 DIAGNOSIS — M19012 Primary osteoarthritis, left shoulder: Secondary | ICD-10-CM | POA: Diagnosis not present

## 2021-04-12 DIAGNOSIS — M19012 Primary osteoarthritis, left shoulder: Secondary | ICD-10-CM | POA: Diagnosis not present

## 2021-04-15 DIAGNOSIS — E291 Testicular hypofunction: Secondary | ICD-10-CM | POA: Diagnosis not present

## 2021-04-15 DIAGNOSIS — M19012 Primary osteoarthritis, left shoulder: Secondary | ICD-10-CM | POA: Diagnosis not present

## 2021-04-15 DIAGNOSIS — Z79890 Hormone replacement therapy (postmenopausal): Secondary | ICD-10-CM | POA: Diagnosis not present

## 2021-04-15 DIAGNOSIS — E039 Hypothyroidism, unspecified: Secondary | ICD-10-CM | POA: Diagnosis not present

## 2021-04-19 DIAGNOSIS — M19012 Primary osteoarthritis, left shoulder: Secondary | ICD-10-CM | POA: Diagnosis not present

## 2021-04-21 DIAGNOSIS — R5383 Other fatigue: Secondary | ICD-10-CM | POA: Diagnosis not present

## 2021-04-21 DIAGNOSIS — Z6829 Body mass index (BMI) 29.0-29.9, adult: Secondary | ICD-10-CM | POA: Diagnosis not present

## 2021-04-21 DIAGNOSIS — E039 Hypothyroidism, unspecified: Secondary | ICD-10-CM | POA: Diagnosis not present

## 2021-04-21 DIAGNOSIS — E291 Testicular hypofunction: Secondary | ICD-10-CM | POA: Diagnosis not present

## 2021-04-21 DIAGNOSIS — M19012 Primary osteoarthritis, left shoulder: Secondary | ICD-10-CM | POA: Diagnosis not present

## 2021-04-26 DIAGNOSIS — M19012 Primary osteoarthritis, left shoulder: Secondary | ICD-10-CM | POA: Diagnosis not present

## 2021-05-03 DIAGNOSIS — M19012 Primary osteoarthritis, left shoulder: Secondary | ICD-10-CM | POA: Diagnosis not present

## 2021-05-05 DIAGNOSIS — M19012 Primary osteoarthritis, left shoulder: Secondary | ICD-10-CM | POA: Diagnosis not present

## 2021-05-06 DIAGNOSIS — Z125 Encounter for screening for malignant neoplasm of prostate: Secondary | ICD-10-CM | POA: Diagnosis not present

## 2021-05-06 DIAGNOSIS — I10 Essential (primary) hypertension: Secondary | ICD-10-CM | POA: Diagnosis not present

## 2021-05-06 DIAGNOSIS — Z136 Encounter for screening for cardiovascular disorders: Secondary | ICD-10-CM | POA: Diagnosis not present

## 2021-05-06 DIAGNOSIS — Z131 Encounter for screening for diabetes mellitus: Secondary | ICD-10-CM | POA: Diagnosis not present

## 2021-05-06 DIAGNOSIS — Z0000 Encounter for general adult medical examination without abnormal findings: Secondary | ICD-10-CM | POA: Diagnosis not present

## 2021-05-10 DIAGNOSIS — M19012 Primary osteoarthritis, left shoulder: Secondary | ICD-10-CM | POA: Diagnosis not present

## 2021-05-13 DIAGNOSIS — M19012 Primary osteoarthritis, left shoulder: Secondary | ICD-10-CM | POA: Diagnosis not present

## 2021-05-16 DIAGNOSIS — M19012 Primary osteoarthritis, left shoulder: Secondary | ICD-10-CM | POA: Diagnosis not present

## 2021-05-18 DIAGNOSIS — M19012 Primary osteoarthritis, left shoulder: Secondary | ICD-10-CM | POA: Diagnosis not present

## 2021-05-23 DIAGNOSIS — M19012 Primary osteoarthritis, left shoulder: Secondary | ICD-10-CM | POA: Diagnosis not present

## 2021-06-06 DIAGNOSIS — T84498D Other mechanical complication of other internal orthopedic devices, implants and grafts, subsequent encounter: Secondary | ICD-10-CM | POA: Diagnosis not present

## 2021-06-06 DIAGNOSIS — Z471 Aftercare following joint replacement surgery: Secondary | ICD-10-CM | POA: Diagnosis not present

## 2021-06-06 DIAGNOSIS — Z96653 Presence of artificial knee joint, bilateral: Secondary | ICD-10-CM | POA: Diagnosis not present

## 2021-06-06 DIAGNOSIS — Z96612 Presence of left artificial shoulder joint: Secondary | ICD-10-CM | POA: Diagnosis not present

## 2021-12-14 ENCOUNTER — Encounter: Payer: Self-pay | Admitting: Infectious Diseases

## 2022-03-21 ENCOUNTER — Encounter: Payer: Self-pay | Admitting: Family Medicine

## 2022-03-21 ENCOUNTER — Ambulatory Visit (INDEPENDENT_AMBULATORY_CARE_PROVIDER_SITE_OTHER): Payer: Medicare Other | Admitting: Family Medicine

## 2022-03-21 VITALS — BP 135/80 | HR 63 | Temp 97.7°F | Ht 71.0 in | Wt 211.6 lb

## 2022-03-21 DIAGNOSIS — M1990 Unspecified osteoarthritis, unspecified site: Secondary | ICD-10-CM | POA: Insufficient documentation

## 2022-03-21 DIAGNOSIS — J302 Other seasonal allergic rhinitis: Secondary | ICD-10-CM

## 2022-03-21 DIAGNOSIS — I10 Essential (primary) hypertension: Secondary | ICD-10-CM | POA: Diagnosis not present

## 2022-03-21 DIAGNOSIS — E039 Hypothyroidism, unspecified: Secondary | ICD-10-CM | POA: Insufficient documentation

## 2022-03-21 DIAGNOSIS — M1000 Idiopathic gout, unspecified site: Secondary | ICD-10-CM | POA: Diagnosis not present

## 2022-03-21 DIAGNOSIS — R7989 Other specified abnormal findings of blood chemistry: Secondary | ICD-10-CM

## 2022-03-21 DIAGNOSIS — M2550 Pain in unspecified joint: Secondary | ICD-10-CM | POA: Insufficient documentation

## 2022-03-21 DIAGNOSIS — H029 Unspecified disorder of eyelid: Secondary | ICD-10-CM

## 2022-03-21 DIAGNOSIS — K219 Gastro-esophageal reflux disease without esophagitis: Secondary | ICD-10-CM | POA: Insufficient documentation

## 2022-03-21 MED ORDER — CELECOXIB 200 MG PO CAPS: 200.0000 mg | ORAL_CAPSULE | Freq: Two times a day (BID) | ORAL | 3 refills | Status: DC

## 2022-03-21 NOTE — Assessment & Plan Note (Signed)
Follows with [facility] endocrinology.  On testosterone replacement

## 2022-03-21 NOTE — Progress Notes (Signed)
Geoffrey West is a 66 y.o. male who presents today for an office visit.  He is a new patient.   Assessment/Plan:  New/Acute Problems: Skin Lesion No red flags.  We will place referral to ophthalmology for further evaluation and possible excision.  Chronic Problems Addressed Today: GERD (gastroesophageal reflux disease) Stable on Protonix 40 mg daily.  Seasonal allergies Stable on Flonase as needed seasonally.  Hypothyroidism Follows with [facility] endocrinology.  On Armour Thyroid 30 mg daily.  Degenerative joint disease s/p multiple joint replacements  Follows with orthopedics.  Will refill Celebrex today.  Gout No recent flares.  On allopurinol 300 mg daily.  Does not need refill today.  Low testosterone Follows with [facility].  On testosterone replacement  Hypertension On amlodipine 10 mg daily.  Blood pressure is at goal today.     Subjective:  HPI:  See A/p for status of chronic conditions.  He has not noticed a hard nodule in left lower eyelid for the last year or so.  Sometimes increases in size and then traced back down.  No pain.  No drainage.  No obvious precipitating events.  ROS: Per HPI, otherwise a complete review of systems was negative.   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   Rash 09/17/2019   Situational depression    "recently lost my mom and brother"  (03/06/2018)   Wears contact lenses    Wears glasses    Patient Active Problem List   Diagnosis Date Noted   Degenerative joint disease s/p multiple joint replacements  03/21/2022   Hypothyroidism 03/21/2022   Seasonal allergies 03/21/2022   GERD (gastroesophageal reflux disease) 03/21/2022   Anxiety 12/09/2019   Hypertension 12/25/2016   Low testosterone 12/25/2016   Gout 12/25/2016   Past Surgical History:  Procedure Laterality Date   BACK SURGERY     COLONOSCOPY     COLONOSCOPY W/ BIOPSIES AND POLYPECTOMY     EXCISIONAL TOTAL SHOULDER ARTHROPLASTY WITH ANTIBIOTIC SPACER Left 03/06/2018   Procedure: Left shoulder antibiotic spacer expalnt with glenoid bone grafting, antiobiotic spacer placement;  Surgeon: [name], MD;  Location: [facility];  Service: Orthopedics;  Laterality: Left;   EXCISIONAL TOTAL SHOULDER ARTHROPLASTY WITH ANTIBIOTIC SPACER Left 01/06/2020   Procedure: LEFT SHOULDER ARTHROPLASTY EXPLANT WITH ANTIBIOTIC SPACER PLACEMENT;  Surgeon: [name], MD;  Location: [place];  Service: Orthopedics;  Laterality: Left;  2.5 hrs RNFA   IR FLUORO GUIDE CV LINE RIGHT  12/26/2016   IRRIGATION AND DEBRIDEMENT SHOULDER Left 05/27/2020   Procedure: Left shoulder antibiotic spacer exchange;  Surgeon: [name], MD;  Location: [place];  Service: Orthopedics;  Laterality: Left;  120 mins   KNEE ARTHROSCOPY Bilateral    X 10 total arthroscopies   KNEE CARTILAGE SURGERY Left 1977   "cartilage removed"   LASIK  Bilateral    LUMBAR DISC SURGERY  2000s   "ruptured disc"   REVERSE SHOULDER ARTHROPLASTY Left 12/08/2016   Procedure: Revision left total shoulder to reverse total shoulder arthroplasty;  Surgeon: [name], MD;  Location: [place];  Service: Orthopedics;  Laterality: Left;   REVERSE SHOULDER ARTHROPLASTY Left 01/01/2018   Procedure: Left reverse shouler explant with antibiotic spacer placement;  Surgeon: [name], MD;  [facility];  Service: Orthopedics;  Laterality: Left;  2.5 hrs   REVERSE SHOULDER ARTHROPLASTY Left 06/11/2018   Procedure: REVERSE TOTAL SHOULDER REVISION;  Surgeon: [name], MD;  Location: [place];  Service: Orthopedics;  Laterality: Left;   REVERSE SHOULDER ARTHROPLASTY Left 12/05/2018   Procedure: Left reverse shoulder explant, bone grafting;  Surgeon: [name], MD;  Location: [place];  Service: Orthopedics;  Laterality: Left;  2.5 hrs   REVISION OF TOTAL SHOULDER Left 12/26/2018   REVISION TOTAL SHOULDER TO REVERSE TOTAL SHOULDER Left 07/16/2018   Procedure: Left shoulder irrigation and debridement with poly and head ball exchange;  Surgeon: [name], MD;  Location: [place];  Service: Orthopedics;  Laterality: Left;  2 hrs   SHOULDER ARTHROSCOPY Right 1980s/1990s   "bone spur removed"   SHOULDER OPEN ROTATOR CUFF REPAIR Right 1970s   SHOULDER SURGERY Left 03/06/2018   antibiotic spacer expalnt with glenoid bone grafting, antiobiotic spacer placement   TOTAL KNEE ARTHROPLASTY Bilateral    TOTAL SHOULDER ARTHROPLASTY Left ~ 2014-2017 X 2   "in [place]"   [place] Right ~ 2009   "in [place]"   [place] Left 12/26/2018   Procedure: Left prosthetic shoulder 1 component revision;  Surgeon: [name], MD;  Location: [place];  Service: Orthopedics;  Laterality: Left;  90 mins   WISDOM TOOTH EXTRACTION      Family History  Problem Relation Age of Onset   Arthritis Mother    Arthritis Sister    Alzheimer's disease Brother    Arthritis Brother     Medications- reviewed and updated Current Outpatient Medications  Medication Sig Dispense Refill   allopurinol (ZYLOPRIM) 300 MG tablet Take 300 mg by mouth daily.     amLODipine (NORVASC) 10 MG tablet Take 10 mg by mouth at bedtime.      ARMOUR THYROID 30 MG tablet Take 30 mg by mouth daily before breakfast.      Coenzyme Q10 (CO Q-10) 300 MG CAPS Take 300 mg by mouth  daily.     diphenhydrAMINE (BENADRYL) 25 MG tablet Take 25 mg by mouth daily as needed for allergies.     diphenhydramine-acetaminophen (TYLENOL PM) 25-500 MG TABS tablet Take 1 tablet by mouth at bedtime as needed (sleep).     fluticasone (FLONASE) 50 MCG/ACT nasal spray Place 2 sprays into both nostrils daily as needed for allergies or rhinitis.     Multiple Vitamin (MULTIVITAMIN WITH MINERALS) TABS tablet Take 1 tablet by mouth daily.     pantoprazole (PROTONIX) 40 MG tablet Take 40 mg by mouth every morning.      propranolol ER (INDERAL LA) 80 MG 24 hr capsule Take 80 mg by mouth every evening.   1   testosterone cypionate (DEPOTESTOSTERONE CYPIONATE) 200 MG/ML injection Inject 140 mg into the muscle every Wednesday.   5   celecoxib (CELEBREX) 200 MG capsule Take 1 capsule (200 mg total) by mouth 2 (two) times daily. 180 capsule 3   No current facility-administered medications for this visit.  Allergies-reviewed and updated Allergies  Allergen Reactions   Fentanyl Shortness Of Breath and Other (See Comments)    Reaction to [facility] ONLY > ? DOSE REGULATION ?    Oxycodone Other (See Comments)    Pt states he goes through hot and cold flashes withdrawal like symptoms    Social History   Socioeconomic History   Marital status: Single    Spouse name: Not on file   Number of children: Not on file   Years of education: Not on file   Highest education level: Not on file  Occupational History   Not on file  Tobacco Use   Smoking status: Never   Smokeless tobacco: Never  Vaping Use   Vaping Use: Never used  Substance and Sexual Activity   Alcohol use: Not Currently   Drug use: Never   Sexual activity: Not Currently  Other Topics Concern   Not on file  Social History Narrative   Not on file   Social Determinants of Health   Financial Resource Strain: Not on file  Food Insecurity: Not on file  Transportation Needs: Not on file  Physical Activity: Not on file  Stress: Not  on file  Social Connections: Not on file           Objective:  Physical Exam: BP 135/80   Pulse 63   Temp 97.7 F (36.5 C) (Temporal)   Ht '5\' 11"'$  (1.803 m)   Wt 211 lb 9.6 oz (96 kg)   SpO2 98%   BMI 29.51 kg/m   Gen: No acute distress, resting comfortably HEENT: Very small 1 to 2 mm nodule on left lower eyelid CV: Regular rate and rhythm with no murmurs appreciated Pulm: Normal work of breathing, clear to auscultation bilaterally with no crackles, wheezes, or rhonchi Neuro: Grossly normal, moves all extremities Psych: Normal affect and thought content      Kaiser Belluomini M. Jerline Pain, MD 03/21/2022 9:46 AM

## 2022-03-21 NOTE — Assessment & Plan Note (Signed)
On amlodipine 10 mg daily.  Blood pressure is at goal today.

## 2022-03-21 NOTE — Patient Instructions (Signed)
It was very nice to see you today!  I will refill your Celebrex.  I will refer you to an eye surgeon to remove the spot on your left lower eyelid.  Please come back in 3 to 6 months for your annual physical.  Come back sooner if needed.  Take care, Dr Jerline Pain  PLEASE NOTE:  If you had any lab tests please let us know if you have not heard back within a few days. You may see your results on mychart before we have a chance to review them but we will give you a call once they are reviewed by [place]. If we ordered any referrals today, please let us know if you have not heard from their office within the next week.   Please try these tips to maintain a healthy lifestyle:  Eat at least 3 REAL meals and 1-2 snacks per day.  Aim for no more than 5 hours between eating.  If you eat breakfast, please do so within one hour of getting up.   Each meal should contain half fruits/vegetables, one quarter protein, and one quarter carbs (no bigger than a computer mouse)  Cut down on sweet beverages. This includes juice, soda, and sweet tea.   Drink at least 1 glass of water with each meal and aim for at least 8 glasses per day  Exercise at least 150 minutes every week.

## 2022-03-21 NOTE — Assessment & Plan Note (Signed)
Stable on Flonase as needed seasonally.

## 2022-03-21 NOTE — Assessment & Plan Note (Signed)
Follows with orthopedics.  Will refill Celebrex today.

## 2022-03-21 NOTE — Assessment & Plan Note (Signed)
No recent flares.  On allopurinol 300 mg daily.  Does not need refill today.

## 2022-03-21 NOTE — Assessment & Plan Note (Signed)
Stable on Protonix 40 mg daily. 

## 2022-03-21 NOTE — Assessment & Plan Note (Signed)
Follows with [facility] endocrinology.  On Armour Thyroid 30 mg daily.

## 2022-04-18 DIAGNOSIS — E039 Hypothyroidism, unspecified: Secondary | ICD-10-CM | POA: Diagnosis not present

## 2022-04-18 DIAGNOSIS — E291 Testicular hypofunction: Secondary | ICD-10-CM | POA: Diagnosis not present

## 2022-04-20 DIAGNOSIS — E291 Testicular hypofunction: Secondary | ICD-10-CM | POA: Diagnosis not present

## 2022-04-20 DIAGNOSIS — Z6829 Body mass index (BMI) 29.0-29.9, adult: Secondary | ICD-10-CM | POA: Diagnosis not present

## 2022-04-20 DIAGNOSIS — M2550 Pain in unspecified joint: Secondary | ICD-10-CM | POA: Diagnosis not present

## 2022-04-20 DIAGNOSIS — G479 Sleep disorder, unspecified: Secondary | ICD-10-CM | POA: Diagnosis not present

## 2022-05-12 ENCOUNTER — Other Ambulatory Visit: Payer: Self-pay | Admitting: *Deleted

## 2022-05-12 MED ORDER — FLUTICASONE PROPIONATE 50 MCG/ACT NA SUSP: 2.0000 | Freq: Every day | NASAL | 1 refills | Status: DC | PRN

## 2022-05-15 ENCOUNTER — Ambulatory Visit: Payer: PPO | Admitting: Family Medicine

## 2022-06-01 ENCOUNTER — Other Ambulatory Visit: Payer: Self-pay | Admitting: *Deleted

## 2022-06-01 NOTE — Telephone Encounter (Signed)
Last refill by historical provider

## 2022-06-02 MED ORDER — ALLOPURINOL 300 MG PO TABS: 300.0000 mg | ORAL_TABLET | Freq: Every day | ORAL | 3 refills | Status: DC

## 2022-06-02 MED ORDER — AMLODIPINE BESYLATE 10 MG PO TABS: 10.0000 mg | ORAL_TABLET | Freq: Every day | ORAL | 3 refills | Status: DC

## 2022-06-02 MED ORDER — PROPRANOLOL HCL ER 80 MG PO CP24: 80.0000 mg | ORAL_CAPSULE | Freq: Every evening | ORAL | 3 refills | Status: DC

## 2022-07-21 DIAGNOSIS — E291 Testicular hypofunction: Secondary | ICD-10-CM | POA: Diagnosis not present

## 2022-07-21 DIAGNOSIS — E039 Hypothyroidism, unspecified: Secondary | ICD-10-CM | POA: Diagnosis not present

## 2022-07-21 DIAGNOSIS — R5383 Other fatigue: Secondary | ICD-10-CM | POA: Diagnosis not present

## 2022-07-24 DIAGNOSIS — R5383 Other fatigue: Secondary | ICD-10-CM | POA: Diagnosis not present

## 2022-07-24 DIAGNOSIS — E291 Testicular hypofunction: Secondary | ICD-10-CM | POA: Diagnosis not present

## 2022-07-24 DIAGNOSIS — Z6830 Body mass index (BMI) 30.0-30.9, adult: Secondary | ICD-10-CM | POA: Diagnosis not present

## 2022-07-24 DIAGNOSIS — M2550 Pain in unspecified joint: Secondary | ICD-10-CM | POA: Diagnosis not present

## 2022-08-18 DIAGNOSIS — L821 Other seborrheic keratosis: Secondary | ICD-10-CM | POA: Diagnosis not present

## 2022-08-18 DIAGNOSIS — D0359 Melanoma in situ of other part of trunk: Secondary | ICD-10-CM | POA: Diagnosis not present

## 2022-08-18 DIAGNOSIS — L570 Actinic keratosis: Secondary | ICD-10-CM | POA: Diagnosis not present

## 2022-08-18 DIAGNOSIS — D485 Neoplasm of uncertain behavior of skin: Secondary | ICD-10-CM | POA: Diagnosis not present

## 2022-08-18 DIAGNOSIS — L723 Sebaceous cyst: Secondary | ICD-10-CM | POA: Diagnosis not present

## 2022-08-18 DIAGNOSIS — D2262 Melanocytic nevi of left upper limb, including shoulder: Secondary | ICD-10-CM | POA: Diagnosis not present

## 2022-08-18 DIAGNOSIS — D225 Melanocytic nevi of trunk: Secondary | ICD-10-CM | POA: Diagnosis not present

## 2022-09-04 ENCOUNTER — Ambulatory Visit (INDEPENDENT_AMBULATORY_CARE_PROVIDER_SITE_OTHER): Payer: Medicare Other | Admitting: Family Medicine

## 2022-09-04 ENCOUNTER — Encounter: Payer: Self-pay | Admitting: Family Medicine

## 2022-09-04 ENCOUNTER — Telehealth: Payer: Self-pay

## 2022-09-04 VITALS — BP 143/77 | HR 58 | Temp 98.0°F | Ht 71.0 in | Wt 212.8 lb

## 2022-09-04 DIAGNOSIS — Z0001 Encounter for general adult medical examination with abnormal findings: Secondary | ICD-10-CM

## 2022-09-04 DIAGNOSIS — R7989 Other specified abnormal findings of blood chemistry: Secondary | ICD-10-CM

## 2022-09-04 DIAGNOSIS — Z131 Encounter for screening for diabetes mellitus: Secondary | ICD-10-CM | POA: Diagnosis not present

## 2022-09-04 DIAGNOSIS — I10 Essential (primary) hypertension: Secondary | ICD-10-CM | POA: Diagnosis not present

## 2022-09-04 DIAGNOSIS — Z13220 Encounter for screening for lipoid disorders: Secondary | ICD-10-CM | POA: Diagnosis not present

## 2022-09-04 DIAGNOSIS — D039 Melanoma in situ, unspecified: Secondary | ICD-10-CM | POA: Insufficient documentation

## 2022-09-04 DIAGNOSIS — E039 Hypothyroidism, unspecified: Secondary | ICD-10-CM

## 2022-09-04 DIAGNOSIS — M1000 Idiopathic gout, unspecified site: Secondary | ICD-10-CM

## 2022-09-04 DIAGNOSIS — J302 Other seasonal allergic rhinitis: Secondary | ICD-10-CM

## 2022-09-04 DIAGNOSIS — K219 Gastro-esophageal reflux disease without esophagitis: Secondary | ICD-10-CM

## 2022-09-04 LAB — URIC ACID: Uric Acid, Serum: 4.9 mg/dL (ref 4.0–7.8)

## 2022-09-04 LAB — COMPREHENSIVE METABOLIC PANEL
ALT: 22 U/L (ref 0–53)
AST: 21 U/L (ref 0–37)
Albumin: 4.2 g/dL (ref 3.5–5.2)
Alkaline Phosphatase: 73 U/L (ref 39–117)
BUN: 21 mg/dL (ref 6–23)
CO2: 30 mEq/L (ref 19–32)
Calcium: 10.4 mg/dL (ref 8.4–10.5)
Chloride: 101 mEq/L (ref 96–112)
Creatinine, Ser: 1.43 mg/dL (ref 0.40–1.50)
GFR: 51.23 mL/min — ABNORMAL LOW (ref >60.00)
Glucose, Bld: 99 mg/dL (ref 70–99)
Potassium: 4 mEq/L (ref 3.5–5.1)
Sodium: 139 mEq/L (ref 135–145)
Total Bilirubin: 1.2 mg/dL (ref 0.2–1.2)
Total Protein: 7.1 g/dL (ref 6.0–8.3)

## 2022-09-04 LAB — CBC
HCT: 54.8 % — ABNORMAL HIGH (ref 39.0–52.0)
Hemoglobin: 19 g/dL (ref 13.0–17.0)
MCHC: 34.6 g/dL (ref 30.0–36.0)
MCV: 97.7 fl (ref 78.0–100.0)
Platelets: 160 10*3/uL (ref 150.0–400.0)
RBC: 5.61 Mil/uL (ref 4.22–5.81)
RDW: 14.1 % (ref 11.5–15.5)
WBC: 6.1 10*3/uL (ref 4.0–10.5)

## 2022-09-04 LAB — LIPID PANEL
Cholesterol: 190 mg/dL (ref 0–200)
HDL: 36.8 mg/dL — ABNORMAL LOW (ref >39.00)
NonHDL: 153.21
Total CHOL/HDL Ratio: 5
Triglycerides: 208 mg/dL — ABNORMAL HIGH (ref 0.0–149.0)
VLDL: 41.6 mg/dL — ABNORMAL HIGH (ref 0.0–40.0)

## 2022-09-04 LAB — LDL CHOLESTEROL, DIRECT: Direct LDL: 125 mg/dL

## 2022-09-04 LAB — PSA: PSA: 3.08 ng/mL (ref 0.10–4.00)

## 2022-09-04 LAB — HEMOGLOBIN A1C: Hgb A1c MFr Bld: 5.5 % (ref 4.6–6.5)

## 2022-09-04 NOTE — Assessment & Plan Note (Addendum)
On Armour Thyroid 30 mg daily.  Continue management per [facility] endocrinology.

## 2022-09-04 NOTE — Assessment & Plan Note (Signed)
Stable on Protonix 40 mg daily. 

## 2022-09-04 NOTE — Assessment & Plan Note (Signed)
Stable.  No recent flares.  Continue allopurinol 300 mg daily.  Check uric acid level today.

## 2022-09-04 NOTE — Assessment & Plan Note (Signed)
Continue management for [facility] endocrinology.

## 2022-09-04 NOTE — Assessment & Plan Note (Signed)
Mildly elevated today though typically well-controlled.  We will continue amlodipine 10 mg daily and he will let us know if persistently elevated.

## 2022-09-04 NOTE — Assessment & Plan Note (Signed)
Recently found by dermatology.  He has follow-up with them soon.

## 2022-09-04 NOTE — Patient Instructions (Signed)
It was very nice to see you today!  Will check blood work today.  Please keep an eye on your blood pressure and let us know if it is persistently elevated.  We will see you back in a year for your next physical.  Come back sooner if needed.  Take care, Dr Jerline Pain  PLEASE NOTE:  If you had any lab tests, please let us know if you have not heard back within a few days. You may see your results on mychart before we have a chance to review them but we will give you a call once they are reviewed by [place].   If we ordered any referrals today, please let us know if you have not heard from their office within the next week.   If you had any urgent prescriptions sent in today, please check with the pharmacy within an hour of our visit to make sure the prescription was transmitted appropriately.   Please try these tips to maintain a healthy lifestyle:  Eat at least 3 REAL meals and 1-2 snacks per day.  Aim for no more than 5 hours between eating.  If you eat breakfast, please do so within one hour of getting up.   Each meal should contain half fruits/vegetables, one quarter protein, and one quarter carbs (no bigger than a computer mouse)  Cut down on sweet beverages. This includes juice, soda, and sweet tea.   Drink at least 1 glass of water with each meal and aim for at least 8 glasses per day  Exercise at least 150 minutes every week.    Preventive Care 59 Years and Older, Male Preventive care refers to lifestyle choices and visits with your health care provider that can promote health and wellness. Preventive care visits are also called wellness exams. What can I expect for my preventive care visit? Counseling During your preventive care visit, your health care provider may ask about your: Medical history, including: Past medical problems. Family medical history. History of falls. Current health, including: Emotional well-being. Home life and relationship well-being. Sexual  activity. Memory and ability to understand (cognition). Lifestyle, including: Alcohol, nicotine or tobacco, and drug use. Access to firearms. Diet, exercise, and sleep habits. Work and work Statistician. Sunscreen use. Safety issues such as seatbelt and bike helmet use. Physical exam Your health care provider will check your: Height and weight. These may be used to calculate your BMI (body mass index). BMI is a measurement that tells if you are at a healthy weight. Waist circumference. This measures the distance around your waistline. This measurement also tells if you are at a healthy weight and may help predict your risk of certain diseases, such as type 2 diabetes and high blood pressure. Heart rate and blood pressure. Body temperature. Skin for abnormal spots. What immunizations do I need?  Vaccines are usually given at various ages, according to a schedule. Your health care provider will recommend vaccines for you based on your age, medical history, and lifestyle or other factors, such as travel or where you work. What tests do I need? Screening Your health care provider may recommend screening tests for certain conditions. This may include: Lipid and cholesterol levels. Diabetes screening. This is done by checking your blood sugar (glucose) after you have not eaten for a while (fasting). Hepatitis C test. Hepatitis B test. HIV (human immunodeficiency virus) test. STI (sexually transmitted infection) testing, if you are at risk. Lung cancer screening. Colorectal cancer screening. Prostate cancer screening. Abdominal aortic  aneurysm (AAA) screening. You may need this if you are a current or former smoker. Talk with your health care provider about your test results, treatment options, and if necessary, the need for more tests. Follow these instructions at home: Eating and drinking  Eat a diet that includes fresh fruits and vegetables, whole grains, lean protein, and low-fat  dairy products. Limit your intake of foods with high amounts of sugar, saturated fats, and salt. Take vitamin and mineral supplements as recommended by your health care provider. Do not drink alcohol if your health care provider tells you not to drink. If you drink alcohol: Limit how much you have to 0-2 drinks a day. Know how much alcohol is in your drink. In the [place] one drink equals one 12 oz bottle of beer (355 mL), one 5 oz glass of wine (148 mL), or one 1 oz glass of hard liquor (44 mL). Lifestyle Brush your teeth every morning and night with fluoride toothpaste. Floss one time each day. Exercise for at least 30 minutes 5 or more days each week. Do not use any products that contain nicotine or tobacco. These products include cigarettes, chewing tobacco, and vaping devices, such as e-cigarettes. If you need help quitting, ask your health care provider. Do not use drugs. If you are sexually active, practice safe sex. Use a condom or other form of protection to prevent STIs. Take aspirin only as told by your health care provider. Make sure that you understand how much to take and what form to take. Work with your health care provider to find out whether it is safe and beneficial for you to take aspirin daily. Ask your health care provider if you need to take a cholesterol-lowering medicine (statin). Find healthy ways to manage stress, such as: Meditation, yoga, or listening to music. Journaling. Talking to a trusted person. Spending time with friends and family. Safety Always wear your seat belt while driving or riding in a vehicle. Do not drive: If you have been drinking alcohol. Do not ride with someone who has been drinking. When you are tired or distracted. While texting. If you have been using any mind-altering substances or drugs. Wear a helmet and other protective equipment during sports activities. If you have firearms in your house, make sure you follow all gun safety  procedures. Minimize exposure to UV radiation to reduce your risk of skin cancer. What's next? Visit your health care provider once a year for an annual wellness visit. Ask your health care provider how often you should have your eyes and teeth checked. Stay up to date on all vaccines. This information is not intended to replace advice given to you by your health care provider. Make sure you discuss any questions you have with your health care provider. Document Revised: 12/08/2020 Document Reviewed: 12/08/2020 Elsevier Patient Education  [place].

## 2022-09-04 NOTE — Telephone Encounter (Signed)
Santiago Glad from [employer] called with critical for pt, with Hemoglobin being 19.0

## 2022-09-04 NOTE — Progress Notes (Signed)
Chief Complaint:  Geoffrey West is a 67 y.o. male who presents today for his annual comprehensive physical exam.    Assessment/Plan:  Chronic Problems Addressed Today: Melanoma in situ [facility]) Recently found by dermatology.  He has follow-up with them soon.  Hypothyroidism On Armour Thyroid 30 mg daily.  Continue management per [facility] endocrinology.  Low testosterone Continue management for [facility].  GERD (gastroesophageal reflux disease) Stable on Protonix 40 mg daily.  Seasonal allergies Stable on Flonase as needed seasonally.  Gout Stable.  No recent flares.  Continue allopurinol 300 mg daily.  Check uric acid level today.  Hypertension Mildly elevated today though typically well-controlled.  We will continue amlodipine 10 mg daily and he will let us know if persistently elevated.  Preventative Healthcare: Check labs.  He will get Tdap at the pharmacy.  Patient Counseling(The following topics were reviewed and/or handout was given):  -Nutrition: Stressed importance of moderation in sodium/caffeine intake, saturated fat and cholesterol, caloric balance, sufficient intake of fresh fruits, vegetables, and fiber.  -Stressed the importance of regular exercise.   -Substance Abuse: Discussed cessation/primary prevention of tobacco, alcohol, or other drug use; driving or other dangerous activities under the influence; availability of treatment for abuse.   -Injury prevention: Discussed safety belts, safety helmets, smoke detector, smoking near bedding or upholstery.   -Sexuality: Discussed sexually transmitted diseases, partner selection, use of condoms, avoidance of unintended pregnancy and contraceptive alternatives.   -Dental health: Discussed importance of regular tooth brushing, flossing, and dental visits.  -Health maintenance and immunizations reviewed. Please refer to Health maintenance section.  Return to care in 1 year for next preventative visit.      Subjective:  HPI:  He has no acute complaints today.   Since our last visit he has seen dermatology.  Had biopsy performed.  This showed melanoma in situ.  Has upcoming appointment with them again in a few weeks.  Lifestyle Diet: Balanced. Trying to get plenty of fruits vegetables.  Exercise: Weight lifting and cardio. Playing golf.      09/04/2022   12:57 PM  Depression screen PHQ 2/9  Decreased Interest 0  Down, Depressed, Hopeless 0  PHQ - 2 Score 0    Health Maintenance Due  Topic Date Due   Medicare Annual Wellness (AWV)  Never done   DTaP/Tdap/Td (2 - Td or Tdap) 05/18/2022     ROS: Per HPI, otherwise a complete review of systems was negative.   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   Rash 09/17/2019   Situational depression    "recently lost my mom and brother" (03/06/2018)   Wears contact lenses    Wears glasses    Patient Active Problem List   Diagnosis Date Noted   Melanoma in situ ([place]) 09/04/2022   Degenerative joint disease s/p multiple joint  replacements  03/21/2022   Hypothyroidism 03/21/2022   Seasonal allergies 03/21/2022   GERD (gastroesophageal reflux disease) 03/21/2022   Anxiety 12/09/2019   Hypertension 12/25/2016   Low testosterone 12/25/2016   Gout 12/25/2016   Past Surgical History:  Procedure Laterality Date   BACK SURGERY     COLONOSCOPY     COLONOSCOPY W/  BIOPSIES AND POLYPECTOMY     EXCISIONAL TOTAL SHOULDER ARTHROPLASTY WITH ANTIBIOTIC SPACER Left 03/06/2018   Procedure: Left shoulder antibiotic spacer expalnt with glenoid bone grafting, antiobiotic spacer placement;  Surgeon: [name], MD;  Location: [place];  Service: Orthopedics;  Laterality: Left;   EXCISIONAL TOTAL SHOULDER ARTHROPLASTY WITH ANTIBIOTIC SPACER Left 01/06/2020   Procedure: LEFT SHOULDER ARTHROPLASTY EXPLANT WITH ANTIBIOTIC SPACER PLACEMENT;  Surgeon: [name], MD;  Location: [place];  Service: Orthopedics;  Laterality: Left;  2.5 hrs RNFA   IR FLUORO GUIDE CV LINE RIGHT  12/26/2016   IRRIGATION AND DEBRIDEMENT SHOULDER Left 05/27/2020   Procedure: Left shoulder antibiotic spacer exchange;  Surgeon: [name], MD;  Location: [place];  Service: Orthopedics;  Laterality: Left;  120 mins   KNEE ARTHROSCOPY Bilateral    X 10 total arthroscopies   KNEE CARTILAGE SURGERY Left 1977   "cartilage removed"   LASIK Bilateral    LUMBAR DISC SURGERY  2000s   "ruptured disc"   REVERSE SHOULDER ARTHROPLASTY Left 12/08/2016   Procedure: Revision left total shoulder to reverse total shoulder arthroplasty;  Surgeon: [name], MD;  Location: [place];  Service: Orthopedics;  Laterality: Left;   REVERSE SHOULDER ARTHROPLASTY Left 01/01/2018   Procedure: Left reverse shouler explant with antibiotic spacer placement;  Surgeon: [name], MD;  Location: [facility];  Service: Orthopedics;  Laterality: Left;  2.5 hrs   REVERSE SHOULDER ARTHROPLASTY Left 06/11/2018   Procedure: REVERSE TOTAL SHOULDER REVISION;  Surgeon: [name], MD;  Location: [place];  Service: Orthopedics;  Laterality: Left;   REVERSE SHOULDER ARTHROPLASTY Left 12/05/2018   Procedure: Left reverse shoulder explant, bone grafting;  Surgeon: [name], MD;  Location: [place];  Service: Orthopedics;  Laterality: Left;  2.5 hrs   REVISION OF TOTAL SHOULDER Left 12/26/2018    REVISION TOTAL SHOULDER TO REVERSE TOTAL SHOULDER Left 07/16/2018   Procedure: Left shoulder irrigation and debridement with poly and head ball exchange;  Surgeon: [name], MD;  Location: [place];  Service: Orthopedics;  Laterality: Left;  2 hrs   SHOULDER ARTHROSCOPY Right 1980s/1990s   "bone spur removed"   SHOULDER OPEN ROTATOR CUFF REPAIR Right 1970s   SHOULDER SURGERY Left 03/06/2018   antibiotic spacer expalnt with glenoid bone grafting, antiobiotic spacer placement   TOTAL KNEE ARTHROPLASTY Bilateral    TOTAL SHOULDER ARTHROPLASTY Left ~ 2014-2017 X 2   "in [place]"   [place] Right ~ 2009   "in [place]"   [place] Left 12/26/2018   Procedure: Left prosthetic shoulder 1 component revision;  Surgeon: [name], MD;  Location: [place];  Service: Orthopedics;  Laterality: Left;  90 mins   WISDOM TOOTH EXTRACTION      Family History  Problem Relation Age of Onset   Arthritis Mother    Arthritis Sister    Alzheimer's disease Brother    Arthritis Brother     Medications- reviewed and updated Current Outpatient Medications  Medication Sig Dispense Refill   allopurinol (ZYLOPRIM) 300 MG tablet Take 1 tablet (300 mg total) by mouth daily.  90 tablet 3   amLODipine (NORVASC) 10 MG tablet Take 1 tablet (10 mg total) by mouth at bedtime. 90 tablet 3   ARMOUR THYROID 30 MG tablet Take 30 mg by mouth daily before breakfast.      celecoxib (CELEBREX) 200 MG capsule Take 1 capsule (200 mg total) by mouth 2 (two) times daily. 180 capsule 3   Coenzyme Q10 (CO Q-10) 300 MG CAPS Take 300 mg by mouth daily.     diphenhydrAMINE (BENADRYL) 25 MG tablet Take 25 mg by mouth daily as needed for allergies.     diphenhydramine-acetaminophen (TYLENOL PM) 25-500 MG TABS tablet Take 1 tablet by mouth at bedtime as needed (sleep).     fluticasone (FLONASE) 50 MCG/ACT nasal spray Place 2 sprays into both nostrils daily as needed for allergies or  rhinitis. 11.1 mL 1   Multiple Vitamin (MULTIVITAMIN WITH MINERALS) TABS tablet Take 1 tablet by mouth daily.     pantoprazole (PROTONIX) 40 MG tablet Take 40 mg by mouth every morning.      propranolol ER (INDERAL LA) 80 MG 24 hr capsule Take 1 capsule (80 mg total) by mouth every evening. 90 capsule 3   testosterone cypionate (DEPOTESTOSTERONE CYPIONATE) 200 MG/ML injection Inject 140 mg into the muscle every Wednesday.   5   No current facility-administered medications for this visit.    Allergies-reviewed and updated Allergies  Allergen Reactions   Fentanyl Shortness Of Breath and Other (See Comments)    Reaction to [facility] ONLY > ? DOSE REGULATION ?    Oxycodone Other (See Comments)    Pt states he goes through hot and cold flashes withdrawal like symptoms    Social History   Socioeconomic History   Marital status: Single    Spouse name: Not on file   Number of children: Not on file   Years of education: Not on file   Highest education level: Not on file  Occupational History   Not on file  Tobacco Use   Smoking status: Never   Smokeless tobacco: Never  Vaping Use   Vaping Use: Never used  Substance and Sexual Activity   Alcohol use: Not Currently   Drug use: Never   Sexual activity: Not Currently  Other Topics Concern   Not on file  Social History Narrative   Not on file   Social Determinants of Health   Financial Resource Strain: Not on file  Food Insecurity: Not on file  Transportation Needs: Not on file  Physical Activity: Not on file  Stress: Not on file  Social Connections: Not on file        Objective:  Physical Exam: BP (!) 143/77   Pulse (!) 58   Temp 98 F (36.7 C) (Temporal)   Ht '5\' 11"'$  (1.803 m)   Wt 212 lb 12.8 oz (96.5 kg)   SpO2 99%   BMI 29.68 kg/m   Body mass index is 29.68 kg/m. Wt Readings from Last 3 Encounters:  09/04/22 212 lb 12.8 oz (96.5 kg)  03/21/22 211 lb 9.6 oz (96 kg)  05/27/20 210 lb (95.3 kg)   Gen: NAD,  resting comfortably HEENT: TMs normal bilaterally. OP clear. No thyromegaly noted.  CV: RRR with no murmurs appreciated Pulm: NWOB, CTAB with no crackles, wheezes, or rhonchi GI: Normal bowel sounds present. Soft, Nontender, Nondistended. MSK: no edema, cyanosis, or clubbing noted Skin: warm, dry Neuro: CN2-12 grossly intact. Strength 5/5 in upper and lower extremities. Reflexes symmetric and intact bilaterally.  Psych: Normal affect and thought content     Venita Seng M. Jerline Pain, MD 09/04/2022 2:05 PM

## 2022-09-04 NOTE — Assessment & Plan Note (Signed)
Stable on Flonase as needed seasonally. 

## 2022-09-05 ENCOUNTER — Other Ambulatory Visit: Payer: Self-pay

## 2022-09-05 ENCOUNTER — Encounter: Payer: Self-pay | Admitting: Family Medicine

## 2022-09-05 DIAGNOSIS — E785 Hyperlipidemia, unspecified: Secondary | ICD-10-CM | POA: Insufficient documentation

## 2022-09-05 MED ORDER — ATORVASTATIN CALCIUM 40 MG PO TABS: 40.0000 mg | ORAL_TABLET | Freq: Every day | ORAL | 3 refills | Status: DC

## 2022-09-05 NOTE — Telephone Encounter (Signed)
Mychart message sent to patient with PCP comments

## 2022-09-05 NOTE — Progress Notes (Signed)
Please inform patient of the following:  His red blood cell counts are elevated.  This is most likely due to his testosterone supplementation.  He can discuss with his endocrinologist about decreasing the dose of his testosterone to help lower his blood counts.  It would also be reasonable for him to donate blood or have phlebotomy done periodically to lower these numbers.  It is important that we lower these numbers because at higher levels they do increased risk for blood clots he should follow-up with the endocrinologist to discuss.  His cholesterol level was elevated.  It would be reasonable to start a cholesterol medication to improve his numbers and lower risk of heart attack and stroke.  Please send in a prescription for Lipitor 40 mg daily if he is agreeable to start.  Regardless, he should continue to work on diet and exercise and we can recheck this in a year or so.  All his other labs are normal and we can recheck in a year.

## 2022-09-05 NOTE — Telephone Encounter (Signed)
Please see result note.  Geoffrey West. Jerline Pain, MD 09/05/2022 1:11 PM

## 2022-09-20 DIAGNOSIS — D0359 Melanoma in situ of other part of trunk: Secondary | ICD-10-CM | POA: Diagnosis not present

## 2022-09-26 ENCOUNTER — Telehealth: Payer: Self-pay | Admitting: Family Medicine

## 2022-09-26 ENCOUNTER — Other Ambulatory Visit: Payer: Self-pay

## 2022-09-26 MED ORDER — PANTOPRAZOLE SODIUM 40 MG PO TBEC: 40.0000 mg | DELAYED_RELEASE_TABLET | ORAL | 3 refills | Status: DC

## 2022-09-26 NOTE — Telephone Encounter (Signed)
Rx sent 

## 2022-09-26 NOTE — Telephone Encounter (Signed)
Ok with me. Please place any necessary orders. 

## 2022-09-26 NOTE — Telephone Encounter (Signed)
  Encourage patient to contact the pharmacy for refills or they can request refills through [employer]:  Please schedule appointment if longer than 1 year  NEXT APPOINTMENT DATE:  MEDICATION:  pantoprazole (PROTONIX) 40 MG tablet   Is the patient out of medication? Almost  PHARMACY:  [id number] - [place], [address] Phone: [phone]  Fax: [phone]      Let patient know to contact pharmacy at the end of the day to make sure medication is ready.  Please notify patient to allow 48-72 hours to process

## 2022-09-27 ENCOUNTER — Telehealth: Payer: Self-pay | Admitting: Family Medicine

## 2022-09-27 NOTE — Telephone Encounter (Signed)
Contacted Geoffrey West to schedule their annual wellness visit. Appointment made for 10/03/2022.  [facility] Dial [phone]

## 2022-10-03 ENCOUNTER — Ambulatory Visit (INDEPENDENT_AMBULATORY_CARE_PROVIDER_SITE_OTHER): Payer: BC Managed Care – PPO

## 2022-10-03 VITALS — Wt 210.0 lb

## 2022-10-03 DIAGNOSIS — Z0000 Encounter for general adult medical examination without abnormal findings: Secondary | ICD-10-CM | POA: Diagnosis not present

## 2022-10-03 NOTE — Progress Notes (Signed)
I connected with  Geoffrey West on 10/03/22 by a audio enabled telemedicine application and verified that I am speaking with the correct person using two identifiers.  Patient Location: Home  Provider Location: Office/Clinic  I discussed the limitations of evaluation and management by telemedicine. The patient expressed understanding and agreed to proceed.     Patient Medicare AWV questionnaire was completed by the patient on 10/02/22; I have confirmed that all information answered by patient is correct and no changes since this date.      Subjective:   Geoffrey West is a 67 y.o. male who presents for an Initial Medicare Annual Wellness Visit.  Review of Systems     Cardiac Risk Factors include: advanced age (>22men, >56 women)     Objective:    Today's Vitals   10/03/22 0835  Weight: 210 lb (95.3 kg)   Body mass index is 29.29 kg/m.     10/03/2022    8:40 AM 05/19/2020    9:56 AM 01/06/2020    6:00 PM 01/01/2020   10:24 AM 12/26/2018    1:36 PM 12/26/2018    5:56 AM 12/05/2018    1:00 PM  Advanced Directives  Does Patient Have a Medical Advance Directive? Yes No No No No No No  Type of Estate agent of [place];Living will        Copy of Healthcare Power of Attorney in Chart? No - copy requested        Would patient like information on creating a medical advance directive?  No - Patient declined No - Patient declined Yes (MAU/Ambulatory/Procedural Areas - Information given) No - Patient declined No - Patient declined Yes (MAU/Ambulatory/Procedural Areas - Information given)    Current Medications (verified) Outpatient Encounter Medications as of 10/03/2022  Medication Sig   allopurinol (ZYLOPRIM) 300 MG tablet Take 1 tablet (300 mg total) by mouth daily.   amLODipine (NORVASC) 10 MG tablet Take 1 tablet (10 mg total) by mouth at bedtime.   ARMOUR THYROID 30 MG tablet Take 30 mg by mouth daily before breakfast.    celecoxib (CELEBREX) 200 MG capsule  Take 1 capsule (200 mg total) by mouth 2 (two) times daily.   Coenzyme Q10 (CO Q-10) 300 MG CAPS Take 300 mg by mouth daily.   diphenhydrAMINE (BENADRYL) 25 MG tablet Take 25 mg by mouth daily as needed for allergies.   diphenhydramine-acetaminophen (TYLENOL PM) 25-500 MG TABS tablet Take 1 tablet by mouth at bedtime as needed (sleep).   fluticasone (FLONASE) 50 MCG/ACT nasal spray Place 2 sprays into both nostrils daily as needed for allergies or rhinitis.   Multiple Vitamin (MULTIVITAMIN WITH MINERALS) TABS tablet Take 1 tablet by mouth daily.   pantoprazole (PROTONIX) 40 MG tablet Take 1 tablet (40 mg total) by mouth every morning.   propranolol ER (INDERAL LA) 80 MG 24 hr capsule Take 1 capsule (80 mg total) by mouth every evening.   testosterone cypionate (DEPOTESTOSTERONE CYPIONATE) 200 MG/ML injection Inject 140 mg into the muscle every Wednesday.    [DISCONTINUED] atorvastatin (LIPITOR) 40 MG tablet Take 1 tablet (40 mg total) by mouth daily.   No facility-administered encounter medications on file as of 10/03/2022.    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   Rash 09/17/2019   Situational depression    "recently lost my mom and brother" (03/06/2018)   Wears contact lenses    Wears glasses    Past Surgical History:  Procedure Laterality Date   BACK SURGERY     COLONOSCOPY     COLONOSCOPY W/ BIOPSIES AND POLYPECTOMY      EXCISIONAL TOTAL SHOULDER ARTHROPLASTY WITH ANTIBIOTIC SPACER Left 03/06/2018   Procedure: Left shoulder antibiotic spacer expalnt with glenoid bone grafting, antiobiotic spacer placement;  Surgeon: Yolonda Kida, MD;  Location: [facility] OR;  Service: Orthopedics;  Laterality: Left;   EXCISIONAL TOTAL SHOULDER ARTHROPLASTY WITH ANTIBIOTIC SPACER Left 01/06/2020   Procedure: LEFT SHOULDER ARTHROPLASTY EXPLANT WITH ANTIBIOTIC SPACER PLACEMENT;  Surgeon: Yolonda Kida, MD;  Location: [facility] OR;  Service: Orthopedics;  Laterality: Left;  2.5 hrs RNFA   IR FLUORO GUIDE CV LINE RIGHT  12/26/2016   IRRIGATION AND DEBRIDEMENT SHOULDER Left 05/27/2020   Procedure: Left shoulder antibiotic spacer exchange;  Surgeon: Yolonda Kida, MD;  Location: [facility] OR;  Service: Orthopedics;  Laterality: Left;  120 mins   KNEE ARTHROSCOPY Bilateral    X 10 total arthroscopies   KNEE CARTILAGE SURGERY Left 1977   "cartilage removed"   LASIK Bilateral    LUMBAR DISC SURGERY  2000s   "ruptured disc"   REVERSE SHOULDER ARTHROPLASTY Left 12/08/2016   Procedure: Revision left total shoulder to reverse total shoulder arthroplasty;  Surgeon: Yolonda Kida, MD;  Location: [facility] OR;  Service: Orthopedics;  Laterality: Left;   REVERSE SHOULDER ARTHROPLASTY Left 01/01/2018   Procedure: Left reverse shouler explant with antibiotic spacer placement;  Surgeon: Yolonda Kida, MD;  Location: [facility] ORS;  Service: Orthopedics;  Laterality: Left;  2.5 hrs   REVERSE SHOULDER ARTHROPLASTY Left 06/11/2018   Procedure: REVERSE TOTAL SHOULDER REVISION;  Surgeon: Yolonda Kida, MD;  Location: [facility] OR;  Service: Orthopedics;  Laterality: Left;   REVERSE SHOULDER ARTHROPLASTY Left 12/05/2018   Procedure: Left reverse shoulder explant, bone grafting;  Surgeon: Yolonda Kida, MD;  Location: [facility] OR;  Service: Orthopedics;  Laterality: Left;  2.5 hrs   REVISION OF TOTAL SHOULDER Left 12/26/2018   REVISION TOTAL  SHOULDER TO REVERSE TOTAL SHOULDER Left 07/16/2018   Procedure: Left shoulder irrigation and debridement with poly and head ball exchange;  Surgeon: Yolonda Kida, MD;  Location: [facility] OR;  Service: Orthopedics;  Laterality: Left;  2 hrs   SHOULDER ARTHROSCOPY Right 1980s/1990s   "bone spur removed"   SHOULDER OPEN ROTATOR CUFF REPAIR Right 1970s   SHOULDER SURGERY Left 03/06/2018   antibiotic spacer expalnt with glenoid bone grafting, antiobiotic spacer placement   TOTAL KNEE ARTHROPLASTY Bilateral    TOTAL SHOULDER ARTHROPLASTY Left ~ 2014-2017 X 2   "in [place]"   TOTAL SHOULDER REPLACEMENT Right ~ 2009   "in [place]"   TOTAL SHOULDER REVISION Left 12/26/2018   Procedure: Left prosthetic shoulder 1 component revision;  Surgeon: Yolonda Kida, MD;  Location: [facility] OR;  Service: Orthopedics;  Laterality: Left;  90 mins   WISDOM TOOTH EXTRACTION     Family  History  Problem Relation Age of Onset   Arthritis Mother    Arthritis Sister    Alzheimer's disease Brother    Arthritis Brother    Social History   Socioeconomic History   Marital status: Single    Spouse name: Not on file   Number of children: Not on file   Years of education: Not on file   Highest education level: Not on file  Occupational History   Not on file  Tobacco Use   Smoking status: Never   Smokeless tobacco: Never  Vaping Use   Vaping Use: Never used  Substance and Sexual Activity   Alcohol use: Not Currently   Drug use: Never   Sexual activity: Not Currently  Other Topics Concern   Not on file  Social History Narrative   Not on file   Social Determinants of Health   Financial Resource Strain: Low Risk  (10/02/2022)   Overall Financial Resource Strain (CARDIA)    Difficulty of Paying Living Expenses: Not hard at all  Food Insecurity: No Food Insecurity (10/02/2022)   Hunger Vital Sign    Worried About Running Out of Food in the Last Year: Never true    Ran Out of Food in the Last Year:  Never true  Transportation Needs: No Transportation Needs (10/02/2022)   PRAPARE - Administrator, Civil Service (Medical): No    Lack of Transportation (Non-Medical): No  Physical Activity: Sufficiently Active (10/02/2022)   Exercise Vital Sign    Days of Exercise per Week: 5 days    Minutes of Exercise per Session: 90 min  Stress: No Stress Concern Present (10/02/2022)   [employer] of Occupational Health - Occupational Stress Questionnaire    Feeling of Stress : Not at all  Social Connections: Moderately Isolated (10/02/2022)   Social Connection and Isolation Panel [NHANES]    Frequency of Communication with Friends and Family: More than three times a week    Frequency of Social Gatherings with Friends and Family: More than three times a week    Attends Religious Services: More than 4 times per year    Active Member of [employer] or Organizations: No    Attends Engineer, structural: Never    Marital Status: Never married    Tobacco Counseling Counseling given: Not Answered   Clinical Intake:  Pre-visit preparation completed: Yes  Pain : No/denies pain     BMI - recorded: 29.29 Nutritional Status: BMI 25 -29 Overweight Nutritional Risks: None Diabetes: No  How often do you need to have someone help you when you read instructions, pamphlets, or other written materials from your doctor or pharmacy?: 1 - Never  Diabetic?no  Interpreter Needed?: No  Information entered by :: Lanier Ensign, LPN   Activities of Daily Living    10/02/2022   11:18 AM  In your present state of health, do you have any difficulty performing the following activities:  Hearing? 0  Vision? 0  Difficulty concentrating or making decisions? 0  Walking or climbing stairs? 0  Dressing or bathing? 0  Doing errands, shopping? 0  Preparing Food and eating ? N  Using the Toilet? N  In the past six months, have you accidently leaked urine? N  Do you have problems with loss of bowel  control? N  Managing your Medications? N  Managing your Finances? N  Housekeeping or managing your Housekeeping? N    Patient Care Team: Ardith Dark, MD as PCP - General (  Family Medicine) Yolonda Kidaogers, Jason Patrick, MD as Consulting Physician (Orthopedic Surgery)  Indicate any recent Medical Services you may have received from other than [facility] providers in the past year (date may be approximate).     Assessment:   This is a routine wellness examination for [facility].  Hearing/Vision screen Hearing Screening - Comments:: Pt denies any hearing issues  Vision Screening - Comments:: Pt follows up with Dr Blima Ledgersally Miller for annual eye exams   Dietary issues and exercise activities discussed: Current Exercise Habits: Home exercise routine, Type of exercise: Other - see comments, Time (Minutes): > 60, Frequency (Times/Week): 5, Weekly Exercise (Minutes/Week): 0   Goals Addressed             This Visit's Progress    Patient Stated       Maintain health        Depression Screen    10/03/2022    8:39 AM 09/04/2022   12:57 PM 03/21/2022    9:03 AM 01/09/2020   10:39 AM 08/28/2019    9:31 AM 05/21/2019    9:35 AM 08/30/2018   10:35 AM  PHQ 2/9 Scores  PHQ - 2 Score 0 0 0 0 0 0 0    Fall Risk    10/02/2022   11:18 AM 09/04/2022   12:57 PM 03/21/2022    9:03 AM 01/09/2020   10:39 AM 08/28/2019    9:31 AM  Fall Risk   Falls in the past year? 0 0 0 0 0  Number falls in past yr: 0 0 0    Injury with Fall? 0 0 0    Risk for fall due to : Impaired vision No Fall Risks No Fall Risks No Fall Risks   Follow up Falls prevention discussed   Falls evaluation completed Falls evaluation completed    FALL RISK PREVENTION PERTAINING TO THE HOME:  Any stairs in or around the home? No  If so, are there any without handrails? No  Home free of loose throw rugs in walkways, pet beds, electrical cords, etc? Yes  Adequate lighting in your home to reduce risk of falls? Yes   ASSISTIVE DEVICES UTILIZED TO  PREVENT FALLS:  Life alert? No  Use of a cane, walker or w/c? No  Grab bars in the bathroom? No  Shower chair or bench in shower? No  Elevated toilet seat or a handicapped toilet? No   TIMED UP AND GO:  Was the test performed? No .  Cognitive Function:        Immunizations Immunization History  Administered Date(s) Administered   Influenza Inj Mdck Quad Pf 03/25/2018   Influenza,inj,Quad PF,6+ Mos 02/13/2017   Tdap 05/18/2012    TDAP status: Due, Education has been provided regarding the importance of this vaccine. Advised may receive this vaccine at local pharmacy or Health Dept. Aware to provide a copy of the vaccination record if obtained from local pharmacy or Health Dept. Verbalized acceptance and understanding.  Flu Vaccine status: Declined, Education has been provided regarding the importance of this vaccine but patient still declined. Advised may receive this vaccine at local pharmacy or Health Dept. Aware to provide a copy of the vaccination record if obtained from local pharmacy or Health Dept. Verbalized acceptance and understanding.  Pneumococcal vaccine status: Declined,  Education has been provided regarding the importance of this vaccine but patient still declined. Advised may receive this vaccine at local pharmacy or Health Dept. Aware to provide a copy of the vaccination record if  obtained from local pharmacy or Health Dept. Verbalized acceptance and understanding.   Covid-19 vaccine status: Declined, Education has been provided regarding the importance of this vaccine but patient still declined. Advised may receive this vaccine at local pharmacy or Health Dept.or vaccine clinic. Aware to provide a copy of the vaccination record if obtained from local pharmacy or Health Dept. Verbalized acceptance and understanding.  Qualifies for Shingles Vaccine? No    Screening Tests Health Maintenance  Topic Date Due   DTaP/Tdap/Td (2 - Td or Tdap) 05/18/2022   Pneumonia  Vaccine 56+ Years old (1 of 1 - PCV) 03/22/2023 (Originally 08/19/2021)   Hepatitis C Screening  03/22/2023 (Originally 08/19/1973)   COVID-19 Vaccine (1) 04/07/2023 (Originally 08/19/1960)   Zoster Vaccines- Shingrix (1 of 2) 06/21/2023 (Originally 08/20/1975)   COLONOSCOPY (Pts 45-79yrs Insurance coverage will need to be confirmed)  09/04/2023 (Originally 08/19/2001)   INFLUENZA VACCINE  01/25/2023   Medicare Annual Wellness (AWV)  10/03/2023   HPV VACCINES  Aged Out    Health Maintenance  Health Maintenance Due  Topic Date Due   DTaP/Tdap/Td (2 - Td or Tdap) 05/18/2022    Colorectal postponed    Additional Screening:  Hepatitis C Screening: does qualify;  Vision Screening: Recommended annual ophthalmology exams for early detection of glaucoma and other disorders of the eye. Is the patient up to date with their annual eye exam?  Yes  Who is the provider or what is the name of the office in which the patient attends annual eye exams? Dr Blima Ledger  If pt is not established with a provider, would they like to be referred to a provider to establish care? No .   Dental Screening: Recommended annual dental exams for proper oral hygiene  Community Resource Referral / Chronic Care Management: CRR required this visit?  No   CCM required this visit?  No      Plan:     I have personally reviewed and noted the following in the patient's chart:   Medical and social history Use of alcohol, tobacco or illicit drugs  Current medications and supplements including opioid prescriptions. Patient is not currently taking opioid prescriptions. Functional ability and status Nutritional status Physical activity Advanced directives List of other physicians Hospitalizations, surgeries, and ER visits in previous 12 months Vitals Screenings to include cognitive, depression, and falls Referrals and appointments  In addition, I have reviewed and discussed with patient certain preventive  protocols, quality metrics, and best practice recommendations. A written personalized care plan for preventive services as well as general preventive health recommendations were provided to patient.     Marzella Schlein, LPN   12/30/7207   Nurse Notes: none

## 2022-10-03 NOTE — Patient Instructions (Signed)
Geoffrey West , Thank you for taking time to come for your Medicare Wellness Visit. I appreciate your ongoing commitment to your health goals. Please review the following plan we discussed and let me know if I can assist you in the future.   These are the goals we discussed:  Goals      Patient Stated     Maintain health         This is a list of the screening recommended for you and due dates:  Health Maintenance  Topic Date Due   DTaP/Tdap/Td vaccine (2 - Td or Tdap) 05/18/2022   Pneumonia Vaccine (1 of 1 - PCV) 03/22/2023*   Hepatitis C Screening: USPSTF Recommendation to screen - Ages 18-79 yo.  03/22/2023*   COVID-19 Vaccine (1) 04/07/2023*   Zoster (Shingles) Vaccine (1 of 2) 06/21/2023*   Colon Cancer Screening  09/04/2023*   Flu Shot  01/25/2023   Medicare Annual Wellness Visit  10/03/2023   HPV Vaccine  Aged Out  *Topic was postponed. The date shown is not the original due date.    Advanced directives: Please bring a copy of your health care power of attorney and living will to the office at your convenience.  Conditions/risks identified: maintain health   Next appointment: Follow up in one year for your annual wellness visit.   Preventive Care 28 Years and Older, Male  Preventive care refers to lifestyle choices and visits with your health care provider that can promote health and wellness. What does preventive care include? A yearly physical exam. This is also called an annual well check. Dental exams once or twice a year. Routine eye exams. Ask your health care provider how often you should have your eyes checked. Personal lifestyle choices, including: Daily care of your teeth and gums. Regular physical activity. Eating a healthy diet. Avoiding tobacco and drug use. Limiting alcohol use. Practicing safe sex. Taking low doses of aspirin every day. Taking vitamin and mineral supplements as recommended by your health care provider. What happens during an annual  well check? The services and screenings done by your health care provider during your annual well check will depend on your age, overall health, lifestyle risk factors, and family history of disease. Counseling  Your health care provider may ask you questions about your: Alcohol use. Tobacco use. Drug use. Emotional well-being. Home and relationship well-being. Sexual activity. Eating habits. History of falls. Memory and ability to understand (cognition). Work and work Astronomer. Screening  You may have the following tests or measurements: Height, weight, and BMI. Blood pressure. Lipid and cholesterol levels. These may be checked every 5 years, or more frequently if you are over 56 years old. Skin check. Lung cancer screening. You may have this screening every year starting at age 27 if you have a 30-pack-year history of smoking and currently smoke or have quit within the past 15 years. Fecal occult blood test (FOBT) of the stool. You may have this test every year starting at age 64. Flexible sigmoidoscopy or colonoscopy. You may have a sigmoidoscopy every 5 years or a colonoscopy every 10 years starting at age 1. Prostate cancer screening. Recommendations will vary depending on your family history and other risks. Hepatitis C blood test. Hepatitis B blood test. Sexually transmitted disease (STD) testing. Diabetes screening. This is done by checking your blood sugar (glucose) after you have not eaten for a while (fasting). You may have this done every 1-3 years. Abdominal aortic aneurysm (AAA) screening. You may  need this if you are a current or former smoker. Osteoporosis. You may be screened starting at age 54 if you are at high risk. Talk with your health care provider about your test results, treatment options, and if necessary, the need for more tests. Vaccines  Your health care provider may recommend certain vaccines, such as: Influenza vaccine. This is recommended every  year. Tetanus, diphtheria, and acellular pertussis (Tdap, Td) vaccine. You may need a Td booster every 10 years. Zoster vaccine. You may need this after age 90. Pneumococcal 13-valent conjugate (PCV13) vaccine. One dose is recommended after age 91. Pneumococcal polysaccharide (PPSV23) vaccine. One dose is recommended after age 63. Talk to your health care provider about which screenings and vaccines you need and how often you need them. This information is not intended to replace advice given to you by your health care provider. Make sure you discuss any questions you have with your health care provider. Document Released: 07/09/2015 Document Revised: 03/01/2016 Document Reviewed: 04/13/2015 Elsevier Interactive Patient Education  2017 [place] Prevention in the Home Falls can cause injuries. They can happen to people of all ages. There are many things you can do to make your home safe and to help prevent falls. What can I do on the outside of my home? Regularly fix the edges of walkways and driveways and fix any cracks. Remove anything that might make you trip as you walk through a door, such as a raised step or threshold. Trim any bushes or trees on the path to your home. Use bright outdoor lighting. Clear any walking paths of anything that might make someone trip, such as rocks or tools. Regularly check to see if handrails are loose or broken. Make sure that both sides of any steps have handrails. Any raised decks and porches should have guardrails on the edges. Have any leaves, snow, or ice cleared regularly. Use sand or salt on walking paths during winter. Clean up any spills in your garage right away. This includes oil or grease spills. What can I do in the bathroom? Use night lights. Install grab bars by the toilet and in the tub and shower. Do not use towel bars as grab bars. Use non-skid mats or decals in the tub or shower. If you need to sit down in the shower, use a  plastic, non-slip stool. Keep the floor dry. Clean up any water that spills on the floor as soon as it happens. Remove soap buildup in the tub or shower regularly. Attach bath mats securely with double-sided non-slip rug tape. Do not have throw rugs and other things on the floor that can make you trip. What can I do in the bedroom? Use night lights. Make sure that you have a light by your bed that is easy to reach. Do not use any sheets or blankets that are too big for your bed. They should not hang down onto the floor. Have a firm chair that has side arms. You can use this for support while you get dressed. Do not have throw rugs and other things on the floor that can make you trip. What can I do in the kitchen? Clean up any spills right away. Avoid walking on wet floors. Keep items that you use a lot in easy-to-reach places. If you need to reach something above you, use a strong step stool that has a grab bar. Keep electrical cords out of the way. Do not use floor polish or wax that makes  floors slippery. If you must use wax, use non-skid floor wax. Do not have throw rugs and other things on the floor that can make you trip. What can I do with my stairs? Do not leave any items on the stairs. Make sure that there are handrails on both sides of the stairs and use them. Fix handrails that are broken or loose. Make sure that handrails are as long as the stairways. Check any carpeting to make sure that it is firmly attached to the stairs. Fix any carpet that is loose or worn. Avoid having throw rugs at the top or bottom of the stairs. If you do have throw rugs, attach them to the floor with carpet tape. Make sure that you have a light switch at the top of the stairs and the bottom of the stairs. If you do not have them, ask someone to add them for you. What else can I do to help prevent falls? Wear shoes that: Do not have high heels. Have rubber bottoms. Are comfortable and fit you  well. Are closed at the toe. Do not wear sandals. If you use a stepladder: Make sure that it is fully opened. Do not climb a closed stepladder. Make sure that both sides of the stepladder are locked into place. Ask someone to hold it for you, if possible. Clearly mark and make sure that you can see: Any grab bars or handrails. First and last steps. Where the edge of each step is. Use tools that help you move around (mobility aids) if they are needed. These include: Canes. Walkers. Scooters. Crutches. Turn on the lights when you go into a dark area. Replace any light bulbs as soon as they burn out. Set up your furniture so you have a clear path. Avoid moving your furniture around. If any of your floors are uneven, fix them. If there are any pets around you, be aware of where they are. Review your medicines with your doctor. Some medicines can make you feel dizzy. This can increase your chance of falling. Ask your doctor what other things that you can do to help prevent falls. This information is not intended to replace advice given to you by your health care provider. Make sure you discuss any questions you have with your health care provider. Document Released: 04/08/2009 Document Revised: 11/18/2015 Document Reviewed: 07/17/2014 Elsevier Interactive Patient Education  2017 [employer].

## 2022-10-09 NOTE — Progress Notes (Signed)
I connected with  [name] on 10/09/22 by a audio enabled telemedicine application and verified that I am speaking with the correct person using two identifiers.  Patient Location: Home  Provider Location: Office/Clinic  I discussed the limitations of evaluation and management by telemedicine. The patient expressed understanding and agreed to proceed.     Patient Medicare AWV questionnaire was completed by the patient on 10/02/22; I have confirmed that all information answered by patient is correct and no changes since this date.      Subjective:   Geoffrey West is a 67 y.o. male who presents for an Initial Medicare Annual Wellness Visit.  Review of Systems     Cardiac Risk Factors include: advanced age (>38men, >43 women)     Objective:    Today's Vitals   10/03/22 0835  Weight: 210 lb (95.3 kg)   Body mass index is 29.29 kg/m.     10/03/2022    8:40 AM 05/19/2020    9:56 AM 01/06/2020    6:00 PM 01/01/2020   10:24 AM 12/26/2018    1:36 PM 12/26/2018    5:56 AM 12/05/2018    1:00 PM  Advanced Directives  Does Patient Have a Medical Advance Directive? Yes No No No No No No  Type of Estate agent of [place];Living will        Copy of Healthcare Power of Attorney in Chart? No - copy requested        Would patient like information on creating a medical advance directive?  No - Patient declined No - Patient declined Yes (MAU/Ambulatory/Procedural Areas - Information given) No - Patient declined No - Patient declined Yes (MAU/Ambulatory/Procedural Areas - Information given)    Current Medications (verified) Outpatient Encounter Medications as of 10/03/2022  Medication Sig   allopurinol (ZYLOPRIM) 300 MG tablet Take 1 tablet (300 mg total) by mouth daily.   amLODipine (NORVASC) 10 MG tablet Take 1 tablet (10 mg total) by mouth at bedtime.   ARMOUR THYROID 30 MG tablet Take 30 mg by mouth daily before breakfast.    celecoxib (CELEBREX) 200 MG capsule  Take 1 capsule (200 mg total) by mouth 2 (two) times daily.   Coenzyme Q10 (CO Q-10) 300 MG CAPS Take 300 mg by mouth daily.   diphenhydrAMINE (BENADRYL) 25 MG tablet Take 25 mg by mouth daily as needed for allergies.   diphenhydramine-acetaminophen (TYLENOL PM) 25-500 MG TABS tablet Take 1 tablet by mouth at bedtime as needed (sleep).   fluticasone (FLONASE) 50 MCG/ACT nasal spray Place 2 sprays into both nostrils daily as needed for allergies or rhinitis.   Multiple Vitamin (MULTIVITAMIN WITH MINERALS) TABS tablet Take 1 tablet by mouth daily.   pantoprazole (PROTONIX) 40 MG tablet Take 1 tablet (40 mg total) by mouth every morning.   propranolol ER (INDERAL LA) 80 MG 24 hr capsule Take 1 capsule (80 mg total) by mouth every evening.   testosterone cypionate (DEPOTESTOSTERONE CYPIONATE) 200 MG/ML injection Inject 140 mg into the muscle every Wednesday.    [DISCONTINUED] atorvastatin (LIPITOR) 40 MG tablet Take 1 tablet (40 mg total) by mouth daily.   No facility-administered encounter medications on file as of 10/03/2022.    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   Rash 09/17/2019   Situational depression    "recently lost my mom and brother" (03/06/2018)   Wears contact lenses    Wears glasses    Past Surgical History:  Procedure Laterality Date   BACK SURGERY     COLONOSCOPY     COLONOSCOPY W/ BIOPSIES AND POLYPECTOMY      EXCISIONAL TOTAL SHOULDER ARTHROPLASTY WITH ANTIBIOTIC SPACER Left 03/06/2018   Procedure: Left shoulder antibiotic spacer expalnt with glenoid bone grafting, antiobiotic spacer placement;  Surgeon: Yolonda Kida, MD;  Location: [facility] OR;  Service: Orthopedics;  Laterality: Left;   EXCISIONAL TOTAL SHOULDER ARTHROPLASTY WITH ANTIBIOTIC SPACER Left 01/06/2020   Procedure: LEFT SHOULDER ARTHROPLASTY EXPLANT WITH ANTIBIOTIC SPACER PLACEMENT;  Surgeon: Yolonda Kida, MD;  Location: [facility] OR;  Service: Orthopedics;  Laterality: Left;  2.5 hrs RNFA   IR FLUORO GUIDE CV LINE RIGHT  12/26/2016   IRRIGATION AND DEBRIDEMENT SHOULDER Left 05/27/2020   Procedure: Left shoulder antibiotic spacer exchange;  Surgeon: Yolonda Kida, MD;  Location: [facility] OR;  Service: Orthopedics;  Laterality: Left;  120 mins   KNEE ARTHROSCOPY Bilateral    X 10 total arthroscopies   KNEE CARTILAGE SURGERY Left 1977   "cartilage removed"   LASIK Bilateral    LUMBAR DISC SURGERY  2000s   "ruptured disc"   REVERSE SHOULDER ARTHROPLASTY Left 12/08/2016   Procedure: Revision left total shoulder to reverse total shoulder arthroplasty;  Surgeon: Yolonda Kida, MD;  Location: [facility] OR;  Service: Orthopedics;  Laterality: Left;   REVERSE SHOULDER ARTHROPLASTY Left 01/01/2018   Procedure: Left reverse shouler explant with antibiotic spacer placement;  Surgeon: Yolonda Kida, MD;  Location: [facility] ORS;  Service: Orthopedics;  Laterality: Left;  2.5 hrs   REVERSE SHOULDER ARTHROPLASTY Left 06/11/2018   Procedure: REVERSE TOTAL SHOULDER REVISION;  Surgeon: Yolonda Kida, MD;  Location: [facility] OR;  Service: Orthopedics;  Laterality: Left;   REVERSE SHOULDER ARTHROPLASTY Left 12/05/2018   Procedure: Left reverse shoulder explant, bone grafting;  Surgeon: Yolonda Kida, MD;  Location: [facility] OR;  Service: Orthopedics;  Laterality: Left;  2.5 hrs   REVISION OF TOTAL SHOULDER Left 12/26/2018   REVISION TOTAL  SHOULDER TO REVERSE TOTAL SHOULDER Left 07/16/2018   Procedure: Left shoulder irrigation and debridement with poly and head ball exchange;  Surgeon: Yolonda Kida, MD;  Location: [facility] OR;  Service: Orthopedics;  Laterality: Left;  2 hrs   SHOULDER ARTHROSCOPY Right 1980s/1990s   "bone spur removed"   SHOULDER OPEN ROTATOR CUFF REPAIR Right 1970s   SHOULDER SURGERY Left 03/06/2018   antibiotic spacer expalnt with glenoid bone grafting, antiobiotic spacer placement   TOTAL KNEE ARTHROPLASTY Bilateral    TOTAL SHOULDER ARTHROPLASTY Left ~ 2014-2017 X 2   "in [place]"   TOTAL SHOULDER REPLACEMENT Right ~ 2009   "in [place]"   TOTAL SHOULDER REVISION Left 12/26/2018   Procedure: Left prosthetic shoulder 1 component revision;  Surgeon: Yolonda Kida, MD;  Location: [facility] OR;  Service: Orthopedics;  Laterality: Left;  90 mins   WISDOM TOOTH EXTRACTION     Family  History  Problem Relation Age of Onset   Arthritis Mother    Arthritis Sister    Alzheimer's disease Brother    Arthritis Brother    Social History   Socioeconomic History   Marital status: Single    Spouse name: Not on file   Number of children: Not on file   Years of education: Not on file   Highest education level: Not on file  Occupational History   Not on file  Tobacco Use   Smoking status: Never   Smokeless tobacco: Never  Vaping Use   Vaping Use: Never used  Substance and Sexual Activity   Alcohol use: Not Currently   Drug use: Never   Sexual activity: Not Currently  Other Topics Concern   Not on file  Social History Narrative   Not on file   Social Determinants of Health   Financial Resource Strain: Low Risk  (10/02/2022)   Overall Financial Resource Strain (CARDIA)    Difficulty of Paying Living Expenses: Not hard at all  Food Insecurity: No Food Insecurity (10/02/2022)   Hunger Vital Sign    Worried About Running Out of Food in the Last Year: Never true    Ran Out of Food in the Last Year:  Never true  Transportation Needs: No Transportation Needs (10/02/2022)   PRAPARE - Administrator, Civil Service (Medical): No    Lack of Transportation (Non-Medical): No  Physical Activity: Sufficiently Active (10/02/2022)   Exercise Vital Sign    Days of Exercise per Week: 5 days    Minutes of Exercise per Session: 90 min  Stress: No Stress Concern Present (10/02/2022)   [employer] of Occupational Health - Occupational Stress Questionnaire    Feeling of Stress : Not at all  Social Connections: Moderately Isolated (10/02/2022)   Social Connection and Isolation Panel [NHANES]    Frequency of Communication with Friends and Family: More than three times a week    Frequency of Social Gatherings with Friends and Family: More than three times a week    Attends Religious Services: More than 4 times per year    Active Member of [employer] or Organizations: No    Attends Engineer, structural: Never    Marital Status: Never married    Tobacco Counseling Counseling given: Not Answered   Clinical Intake:  Pre-visit preparation completed: Yes  Pain : No/denies pain     BMI - recorded: 29.29 Nutritional Status: BMI 25 -29 Overweight Nutritional Risks: None Diabetes: No  How often do you need to have someone help you when you read instructions, pamphlets, or other written materials from your doctor or pharmacy?: 1 - Never  Diabetic?no  Interpreter Needed?: No  Information entered by :: Lanier Ensign, LPN   Activities of Daily Living    10/02/2022   11:18 AM  In your present state of health, do you have any difficulty performing the following activities:  Hearing? 0  Vision? 0  Difficulty concentrating or making decisions? 0  Walking or climbing stairs? 0  Dressing or bathing? 0  Doing errands, shopping? 0  Preparing Food and eating ? N  Using the Toilet? N  In the past six months, have you accidently leaked urine? N  Do you have problems with loss of bowel  control? N  Managing your Medications? N  Managing your Finances? N  Housekeeping or managing your Housekeeping? N    Patient Care Team: Ardith Dark, MD as PCP - General (  Family Medicine) Yolonda Kida, MD as Consulting Physician (Orthopedic Surgery)  Indicate any recent Medical Services you may have received from other than [facility] providers in the past year (date may be approximate).     Assessment:   This is a routine wellness examination for [facility].  Hearing/Vision screen Hearing Screening - Comments:: Pt denies any hearing issues  Vision Screening - Comments:: Pt follows up with Dr Blima Ledger for annual eye exams   Dietary issues and exercise activities discussed: Current Exercise Habits: Home exercise routine, Type of exercise: Other - see comments, Time (Minutes): > 60, Frequency (Times/Week): 5, Weekly Exercise (Minutes/Week): 0   Goals Addressed             This Visit's Progress    Patient Stated       Maintain health       Depression Screen    10/03/2022    8:39 AM 09/04/2022   12:57 PM 03/21/2022    9:03 AM 01/09/2020   10:39 AM 08/28/2019    9:31 AM 05/21/2019    9:35 AM 08/30/2018   10:35 AM  PHQ 2/9 Scores  PHQ - 2 Score 0 0 0 0 0 0 0    Fall Risk    10/02/2022   11:18 AM 09/04/2022   12:57 PM 03/21/2022    9:03 AM 01/09/2020   10:39 AM 08/28/2019    9:31 AM  Fall Risk   Falls in the past year? 0 0 0 0 0  Number falls in past yr: 0 0 0    Injury with Fall? 0 0 0    Risk for fall due to : Impaired vision No Fall Risks No Fall Risks No Fall Risks   Follow up Falls prevention discussed   Falls evaluation completed Falls evaluation completed    FALL RISK PREVENTION PERTAINING TO THE HOME:  Any stairs in or around the home? No  If so, are there any without handrails? No  Home free of loose throw rugs in walkways, pet beds, electrical cords, etc? Yes  Adequate lighting in your home to reduce risk of falls? Yes   ASSISTIVE DEVICES UTILIZED TO  PREVENT FALLS:  Life alert? No  Use of a cane, walker or w/c? No  Grab bars in the bathroom? No  Shower chair or bench in shower? No  Elevated toilet seat or a handicapped toilet? No   TIMED UP AND GO:  Was the test performed? No .  Cognitive Function: declined         Immunizations Immunization History  Administered Date(s) Administered   Influenza Inj Mdck Quad Pf 03/25/2018   Influenza,inj,Quad PF,6+ Mos 02/13/2017   Tdap 05/18/2012    TDAP status: Due, Education has been provided regarding the importance of this vaccine. Advised may receive this vaccine at local pharmacy or Health Dept. Aware to provide a copy of the vaccination record if obtained from local pharmacy or Health Dept. Verbalized acceptance and understanding.  Flu Vaccine status: Declined, Education has been provided regarding the importance of this vaccine but patient still declined. Advised may receive this vaccine at local pharmacy or Health Dept. Aware to provide a copy of the vaccination record if obtained from local pharmacy or Health Dept. Verbalized acceptance and understanding.  Pneumococcal vaccine status: Declined,  Education has been provided regarding the importance of this vaccine but patient still declined. Advised may receive this vaccine at local pharmacy or Health Dept. Aware to provide a copy of the vaccination record  if obtained from local pharmacy or Health Dept. Verbalized acceptance and understanding.   Covid-19 vaccine status: Declined, Education has been provided regarding the importance of this vaccine but patient still declined. Advised may receive this vaccine at local pharmacy or Health Dept.or vaccine clinic. Aware to provide a copy of the vaccination record if obtained from local pharmacy or Health Dept. Verbalized acceptance and understanding.  Qualifies for Shingles Vaccine? No    Screening Tests Health Maintenance  Topic Date Due   DTaP/Tdap/Td (2 - Td or Tdap) 05/18/2022    Pneumonia Vaccine 44+ Years old (1 of 1 - PCV) 03/22/2023 (Originally 08/19/2021)   Hepatitis C Screening  03/22/2023 (Originally 08/19/1973)   COVID-19 Vaccine (1) 04/07/2023 (Originally 08/19/1960)   Zoster Vaccines- Shingrix (1 of 2) 06/21/2023 (Originally 08/20/1975)   COLONOSCOPY (Pts 45-59yrs Insurance coverage will need to be confirmed)  09/04/2023 (Originally 08/19/2001)   INFLUENZA VACCINE  01/25/2023   Medicare Annual Wellness (AWV)  10/03/2023   HPV VACCINES  Aged Out    Health Maintenance  Health Maintenance Due  Topic Date Due   DTaP/Tdap/Td (2 - Td or Tdap) 05/18/2022    Colorectal postponed    Additional Screening:  Hepatitis C Screening: does qualify;  Vision Screening: Recommended annual ophthalmology exams for early detection of glaucoma and other disorders of the eye. Is the patient up to date with their annual eye exam?  Yes  Who is the provider or what is the name of the office in which the patient attends annual eye exams? Dr Blima Ledger  If pt is not established with a provider, would they like to be referred to a provider to establish care? No .   Dental Screening: Recommended annual dental exams for proper oral hygiene  Community Resource Referral / Chronic Care Management: CRR required this visit?  No   CCM required this visit?  No      Plan:     I have personally reviewed and noted the following in the patient's chart:   Medical and social history Use of alcohol, tobacco or illicit drugs  Current medications and supplements including opioid prescriptions. Patient is not currently taking opioid prescriptions. Functional ability and status Nutritional status Physical activity Advanced directives List of other physicians Hospitalizations, surgeries, and ER visits in previous 12 months Vitals Screenings to include cognitive, depression, and falls Referrals and appointments  In addition, I have reviewed and discussed with patient certain  preventive protocols, quality metrics, and best practice recommendations. A written personalized care plan for preventive services as well as general preventive health recommendations were provided to patient.     Marzella Schlein, LPN   1/61/0960   Nurse Notes: Pt declined cognition test at this time , Pt was alert and very knowledgeable about questions asked.

## 2022-10-17 ENCOUNTER — Telehealth: Payer: Self-pay | Admitting: Family Medicine

## 2022-10-17 NOTE — Telephone Encounter (Signed)
Per Patient he has previously had shoulder surgery . He is requesting a  Dr note that will clear him to play golf . Pt states he needs note to specify medical extension  and use of expired rain check . Please advise @ [phone]

## 2022-10-18 NOTE — Telephone Encounter (Signed)
We can provide letter but recommend that he makes sure it is ok with orthopedics for him to resume golf at this point.  Katina Degree. Jimmey Ralph, MD 10/18/2022 7:34 AM

## 2022-10-18 NOTE — Telephone Encounter (Signed)
Left message to return call to our office at their convenience.  

## 2022-10-19 NOTE — Telephone Encounter (Signed)
Left message to return call to our office at their convenience.  

## 2022-10-20 NOTE — Telephone Encounter (Signed)
Pt returned Stella's call. Requests callback when able.

## 2022-10-23 ENCOUNTER — Encounter: Payer: Self-pay | Admitting: Family Medicine

## 2022-10-23 DIAGNOSIS — E039 Hypothyroidism, unspecified: Secondary | ICD-10-CM | POA: Diagnosis not present

## 2022-10-23 DIAGNOSIS — E291 Testicular hypofunction: Secondary | ICD-10-CM | POA: Diagnosis not present

## 2022-10-23 NOTE — Telephone Encounter (Signed)
Ok to write letter.  Katina Degree. Jimmey Ralph, MD 10/23/2022 11:10 AM

## 2022-10-23 NOTE — Telephone Encounter (Signed)
Patient has been cleared by ortho this past Fall. [employer] is just requiring letter from PCP stating it is OK to play golf again.

## 2022-10-23 NOTE — Telephone Encounter (Signed)
Letter sent to patient via MyChart.

## 2022-10-30 DIAGNOSIS — R5383 Other fatigue: Secondary | ICD-10-CM | POA: Diagnosis not present

## 2022-10-30 DIAGNOSIS — E291 Testicular hypofunction: Secondary | ICD-10-CM | POA: Diagnosis not present

## 2022-10-30 DIAGNOSIS — M2550 Pain in unspecified joint: Secondary | ICD-10-CM | POA: Diagnosis not present

## 2022-10-30 DIAGNOSIS — Z6828 Body mass index (BMI) 28.0-28.9, adult: Secondary | ICD-10-CM | POA: Diagnosis not present

## 2022-11-14 DIAGNOSIS — D2262 Melanocytic nevi of left upper limb, including shoulder: Secondary | ICD-10-CM | POA: Diagnosis not present

## 2022-11-14 DIAGNOSIS — Z85820 Personal history of malignant melanoma of skin: Secondary | ICD-10-CM | POA: Diagnosis not present

## 2022-11-14 DIAGNOSIS — I788 Other diseases of capillaries: Secondary | ICD-10-CM | POA: Diagnosis not present

## 2022-11-14 DIAGNOSIS — L738 Other specified follicular disorders: Secondary | ICD-10-CM | POA: Diagnosis not present

## 2023-01-19 DIAGNOSIS — T8484XA Pain due to internal orthopedic prosthetic devices, implants and grafts, initial encounter: Secondary | ICD-10-CM | POA: Diagnosis not present

## 2023-01-19 DIAGNOSIS — Z96652 Presence of left artificial knee joint: Secondary | ICD-10-CM | POA: Diagnosis not present

## 2023-01-19 DIAGNOSIS — Z96659 Presence of unspecified artificial knee joint: Secondary | ICD-10-CM | POA: Diagnosis not present

## 2023-01-19 DIAGNOSIS — Y831 Surgical operation with implant of artificial internal device as the cause of abnormal reaction of the patient, or of later complication, without mention of misadventure at the time of the procedure: Secondary | ICD-10-CM | POA: Diagnosis not present

## 2023-01-19 DIAGNOSIS — M25462 Effusion, left knee: Secondary | ICD-10-CM | POA: Diagnosis not present

## 2023-01-19 DIAGNOSIS — Z96653 Presence of artificial knee joint, bilateral: Secondary | ICD-10-CM | POA: Diagnosis not present

## 2023-01-24 DIAGNOSIS — Z96612 Presence of left artificial shoulder joint: Secondary | ICD-10-CM | POA: Diagnosis not present

## 2023-01-24 DIAGNOSIS — M25511 Pain in right shoulder: Secondary | ICD-10-CM | POA: Diagnosis not present

## 2023-01-29 DIAGNOSIS — E291 Testicular hypofunction: Secondary | ICD-10-CM | POA: Diagnosis not present

## 2023-01-29 DIAGNOSIS — E039 Hypothyroidism, unspecified: Secondary | ICD-10-CM | POA: Diagnosis not present

## 2023-01-31 ENCOUNTER — Ambulatory Visit (INDEPENDENT_AMBULATORY_CARE_PROVIDER_SITE_OTHER): Payer: Medicare Other | Admitting: Family Medicine

## 2023-01-31 ENCOUNTER — Encounter: Payer: Self-pay | Admitting: Family Medicine

## 2023-01-31 VITALS — BP 136/82 | HR 67 | Temp 98.0°F | Resp 18 | Ht 71.0 in | Wt 214.5 lb

## 2023-01-31 DIAGNOSIS — M2550 Pain in unspecified joint: Secondary | ICD-10-CM | POA: Diagnosis not present

## 2023-01-31 DIAGNOSIS — H9313 Tinnitus, bilateral: Secondary | ICD-10-CM

## 2023-01-31 DIAGNOSIS — Z6830 Body mass index (BMI) 30.0-30.9, adult: Secondary | ICD-10-CM | POA: Diagnosis not present

## 2023-01-31 DIAGNOSIS — E291 Testicular hypofunction: Secondary | ICD-10-CM | POA: Diagnosis not present

## 2023-01-31 DIAGNOSIS — R5383 Other fatigue: Secondary | ICD-10-CM | POA: Diagnosis not present

## 2023-01-31 MED ORDER — PREDNISONE 20 MG PO TABS: 40.0000 mg | ORAL_TABLET | Freq: Every day | ORAL | 0 refills | Status: AC

## 2023-01-31 MED ORDER — CLONAZEPAM 1 MG PO TABS: 1.0000 mg | ORAL_TABLET | Freq: Every day | ORAL | 0 refills | Status: DC | PRN

## 2023-01-31 NOTE — Progress Notes (Signed)
Subjective:     Patient ID: Geoffrey West, male    DOB: March 13, 1956, 67 y.o.   MRN: [id number]  Chief Complaint  Patient presents with   Ear Issue    High pitched sound in both ears that started Monday night, has happened before, but when away, louder at times    HPI Ear Complications - Complains of a high pitched sound in both ears that started on Monday night into Tuesday morning. He states that he took a left over clonazepam 1 mg tablet and felt relief. No SI. Says he has experienced this before about 2 yrs ago, but the symptoms  went away on its own. Denies any specific loud noises that could attribute to the ear ringing. Denies headaches, weakness, migraines and seizures, URI symptoms.[name] impaired hearing.   HTN - Pt is on amlodopine 10 mg and propranolol 80 mg daily.  Bp's running not checked .  Bp at initial check was 146/82. Bp at recheck was 136/82. No ha/dizziness/cp/palp/edema/cough/sob    Health Maintenance Due  Topic Date Due   DTaP/Tdap/Td (2 - Td or Tdap) 05/18/2022    Past Medical History:  Diagnosis Date   Anxiety    Arthritis    "all over" (03/06/2018)   Close exposure to COVID-19 virus 07/08/2019   GERD (gastroesophageal reflux disease)    Gout    "on daily RX" (03/06/2018)   History of kidney stones     passed x 1   Hypertension    Hypertension 12/25/2016   Loosening of total shoulder replacement (HCC)    left   Low testosterone 12/25/2016   Migraine    "haven't had more than 1/year since propanolol started" (03/06/2018)   Propionibacterium infection 08/28/2019   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   Rash 09/17/2019   Situational depression    "recently lost my mom and brother" (03/06/2018)   Wears contact lenses    Wears glasses     Past Surgical History:  Procedure Laterality Date   BACK SURGERY     COLONOSCOPY     COLONOSCOPY W/ BIOPSIES AND POLYPECTOMY     EXCISIONAL TOTAL SHOULDER ARTHROPLASTY WITH ANTIBIOTIC SPACER Left  03/06/2018   Procedure: Left shoulder antibiotic spacer expalnt with glenoid bone grafting, antiobiotic spacer placement;  Surgeon: Yolonda Kida, MD;  Location: [facility] OR;  Service: Orthopedics;  Laterality: Left;   EXCISIONAL TOTAL SHOULDER ARTHROPLASTY WITH ANTIBIOTIC SPACER Left 01/06/2020   Procedure: LEFT SHOULDER ARTHROPLASTY EXPLANT WITH ANTIBIOTIC SPACER PLACEMENT;  Surgeon: Yolonda Kida, MD;  Location: [facility] OR;  Service: Orthopedics;  Laterality: Left;  2.5 hrs RNFA   IR FLUORO GUIDE CV LINE RIGHT  12/26/2016   IRRIGATION AND DEBRIDEMENT SHOULDER Left 05/27/2020   Procedure: Left shoulder antibiotic spacer exchange;  Surgeon: Yolonda Kida, MD;  Location: [facility] OR;  Service: Orthopedics;  Laterality: Left;  120 mins   KNEE ARTHROSCOPY Bilateral    X 10 total arthroscopies   KNEE CARTILAGE SURGERY Left 1977   "cartilage removed"   LASIK Bilateral    LUMBAR DISC SURGERY  2000s   "ruptured disc"   REVERSE SHOULDER ARTHROPLASTY Left 12/08/2016   Procedure: Revision left total shoulder to reverse total shoulder arthroplasty;  Surgeon: Yolonda Kida, MD;  Location: [facility] OR;  Service: Orthopedics;  Laterality: Left;   REVERSE SHOULDER ARTHROPLASTY Left 01/01/2018   Procedure: Left reverse shouler explant with antibiotic spacer placement;  Surgeon: Yolonda Kida, MD;  Location: [facility] ORS;  Service: Orthopedics;  Laterality: Left;  2.5 hrs   REVERSE SHOULDER ARTHROPLASTY Left 06/11/2018   Procedure: REVERSE TOTAL SHOULDER REVISION;  Surgeon: Yolonda Kida, MD;  Location: [facility] OR;  Service: Orthopedics;  Laterality: Left;   REVERSE SHOULDER ARTHROPLASTY Left 12/05/2018   Procedure: Left reverse shoulder explant, bone grafting;  Surgeon: Yolonda Kida, MD;  Location[facility] OR;  Service: Orthopedics;  Laterality: Left;  2.5 hrs   REVISION OF TOTAL SHOULDER Left 12/26/2018   REVISION TOTAL SHOULDER TO REVERSE TOTAL SHOULDER Left 07/16/2018   Procedure: Left shoulder  irrigation and debridement with poly and head ball exchange;  Surgeon: Yolonda Kida, MD;  Location: [facility] OR;  Service: Orthopedics;  Laterality: Left;  2 hrs   SHOULDER ARTHROSCOPY Right 1980s/1990s   "bone spur removed"   SHOULDER OPEN ROTATOR CUFF REPAIR Right 1970s   SHOULDER SURGERY Left 03/06/2018   antibiotic spacer expalnt with glenoid bone grafting, antiobiotic spacer placement   TOTAL KNEE ARTHROPLASTY Bilateral    TOTAL SHOULDER ARTHROPLASTY Left ~ 2014-2017 X 2   "in [place]"   TOTAL SHOULDER REPLACEMENT Right ~ 2009   "in [place]"   TOTAL SHOULDER REVISION Left 12/26/2018   Procedure: Left prosthetic shoulder 1 component revision;  Surgeon: Yolonda Kida, MD;  Location: [facility] OR;  Service: Orthopedics;  Laterality: Left;  90 mins   WISDOM TOOTH EXTRACTION       Current Outpatient Medications:    allopurinol (ZYLOPRIM) 300 MG tablet, Take 1 tablet (300 mg total) by mouth daily., Disp: 90 tablet, Rfl: 3   amLODipine (NORVASC) 10 MG tablet, Take 1 tablet (10 mg total) by mouth at bedtime., Disp: 90 tablet, Rfl: 3   anastrozole (ARIMIDEX) 1 MG tablet, 0.5 tablet Orally Once a week for 90 days, Disp: , Rfl:    ARMOUR THYROID 60 MG tablet, 1 tablet on an empty stomach Orally Once a day for 90 days, Disp: , Rfl:    celecoxib (CELEBREX) 200 MG capsule, Take 1 capsule (200 mg total) by mouth 2 (two) times daily., Disp: 180 capsule, Rfl: 3   Coenzyme Q10 (CO Q-10) 300 MG CAPS, Take 300 mg by mouth daily., Disp: , Rfl:    diphenhydramine-acetaminophen (TYLENOL PM) 25-500 MG TABS tablet, Take 1 tablet by mouth at bedtime as needed (sleep)., Disp: , Rfl:    fluticasone (FLONASE) 50 MCG/ACT nasal spray, Place 2 sprays into both nostrils daily as needed for allergies or rhinitis., Disp: 11.1 mL, Rfl: 1   Melatonin 10 MG TABS, Take by mouth., Disp: , Rfl:    Multiple Vitamin (MULTIVITAMIN WITH MINERALS) TABS tablet, Take 1 tablet by mouth daily., Disp: , Rfl:    NEEDLE, DISP, 18  G (B-D HYPODERMIC NEEDLE 18GX1.5") 18G X 1-1/2" MISC, Hypodermic Needles 18 gauge x 1 1/2"  USE AS DIRECTED WITH TESTOSTERONE, Disp: , Rfl:    pantoprazole (PROTONIX) 40 MG tablet, Take 1 tablet (40 mg total) by mouth every morning., Disp: 90 tablet, Rfl: 3   predniSONE (DELTASONE) 20 MG tablet, Take 2 tablets (40 mg total) by mouth daily with breakfast for 5 days., Disp: 10 tablet, Rfl: 0   propranolol ER (INDERAL LA) 80 MG 24 hr capsule, Take 1 capsule (80 mg total) by mouth every evening., Disp: 90 capsule, Rfl: 3   testosterone cypionate (DEPOTESTOSTERONE CYPIONATE) 200 MG/ML injection, Inject 140 mg into the muscle every Wednesday. , Disp: , Rfl: 5   traMADol (ULTRAM) 50 MG tablet, Take 50 mg by mouth 3 (three) times daily as  needed., Disp: , Rfl:    clonazePAM (KLONOPIN) 1 MG tablet, Take 1 tablet (1 mg total) by mouth daily as needed for anxiety., Disp: 10 tablet, Rfl: 0  Allergies  Allergen Reactions   Fentanyl Shortness Of Breath and Other (See Comments)    Reaction to [facility] ONLY > ? DOSE REGULATION ?    Gabapentin Other (See Comments)   Oxycodone Other (See Comments)    Pt states he goes through hot and cold flashes withdrawal like symptoms   ROS neg/noncontributory except as noted HPI/below      Objective:     BP 136/82 (Patient Position: Sitting, Cuff Size: Large)   Pulse 67   Temp 98 F (36.7 C) (Temporal)   Resp 18   Ht 5\' 11"  (1.803 m)   Wt 214 lb 8 oz (97.3 kg)   SpO2 97%   BMI 29.92 kg/m  Wt Readings from Last 3 Encounters:  01/31/23 214 lb 8 oz (97.3 kg)  10/03/22 210 lb (95.3 kg)  09/04/22 212 lb 12.8 oz (96.5 kg)    Physical Exam   Gen: WDWN NAD HEENT: NCAT, conjunctiva not injected, sclera nonicteric TM WNL B, OP moist, no exudates  NECK:  supple, no thyromegaly, no nodes, no carotid bruits CARDIAC: RRR, S1S2+, no murmur EXT:  no edema MSK: no gross abnormalities.  NEURO: A&O x3.  CN II-XII intact.  PSYCH: normal mood. Good eye contact      Assessment & Plan:  Tinnitus of both ears  Other orders -     predniSONE; Take 2 tablets (40 mg total) by mouth daily with breakfast for 5 days.  Dispense: 10 tablet; Refill: 0 -     clonazePAM; Take 1 tablet (1 mg total) by mouth daily as needed for anxiety.  Dispense: 10 tablet; Refill: 0  Bilateral tinnitus-new problem.  Has had this about 2 years ago.  No precipitating factors.  Discussed that it may be long-term.  Will do prednisone 40 mg daily for 5 days.  Also, per patient request-clonazepam 1 mg daily as needed if bad.  PDMP was checked.  If new symptoms come up, other symptoms, let us know.  May be from cox-2, other meds as well.  No follow-ups on file.  Germaine Pomfret Rice,acting as a scribe for Angelena Sole, MD.,have documented all relevant documentation on the behalf of Angelena Sole, MD,as directed by  Angelena Sole, MD while in the presence of Angelena Sole, MD.  I, Angelena Sole, MD, have reviewed all documentation for this visit. The documentation on 01/31/23 for the exam, diagnosis, procedures, and orders are all accurate and complete.  Angelena Sole, MD

## 2023-01-31 NOTE — Patient Instructions (Signed)

## 2023-02-07 ENCOUNTER — Ambulatory Visit: Payer: BC Managed Care – PPO | Admitting: Internal Medicine

## 2023-02-19 DIAGNOSIS — I10 Essential (primary) hypertension: Secondary | ICD-10-CM | POA: Diagnosis not present

## 2023-02-19 DIAGNOSIS — M2550 Pain in unspecified joint: Secondary | ICD-10-CM | POA: Diagnosis not present

## 2023-02-19 DIAGNOSIS — E291 Testicular hypofunction: Secondary | ICD-10-CM | POA: Diagnosis not present

## 2023-02-19 DIAGNOSIS — R739 Hyperglycemia, unspecified: Secondary | ICD-10-CM | POA: Diagnosis not present

## 2023-03-08 DIAGNOSIS — R69 Illness, unspecified: Secondary | ICD-10-CM | POA: Diagnosis not present

## 2023-03-14 DIAGNOSIS — H524 Presbyopia: Secondary | ICD-10-CM | POA: Diagnosis not present

## 2023-03-27 DIAGNOSIS — H524 Presbyopia: Secondary | ICD-10-CM | POA: Diagnosis not present

## 2023-04-09 DIAGNOSIS — B960 Mycoplasma pneumoniae [M. pneumoniae] as the cause of diseases classified elsewhere: Secondary | ICD-10-CM | POA: Diagnosis not present

## 2023-04-09 DIAGNOSIS — Z79890 Hormone replacement therapy (postmenopausal): Secondary | ICD-10-CM | POA: Diagnosis not present

## 2023-04-09 DIAGNOSIS — M2559 Pain in other specified joint: Secondary | ICD-10-CM | POA: Diagnosis not present

## 2023-04-09 DIAGNOSIS — I10 Essential (primary) hypertension: Secondary | ICD-10-CM | POA: Diagnosis not present

## 2023-04-09 DIAGNOSIS — E291 Testicular hypofunction: Secondary | ICD-10-CM | POA: Diagnosis not present

## 2023-04-09 DIAGNOSIS — D510 Vitamin B12 deficiency anemia due to intrinsic factor deficiency: Secondary | ICD-10-CM | POA: Diagnosis not present

## 2023-04-19 ENCOUNTER — Other Ambulatory Visit: Payer: Self-pay | Admitting: Family Medicine

## 2023-04-30 DIAGNOSIS — Z79890 Hormone replacement therapy (postmenopausal): Secondary | ICD-10-CM | POA: Diagnosis not present

## 2023-04-30 DIAGNOSIS — E291 Testicular hypofunction: Secondary | ICD-10-CM | POA: Diagnosis not present

## 2023-04-30 DIAGNOSIS — E039 Hypothyroidism, unspecified: Secondary | ICD-10-CM | POA: Diagnosis not present

## 2023-05-02 DIAGNOSIS — R5383 Other fatigue: Secondary | ICD-10-CM | POA: Diagnosis not present

## 2023-05-02 DIAGNOSIS — M2550 Pain in unspecified joint: Secondary | ICD-10-CM | POA: Diagnosis not present

## 2023-05-02 DIAGNOSIS — E291 Testicular hypofunction: Secondary | ICD-10-CM | POA: Diagnosis not present

## 2023-05-09 DIAGNOSIS — M069 Rheumatoid arthritis, unspecified: Secondary | ICD-10-CM | POA: Diagnosis not present

## 2023-05-09 DIAGNOSIS — M3581 Multisystem inflammatory syndrome: Secondary | ICD-10-CM | POA: Diagnosis not present

## 2023-05-09 DIAGNOSIS — D8989 Other specified disorders involving the immune mechanism, not elsewhere classified: Secondary | ICD-10-CM | POA: Diagnosis not present

## 2023-05-09 DIAGNOSIS — M064 Inflammatory polyarthropathy: Secondary | ICD-10-CM | POA: Diagnosis not present

## 2023-05-18 DIAGNOSIS — D89839 Cytokine release syndrome, grade unspecified: Secondary | ICD-10-CM | POA: Diagnosis not present

## 2023-05-18 DIAGNOSIS — D8989 Other specified disorders involving the immune mechanism, not elsewhere classified: Secondary | ICD-10-CM | POA: Diagnosis not present

## 2023-06-13 ENCOUNTER — Other Ambulatory Visit: Payer: Self-pay | Admitting: Family Medicine

## 2023-06-13 DIAGNOSIS — Z111 Encounter for screening for respiratory tuberculosis: Secondary | ICD-10-CM | POA: Diagnosis not present

## 2023-06-21 ENCOUNTER — Other Ambulatory Visit: Payer: Self-pay | Admitting: *Deleted

## 2023-06-21 MED ORDER — PROPRANOLOL HCL ER 80 MG PO CP24: 80.0000 mg | ORAL_CAPSULE | Freq: Every evening | ORAL | 1 refills | Status: DC

## 2023-06-21 MED ORDER — CELECOXIB 200 MG PO CAPS: 200.0000 mg | ORAL_CAPSULE | Freq: Two times a day (BID) | ORAL | 0 refills | Status: DC

## 2023-06-21 MED ORDER — AMLODIPINE BESYLATE 10 MG PO TABS: 10.0000 mg | ORAL_TABLET | Freq: Every day | ORAL | 1 refills | Status: DC

## 2023-07-30 DIAGNOSIS — E039 Hypothyroidism, unspecified: Secondary | ICD-10-CM | POA: Diagnosis not present

## 2023-07-30 DIAGNOSIS — E291 Testicular hypofunction: Secondary | ICD-10-CM | POA: Diagnosis not present

## 2023-07-30 DIAGNOSIS — Z87898 Personal history of other specified conditions: Secondary | ICD-10-CM | POA: Diagnosis not present

## 2023-08-01 ENCOUNTER — Other Ambulatory Visit: Payer: Self-pay | Admitting: Family Medicine

## 2023-08-01 ENCOUNTER — Other Ambulatory Visit: Payer: Self-pay | Admitting: *Deleted

## 2023-08-01 MED ORDER — FLUTICASONE PROPIONATE 50 MCG/ACT NA SUSP: 2.0000 | Freq: Every day | NASAL | 1 refills | Status: DC | PRN

## 2023-08-01 MED ORDER — CELECOXIB 200 MG PO CAPS: 200.0000 mg | ORAL_CAPSULE | Freq: Two times a day (BID) | ORAL | 0 refills | Status: DC

## 2023-08-01 NOTE — Telephone Encounter (Signed)
 Prescription Request  08/01/2023  LOV: 09/04/2022  What is the name of the medication or equipment?  celecoxib  (CELEBREX ) 200 MG capsule  fluticasone  (FLONASE ) 50 MCG/ACT nasal spray  traMADol  (ULTRAM ) 50 MG tablet  Have you contacted your pharmacy to request a refill? Yes   Which pharmacy would you like this sent to?   [facility] [id number] - [place], [place] - [address] Phone: [phone]  Fax: [phone]      Patient notified that their request is being sent to the clinical staff for review and that they should receive a response within 2 business days.   Please advise at Mobile [phone] (mobile)

## 2023-08-01 NOTE — Telephone Encounter (Signed)
 Rx Celebrex  and Flonase  send to [facility]

## 2023-08-02 DIAGNOSIS — E291 Testicular hypofunction: Secondary | ICD-10-CM | POA: Diagnosis not present

## 2023-08-02 DIAGNOSIS — M2550 Pain in unspecified joint: Secondary | ICD-10-CM | POA: Diagnosis not present

## 2023-08-02 DIAGNOSIS — R5383 Other fatigue: Secondary | ICD-10-CM | POA: Diagnosis not present

## 2023-08-02 MED ORDER — TRAMADOL HCL 50 MG PO TABS: 50.0000 mg | ORAL_TABLET | Freq: Three times a day (TID) | ORAL | 5 refills | Status: DC | PRN

## 2023-08-03 DIAGNOSIS — A499 Bacterial infection, unspecified: Secondary | ICD-10-CM | POA: Diagnosis not present

## 2023-08-03 DIAGNOSIS — D8989 Other specified disorders involving the immune mechanism, not elsewhere classified: Secondary | ICD-10-CM | POA: Diagnosis not present

## 2023-08-03 DIAGNOSIS — M3581 Multisystem inflammatory syndrome: Secondary | ICD-10-CM | POA: Diagnosis not present

## 2023-08-03 DIAGNOSIS — M3119 Other thrombotic microangiopathy: Secondary | ICD-10-CM | POA: Diagnosis not present

## 2023-08-16 ENCOUNTER — Other Ambulatory Visit: Payer: Self-pay | Admitting: Family Medicine

## 2023-09-03 DIAGNOSIS — Z471 Aftercare following joint replacement surgery: Secondary | ICD-10-CM | POA: Diagnosis not present

## 2023-09-03 DIAGNOSIS — Z96611 Presence of right artificial shoulder joint: Secondary | ICD-10-CM | POA: Diagnosis not present

## 2023-09-03 DIAGNOSIS — Z96612 Presence of left artificial shoulder joint: Secondary | ICD-10-CM | POA: Diagnosis not present

## 2023-09-13 ENCOUNTER — Ambulatory Visit (INDEPENDENT_AMBULATORY_CARE_PROVIDER_SITE_OTHER): Admitting: Family Medicine

## 2023-09-13 ENCOUNTER — Encounter: Payer: Self-pay | Admitting: Family Medicine

## 2023-09-13 VITALS — BP 135/76 | HR 63 | Temp 97.7°F | Ht 71.0 in | Wt 215.8 lb

## 2023-09-13 DIAGNOSIS — I10 Essential (primary) hypertension: Secondary | ICD-10-CM | POA: Diagnosis not present

## 2023-09-13 DIAGNOSIS — M2550 Pain in unspecified joint: Secondary | ICD-10-CM

## 2023-09-13 NOTE — Progress Notes (Signed)
   Geoffrey West is a 68 y.o. male who presents today for an office visit.  Assessment/Plan:  Chronic Problems Addressed Today: Polyarthralgia Had lengthy discussion with patient today regarding his polyarthralgia.  This has been going on for many years.  Has been following with integrative care and has had quite a bit of workup done which is mostly unrevealing though was notable for elevated IL-17.  They had been working on getting him started on Cosentyx however he was having trouble with getting this started.  He believes that he may have rheumatoid arthritis.  He did also recently see his orthopedics who recommended that he start methotrexate.  He would like for [place] to prescribe this today.  Discussed with patient that methotrexate is not something that we routinely prescribed from primary care perspective though it would be a good idea for him to see rheumatologist for this.  We also did discuss his recent labs.  Do not look like he has been HLA B27 checked.  We did discuss checking this today however he declined.  Will place referral for him to see rheumatology.  He has already contacted their office and stated that he will be able to see them within the next few weeks.  Advised patient is would not be able to start him on methotrexate today.  He does likely have an underlying rheumatologic or autoimmune disease contributing to his symptoms.  He also does have some underlying osteoarthritis as well.  Will defer further management to rheumatology.  He has tramadol to use as needed.  We did discuss short prednisone burst however he declined for today.  He will let us know if he needs any further assistance though hopefully he will be able to see rheumatology soon.    Hypertension Blood pressure at goal today.  Will continue amlodipine 10 mg daily.  He will let us know if persistently elevated.  He will come back in a few months for CPE and we can recheck at that time.     Subjective:   HPI:  See Assessment / plan for status of chronic conditions.  Patient is here today for follow-up.  I last saw him over a year ago.  Since her last visit he has had ongoing issues with joint pain and inflammation.  He has been following with orthopedics as well as integrative care in [place].  He has had quite a bit of blood work done without any obvious diagnosis.  He did recently see his orthopedist at [facility] and they had discussed potentially getting him started on methotrexate and told him to follow-up with his PCP to ask about getting this done.  He is interested in starting methotrexate.  His blood work thus far has been positive for high levels of IL-17 but has otherwise been negative.  He is using tramadol as needed.        Objective:  Physical Exam: BP 135/76   Pulse 63   Temp 97.7 F (36.5 C) (Temporal)   Ht 5\' 11"  (1.803 m)   Wt 215 lb 12.8 oz (97.9 kg)   SpO2 98%   BMI 30.10 kg/m   Gen: No acute distress, resting comfortably Neuro: Grossly normal, moves all extremities Psych: Normal affect and thought content      Geoffrey West M. Jimmey Ralph, MD 09/13/2023 10:16 AM

## 2023-09-13 NOTE — Patient Instructions (Addendum)
 It was very nice to see you today!  We will refer you to rheumatology.  Let us know if you need anything else.   Return if symptoms worsen or fail to improve.   Take care, Dr Jimmey Ralph  PLEASE NOTE:  If you had any lab tests, please let us know if you have not heard back within a few days. You may see your results on mychart before we have a chance to review them but we will give you a call once they are reviewed by [place].   If we ordered any referrals today, please let us know if you have not heard from their office within the next week.   If you had any urgent prescriptions sent in today, please check with the pharmacy within an hour of our visit to make sure the prescription was transmitted appropriately.   Please try these tips to maintain a healthy lifestyle:  Eat at least 3 REAL meals and 1-2 snacks per day.  Aim for no more than 5 hours between eating.  If you eat breakfast, please do so within one hour of getting up.   Each meal should contain half fruits/vegetables, one quarter protein, and one quarter carbs (no bigger than a computer mouse)  Cut down on sweet beverages. This includes juice, soda, and sweet tea.   Drink at least 1 glass of water with each meal and aim for at least 8 glasses per day  Exercise at least 150 minutes every week.

## 2023-09-13 NOTE — Assessment & Plan Note (Addendum)
 Had lengthy discussion with patient today regarding his polyarthralgia.  This has been going on for many years.  Has been following with integrative care and has had quite a bit of workup done which is mostly unrevealing though was notable for elevated IL-17.  They had been working on getting him started on Cosentyx however he was having trouble with getting this started.  He believes that he may have rheumatoid arthritis.  He did also recently see his orthopedics who recommended that he start methotrexate.  He would like for [place] to prescribe this today.  Discussed with patient that methotrexate is not something that we routinely prescribed from primary care perspective though it would be a good idea for him to see rheumatologist for this.  We also did discuss his recent labs.  Do not look like he has been HLA B27 checked.  We did discuss checking this today however he declined.  Will place referral for him to see rheumatology.  He has already contacted their office and stated that he will be able to see them within the next few weeks.  Advised patient is would not be able to start him on methotrexate today.  He does likely have an underlying rheumatologic or autoimmune disease contributing to his symptoms.  He also does have some underlying osteoarthritis as well.  Will defer further management to rheumatology.  He has tramadol to use as needed.  We did discuss short prednisone burst however he declined for today.  He will let us know if he needs any further assistance though hopefully he will be able to see rheumatology soon.

## 2023-09-13 NOTE — Assessment & Plan Note (Signed)
 Blood pressure at goal today.  Will continue amlodipine 10 mg daily.  He will let us know if persistently elevated.  He will come back in a few months for CPE and we can recheck at that time.

## 2023-09-15 ENCOUNTER — Other Ambulatory Visit: Payer: Self-pay | Admitting: Family Medicine

## 2023-09-17 DIAGNOSIS — M2560 Stiffness of unspecified joint, not elsewhere classified: Secondary | ICD-10-CM | POA: Diagnosis not present

## 2023-09-17 DIAGNOSIS — M2540 Effusion, unspecified joint: Secondary | ICD-10-CM | POA: Diagnosis not present

## 2023-09-17 DIAGNOSIS — R5383 Other fatigue: Secondary | ICD-10-CM | POA: Diagnosis not present

## 2023-09-17 DIAGNOSIS — M109 Gout, unspecified: Secondary | ICD-10-CM | POA: Diagnosis not present

## 2023-09-24 DIAGNOSIS — M353 Polymyalgia rheumatica: Secondary | ICD-10-CM | POA: Diagnosis not present

## 2023-09-24 DIAGNOSIS — M2560 Stiffness of unspecified joint, not elsewhere classified: Secondary | ICD-10-CM | POA: Diagnosis not present

## 2023-09-24 DIAGNOSIS — M109 Gout, unspecified: Secondary | ICD-10-CM | POA: Diagnosis not present

## 2023-09-24 DIAGNOSIS — R5383 Other fatigue: Secondary | ICD-10-CM | POA: Diagnosis not present

## 2023-09-26 DIAGNOSIS — L905 Scar conditions and fibrosis of skin: Secondary | ICD-10-CM | POA: Diagnosis not present

## 2023-09-26 DIAGNOSIS — L738 Other specified follicular disorders: Secondary | ICD-10-CM | POA: Diagnosis not present

## 2023-09-26 DIAGNOSIS — D1801 Hemangioma of skin and subcutaneous tissue: Secondary | ICD-10-CM | POA: Diagnosis not present

## 2023-09-26 DIAGNOSIS — L821 Other seborrheic keratosis: Secondary | ICD-10-CM | POA: Diagnosis not present

## 2023-10-09 ENCOUNTER — Ambulatory Visit: Payer: BC Managed Care – PPO

## 2023-10-09 VITALS — Ht 71.0 in | Wt 207.0 lb

## 2023-10-09 DIAGNOSIS — Z0000 Encounter for general adult medical examination without abnormal findings: Secondary | ICD-10-CM

## 2023-10-09 NOTE — Progress Notes (Signed)
 Subjective:   Geoffrey West is a 68 y.o. who presents for a Medicare Wellness preventive visit.  Visit Complete: Virtual I connected with  Geoffrey West on 10/09/23 by a audio enabled telemedicine application and verified that I am speaking with the correct person using two identifiers.  Patient Location: Home  Provider Location: Home Office  I discussed the limitations of evaluation and management by telemedicine. The patient expressed understanding and agreed to proceed.  Vital Signs: Because this visit was a virtual/telehealth visit, some criteria may be missing or patient reported. Any vitals not documented were not able to be obtained and vitals that have been documented are patient reported.  VideoDeclined- This patient declined Librarian, academic. Therefore the visit was completed with audio only.  Persons Participating in Visit: Patient.  AWV Questionnaire: No: Patient Medicare AWV questionnaire was not completed prior to this visit.  Cardiac Risk Factors include: advanced age (>86men, >48 women);dyslipidemia;hypertension;male gender     Objective:    Today's Vitals   10/09/23 0843  Weight: 207 lb (93.9 kg)  Height: 5\' 11"  (1.803 m)   Body mass index is 28.87 kg/m.     10/09/2023    8:47 AM 10/03/2022    8:40 AM 05/19/2020    9:56 AM 01/06/2020    6:00 PM 01/01/2020   10:24 AM 12/26/2018    1:36 PM 12/26/2018    5:56 AM  Advanced Directives  Does Patient Have a Medical Advance Directive? Yes Yes No No No No No  Type of Estate agent of [place];Living will Healthcare Power of [name];Living will       Copy of Healthcare Power of Attorney in Chart? No - copy requested No - copy requested       Would patient like information on creating a medical advance directive?   No - Patient declined No - Patient declined Yes (MAU/Ambulatory/Procedural Areas - Information given) No - Patient declined No - Patient declined     Current Medications (verified) Outpatient Encounter Medications as of 10/09/2023  Medication Sig   allopurinol (ZYLOPRIM) 300 MG tablet Take 1 tablet (300 mg total) by mouth daily.   amLODipine (NORVASC) 10 MG tablet TAKE 1 TABLET BY MOUTH AT BEDTIME   anastrozole (ARIMIDEX) 1 MG tablet 0.5 tablet Orally Once a week for 90 days   ARMOUR THYROID 60 MG tablet 1 tablet on an empty stomach Orally Once a day for 90 days   celecoxib (CELEBREX) 200 MG capsule Take 1 capsule (200 mg total) by mouth 2 (two) times daily.   clonazePAM (KLONOPIN) 1 MG tablet Take 1 tablet (1 mg total) by mouth daily as needed for anxiety.   Coenzyme Q10 (CO Q-10) 300 MG CAPS Take 300 mg by mouth daily.   diphenhydramine-acetaminophen (TYLENOL PM) 25-500 MG TABS tablet Take 1 tablet by mouth at bedtime as needed (sleep).   fluticasone (FLONASE) 50 MCG/ACT nasal spray Place 2 sprays into both nostrils daily as needed for allergies or rhinitis.   Melatonin 10 MG TABS Take by mouth.   Multiple Vitamin (MULTIVITAMIN WITH MINERALS) TABS tablet Take 1 tablet by mouth daily.   NEEDLE, DISP, 18 G (B-D HYPODERMIC NEEDLE 18GX1.5") 18G X 1-1/2" MISC Hypodermic Needles 18 gauge x 1 1/2"  USE AS DIRECTED WITH TESTOSTERONE   pantoprazole (PROTONIX) 40 MG tablet Take 1 tablet (40 mg total) by mouth every morning.   predniSONE (DELTASONE) 5 MG tablet Take by mouth.   propranolol ER (INDERAL LA)  80 MG 24 hr capsule Take 1 capsule (80 mg total) by mouth every evening.   testosterone cypionate (DEPOTESTOSTERONE CYPIONATE) 200 MG/ML injection Inject 140 mg into the muscle every Wednesday.    traMADol (ULTRAM) 50 MG tablet Take 1 tablet (50 mg total) by mouth 3 (three) times daily as needed.   No facility-administered encounter medications on file as of 10/09/2023.    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   Rash 09/17/2019   Situational depression    "recently lost my mom and brother" (03/06/2018)   Wears contact lenses    Wears glasses    Past Surgical History:  Procedure Laterality Date   BACK SURGERY     COLONOSCOPY     COLONOSCOPY W/ BIOPSIES AND POLYPECTOMY     EXCISIONAL TOTAL SHOULDER ARTHROPLASTY WITH ANTIBIOTIC SPACER Left 03/06/2018   Procedure: Left shoulder antibiotic spacer expalnt with glenoid bone grafting, antiobiotic spacer placement;  Surgeon: Janeth Medicus, MD;  Location: [facility] OR;  Service: Orthopedics;  Laterality: Left;   EXCISIONAL TOTAL SHOULDER ARTHROPLASTY WITH ANTIBIOTIC SPACER Left 01/06/2020   Procedure: LEFT SHOULDER ARTHROPLASTY EXPLANT WITH ANTIBIOTIC SPACER PLACEMENT;  Surgeon: Janeth Medicus, MD;  Location: [facility] OR;  Service: Orthopedics;  Laterality: Left;  2.5 hrs RNFA   IR FLUORO GUIDE CV LINE RIGHT  12/26/2016   IRRIGATION AND DEBRIDEMENT SHOULDER Left 05/27/2020   Procedure: Left shoulder antibiotic spacer exchange;  Surgeon: Janeth Medicus, MD;  Location: [facility] OR;  Service: Orthopedics;  Laterality: Left;  120 mins   KNEE ARTHROSCOPY Bilateral    X 10 total arthroscopies   KNEE CARTILAGE SURGERY Left 1977   "cartilage removed"   LASIK Bilateral    LUMBAR DISC SURGERY  2000s   "ruptured disc"   REVERSE SHOULDER ARTHROPLASTY Left 12/08/2016   Procedure: Revision left total shoulder to reverse total shoulder arthroplasty;  Surgeon: Janeth Medicus, MD;  Location: [facility] OR;  Service:  Orthopedics;  Laterality: Left;   REVERSE SHOULDER ARTHROPLASTY Left 01/01/2018   Procedure: Left reverse shouler explant with antibiotic spacer placement;  Surgeon: Janeth Medicus, MD;  Location: [facility] ORS;  Service: Orthopedics;  Laterality: Left;  2.5 hrs   REVERSE SHOULDER ARTHROPLASTY Left 06/11/2018   Procedure: REVERSE TOTAL SHOULDER REVISION;  Surgeon: Janeth Medicus, MD;  Location: [facility] OR;  Service: Orthopedics;  Laterality: Left;   REVERSE SHOULDER ARTHROPLASTY Left 12/05/2018   Procedure: Left reverse shoulder explant, bone grafting;  Surgeon: Janeth Medicus, MD;  Location: [facility] OR;  Service: Orthopedics;  Laterality: Left;  2.5 hrs   REVISION OF TOTAL SHOULDER Left 12/26/2018   REVISION TOTAL SHOULDER TO REVERSE TOTAL SHOULDER Left 07/16/2018   Procedure: Left shoulder irrigation and debridement with poly and head ball exchange;  Surgeon: Janeth Medicus, MD;  Location[facility] OR;  Service: Orthopedics;  Laterality: Left;  2 hrs   SHOULDER ARTHROSCOPY Right 1980s/1990s   "bone spur removed"   SHOULDER OPEN ROTATOR CUFF REPAIR Right 1970s   SHOULDER SURGERY Left 03/06/2018   antibiotic spacer expalnt with glenoid bone grafting, antiobiotic spacer placement   TOTAL KNEE ARTHROPLASTY Bilateral    TOTAL SHOULDER ARTHROPLASTY Left ~ 2014-2017 X 2   "in [place]"   TOTAL SHOULDER REPLACEMENT Right ~ 2009   "in [place]"   TOTAL SHOULDER REVISION Left 12/26/2018   Procedure: Left prosthetic shoulder 1 component revision;  Surgeon: Yolonda Kida, MD;  Location: [facility] OR;  Service: Orthopedics;  Laterality: Left;  90 mins   WISDOM TOOTH EXTRACTION     Family History  Problem Relation Age of Onset   Arthritis Mother    Arthritis Sister    Alzheimer's disease Brother    Arthritis Brother    Social History   Socioeconomic History   Marital status: Single    Spouse name: Not on file   Number of children: Not on file   Years of education: Not on file   Highest  education level: Not on file  Occupational History   Not on file  Tobacco Use   Smoking status: Never   Smokeless tobacco: Never  Vaping Use   Vaping status: Never Used  Substance and Sexual Activity   Alcohol use: Not Currently   Drug use: Never   Sexual activity: Not Currently  Other Topics Concern   Not on file  Social History Narrative   Not on file   Social Drivers of Health   Financial Resource Strain: Low Risk  (10/09/2023)   Overall Financial Resource Strain (CARDIA)    Difficulty of Paying Living Expenses: Not hard at all  Food Insecurity: No Food Insecurity (10/09/2023)   Hunger Vital Sign    Worried About Running Out of Food in the Last Year: Never true    Ran Out of Food in the Last Year: Never true  Transportation Needs: No Transportation Needs (10/09/2023)   PRAPARE - Administrator, Civil Service (Medical): No    Lack of Transportation (Non-Medical): No  Physical Activity: Sufficiently Active (10/09/2023)   Exercise Vital Sign    Days of Exercise per Week: 5 days    Minutes of Exercise per Session: 90 min  Stress: No Stress Concern Present (10/09/2023)   [employer] of Occupational Health - Occupational Stress Questionnaire    Feeling of Stress : Not at all  Social Connections: Moderately Isolated (10/09/2023)   Social Connection and Isolation Panel [NHANES]    Frequency of Communication with Friends and Family: More than three times a week    Frequency of Social Gatherings with Friends and Family: More than three times a week    Attends Religious Services: More than 4 times per year    Active Member of [employer] or Organizations: No    Attends Engineer, structural: Never    Marital Status: Never married    Tobacco Counseling Counseling given: Not Answered    Clinical Intake:  Pre-visit preparation completed: Yes  Pain : No/denies pain     BMI - recorded: 28.87 Nutritional Status: BMI 25 -29 Overweight Nutritional Risks:  None Diabetes: No  Lab Results  Component Value Date   HGBA1C 5.5 09/04/2022     How often do you need to have someone help you when you read instructions, pamphlets, or other written materials from your doctor or pharmacy?: 1 - Never  Interpreter Needed?: No  Information entered by :: Lamont Pilsner, LPN   Activities of Daily Living     10/09/2023    8:44 AM  In your present state of health, do you have any difficulty performing the following activities:  Hearing? 0  Vision? 0  Difficulty concentrating or making decisions? 0  Walking or climbing stairs? 0  Dressing or bathing? 0  Doing errands, shopping? 0  Preparing Food and eating ? N  Using the Toilet? N  In the past six months, have you accidently leaked urine? N  Do you have problems with loss of bowel control? N  Managing your Medications? N  Managing your Finances? N  Housekeeping or managing your Housekeeping? N    Patient Care Team: Rodney Clamp, MD as PCP - General (Family Medicine) Janeth Medicus, MD as Consulting Physician (Orthopedic Surgery)  Indicate any recent Medical Services you may have received from other than [facility] providers in the past year (date may be approximate).     Assessment:   This is a routine wellness examination for [facility].  Hearing/Vision screen Hearing Screening - Comments:: Pt denies any hearing issues  Vision Screening - Comments:: Wears rx glasses - up to date with routine eye exams with Dr Princella [place]     Goals Addressed             This Visit's Progress    Patient Stated       Maintain health and activity       Depression Screen     10/09/2023    8:47 AM 09/13/2023    9:20 AM 01/31/2023    3:58 PM 10/03/2022    8:39 AM 09/04/2022   12:57 PM 03/21/2022    9:03 AM 01/09/2020   10:39 AM  PHQ 2/9 Scores  PHQ - 2 Score 0 0 0 0 0 0 0  PHQ- 9 Score   1        Fall Risk     10/09/2023    8:48 AM 09/13/2023    9:21 AM 01/31/2023    3:58 PM 10/02/2022   11:18  AM 09/04/2022   12:57 PM  Fall Risk   Falls in the past year? 0 0 0 0 0  Number falls in past yr: 0 0 0 0 0  Injury with Fall? 0 0 0 0 0  Risk for fall due to : No Fall Risks No Fall Risks No Fall Risks Impaired vision No Fall Risks  Follow up Falls prevention discussed  Falls evaluation completed Falls prevention discussed     MEDICARE RISK AT HOME:  Medicare Risk at Home Any stairs in or around the home?: Yes If so, are there any without handrails?: No Home free of loose throw rugs in walkways, pet beds, electrical cords, etc?: Yes Adequate lighting in your home to reduce risk of falls?: Yes Life alert?: No Use of a cane, walker or w/c?: No Grab bars in the bathroom?: No Shower chair or bench in shower?: No Elevated toilet seat or a handicapped toilet?: No  TIMED UP AND GO:  Was the test performed?  No  Cognitive Function: 6CIT completed        10/09/2023    8:49 AM  6CIT Screen  What Year? 0 points  What month? 0 points  What time? 0 points  Count back from 20 0 points  Months in reverse 0 points  Repeat phrase 0 points  Total Score 0 points    Immunizations Immunization History  Administered Date(s) Administered   Influenza Inj Mdck Quad Pf 03/25/2018   Influenza,inj,Quad PF,6+ Mos 02/13/2017   Tdap 05/18/2012    Screening Tests Health Maintenance  Topic Date Due   COVID-19 Vaccine (1) Never done   Zoster Vaccines- Shingrix (1 of 2) 12/14/2023 (Originally 08/20/1974)   DTaP/Tdap/Td (2 - Td or Tdap) 09/12/2024 (Originally 05/18/2022)   Pneumonia Vaccine 28+ Years old (1 of 1 - PCV) 09/12/2024 (Originally 08/19/2006)   Hepatitis C Screening  09/12/2024 (Originally 08/19/1973)   INFLUENZA VACCINE  01/25/2024   Medicare Annual Wellness (AWV)  10/08/2024   Colonoscopy  07/07/2026   HPV VACCINES  Aged Out   Meningococcal B Vaccine  Aged Out    Health Maintenance  Health Maintenance Due  Topic Date Due   COVID-19 Vaccine (1) Never done   Health  Maintenance Items Addressed: See Nurse Notes  Additional Screening:  Vision Screening: Recommended annual ophthalmology exams for early detection of glaucoma and other disorders of the eye.  Dental Screening: Recommended annual dental exams for proper oral hygiene  Community Resource Referral / Chronic Care Management: CRR required this visit?  No   CCM required this visit?  No     Plan:     I have personally reviewed and noted the following in the patient's chart:   Medical and social history Use of alcohol, tobacco or illicit drugs  Current medications and supplements including opioid prescriptions. Patient is currently taking opioid prescriptions. Information provided to patient regarding non-opioid alternatives. Patient advised to discuss non-opioid treatment plan with their provider. Functional ability and status Nutritional status Physical activity Advanced directives List of other physicians Hospitalizations, surgeries, and ER visits in previous 12 months Vitals Screenings to include cognitive, depression, and falls Referrals and appointments  In addition, I have reviewed and discussed with patient certain preventive protocols, quality metrics, and best practice recommendations. A written personalized care plan for preventive services as well as general preventive health recommendations were provided to patient.     Bruno Capri, LPN   1/61/0960   After Visit Summary: (MyChart) Due to this being a telephonic visit, the after visit summary with patients personalized plan was offered to patient via MyChart   Notes:  Pt request That his medication be put on a 3 moth rotation for ordering purpose. He has an appt  in May to discuss

## 2023-10-09 NOTE — Patient Instructions (Addendum)
 Mr. Vue , Thank you for taking time to come for your Medicare Wellness Visit. I appreciate your ongoing commitment to your health goals. Please review the following plan we discussed and let me know if I can assist you in the future.   Referrals/Orders/Follow-Ups/Clinician Recommendations: Maintain health and activity   This is a list of the screening recommended for you and due dates:  Health Maintenance  Topic Date Due   COVID-19 Vaccine (1) Never done   Medicare Annual Wellness Visit  10/03/2023   Zoster (Shingles) Vaccine (1 of 2) 12/14/2023*   DTaP/Tdap/Td vaccine (2 - Td or Tdap) 09/12/2024*   Pneumonia Vaccine (1 of 1 - PCV) 09/12/2024*   Hepatitis C Screening  09/12/2024*   Flu Shot  01/25/2024   Colon Cancer Screening  07/07/2026   HPV Vaccine  Aged Out   Meningitis B Vaccine  Aged Out  *Topic was postponed. The date shown is not the original due date.    Advanced directives: (Copy Requested) Please bring a copy of your health care power of attorney and living will to the office to be added to your chart at your convenience. You can mail to [facility] [address] [place], [place] [address] or email to [email or web address]  Next Medicare Annual Wellness Visit scheduled for next year: Yes i

## 2023-10-15 DIAGNOSIS — M109 Gout, unspecified: Secondary | ICD-10-CM | POA: Diagnosis not present

## 2023-10-15 DIAGNOSIS — R5383 Other fatigue: Secondary | ICD-10-CM | POA: Diagnosis not present

## 2023-10-15 DIAGNOSIS — M353 Polymyalgia rheumatica: Secondary | ICD-10-CM | POA: Diagnosis not present

## 2023-10-15 DIAGNOSIS — M2560 Stiffness of unspecified joint, not elsewhere classified: Secondary | ICD-10-CM | POA: Diagnosis not present

## 2023-11-05 DIAGNOSIS — R972 Elevated prostate specific antigen [PSA]: Secondary | ICD-10-CM | POA: Diagnosis not present

## 2023-11-05 DIAGNOSIS — E039 Hypothyroidism, unspecified: Secondary | ICD-10-CM | POA: Diagnosis not present

## 2023-11-05 DIAGNOSIS — E291 Testicular hypofunction: Secondary | ICD-10-CM | POA: Diagnosis not present

## 2023-11-06 ENCOUNTER — Ambulatory Visit (INDEPENDENT_AMBULATORY_CARE_PROVIDER_SITE_OTHER): Payer: Medicare Other | Admitting: Family Medicine

## 2023-11-06 ENCOUNTER — Encounter: Payer: Self-pay | Admitting: Family Medicine

## 2023-11-06 VITALS — BP 138/83 | HR 72 | Temp 98.2°F | Ht 71.0 in | Wt 210.6 lb

## 2023-11-06 DIAGNOSIS — M1000 Idiopathic gout, unspecified site: Secondary | ICD-10-CM

## 2023-11-06 DIAGNOSIS — Z0001 Encounter for general adult medical examination with abnormal findings: Secondary | ICD-10-CM | POA: Diagnosis not present

## 2023-11-06 DIAGNOSIS — E785 Hyperlipidemia, unspecified: Secondary | ICD-10-CM | POA: Diagnosis not present

## 2023-11-06 DIAGNOSIS — M2550 Pain in unspecified joint: Secondary | ICD-10-CM

## 2023-11-06 DIAGNOSIS — I10 Essential (primary) hypertension: Secondary | ICD-10-CM

## 2023-11-06 DIAGNOSIS — R7989 Other specified abnormal findings of blood chemistry: Secondary | ICD-10-CM

## 2023-11-06 DIAGNOSIS — Z131 Encounter for screening for diabetes mellitus: Secondary | ICD-10-CM

## 2023-11-06 DIAGNOSIS — E039 Hypothyroidism, unspecified: Secondary | ICD-10-CM

## 2023-11-06 DIAGNOSIS — K219 Gastro-esophageal reflux disease without esophagitis: Secondary | ICD-10-CM

## 2023-11-06 LAB — LIPID PANEL
Cholesterol: 231 mg/dL — ABNORMAL HIGH (ref 0–200)
HDL: 47.7 mg/dL (ref >39.00)
LDL Cholesterol: 134 mg/dL — ABNORMAL HIGH (ref 0–99)
NonHDL: 183.09
Total CHOL/HDL Ratio: 5
Triglycerides: 246 mg/dL — ABNORMAL HIGH (ref 0.0–149.0)
VLDL: 49.2 mg/dL — ABNORMAL HIGH (ref 0.0–40.0)

## 2023-11-06 LAB — HEMOGLOBIN A1C: Hgb A1c MFr Bld: 5.8 % (ref 4.6–6.5)

## 2023-11-06 MED ORDER — AMLODIPINE BESYLATE 10 MG PO TABS: 10.0000 mg | ORAL_TABLET | Freq: Every day | ORAL | 3 refills | Status: DC

## 2023-11-06 NOTE — Assessment & Plan Note (Signed)
 [facility] with [facility].  On testosterone  replacement.  He believes he had PSA checked yesterday though we will verify with his endocrinologist that his visit with him tomorrow.

## 2023-11-06 NOTE — Assessment & Plan Note (Signed)
 Check lipids. Discussed lifestyle modifications.

## 2023-11-06 NOTE — Assessment & Plan Note (Signed)
 Stable on Protonix 40 mg daily.

## 2023-11-06 NOTE — Assessment & Plan Note (Addendum)
 At goal today on amlodipine  10 mg daily and propranolol  80 mg daily.Geoffrey West  He will monitor at home and let us  know if persistently elevated.  Check labs.  He is doing a great job with lifestyle interventions.

## 2023-11-06 NOTE — Progress Notes (Signed)
 Chief Complaint:  Geoffrey West is a 68 y.o. male who presents today for his annual comprehensive physical exam.    Assessment/Plan:  Chronic Problems Addressed Today: Hypertension At goal today on amlodipine  10 mg daily and propranolol  80 mg daily.Geoffrey West  He will monitor at home and let us  know if persistently elevated.  Check labs.  He is doing a great job with lifestyle interventions.  Low testosterone  Along with [facility].  On testosterone  replacement.  He believes he had PSA checked yesterday though we will verify with his endocrinologist that his visit with him tomorrow.  Gout Doing well without any recent flares on allopurinol  300 mg daily.  Now following with rheumatology for his polyarthralgia.  Polyarthralgia Has established with rheumatology since her last visit.  His labs have been negative.  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  Symptoms have improved somewhat.  Will defer further management to rheumatology.  Hypothyroidism On Armour thyroid  30 mg daily.  Following with [facility] endocrinology.  Dyslipidemia Check lipids.  Discussed lifestyle modifications.  GERD (gastroesophageal reflux disease) Stable on Protonix  40 mg daily.  Preventative Healthcare: Check labs.  Declined vaccines.  Up-to-date on colon cancer screening.  Patient Counseling(The following topics were reviewed and/or handout was given):  -Nutrition: Stressed importance of moderation in sodium/caffeine intake, saturated fat and cholesterol, caloric balance, sufficient intake of fresh fruits, vegetables, and fiber.  -Stressed the importance of regular exercise.   -Substance Abuse: Discussed cessation/primary prevention of tobacco, alcohol , or other drug use; driving or other dangerous activities under the influence; availability of treatment for abuse.   -Injury prevention: Discussed safety belts, safety helmets, smoke detector, smoking near bedding or upholstery.    -Sexuality: Discussed sexually transmitted diseases, partner selection, use of condoms, avoidance of unintended pregnancy and contraceptive alternatives.   -Dental health: Discussed importance of regular tooth brushing, flossing, and dental visits.  -Health maintenance and immunizations reviewed. Please refer to Health maintenance section.  Return to care in 1 year for next preventative visit.     Subjective:  HPI:  He has no acute complaints today.  He is here today for his annual physical. I last saw him a couple of months ago. At that time he was concerned about polyarthralgia and we had him follow up with rheumatology. Since our last visit he has seen them and was started on prednisone  for possible PMR.  He feels like this has helped modestly with his symptoms.  Lifestyle Diet: Plenty of fruits and vegetables.  Exercise: Weight training and cardio routinely      11/06/2023    9:47 AM  Depression screen PHQ 2/9  Decreased Interest 0  Down, Depressed, Hopeless 0  PHQ - 2 Score 0    There are no preventive care reminders to display for this patient.   ROS: Per HPI, otherwise a complete review of systems was negative.   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   Rash 09/17/2019   Situational depression    "recently lost my mom and brother" (03/06/2018)   Wears contact lenses    Wears glasses    Patient  Active Problem List   Diagnosis Date Noted   Dyslipidemia 09/05/2022   Melanoma in situ (HCC) 09/04/2022   Polyarthralgia 03/21/2022   Hypothyroidism 03/21/2022   Seasonal allergies 03/21/2022   GERD (gastroesophageal reflux disease) 03/21/2022   Anxiety 12/09/2019   Hypertension 12/25/2016   Low testosterone  12/25/2016   Gout 12/25/2016   Past Surgical History:  Procedure Laterality Date   BACK SURGERY     COLONOSCOPY     COLONOSCOPY W/ BIOPSIES AND POLYPECTOMY     EXCISIONAL TOTAL SHOULDER ARTHROPLASTY WITH ANTIBIOTIC SPACER Left 03/06/2018   Procedure: Left shoulder antibiotic spacer expalnt with glenoid bone grafting, antiobiotic spacer placement;  Surgeon: Janeth Medicus, MD;  Location: [facility] OR;  Service: Orthopedics;  Laterality: Left;   EXCISIONAL TOTAL SHOULDER ARTHROPLASTY WITH ANTIBIOTIC SPACER Left 01/06/2020   Procedure: LEFT SHOULDER ARTHROPLASTY EXPLANT WITH ANTIBIOTIC SPACER PLACEMENT;  Surgeon: Janeth Medicus, MD;  Location: [facility] OR;  Service: Orthopedics;  Laterality: Left;  2.5 hrs RNFA   IR FLUORO GUIDE CV LINE RIGHT  12/26/2016   IRRIGATION AND DEBRIDEMENT SHOULDER Left 05/27/2020   Procedure: Left shoulder antibiotic spacer exchange;  Surgeon: Janeth Medicus, MD;  Location: [facility] OR;  Service: Orthopedics;  Laterality: Left;  120 mins   KNEE ARTHROSCOPY Bilateral    X 10 total arthroscopies   KNEE CARTILAGE SURGERY Left 1977   "cartilage removed"   LASIK Bilateral    LUMBAR DISC SURGERY  2000s   "ruptured disc"   REVERSE SHOULDER ARTHROPLASTY Left 12/08/2016   Procedure: Revision left total shoulder to reverse total shoulder arthroplasty;  Surgeon: Janeth Medicus, MD;  Location: [facility] OR;  Service: Orthopedics;  Laterality: Left;   REVERSE SHOULDER ARTHROPLASTY Left 01/01/2018   Procedure: Left reverse shouler explant with antibiotic spacer placement;  Surgeon: Janeth Medicus, MD;  Location: [facility] ORS;  Service: Orthopedics;  Laterality: Left;   2.5 hrs   REVERSE SHOULDER ARTHROPLASTY Left 06/11/2018   Procedure: REVERSE TOTAL SHOULDER REVISION;  Surgeon: Janeth Medicus, MD;  Location: [facility] OR;  Service: Orthopedics;  Laterality: Left;   REVERSE SHOULDER ARTHROPLASTY Left 12/05/2018   Procedure: Left reverse shoulder explant, bone grafting;  Surgeon: Janeth Medicus, MD;  Location: [facility] OR;  Service: Orthopedics;  Laterality: Left;  2.5 hrs   REVISION OF TOTAL SHOULDER Left 12/26/2018   REVISION TOTAL SHOULDER TO REVERSE TOTAL SHOULDER Left 07/16/2018   Procedure: Left shoulder irrigation and debridement with poly and head ball exchange;  Surgeon: Janeth Medicus, MD;  Location: [facility] OR;  Service: Orthopedics;  Laterality: Left;  2 hrs   SHOULDER ARTHROSCOPY Right 1980s/1990s   "bone spur removed"   SHOULDER OPEN ROTATOR CUFF REPAIR Right 1970s   SHOULDER SURGERY Left 03/06/2018   antibiotic spacer expalnt with glenoid bone grafting, antiobiotic spacer placement   TOTAL KNEE ARTHROPLASTY Bilateral    TOTAL SHOULDER ARTHROPLASTY Left ~ 2014-2017 X 2   "in [place]"   TOTAL SHOULDER REPLACEMENT Right ~ 2009   "in [place]"   TOTAL SHOULDER REVISION Left 12/26/2018   Procedure: Left prosthetic shoulder 1 component revision;  Surgeon: Janeth Medicus, MD;  Location: [facility] OR;  Service: Orthopedics;  Laterality: Left;  90  mins   WISDOM TOOTH EXTRACTION      Family History  Problem Relation Age of Onset   Arthritis Mother    Arthritis Sister    Alzheimer's disease Brother    Arthritis Brother     Medications- reviewed and updated Current Outpatient Medications  Medication Sig Dispense Refill   allopurinol  (ZYLOPRIM ) 300 MG tablet Take 1 tablet (300 mg total) by mouth daily. 90 tablet 3   ARMOUR THYROID  60 MG tablet 1 tablet on an empty stomach Orally Once a day for 90 days     celecoxib  (CELEBREX ) 200 MG capsule Take 1 capsule (200 mg total) by mouth 2 (two) times daily. 60 capsule 0   clonazePAM  (KLONOPIN ) 1 MG  tablet Take 1 tablet (1 mg total) by mouth daily as needed for anxiety. 10 tablet 0   Coenzyme Q10 (CO Q-10) 300 MG CAPS Take 300 mg by mouth daily.     diphenhydramine -acetaminophen  (TYLENOL  PM) 25-500 MG TABS tablet Take 1 tablet by mouth at bedtime as needed (sleep).     fluticasone  (FLONASE ) 50 MCG/ACT nasal spray Place 2 sprays into both nostrils daily as needed for allergies or rhinitis. 11.1 mL 1   Melatonin 10 MG TABS Take by mouth.     Multiple Vitamin (MULTIVITAMIN WITH MINERALS) TABS tablet Take 1 tablet by mouth daily.     NEEDLE, DISP, 18 G (B-D HYPODERMIC NEEDLE 18GX1.5") 18G X 1-1/2" MISC Hypodermic Needles 18 gauge x 1 1/2"  USE AS DIRECTED WITH TESTOSTERONE      pantoprazole  (PROTONIX ) 40 MG tablet Take 1 tablet (40 mg total) by mouth every morning. 90 tablet 3   predniSONE  (DELTASONE ) 5 MG tablet Take by mouth.     propranolol  ER (INDERAL  LA) 80 MG 24 hr capsule Take 1 capsule (80 mg total) by mouth every evening. 90 capsule 1   testosterone  cypionate (DEPOTESTOSTERONE CYPIONATE) 200 MG/ML injection Inject 140 mg into the muscle every Wednesday.   5   traMADol  (ULTRAM ) 50 MG tablet Take 1 tablet (50 mg total) by mouth 3 (three) times daily as needed. 30 tablet 5   amLODipine  (NORVASC ) 10 MG tablet Take 1 tablet (10 mg total) by mouth at bedtime. 90 tablet 3   No current facility-administered medications for this visit.    Allergies-reviewed and updated Allergies  Allergen Reactions   Fentanyl  Shortness Of Breath and Other (See Comments)    Reaction to [facility] ONLY > ? DOSE REGULATION ?    Gabapentin  Other (See Comments)   Oxycodone Other (See Comments)    Pt states he goes through hot and cold flashes withdrawal like symptoms    Social History   Socioeconomic History   Marital status: Single    Spouse name: Not on file   Number of children: Not on file   Years of education: Not on file   Highest education level: Master's degree (e.g., MA, MS, MEng, MEd, MSW, MBA)   Occupational History   Not on file  Tobacco Use   Smoking status: Never   Smokeless tobacco: Never  Vaping Use   Vaping status: Never Used  Substance and Sexual Activity   Alcohol  use: Not Currently   Drug use: Never   Sexual activity: Not Currently  Other Topics Concern   Not on file  Social History Narrative   Not on file   Social Drivers of Health   Financial Resource Strain: Low Risk  (11/04/2023)   Overall Financial Resource Strain (CARDIA)    Difficulty of  Paying Living Expenses: Not hard at all  Food Insecurity: No Food Insecurity (11/04/2023)   Hunger Vital Sign    Worried About Running Out of Food in the Last Year: Never true    Ran Out of Food in the Last Year: Never true  Transportation Needs: No Transportation Needs (11/04/2023)   PRAPARE - Administrator, Civil Service (Medical): No    Lack of Transportation (Non-Medical): No  Physical Activity: Sufficiently Active (11/04/2023)   Exercise Vital Sign    Days of Exercise per Week: 4 days    Minutes of Exercise per Session: 130 min  Stress: No Stress Concern Present (11/04/2023)   [employer] of Occupational Health - Occupational Stress Questionnaire    Feeling of Stress : Not at all  Social Connections: Moderately Isolated (11/04/2023)   Social Connection and Isolation Panel [NHANES]    Frequency of Communication with Friends and Family: More than three times a week    Frequency of Social Gatherings with Friends and Family: More than three times a week    Attends Religious Services: More than 4 times per year    Active Member of [employer] or Organizations: No    Attends Engineer, structural: Never    Marital Status: Never married        Objective:  Physical Exam: BP 138/83   Pulse 72   Temp 98.2 F (36.8 C) (Temporal)   Ht 5\' 11"  (1.803 m)   Wt 210 lb 9.6 oz (95.5 kg)   SpO2 97%   BMI 29.37 kg/m   Body mass index is 29.37 kg/m. Wt Readings from Last 3 Encounters:  11/06/23 210  lb 9.6 oz (95.5 kg)  10/09/23 207 lb (93.9 kg)  09/13/23 215 lb 12.8 oz (97.9 kg)   Gen: NAD, resting comfortably HEENT: TMs normal bilaterally. OP clear. No thyromegaly noted.  CV: RRR with no murmurs appreciated Pulm: NWOB, CTAB with no crackles, wheezes, or rhonchi GI: Normal bowel sounds present. Soft, Nontender, Nondistended. MSK: no edema, cyanosis, or clubbing noted Skin: warm, dry Neuro: CN2-12 grossly intact. Strength 5/5 in upper and lower extremities. Reflexes symmetric and intact bilaterally.  Psych: Normal affect and thought content     Kippy Melena M. Daneil Dunker, MD 11/06/2023 10:35 AM

## 2023-11-06 NOTE — Assessment & Plan Note (Signed)
 Doing well without any recent flares on allopurinol  300 mg daily.  Now following with rheumatology for his polyarthralgia.

## 2023-11-06 NOTE — Assessment & Plan Note (Signed)
 On Armour thyroid  30 mg daily.  Following with [facility] endocrinology.

## 2023-11-06 NOTE — Assessment & Plan Note (Signed)
 Has established with rheumatology since her last visit.  His labs have been negative.  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  Symptoms have improved somewhat.  Will defer further management to rheumatology.

## 2023-11-06 NOTE — Patient Instructions (Signed)
 It was very nice to see you today!  We will check blood work today.  Please continue to work on diet and exercise.  Let know if you change your mind about the vaccines.  Please make sure that your endocrinologist is checking your PSA.  I will see you back in year for your next physical.  Come back sooner if needed.  Return in about 1 year (around 11/05/2024) for Annual Physical.   Take care, Dr Daneil Dunker  PLEASE NOTE:  If you had any lab tests, please let us  know if you have not heard back within a few days. You may see your results on mychart before we have a chance to review them but we will give you a call once they are reviewed by us .   If we ordered any referrals today, please let us  know if you have not heard from their office within the next week.   If you had any urgent prescriptions sent in today, please check with the pharmacy within an hour of our visit to make sure the prescription was transmitted appropriately.   Please try these tips to maintain a healthy lifestyle:  Eat at least 3 REAL meals and 1-2 snacks per day.  Aim for no more than 5 hours between eating.  If you eat breakfast, please do so within one hour of getting up.   Each meal should contain half fruits/vegetables, one quarter protein, and one quarter carbs (no bigger than a computer mouse)  Cut down on sweet beverages. This includes juice, soda, and sweet tea.   Drink at least 1 glass of water with each meal and aim for at least 8 glasses per day  Exercise at least 150 minutes every week.    Preventive Care 17 Years and Older, Male Preventive care refers to lifestyle choices and visits with your health care provider that can promote health and wellness. Preventive care visits are also called wellness exams. What can I expect for my preventive care visit? Counseling During your preventive care visit, your health care provider may ask about your: Medical history, including: Past medical  problems. Family medical history. History of falls. Current health, including: Emotional well-being. Home life and relationship well-being. Sexual activity. Memory and ability to understand (cognition). Lifestyle, including: Alcohol , nicotine or tobacco, and drug use. Access to firearms. Diet, exercise, and sleep habits. Work and work Astronomer. Sunscreen use. Safety issues such as seatbelt and bike helmet use. Physical exam Your health care provider will check your: Height and weight. These may be used to calculate your BMI (body mass index). BMI is a measurement that tells if you are at a healthy weight. Waist circumference. This measures the distance around your waistline. This measurement also tells if you are at a healthy weight and may help predict your risk of certain diseases, such as type 2 diabetes and high blood pressure. Heart rate and blood pressure. Body temperature. Skin for abnormal spots. What immunizations do I need?  Vaccines are usually given at various ages, according to a schedule. Your health care provider will recommend vaccines for you based on your age, medical history, and lifestyle or other factors, such as travel or where you work. What tests do I need? Screening Your health care provider may recommend screening tests for certain conditions. This may include: Lipid and cholesterol levels. Diabetes screening. This is done by checking your blood sugar (glucose) after you have not eaten for a while (fasting). Hepatitis C test. Hepatitis B test. HIV (  human immunodeficiency virus) test. STI (sexually transmitted infection) testing, if you are at risk. Lung cancer screening. Colorectal cancer screening. Prostate cancer screening. Abdominal aortic aneurysm (AAA) screening. You may need this if you are a current or former smoker. Talk with your health care provider about your test results, treatment options, and if necessary, the need for more  tests. Follow these instructions at home: Eating and drinking  Eat a diet that includes fresh fruits and vegetables, whole grains, lean protein, and low-fat dairy products. Limit your intake of foods with high amounts of sugar, saturated fats, and salt. Take vitamin and mineral supplements as recommended by your health care provider. Do not drink alcohol  if your health care provider tells you not to drink. If you drink alcohol : Limit how much you have to 0-2 drinks a day. Know how much alcohol  is in your drink. In the [place] one drink equals one 12 oz bottle of beer (355 mL), one 5 oz glass of wine (148 mL), or one 1 oz glass of hard liquor (44 mL). Lifestyle Brush your teeth every morning and night with fluoride toothpaste. Floss one time each day. Exercise for at least 30 minutes 5 or more days each week. Do not use any products that contain nicotine or tobacco. These products include cigarettes, chewing tobacco, and vaping devices, such as e-cigarettes. If you need help quitting, ask your health care provider. Do not use drugs. If you are sexually active, practice safe sex. Use a condom or other form of protection to prevent STIs. Take aspirin  only as told by your health care provider. Make sure that you understand how much to take and what form to take. Work with your health care provider to find out whether it is safe and beneficial for you to take aspirin  daily. Ask your health care provider if you need to take a cholesterol-lowering medicine (statin). Find healthy ways to manage stress, such as: Meditation, yoga, or listening to music. Journaling. Talking to a trusted person. Spending time with friends and family. Safety Always wear your seat belt while driving or riding in a vehicle. Do not drive: If you have been drinking alcohol . Do not ride with someone who has been drinking. When you are tired or distracted. While texting. If you have been using any mind-altering substances  or drugs. Wear a helmet and other protective equipment during sports activities. If you have firearms in your house, make sure you follow all gun safety procedures. Minimize exposure to UV radiation to reduce your risk of skin cancer. What's next? Visit your health care provider once a year for an annual wellness visit. Ask your health care provider how often you should have your eyes and teeth checked. Stay up to date on all vaccines. This information is not intended to replace advice given to you by your health care provider. Make sure you discuss any questions you have with your health care provider. Document Revised: 12/08/2020 Document Reviewed: 12/08/2020 Elsevier Patient Education  2024 [employer].

## 2023-11-07 DIAGNOSIS — M2550 Pain in unspecified joint: Secondary | ICD-10-CM | POA: Diagnosis not present

## 2023-11-07 DIAGNOSIS — E291 Testicular hypofunction: Secondary | ICD-10-CM | POA: Diagnosis not present

## 2023-11-07 DIAGNOSIS — E039 Hypothyroidism, unspecified: Secondary | ICD-10-CM | POA: Diagnosis not present

## 2023-11-07 DIAGNOSIS — Z6830 Body mass index (BMI) 30.0-30.9, adult: Secondary | ICD-10-CM | POA: Diagnosis not present

## 2023-11-08 ENCOUNTER — Other Ambulatory Visit: Payer: Self-pay

## 2023-11-08 ENCOUNTER — Ambulatory Visit: Payer: Self-pay | Admitting: Family Medicine

## 2023-11-08 DIAGNOSIS — E785 Hyperlipidemia, unspecified: Secondary | ICD-10-CM

## 2023-11-08 MED ORDER — ATORVASTATIN CALCIUM 40 MG PO TABS: 40.0000 mg | ORAL_TABLET | Freq: Every day | ORAL | 3 refills | Status: DC

## 2023-11-08 NOTE — Progress Notes (Signed)
 His cholesterol is elevated.  It is higher than last year.  He would benefit from starting cholesterol medication to improve his numbers and lower risk of heart attack and stroke.  Please send in Lipitor 40 mg daily if he is agreeable to start.  His blood sugar is borderline elevated.  Do not need to start meds for this but he should continue to work on diet and exercise and we can recheck in a year.

## 2023-11-14 DIAGNOSIS — K080 Exfoliation of teeth due to systemic causes: Secondary | ICD-10-CM | POA: Diagnosis not present

## 2023-11-15 DIAGNOSIS — M353 Polymyalgia rheumatica: Secondary | ICD-10-CM | POA: Diagnosis not present

## 2023-11-15 DIAGNOSIS — M109 Gout, unspecified: Secondary | ICD-10-CM | POA: Diagnosis not present

## 2023-11-15 DIAGNOSIS — M0609 Rheumatoid arthritis without rheumatoid factor, multiple sites: Secondary | ICD-10-CM | POA: Diagnosis not present

## 2023-11-15 DIAGNOSIS — R5383 Other fatigue: Secondary | ICD-10-CM | POA: Diagnosis not present

## 2023-11-29 DIAGNOSIS — M150 Primary generalized (osteo)arthritis: Secondary | ICD-10-CM | POA: Diagnosis not present

## 2023-11-29 DIAGNOSIS — R5383 Other fatigue: Secondary | ICD-10-CM | POA: Diagnosis not present

## 2023-11-29 DIAGNOSIS — M353 Polymyalgia rheumatica: Secondary | ICD-10-CM | POA: Diagnosis not present

## 2023-11-29 DIAGNOSIS — M2540 Effusion, unspecified joint: Secondary | ICD-10-CM | POA: Diagnosis not present

## 2023-12-06 ENCOUNTER — Other Ambulatory Visit: Payer: Self-pay | Admitting: Family Medicine

## 2023-12-10 DIAGNOSIS — M2560 Stiffness of unspecified joint, not elsewhere classified: Secondary | ICD-10-CM | POA: Diagnosis not present

## 2024-01-08 DIAGNOSIS — M0609 Rheumatoid arthritis without rheumatoid factor, multiple sites: Secondary | ICD-10-CM | POA: Diagnosis not present

## 2024-01-08 DIAGNOSIS — M353 Polymyalgia rheumatica: Secondary | ICD-10-CM | POA: Diagnosis not present

## 2024-01-08 DIAGNOSIS — M2540 Effusion, unspecified joint: Secondary | ICD-10-CM | POA: Diagnosis not present

## 2024-01-08 DIAGNOSIS — M2560 Stiffness of unspecified joint, not elsewhere classified: Secondary | ICD-10-CM | POA: Diagnosis not present

## 2024-01-10 ENCOUNTER — Other Ambulatory Visit: Payer: Self-pay | Admitting: Family Medicine

## 2024-01-14 DIAGNOSIS — M9903 Segmental and somatic dysfunction of lumbar region: Secondary | ICD-10-CM | POA: Diagnosis not present

## 2024-01-14 DIAGNOSIS — M4726 Other spondylosis with radiculopathy, lumbar region: Secondary | ICD-10-CM | POA: Diagnosis not present

## 2024-01-14 DIAGNOSIS — M51362 Other intervertebral disc degeneration, lumbar region with discogenic back pain and lower extremity pain: Secondary | ICD-10-CM | POA: Diagnosis not present

## 2024-01-14 DIAGNOSIS — M9906 Segmental and somatic dysfunction of lower extremity: Secondary | ICD-10-CM | POA: Diagnosis not present

## 2024-01-16 DIAGNOSIS — M51362 Other intervertebral disc degeneration, lumbar region with discogenic back pain and lower extremity pain: Secondary | ICD-10-CM | POA: Diagnosis not present

## 2024-01-16 DIAGNOSIS — M9906 Segmental and somatic dysfunction of lower extremity: Secondary | ICD-10-CM | POA: Diagnosis not present

## 2024-01-16 DIAGNOSIS — M4726 Other spondylosis with radiculopathy, lumbar region: Secondary | ICD-10-CM | POA: Diagnosis not present

## 2024-01-16 DIAGNOSIS — M9903 Segmental and somatic dysfunction of lumbar region: Secondary | ICD-10-CM | POA: Diagnosis not present

## 2024-01-21 DIAGNOSIS — M51362 Other intervertebral disc degeneration, lumbar region with discogenic back pain and lower extremity pain: Secondary | ICD-10-CM | POA: Diagnosis not present

## 2024-01-21 DIAGNOSIS — M4726 Other spondylosis with radiculopathy, lumbar region: Secondary | ICD-10-CM | POA: Diagnosis not present

## 2024-01-21 DIAGNOSIS — M9903 Segmental and somatic dysfunction of lumbar region: Secondary | ICD-10-CM | POA: Diagnosis not present

## 2024-01-21 DIAGNOSIS — M9906 Segmental and somatic dysfunction of lower extremity: Secondary | ICD-10-CM | POA: Diagnosis not present

## 2024-01-23 ENCOUNTER — Ambulatory Visit (INDEPENDENT_AMBULATORY_CARE_PROVIDER_SITE_OTHER): Admitting: Family

## 2024-01-23 VITALS — BP 126/72 | Temp 98.4°F | Ht 71.0 in | Wt 220.0 lb

## 2024-01-23 DIAGNOSIS — L989 Disorder of the skin and subcutaneous tissue, unspecified: Secondary | ICD-10-CM

## 2024-01-23 NOTE — Progress Notes (Signed)
 Patient ID: Geoffrey West, male    DOB: [birth year], 68 y.o.   MRN: [id number]  Chief Complaint  Patient presents with   Hair/Scalp Problem    Pt c/o scalp problem, Present for about a month. Has tried washing and neosporin.   Discussed the use of AI scribe software for clinical note transcription with the patient, who gave verbal consent to proceed.  History of Present Illness Geoffrey West is a 68 year old male who presents with a non-healing scalp lesion.  Scalp lesion - Non-healing lesion on the scalp  - Lesion has been scratched a few times, latest last evening resulting in bleeding and drainage - Tenderness present, but not severely painful - No significant pruritus, numbness, tingling, or radiating pain - No fever - No recent tick bites - No activities with high tick exposure, though he plays golf - No use of a hat for sun protection - Lesion has persisted despite application of Neosporin and cleaning with alcohol  wipes - Duration of lesion is unspecified - Lesion was not addressed during a dermatology visit within the last three months  Immunosuppressive therapy - Currently taking prednisone  and methotrexate - Methotrexate dose is 15 mg weekly for suspected rheumatoid arthritis - No significant improvement in symptoms with current therapy  Musculoskeletal symptoms - Recent sx of sciatica, concerned if SE of methotrexate - No recent back injuries  Assessment & Plan Non-healing scalp lesion Lesion on scalp with irritation and bleeding, no infection signs. Recent dermatology exam showed no suspicious findings, but exam prior to pt noticing lesion. Possible irritation from scratching or insect bite. Picture uploaded to chart. - Advised against scratching or picking. - Instructed to wash with soap and shampoo, avoid alcohol  wipes. - Apply Neosporin once daily for just a few more days. - Recommend wearing a hat outdoors to prevent sun exposure. - Schedule  follow-up with me or PCP in two weeks to reassess. - Call and schedule dermatology appt for 2-3w just in case needed if no improvement in two weeks.  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 Methotrexate use for suspected rheumatoid arthritis but without symptom improvement. Methotrexate not typically linked to sciatica or neuropathy. - Discuss with prescribing physician about potential need to wean off methotrexate if no improvement.   Subjective:    Outpatient Medications Prior to Visit  Medication Sig Dispense Refill   allopurinol  (ZYLOPRIM ) 300 MG tablet Take 1 tablet (300 mg total) by mouth daily. 90 tablet 3   amLODipine  (NORVASC ) 10 MG tablet Take 1 tablet (10 mg total) by mouth at bedtime. 90 tablet 3   ARMOUR THYROID  60 MG tablet 1 tablet on an empty stomach Orally Once a day for 90 days     atorvastatin  (LIPITOR) 40 MG tablet Take 1 tablet (40 mg total) by mouth daily. 90 tablet 3   celecoxib  (CELEBREX ) 200 MG capsule Take 1 capsule (200 mg total) by mouth 2 (two) times daily. 60 capsule 0   clonazePAM  (KLONOPIN ) 1 MG tablet Take 1 tablet (1 mg total) by mouth daily as needed for anxiety. 10 tablet 0   Coenzyme Q10 (CO Q-10) 300 MG CAPS Take 300 mg by mouth daily.     diphenhydramine -acetaminophen  (TYLENOL  PM) 25-500 MG TABS tablet Take 1 tablet by mouth at bedtime as needed (sleep).     fluticasone  (FLONASE ) 50 MCG/ACT nasal spray Place 2 sprays into both nostrils daily as needed for allergies or rhinitis. 11.1  mL 1   Melatonin 10 MG TABS Take by mouth.     methotrexate (RHEUMATREX) 2.5 MG tablet Take 15 mg by mouth once a week.     Multiple Vitamin (MULTIVITAMIN WITH MINERALS) TABS tablet Take 1 tablet by mouth daily.     NEEDLE, DISP, 18 G (B-D HYPODERMIC NEEDLE 18GX1.5) 18G X 1-1/2 MISC Hypodermic Needles 18 gauge x 1 1/2  USE AS DIRECTED WITH TESTOSTERONE      pantoprazole  (PROTONIX ) 40 MG tablet TAKE 1 TABLET  BY MOUTH EVERY MORNING 90 tablet 3   predniSONE  (DELTASONE ) 5 MG tablet Take by mouth.     propranolol  ER (INDERAL  LA) 80 MG 24 hr capsule TAKE ONE CAPSULE BY MOUTH EVERY EVENING 90 capsule 1   testosterone  cypionate (DEPOTESTOSTERONE CYPIONATE) 200 MG/ML injection Inject 140 mg into the muscle every Wednesday.   5   traMADol  (ULTRAM ) 50 MG tablet Take 1 tablet (50 mg total) by mouth 3 (three) times daily as needed. 30 tablet 5   No facility-administered medications prior to visit.   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   Rash 09/17/2019   Situational depression    recently lost my mom and brother (03/06/2018)   Wears contact lenses    Wears glasses    Past Surgical History:  Procedure Laterality Date   BACK SURGERY     COLONOSCOPY     COLONOSCOPY W/ BIOPSIES AND POLYPECTOMY     EXCISIONAL TOTAL SHOULDER ARTHROPLASTY WITH ANTIBIOTIC SPACER Left 03/06/2018   Procedure: Left shoulder antibiotic spacer expalnt with glenoid bone grafting, antiobiotic spacer placement;  Surgeon: Sharl Selinda Dover, MD;  Location: [facility] OR;  Service: Orthopedics;  Laterality: Left;   EXCISIONAL TOTAL SHOULDER ARTHROPLASTY WITH ANTIBIOTIC SPACER Left 01/06/2020   Procedure: LEFT SHOULDER ARTHROPLASTY EXPLANT WITH ANTIBIOTIC SPACER PLACEMENT;  Surgeon: Sharl Selinda Dover, MD;  Location: [facility] OR;  Service: Orthopedics;  Laterality: Left;  2.5 hrs RNFA   IR FLUORO GUIDE CV LINE RIGHT  12/26/2016    IRRIGATION AND DEBRIDEMENT SHOULDER Left 05/27/2020   Procedure: Left shoulder antibiotic spacer exchange;  Surgeon: Sharl Selinda Dover, MD;  Location[facility] OR;  Service: Orthopedics;  Laterality: Left;  120 mins   KNEE ARTHROSCOPY Bilateral    X 10 total arthroscopies   KNEE CARTILAGE SURGERY Left 1977   cartilage removed   LASIK Bilateral    LUMBAR DISC SURGERY  2000s   ruptured disc   REVERSE SHOULDER ARTHROPLASTY Left 12/08/2016   Procedure: Revision left total shoulder to reverse total shoulder arthroplasty;  Surgeon: Sharl Selinda Dover, MD;  Location: [facility] OR;  Service: Orthopedics;  Laterality: Left;   REVERSE SHOULDER ARTHROPLASTY Left 01/01/2018   Procedure: Left reverse shouler explant with antibiotic spacer placement;  Surgeon: Sharl Selinda Dover, MD;  Location: [facility] ORS;  Service: Orthopedics;  Laterality: Left;  2.5 hrs   REVERSE SHOULDER ARTHROPLASTY Left 06/11/2018   Procedure: REVERSE  TOTAL SHOULDER REVISION;  Surgeon: Sharl Selinda Dover, MD;  Location: [facility] OR;  Service: Orthopedics;  Laterality: Left;   REVERSE SHOULDER ARTHROPLASTY Left 12/05/2018   Procedure: Left reverse shoulder explant, bone grafting;  Surgeon: Sharl Selinda Dover, MD;  Location: [facility] OR;  Service: Orthopedics;  Laterality: Left;  2.5 hrs   REVISION OF TOTAL SHOULDER Left 12/26/2018   REVISION TOTAL SHOULDER TO REVERSE TOTAL SHOULDER Left 07/16/2018   Procedure: Left shoulder irrigation and debridement with poly and head ball exchange;  Surgeon: Sharl Selinda Dover, MD;  Location: [facility] OR;  Service: Orthopedics;  Laterality: Left;  2 hrs   SHOULDER ARTHROSCOPY Right 1980s/1990s   bone spur removed   SHOULDER OPEN ROTATOR CUFF REPAIR Right 1970s   SHOULDER SURGERY Left 03/06/2018   antibiotic spacer expalnt with glenoid bone grafting, antiobiotic spacer placement   TOTAL KNEE ARTHROPLASTY Bilateral    TOTAL SHOULDER ARTHROPLASTY Left ~ 2014-2017 X 2   in [place]   TOTAL SHOULDER REPLACEMENT  Right ~ 2009   in [place]   TOTAL SHOULDER REVISION Left 12/26/2018   Procedure: Left prosthetic shoulder 1 component revision;  Surgeon: Sharl Selinda Dover, MD;  Location: [facility] OR;  Service: Orthopedics;  Laterality: Left;  90 mins   WISDOM TOOTH EXTRACTION     Allergies  Allergen Reactions   Fentanyl  Shortness Of Breath and Other (See Comments)    Reaction to [facility] ONLY > ? DOSE REGULATION ?    Gabapentin  Other (See Comments)   Oxycodone Other (See Comments)    Pt states he goes through hot and cold flashes withdrawal like symptoms      Objective:    Physical Exam Vitals and nursing note reviewed.  Constitutional:      General: He is not in acute distress.    Appearance: Normal appearance.  HENT:     Head: Normocephalic.  Cardiovascular:     Rate and Rhythm: Normal rate and regular rhythm.  Pulmonary:     Effort: Pulmonary effort is normal.     Breath sounds: Normal breath sounds.  Musculoskeletal:        General: Normal range of motion.     Cervical back: Normal range of motion.  Skin:    General: Skin is warm and dry.     Findings: Lesion (crown of scalp, approx 2.5cm area of dark brown, scab or mole appearing with mild erythema surrounding, pic uploaded) present.  Neurological:     Mental Status: He is alert and oriented to person, place, and time.  Psychiatric:        Mood and Affect: Mood normal.     BP 126/72 (BP Location: Left Arm, Patient Position: Sitting, Cuff Size: Large)   Temp 98.4 F (36.9 C) (Temporal)   Ht 5' 11 (1.803 m)   Wt 220 lb (99.8 kg)   BMI 30.68 kg/m  Wt Readings from Last 3 Encounters:  01/23/24 220 lb (99.8 kg)  11/06/23 210 lb 9.6 oz (95.5 kg)  10/09/23 207 lb (93.9 kg)      Lucius Krabbe, NP

## 2024-01-24 DIAGNOSIS — M5416 Radiculopathy, lumbar region: Secondary | ICD-10-CM | POA: Diagnosis not present

## 2024-01-24 DIAGNOSIS — Z6830 Body mass index (BMI) 30.0-30.9, adult: Secondary | ICD-10-CM | POA: Diagnosis not present

## 2024-01-30 ENCOUNTER — Other Ambulatory Visit: Payer: Self-pay | Admitting: Student

## 2024-01-30 DIAGNOSIS — M5416 Radiculopathy, lumbar region: Secondary | ICD-10-CM

## 2024-01-31 DIAGNOSIS — M51362 Other intervertebral disc degeneration, lumbar region with discogenic back pain and lower extremity pain: Secondary | ICD-10-CM | POA: Diagnosis not present

## 2024-01-31 DIAGNOSIS — M9903 Segmental and somatic dysfunction of lumbar region: Secondary | ICD-10-CM | POA: Diagnosis not present

## 2024-01-31 DIAGNOSIS — M4726 Other spondylosis with radiculopathy, lumbar region: Secondary | ICD-10-CM | POA: Diagnosis not present

## 2024-01-31 DIAGNOSIS — M9906 Segmental and somatic dysfunction of lower extremity: Secondary | ICD-10-CM | POA: Diagnosis not present

## 2024-02-01 ENCOUNTER — Ambulatory Visit
Admission: RE | Admit: 2024-02-01 | Discharge: 2024-02-01 | Disposition: A | Discharge status: Home / Self Care | Source: Ambulatory Visit | Attending: Student | Admitting: Student

## 2024-02-01 DIAGNOSIS — M4316 Spondylolisthesis, lumbar region: Secondary | ICD-10-CM | POA: Diagnosis not present

## 2024-02-01 DIAGNOSIS — M5416 Radiculopathy, lumbar region: Secondary | ICD-10-CM

## 2024-02-01 DIAGNOSIS — M4726 Other spondylosis with radiculopathy, lumbar region: Secondary | ICD-10-CM | POA: Diagnosis not present

## 2024-02-01 DIAGNOSIS — M48061 Spinal stenosis, lumbar region without neurogenic claudication: Secondary | ICD-10-CM | POA: Diagnosis not present

## 2024-02-01 DIAGNOSIS — M5116 Intervertebral disc disorders with radiculopathy, lumbar region: Secondary | ICD-10-CM | POA: Diagnosis not present

## 2024-02-02 ENCOUNTER — Other Ambulatory Visit

## 2024-02-04 DIAGNOSIS — R972 Elevated prostate specific antigen [PSA]: Secondary | ICD-10-CM | POA: Diagnosis not present

## 2024-02-04 DIAGNOSIS — E039 Hypothyroidism, unspecified: Secondary | ICD-10-CM | POA: Diagnosis not present

## 2024-02-04 DIAGNOSIS — E291 Testicular hypofunction: Secondary | ICD-10-CM | POA: Diagnosis not present

## 2024-02-06 ENCOUNTER — Ambulatory Visit: Admitting: Family Medicine

## 2024-02-06 DIAGNOSIS — Z6830 Body mass index (BMI) 30.0-30.9, adult: Secondary | ICD-10-CM | POA: Diagnosis not present

## 2024-02-06 DIAGNOSIS — M2550 Pain in unspecified joint: Secondary | ICD-10-CM | POA: Diagnosis not present

## 2024-02-06 DIAGNOSIS — M6281 Muscle weakness (generalized): Secondary | ICD-10-CM | POA: Diagnosis not present

## 2024-02-06 DIAGNOSIS — E291 Testicular hypofunction: Secondary | ICD-10-CM | POA: Diagnosis not present

## 2024-02-08 ENCOUNTER — Other Ambulatory Visit: Payer: Self-pay | Admitting: Family Medicine

## 2024-02-13 ENCOUNTER — Other Ambulatory Visit

## 2024-02-14 ENCOUNTER — Telehealth: Admitting: Family Medicine

## 2024-02-14 ENCOUNTER — Ambulatory Visit: Payer: Self-pay | Admitting: *Deleted

## 2024-02-14 ENCOUNTER — Encounter: Payer: Self-pay | Admitting: Family Medicine

## 2024-02-14 VITALS — Ht 71.0 in | Wt 212.0 lb

## 2024-02-14 DIAGNOSIS — U071 COVID-19: Secondary | ICD-10-CM

## 2024-02-14 DIAGNOSIS — M2550 Pain in unspecified joint: Secondary | ICD-10-CM

## 2024-02-14 DIAGNOSIS — M5416 Radiculopathy, lumbar region: Secondary | ICD-10-CM | POA: Diagnosis not present

## 2024-02-14 MED ORDER — NIRMATRELVIR/RITONAVIR (PAXLOVID)TABLET: 3.0000 | ORAL_TABLET | Freq: Two times a day (BID) | ORAL | 0 refills | Status: AC

## 2024-02-14 MED ORDER — AZITHROMYCIN 250 MG PO TABS: ORAL_TABLET | ORAL | 0 refills | Status: DC

## 2024-02-14 NOTE — Telephone Encounter (Signed)
  FYI Only or Action Required?: FYI only for provider.  Patient was last seen in primary care on 01/23/2024 by Lucius Krabbe, NP.  Called Nurse Triage reporting Covid Positive.  Symptoms began yesterday.  Interventions attempted: Nothing.  Symptoms are: gradually worsening.  Triage Disposition: Call PCP Within 24 Hours  Patient/caregiver understands and will follow disposition?: Yes                Copied from CRM #[id number]. Topic: Clinical - Red Word Triage >> Feb 14, 2024 10:27 AM Charolett L wrote: [facility] that prompted transfer to Nurse Triage: patient tested positive for covid Reason for Disposition  [1] HIGH RISK patient (e.g., weak immune system, age > 64 years, obesity with BMI 30 or higher, pregnant, chronic lung disease or other chronic medical condition) AND [2] COVID symptoms (e.g., cough, fever)  (Exceptions: Already seen by PCP and no new or worsening symptoms.)  Answer Assessment - Initial Assessment Questions VV appt scheduled today with PCP to review medication choices       1. COVID-19 DIAGNOSIS: How do you know that you have COVID? (e.g., positive lab test or self-test, diagnosed by doctor or NP/PA, symptoms after exposure).     Covid positive test at [facility] today  2. COVID-19 EXPOSURE: Was there any known exposure to COVID before the symptoms began? CDC Definition of close contact: within 6 feet (2 meters) for a total of 15 minutes or more over a 24-hour period.      Yes around someone Saturday 3. ONSET: When did the COVID-19 symptoms start?      Yesterday  4. WORST SYMPTOM: What is your worst symptom? (e.g., cough, fever, shortness of breath, muscle aches)     Cough scratchy throat congestion 5. COUGH: Do you have a cough? If Yes, ask: How bad is the cough?       Yes not bad  6. FEVER: Do you have a fever? If Yes, ask: What is your temperature, how was it measured, and when did it start?     na 7. RESPIRATORY STATUS: Describe  your breathing? (e.g., normal; shortness of breath, wheezing, unable to speak)      Normal  8. BETTER-SAME-WORSE: Are you getting better, staying the same or getting worse compared to yesterday?  If getting worse, ask, In what way?     Na  9. OTHER SYMPTOMS: Do you have any other symptoms?  (e.g., chills, fatigue, headache, loss of smell or taste, muscle pain, sore throat)     Cough, congestion scratchy throat  10. HIGH RISK DISEASE: Do you have any chronic medical problems? (e.g., asthma, heart or lung disease, weak immune system, obesity, etc.)       See hx  11. VACCINE: Have you had the COVID-19 vaccine? If Yes, ask: Which one, how many shots, when did you get it?       na 12. PREGNANCY: Is there any chance you are pregnant? When was your last menstrual period?       na 13. O2 SATURATION MONITOR:  Do you use an oxygen saturation monitor (pulse oximeter) at home? If Yes, ask What is your reading (oxygen level) today? What is your usual oxygen saturation reading? (e.g., 95%)       na  Protocols used: Coronavirus (COVID-19) Diagnosed or Suspected-A-AH

## 2024-02-14 NOTE — Assessment & Plan Note (Signed)
 Patient was scheduled to see his neurosurgeon today however this had to be canceled due to his recent diagnosis of COVID.  He has been having more low back issues.  We briefly reviewed his MRI today which he obtained a couple of weeks ago which did show several areas of potential stenosis.  He is currently on Celebrex  200 mg twice daily.  We did discuss potential other treatment options including gabapentin  however he would like to hold off on this for now.  He will follow-up with his neurosurgeon soon.

## 2024-02-14 NOTE — Assessment & Plan Note (Signed)
 Patient has been following with rheumatology since her last visit.  Recently completed prednisone  taper and is now on methotrexate.  They are treating here for possible rheumatoid arthritis versus PMR.  Overall his symptoms are manageable though is having more low back pain.

## 2024-02-14 NOTE — Progress Notes (Signed)
 Geoffrey West is a 68 y.o. male who presents today for a virtual office visit.  Assessment/Plan:  New/Acute Problems: COVID Discussed limitations of virtual visit and inability to perform physical exam.  Overall symptoms are currently mild and manageable however he is at [facility] for complication due to recent prednisone  taper and being on methotrexate due to his underlying autoimmune condition.  We did discuss treatment options.  Will start paxlovid .  Will send in also a pocket prescription for azithromycin  with instruction to not start unless he develops any signs or symptoms of a secondary infection.  We encouraged hydration.  He can use over-the-counter meds as needed.  Chronic Problems Addressed Today: Polyarthralgia Patient has been following with rheumatology since her last visit.  Recently completed prednisone  taper and is now on methotrexate.  They are treating here for possible rheumatoid arthritis versus PMR.  Overall his symptoms are manageable though is having more low back pain.  Lumbar radiculopathy Patient was scheduled to see his neurosurgeon today however this had to be canceled due to his recent diagnosis of COVID.  He has been having more low back issues.  We briefly reviewed his MRI today which he obtained a couple of weeks ago which did show several areas of potential stenosis.  He is currently on Celebrex  200 mg twice daily.  We did discuss potential other treatment options including gabapentin  however he would like to hold off on this for now.  He will follow-up with his neurosurgeon soon.     Subjective:  HPI:  See Assessment / plan for status of chronic conditions.    Discussed the use of AI scribe software for clinical note transcription with the patient, who gave verbal consent to proceed.  History of Present Illness Geoffrey West is a 68 year old male with rheumatoid arthritis who presents with COVID-19 symptoms.  He tested positive for COVID-19  after having dinner with a couple who later informed him of their positive COVID-19 status. His symptoms began yesterday and include a scratchy and sore throat, congestion, cough, and fatigue. No fever, chills, shortness of breath, or difficulty breathing.  He is concerned about his immune system due to being on methotrexate for rheumatoid arthritis, which suppresses the immune system. He is also on a tapering dose of prednisone , with today being his last dose. He experiences intermittent pain in his shoulders and knees, describing it as feeling like 'being hit by a truck'.  He has been experiencing pain and numbness down his leg, which he attributes to sciatica or a similar condition. 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 He has been taking Celebrex  twice a day, which provides some relief for his leg pain, although it is not completely resolved. He has previously been on meloxicam and has tramadol  available for use as needed. He has tried gabapentin  in the past but discontinued it due to experiencing 'weird thoughts' and no significant benefit.  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 He is currently not taking atorvastatin , as advised by his rheumatologist, due to elevated cholesterol levels potentially caused by prednisone .         Objective/Observations  Physical Exam: Gen: NAD, resting comfortably Pulm: Normal work of breathing Neuro: Grossly normal, moves all extremities Psych: Normal affect and thought content  Virtual Visit via Video   I connected with Geoffrey West on 02/14/24  at  2:20 PM EDT by a video enabled telemedicine application and verified that I am speaking with the correct person using two identifiers. The limitations of evaluation and management by telemedicine and the availability of in person appointments were discussed. The patient  expressed understanding and agreed to proceed.   Patient location: Home Provider location: [place] [employer] Persons participating in the virtual visit: Myself and Patient     Worth HERO. Kennyth, MD 02/14/2024 2:22 PM

## 2024-02-14 NOTE — Telephone Encounter (Signed)
 Noted

## 2024-02-19 ENCOUNTER — Telehealth: Payer: Self-pay

## 2024-02-19 DIAGNOSIS — M109 Gout, unspecified: Secondary | ICD-10-CM | POA: Diagnosis not present

## 2024-02-19 DIAGNOSIS — M0609 Rheumatoid arthritis without rheumatoid factor, multiple sites: Secondary | ICD-10-CM | POA: Diagnosis not present

## 2024-02-19 DIAGNOSIS — M353 Polymyalgia rheumatica: Secondary | ICD-10-CM | POA: Diagnosis not present

## 2024-02-19 DIAGNOSIS — R5383 Other fatigue: Secondary | ICD-10-CM | POA: Diagnosis not present

## 2024-02-19 LAB — COMPREHENSIVE METABOLIC PANEL WITH GFR
Albumin: 4.4 (ref 3.5–5.0)
Calcium: 10 (ref 8.7–10.7)
Globulin: 2.4
eGFR: 53

## 2024-02-19 LAB — HEPATIC FUNCTION PANEL
ALT: 19 U/L (ref 10–40)
AST: 19 (ref 14–40)
Alkaline Phosphatase: 76 (ref 25–125)
Bilirubin, Total: 0.9

## 2024-02-19 LAB — CBC AND DIFFERENTIAL
HCT: 52 (ref 41–53)
Hemoglobin: 17.6 — AB (ref 13.5–17.5)
Neutrophils Absolute: 4.9
Platelets: 237 K/uL (ref 150–400)
WBC: 7.4

## 2024-02-19 LAB — BASIC METABOLIC PANEL WITH GFR
BUN: 20 (ref 4–21)
CO2: 25 — AB (ref 13–22)
Chloride: 99 (ref 99–108)
Creatinine: 1.5 — AB (ref 0.6–1.3)
Glucose: 2
Potassium: 4.5 meq/L (ref 3.5–5.1)
Sodium: 141 (ref 137–147)

## 2024-02-19 LAB — CBC: RBC: 5.28 — AB (ref 3.87–5.11)

## 2024-02-19 NOTE — Telephone Encounter (Signed)
 He has a Midwife and recently had an MRI performed by them. Can we check what it is he needs?

## 2024-02-19 NOTE — Telephone Encounter (Signed)
 Copied from CRM [id number]. Topic: General - Other >> Feb 18, 2024 11:30 AM Revonda D wrote: Reason for CRM: Pt is requesting to speak with Dr.Parker in regards to back pain and wanting to see if he could get a referral submitted. Pt would like a callback with an update today if possible.

## 2024-02-19 NOTE — Telephone Encounter (Signed)
 I called and spoke with patient and he is requesting a refill of Tramadol . Patient had to reschedule appointment with Neuro due to having Covid last week.    Requesting: Tramadol  Last Visit: 11/06/2023 Next Visit: Visit date not found Last Refill: 08/02/2023  Please Advise

## 2024-02-20 MED ORDER — TRAMADOL HCL 50 MG PO TABS: 50.0000 mg | ORAL_TABLET | Freq: Three times a day (TID) | ORAL | 5 refills | Status: DC | PRN

## 2024-02-21 DIAGNOSIS — M5416 Radiculopathy, lumbar region: Secondary | ICD-10-CM | POA: Diagnosis not present

## 2024-02-21 DIAGNOSIS — Z6830 Body mass index (BMI) 30.0-30.9, adult: Secondary | ICD-10-CM | POA: Diagnosis not present

## 2024-02-22 NOTE — Telephone Encounter (Signed)
 Patient notified that Rx sent to pharmacy.  Thanks, BMS/CMA

## 2024-03-12 DIAGNOSIS — M5416 Radiculopathy, lumbar region: Secondary | ICD-10-CM | POA: Diagnosis not present

## 2024-03-13 ENCOUNTER — Other Ambulatory Visit: Payer: Self-pay | Admitting: Family Medicine

## 2024-03-14 ENCOUNTER — Other Ambulatory Visit: Payer: Self-pay | Admitting: Family Medicine

## 2024-03-18 ENCOUNTER — Ambulatory Visit: Payer: Self-pay

## 2024-03-18 ENCOUNTER — Other Ambulatory Visit: Payer: Self-pay | Admitting: Family Medicine

## 2024-03-18 MED ORDER — CLONAZEPAM 1 MG PO TABS: 1.0000 mg | ORAL_TABLET | Freq: Every day | ORAL | 0 refills | Status: DC | PRN

## 2024-03-18 NOTE — Telephone Encounter (Signed)
 FYI Only or Action Required?: FYI only for provider.  Patient was last seen in primary care on 02/14/2024 by Kennyth Worth HERO, MD.  Called Nurse Triage reporting Back Pain and Anxiety.  Symptoms began to worsen a week ago.  Interventions attempted: Tramadol ; ice.  Symptoms are: lower back pain radiates down both legs, numbness in both legs worse on the left side, he states the numbness makes his left leg feel weak; anxiety and depression gradually worsening.  Triage Disposition: See PCP When Office is Open (Within 3 Days) (overriding See HCP Within 4 Hours (Or PCP Triage))  Patient/caregiver understands and will follow disposition?: Yes          Copied from CRM #[id number]. Topic: Clinical - Red Word Triage >> Mar 18, 2024 10:02 AM Suzen RAMAN wrote: Red Word that prompted transfer to Nurse Triage: lower back pain that travels down leg and causes numbness and anxiety. Requesting an appt Reason for Disposition  [1] Pain radiates into the thigh or further down the leg AND [2] both legs  Answer Assessment - Initial Assessment Questions 1. ONSET: When did the pain begin? (e.g., minutes, hours, days)     Chronic, he state he has had it for years but it was doing better. Worsening X 3 months off and on. Worsening over the past week since injections given on Wednesday.  2. LOCATION: Where does it hurt? (upper, mid or lower back)     Lower back.  3. SEVERITY: How bad is the pain?  (e.g., Scale 1-10; mild, moderate, or severe)     7-8/10.   4. PATTERN: Is the pain constant? (e.g., yes, no; constant, intermittent)      Constant, eases off if he rests. Worse when standing or getting up.  5. RADIATION: Does the pain shoot into your legs or somewhere else?     Mainly on the left leg. He states it goes all the way down to his left side (toes) and right side he states it goes down to his hip and knee.  6. CAUSE:  What do you think is causing the back pain?      Sciatica.  7.  BACK OVERUSE:  Any recent lifting of heavy objects, strenuous work or exercise?     No.  8. MEDICINES: What have you taken so far for the pain? (e.g., nothing, acetaminophen , NSAIDS)     Ice, tramadol . Neurosurgeon did steroid injection last week.  9. NEUROLOGIC SYMPTOMS: Do you have any weakness, numbness, or problems with bowel/bladder control?     Numbness down both legs, he states he feels like he drags his left leg from the numbness (weakness). Denies any problems with bowel or bladder control.  10. OTHER SYMPTOMS: Do you have any other symptoms? (e.g., fever, abdomen pain, burning with urination, blood in urine)       Anxiety(he states when he thinks about his pain and that he thinks there is no solution) and depression (no active thoughts of harming himself). Denies fever, abdominal pain, painful urination.  11. PREGNANCY: Is there any chance you are pregnant? When was your last menstrual period?       N/A.  Patient declined sooner appointments with other providers, he states he would prefer to see his PCP for this. Scheduled for Thursday and advised patient to call back for any new or worsening symptoms.  Protocols used: Back Pain-A-AH

## 2024-03-20 ENCOUNTER — Ambulatory Visit (INDEPENDENT_AMBULATORY_CARE_PROVIDER_SITE_OTHER): Admitting: Family Medicine

## 2024-03-20 ENCOUNTER — Encounter: Payer: Self-pay | Admitting: Family Medicine

## 2024-03-20 VITALS — BP 120/80 | HR 71 | Temp 98.3°F | Ht 71.0 in | Wt 214.2 lb

## 2024-03-20 DIAGNOSIS — I10 Essential (primary) hypertension: Secondary | ICD-10-CM | POA: Diagnosis not present

## 2024-03-20 DIAGNOSIS — M2550 Pain in unspecified joint: Secondary | ICD-10-CM | POA: Diagnosis not present

## 2024-03-20 DIAGNOSIS — M5416 Radiculopathy, lumbar region: Secondary | ICD-10-CM

## 2024-03-20 DIAGNOSIS — F419 Anxiety disorder, unspecified: Secondary | ICD-10-CM

## 2024-03-20 LAB — SEDIMENTATION RATE: Sed Rate: 5 mm/h (ref 0–20)

## 2024-03-20 LAB — C-REACTIVE PROTEIN: CRP: <0.5 mg/dL (ref 0.5–20.0)

## 2024-03-20 MED ORDER — PREDNISONE 20 MG PO TABS: 20.0000 mg | ORAL_TABLET | Freq: Every day | ORAL | 0 refills | Status: DC

## 2024-03-20 MED ORDER — GABAPENTIN 300 MG PO CAPS: 300.0000 mg | ORAL_CAPSULE | Freq: Every day | ORAL | 3 refills | Status: DC

## 2024-03-20 NOTE — Patient Instructions (Signed)
 It was very nice to see you today!  VISIT SUMMARY: You came in for a follow-up on your chronic back pain and anxiety. We discussed your recent worsening of back pain, the ineffectiveness of epidural shots, and your anxiety symptoms.  YOUR PLAN: CHRONIC LOW BACK PAIN WITH LUMBAR RADICULOPATHY: Your chronic back pain has worsened recently, and the epidural shots did not help.  -Start taking prednisone  20 mg daily for two weeks. -Start gabapentin  300 mg nightly -Consider using gabapentin  for nerve pain management, despite previous adverse effects. -Continue taking Celebrex  and tramadol  as needed. -Follow up with the neurosurgeon for further evaluation.  POSSIBLE POLYMYALGIA RHEUMATICA: Your symptoms and relief from prednisone  suggest an inflammatory condition, possibly polymyalgia rheumatica. -We will order sedimentation rate and CRP tests to check for inflammation. -Monitor your response to the prednisone  treatment.  ANXIETY WITH PANIC ATTACKS: You experience anxiety and panic attacks, especially at night, which are relieved by clonazepam . -Continue using clonazepam  as needed for panic attacks. -Monitor your anxiety symptoms and let us  know if they worsen or if you need further help.  Return if symptoms worsen or fail to improve.   Take care, Dr Kennyth  PLEASE NOTE:  If you had any lab tests, please let us  know if you have not heard back within a few days. You may see your results on mychart before we have a chance to review them but we will give you a call once they are reviewed by us .   If we ordered any referrals today, please let us  know if you have not heard from their office within the next week.   If you had any urgent prescriptions sent in today, please check with the pharmacy within an hour of our visit to make sure the prescription was transmitted appropriately.   Please try these tips to maintain a healthy lifestyle:  Eat at least 3 REAL meals and 1-2 snacks per day.  Aim  for no more than 5 hours between eating.  If you eat breakfast, please do so within one hour of getting up.   Each meal should contain half fruits/vegetables, one quarter protein, and one quarter carbs (no bigger than a computer mouse)  Cut down on sweet beverages. This includes juice, soda, and sweet tea.   Drink at least 1 glass of water with each meal and aim for at least 8 glasses per day  Exercise at least 150 minutes every week.

## 2024-03-20 NOTE — Assessment & Plan Note (Signed)
 Symptoms have worsened recently though this is mostly due to his ongoing issues with stress related to chronic pain from his back issues and polyarthralgia.  He has been on SSRI in the past but does not think he needs to restart this at this point.  He has a prescription for clonazepam  that he uses sporadically as needed for panic attacks.  Usually does well with this.  Does not need to refill today.  Hopefully will have improvement as we are treating his pain as above.  He will follow-up with us  in 2 weeks if he needs further assistance.

## 2024-03-20 NOTE — Assessment & Plan Note (Signed)
 Symptoms are not controlled.  Since our last visit he has been following with neurosurgery and did have a back injection a few weeks ago however still having significant and debilitating symptoms.  He will be seeing neurosurgery again however this is still a month away.  He is looking for additional pain control options.  He has previously done very well with prednisone .  Will send in 20 mg daily for 2 weeks.  He has done well with this dose previously.  We also did discuss trial of gabapentin .  He did try this several years ago but does not think that he did well with it previously though does not remember exactly why.  He is willing to try this again.  We will send prescription in for 300 mg nightly.  He is aware of potential side effects.  He will follow-up with us  in a few weeks via MyChart and we can adjust medications as tolerated though he will need to follow-up with his neurosurgeon next month for ongoing management.

## 2024-03-20 NOTE — Assessment & Plan Note (Signed)
 Follows with rheumatology.  Tried methotrexate in the past however this did not help with the symptoms.  His symptoms are concerning for potential polymyalgia rheumatica.  Will be starting prednisone  burst as above which hopefully should help some with his symptoms.  Will also check sed rate and CRP today.  He will follow-up with rheumatology in a few weeks.

## 2024-03-20 NOTE — Assessment & Plan Note (Signed)
 Blood pressure at goal today on amlodipine  10 mg daily and propranolol  80 mg daily.

## 2024-03-20 NOTE — Progress Notes (Signed)
 Geoffrey West is a 68 y.o. male who presents today for an office visit.  Assessment/Plan:  Chronic Problems Addressed Today: Lumbar radiculopathy Symptoms are not controlled.  Since our last visit he has been following with neurosurgery and did have a back injection a few weeks ago however still having significant and debilitating symptoms.  He will be seeing neurosurgery again however this is still a month away.  He is looking for additional pain control options.  He has previously done very well with prednisone .  Will send in 20 mg daily for 2 weeks.  He has done well with this dose previously.  We also did discuss trial of gabapentin .  He did try this several years ago but does not think that he did well with it previously though does not remember exactly why.  He is willing to try this again.  We will send prescription in for 300 mg nightly.  He is aware of potential side effects.  He will follow-up with us  in a few weeks via MyChart and we can adjust medications as tolerated though he will need to follow-up with his neurosurgeon next month for ongoing management.  Polyarthralgia Follows with rheumatology.  Tried methotrexate in the past however this did not help with the symptoms.  His symptoms are concerning for potential polymyalgia rheumatica.  Will be starting prednisone  burst as above which hopefully should help some with his symptoms.  Will also check sed rate and CRP today.  He will follow-up with rheumatology in a few weeks.  Hypertension Blood pressure at goal today on amlodipine  10 mg daily and propranolol  80 mg daily.  Anxiety Symptoms have worsened recently though this is mostly due to his ongoing issues with stress related to chronic pain from his back issues and polyarthralgia.  He has been on SSRI in the past but does not think he needs to restart this at this point.  He has a prescription for clonazepam  that he uses sporadically as needed for panic attacks.  Usually does  well with this.  Does not need to refill today.  Hopefully will have improvement as we are treating his pain as above.  He will follow-up with us  in 2 weeks if he needs further assistance.     Subjective:  HPI:  See assessment / plan for status of chronic conditions.   Discussed the use of AI scribe software for clinical note transcription with the patient, who gave verbal consent to proceed.  History of Present Illness Geoffrey West is a 68 year old male with chronic back pain and anxiety who presents for follow-up on his back pain management.  He has persistent back pain that has worsened recently. Epidural shots administered a week ago did not provide relief and instead exacerbated his symptoms for several days. The pain is described as a stabbing sensation in the buttocks, with numbness radiating down the leg to the toes. Standing in one place aggravates the pain, although there is a slight improvement today with only mild numbness.  He has a history of back problems and his MRI shows significant issues requiring extensive spinal fusion surgery. Epidural shots were advised as an alternative, but follow-up scheduling has been delayed, contributing to his anxiety.  Previously, prednisone  prescribed by a rheumatologist provided relief for his back pain. He is currently taking Celebrex  and occasionally uses tramadol , which he finds more effective when combined with a caffeine supplement called Xenadrine. He exercises regularly, including riding a recumbent bike and  stretching, which he has recently resumed after a period of inactivity due to pain.  He experiences anxiety, particularly at night, sometimes waking up with difficulty breathing and elevated blood pressure. Clonazepam  is used as needed, which helps alleviate these symptoms. He has a history of using antidepressants like Paxil  or Prozac during past surgeries but does not feel the need for them currently.          Objective:  Physical Exam: BP 120/80   Pulse 71   Temp 98.3 F (36.8 C) (Temporal)   Ht 5' 11 (1.803 m)   Wt 214 lb 3.2 oz (97.2 kg)   SpO2 95%   BMI 29.87 kg/m   Gen: No acute distress, resting comfortably Neuro: Grossly normal, moves all extremities Psych: Normal affect and thought content      Geoffrey Beswick M. Kennyth, MD 03/20/2024 9:42 AM

## 2024-03-21 ENCOUNTER — Ambulatory Visit: Payer: Self-pay | Admitting: Family Medicine

## 2024-03-21 NOTE — Progress Notes (Signed)
Inflammatory markers are normal.

## 2024-03-31 DIAGNOSIS — L720 Epidermal cyst: Secondary | ICD-10-CM | POA: Diagnosis not present

## 2024-03-31 DIAGNOSIS — D485 Neoplasm of uncertain behavior of skin: Secondary | ICD-10-CM | POA: Diagnosis not present

## 2024-03-31 DIAGNOSIS — L820 Inflamed seborrheic keratosis: Secondary | ICD-10-CM | POA: Diagnosis not present

## 2024-03-31 DIAGNOSIS — L281 Prurigo nodularis: Secondary | ICD-10-CM | POA: Diagnosis not present

## 2024-03-31 DIAGNOSIS — L821 Other seborrheic keratosis: Secondary | ICD-10-CM | POA: Diagnosis not present

## 2024-03-31 DIAGNOSIS — D2262 Melanocytic nevi of left upper limb, including shoulder: Secondary | ICD-10-CM | POA: Diagnosis not present

## 2024-04-01 DIAGNOSIS — Z6829 Body mass index (BMI) 29.0-29.9, adult: Secondary | ICD-10-CM | POA: Diagnosis not present

## 2024-04-01 DIAGNOSIS — M5416 Radiculopathy, lumbar region: Secondary | ICD-10-CM | POA: Diagnosis not present

## 2024-04-02 ENCOUNTER — Encounter: Payer: Self-pay | Admitting: Rehabilitative and Restorative Service Providers"

## 2024-04-02 ENCOUNTER — Other Ambulatory Visit: Payer: Self-pay

## 2024-04-02 ENCOUNTER — Ambulatory Visit: Attending: Student | Admitting: Rehabilitative and Restorative Service Providers"

## 2024-04-02 DIAGNOSIS — M5459 Other low back pain: Secondary | ICD-10-CM | POA: Diagnosis not present

## 2024-04-02 DIAGNOSIS — R2689 Other abnormalities of gait and mobility: Secondary | ICD-10-CM | POA: Insufficient documentation

## 2024-04-02 DIAGNOSIS — R252 Cramp and spasm: Secondary | ICD-10-CM | POA: Insufficient documentation

## 2024-04-02 DIAGNOSIS — R293 Abnormal posture: Secondary | ICD-10-CM | POA: Insufficient documentation

## 2024-04-02 DIAGNOSIS — M6281 Muscle weakness (generalized): Secondary | ICD-10-CM | POA: Diagnosis not present

## 2024-04-02 NOTE — Therapy (Signed)
 OUTPATIENT PHYSICAL THERAPY THORACOLUMBAR EVALUATION   Patient Name: Geoffrey West MRN: [id number] DOB:11/08/1955, 68 y.o., male Today's Date: 04/02/2024  END OF SESSION:  PT End of Session - 04/02/24 1237     Visit Number 1    Date for Recertification  05/23/24    Authorization Type BC/BS Medicare/[employer] [place]    Progress Note Due on Visit 10    PT Start Time 1231    PT Stop Time 1310    PT Time Calculation (min) 39 min    Activity Tolerance Patient tolerated treatment well    Behavior During Therapy WFL for tasks assessed/performed          Past Medical History:  Diagnosis Date   Anxiety    Arthritis    all over (03/06/2018)   Close exposure to COVID-19 virus 07/08/2019   GERD (gastroesophageal reflux disease)    Gout    on daily RX (03/06/2018)   History of kidney stones     passed x 1   Hypertension    Hypertension 12/25/2016   Loosening of total shoulder replacement    left   Low testosterone  12/25/2016   Migraine    haven't had more than 1/year since propanolol started (03/06/2018)   Propionibacterium infection 08/28/2019   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   Rash 09/17/2019   Situational depression    recently lost my mom and brother (03/06/2018)   Wears contact lenses    Wears glasses    Past Surgical History:  Procedure Laterality Date   BACK SURGERY     COLONOSCOPY     COLONOSCOPY W/ BIOPSIES AND POLYPECTOMY     EXCISIONAL TOTAL SHOULDER ARTHROPLASTY WITH ANTIBIOTIC SPACER Left 03/06/2018   Procedure: Left shoulder antibiotic spacer expalnt with glenoid bone grafting, antiobiotic spacer placement;  Surgeon: Geoffrey Selinda Dover, MD;  Location: [facility] OR;  Service: Orthopedics;  Laterality: Left;   EXCISIONAL TOTAL SHOULDER ARTHROPLASTY WITH ANTIBIOTIC SPACER Left 01/06/2020   Procedure: LEFT SHOULDER ARTHROPLASTY EXPLANT WITH ANTIBIOTIC SPACER PLACEMENT;  Surgeon: Geoffrey Selinda Dover, MD;  Location: [facility] OR;  Service: Orthopedics;  Laterality: Left;   2.5 hrs RNFA   IR FLUORO GUIDE CV LINE RIGHT  12/26/2016   IRRIGATION AND DEBRIDEMENT SHOULDER Left 05/27/2020   Procedure: Left shoulder antibiotic spacer exchange;  Surgeon: Geoffrey Selinda Dover, MD;  Location: [facility] OR;  Service: Orthopedics;  Laterality: Left;  120 mins   KNEE ARTHROSCOPY Bilateral    X 10 total arthroscopies   KNEE CARTILAGE SURGERY Left 1977   cartilage removed   LASIK Bilateral    LUMBAR DISC SURGERY  2000s   ruptured disc   REVERSE SHOULDER ARTHROPLASTY Left 12/08/2016   Procedure: Revision left total shoulder to reverse total shoulder arthroplasty;  Surgeon: Geoffrey Selinda Dover, MD;  Location: [facility] OR;  Service: Orthopedics;  Laterality: Left;   REVERSE SHOULDER ARTHROPLASTY Left 01/01/2018   Procedure: Left reverse shouler explant with antibiotic spacer placement;  Surgeon: Geoffrey Selinda Dover, MD;  Location: [facility] ORS;  Service: Orthopedics;  Laterality: Left;  2.5 hrs   REVERSE SHOULDER ARTHROPLASTY Left 06/11/2018   Procedure: REVERSE TOTAL SHOULDER REVISION;  Surgeon: Geoffrey Selinda Dover, MD;  Location: [facility] OR;  Service: Orthopedics;  Laterality: Left;   REVERSE SHOULDER ARTHROPLASTY Left 12/05/2018   Procedure: Left reverse shoulder explant, bone grafting;  Surgeon: Geoffrey Selinda Dover, MD;  Location: [facility] OR;  Service: Orthopedics;  Laterality: Left;  2.5 hrs   REVISION OF TOTAL SHOULDER Left 12/26/2018   REVISION TOTAL SHOULDER  TO REVERSE TOTAL SHOULDER Left 07/16/2018   Procedure: Left shoulder irrigation and debridement with poly and head ball exchange;  Surgeon: Geoffrey Selinda Dover, MD;  Location: [facility] OR;  Service: Orthopedics;  Laterality: Left;  2 hrs   SHOULDER ARTHROSCOPY Right 1980s/1990s   bone spur removed   SHOULDER OPEN ROTATOR CUFF REPAIR Right 1970s   SHOULDER SURGERY Left 03/06/2018   antibiotic spacer expalnt with glenoid bone grafting, antiobiotic spacer placement   TOTAL KNEE ARTHROPLASTY Bilateral    TOTAL SHOULDER ARTHROPLASTY Left ~  2014-2017 X 2   in [place]   TOTAL SHOULDER REPLACEMENT Right ~ 2009   in [place]   TOTAL SHOULDER REVISION Left 12/26/2018   Procedure: Left prosthetic shoulder 1 component revision;  Surgeon: Geoffrey Selinda Dover, MD;  Location: [facility] OR;  Service: Orthopedics;  Laterality: Left;  90 mins   WISDOM TOOTH EXTRACTION     Patient Active Problem List   Diagnosis Date Noted   Dyslipidemia 09/05/2022   Melanoma in situ (HCC) 09/04/2022   Polyarthralgia 03/21/2022   Hypothyroidism 03/21/2022   Seasonal allergies 03/21/2022   GERD (gastroesophageal reflux disease) 03/21/2022   Anxiety 12/09/2019   Hypertension 12/25/2016   Low testosterone  12/25/2016   Gout 12/25/2016   Lumbar radiculopathy 07/05/2013    PCP: Geoffrey West HERO, MD  REFERRING PROVIDER: Orlean Suzen Lacks, NP  REFERRING DIAG: M54.16 (ICD-10-CM) - Radiculopathy, lumbar region  Rationale for Evaluation and Treatment: Rehabilitation  THERAPY DIAG:  Other low back pain - Plan: PT plan of care cert/re-cert  Other abnormalities of gait and mobility - Plan: PT plan of care cert/re-cert  Cramp and spasm - Plan: PT plan of care cert/re-cert  Muscle weakness (generalized) - Plan: PT plan of care cert/re-cert  Abnormal posture - Plan: PT plan of care cert/re-cert  ONSET DATE: chronic, but worse in the past 3 months  SUBJECTIVE:                                                                                                                                                                                           SUBJECTIVE STATEMENT: Pt has been having back pain that has been debilitating in nature.  He was referred from his PCP to a Neurosurgeon and was given an injection.  On his visit with Dr Geoffrey, he was given another round of oral Prednisone  in an attempt to decrease his pain.  Patient states that he has tried acupuncture and chiropractor.  Patient reports that he enjoys playing golf, but has not been able to  play in the past 3 months.  PERTINENT HISTORY:  Polyarthralgia, HTN, Anxiety, gout, Left total  shoulder arthroplasty (multiple revisions), right total shoulder arthroplasty, bilateral TKA, left-sided microdiscectomy at L5-S1 in 2015  PAIN:  Are you having pain? Yes: NPRS scale: 5-6/10 Pain location: back and radiating down into bilat hips Pain description: tightness and numbness in leg Aggravating factors: standing Relieving factors: sitting  PRECAUTIONS: None  RED FLAGS: None   WEIGHT BEARING RESTRICTIONS: No  FALLS:  Has patient fallen in last 6 months? No  LIVING ENVIRONMENT: Lives with: lives alone Lives in: House/apartment Stairs: one level home Has following equipment at home: None  OCCUPATION: retired  PLOF: Independent and Leisure: yard work, exercising at [employer], I just like to be active  PATIENT GOALS: To be able to do as much as I can without pain.  NEXT MD VISIT: lumbar injection on 04/10/2024, then will schedule follow up  OBJECTIVE:  Note: Objective measures were completed at Evaluation unless otherwise noted.  DIAGNOSTIC FINDINGS:  Lumbar MRI on 02/01/2024: IMPRESSION: 1. 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 Multifactorial spinal stenosis throughout the region that could be symptomatic. 2. L3-4: 2 mm retrolisthesis. Facet arthropathy with 2 mm of anterolisthesis. Synovial cyst projecting inward from the facet on the right measuring up to 1 cm in size. Moderate multifactorial stenosis. Lateral recess stenosis right worse than left because of the synovial cyst. Foraminal narrowing right worse than left. Neural compression quite possible at this level, particularly on the right. 3. L4-5: 2 mm degenerative anterolisthesis. Moderate multifactorial canal stenosis, lateral recess stenosis and foraminal stenosis that could be symptomatic. 4. L5-S1: 2 mm degenerative anterolisthesis. Moderate  multifactorial stenosis of the canal, lateral recesses and neural foramina that could be symptomatic. 5. T12-L1 and L1-2: Moderate multifactorial stenosis but without definite neural compression. 6. L2-3: Mild multifactorial stenosis but without definite neural compression.  PATIENT SURVEYS:  Modified Oswestry:  MODIFIED OSWESTRY DISABILITY SCALE  Score Date: 04/02/2024  Pain intensity 3 =  Pain medication provides me with moderate relief from pain.  2. Personal care (washing, dressing, etc.) 2 =  It is painful to take care of myself, and I am slow and careful.  3. Lifting 1 = I can lift heavy weights, but it causes increased pain.  4. Walking 3 =  Pain prevents me from walking more than  mile.  5. Sitting 0 =  I can sit in any chair as long as I like.  6. Standing 5 =  Pain prevents me from standing at all  7. Sleeping 2 =  Even when I take pain medication, I sleep less than 6 hours  8. Social Life 2 = Pain prevents me from participating in more energetic activities (eg. sports, dancing).  9. Traveling 1 =  I can travel anywhere, but it increases my pain.  10. Employment/ Homemaking 1 = My normal homemaking/job activities increase my pain, but I can still perform all that is required of me  Total 20/50 = 40%   Interpretation of scores: Score Category Description  0-20% Minimal Disability The patient can cope with most living activities. Usually no treatment is indicated apart from advice on lifting, sitting and exercise  21-40% Moderate Disability The patient experiences more pain and difficulty with sitting, lifting and standing. Travel and social life are more difficult and they may be disabled from work. Personal care, sexual activity and sleeping are not grossly affected, and the patient can usually be managed by conservative means  41-60% Severe Disability Pain remains the main problem in  this group, but activities of daily living are affected. These patients require a detailed  investigation  61-80% Crippled Back pain impinges on all aspects of the patient's life. Positive intervention is required  81-100% Bed-bound  These patients are either bed-bound or exaggerating their symptoms  Bluford FORBES Zoe DELENA Karon DELENA, et al. Surgery versus conservative management of stable thoracolumbar fracture: the PRESTO feasibility RCT. [place] ([place]): [employer]; 2021 Nov. [facility] Technology Assessment, No. 25.62.) Appendix 3, Oswestry Disability Index category descriptors. Available from: FindJewelers.cz  Minimally Clinically Important Difference (MCID) = 12.8%  COGNITION: Overall cognitive status: Within functional limits for tasks assessed     SENSATION: Reports numbness down bilateral legs  MUSCLE LENGTH: Hamstrings: slight tightness bilaterally  POSTURE: rounded shoulders and forward head  PALPATION: Tightness noted throughout paraspinals and upper traps  LUMBAR ROM:   Eval:  WFL  LOWER EXTREMITY ROM:     Eval:  WFL  LOWER EXTREMITY MMT:    Eval: Right hip strength of 4/5 Left hip strength of 4-/5 grossly throughout Bilateral quad and hamstring strength is WFL  LUMBAR SPECIAL TESTS:  Slump test: Negative  FUNCTIONAL TESTS:  Eval: 5 times sit to stand: 10.52 sec Timed up and go (TUG): 7.2 sec Single Leg Stance:  right- 3.35 sec, left- 2.91 sec  GAIT: Distance walked: >500 ft Assistive device utilized: None Level of assistance: Complete Independence Comments: Pt reports pain as he walks increased distances  TREATMENT DATE:  04/02/2024:  Reviewed HEP and role of PT                                                                                                                               PATIENT EDUCATION:  Education details: Issued HEP Person educated: Patient Education method: Explanation, Demonstration, and Handouts Education comprehension: verbalized understanding and returned  demonstration  HOME EXERCISE PROGRAM: Access Code: [email or web address] URL: [email or web address] Date: 04/02/2024 Prepared by: Jarrell Eliel Dudding  Exercises - Standing Hamstring Stretch with Step  - 1 x daily - 7 x weekly - 2 reps - 20 sec hold - Seated Piriformis Stretch with Trunk Bend  - 1 x daily - 7 x weekly - 2 reps - 20 sec hold - Clamshell with Resistance  - 1 x daily - 7 x weekly - 2 sets - 10 reps - Bridge with Hip Abduction and Resistance  - 1 x daily - 7 x weekly - 2 sets - 10 reps - Supine Posterior Pelvic Tilt  - 1 x daily - 7 x weekly - 2 sets - 10 reps - Supine 90/90 with Leg Extensions  - 1 x daily - 7 x weekly - 2 sets - 10 reps - Supine Hip Internal and External Rotation  - 1 x daily - 7 x weekly - 2 sets - 10 reps - Bird Dog  - 1 x daily - 7 x weekly - 2 sets - 10 reps - Seated Scapular Retraction  -  1 x daily - 7 x weekly - 2 sets - 10 reps - 5 sec hold - Seated Cervical Retraction  - 1 x daily - 7 x weekly - 2 sets - 10 reps  ASSESSMENT:  CLINICAL IMPRESSION: Patient is a 68 y.o. male who was seen today for physical therapy evaluation and treatment for lumbar radiculopathy. Patient with an extensive PMH, including a left sided microdiskectomy in 2015.  Patient has tried multiple treatment options, but has had little improvements in his pain.  Patient is scheduled for another lumbar injection next week to assess if it will help decrease his pain.  Patient presents with increased pain, hip and core weakness, difficulty standing without pain, abnormal posture, decreased balance, and difficulty performing functional tasks without increased pain.  Patient would benefit from skilled PT to address his functional impairments.  OBJECTIVE IMPAIRMENTS: Abnormal gait, decreased balance, difficulty walking, decreased strength, increased muscle spasms, impaired flexibility, impaired sensation, improper body mechanics, postural dysfunction, and pain.   ACTIVITY LIMITATIONS: lifting,  standing, squatting, and locomotion level  PARTICIPATION LIMITATIONS: cleaning, community activity, and yard work  PERSONAL FACTORS: Past/current experiences, Time since onset of injury/illness/exacerbation, and 3+ comorbidities: left sided microdiskectomy in 2015, bilateral shoulder arthoplasty, bilateral knee arthroplasty are also affecting patient's functional outcome.   REHAB POTENTIAL: Good  CLINICAL DECISION MAKING: Evolving/moderate complexity  EVALUATION COMPLEXITY: Moderate   GOALS: Goals reviewed with patient? Yes  SHORT TERM GOALS: Target date: 04/18/2024  Patient will be independent with initial HEP. Baseline: Goal status: INITIAL  2.  Patient will report at least a 30% improvement in symptoms since starting PT. Baseline:  Goal status: INITIAL   LONG TERM GOALS: Target date: 05/23/2024  Patient will be independent with advanced HEP to allow for self progression after discharge. Baseline:  Goal status: INITIAL  2.  Patient will increase modified Oswestry to no greater than 25% to demonstrate improvements in functional mobility. Baseline: 40% Goal status: INITIAL  3.  Patient will improve single leg stance to at least 15 seconds to decrease risk of falling. Baseline: less than 5 seconds Goal status: INITIAL  4.  Patient will increase bilateral hip strength and core strength to [facility] to allow him to perform lifting and other tasks without difficulty. Baseline:  Goal status: INITIAL  5.  Patient will report ability to perform yard work without increased pain. Baseline:  Goal status: INITIAL  6.  Patient will demonstrate proper posture and body mechanics with lifting activities during PT session. Baseline:  Goal status: INITIAL  PLAN:  PT FREQUENCY: 2x/week  PT DURATION: 8 weeks  PLANNED INTERVENTIONS: [id number]- PT Re-evaluation, 97750- Physical Performance Testing, 97110-Therapeutic exercises, 97530- Therapeutic activity, [id number]- Neuromuscular  re-education, 97535- Self Care, [id number]- Manual therapy, [id number]- Gait training, [phone]- Canalith repositioning, [id number]- Aquatic Therapy, [phone]- Electrical stimulation (unattended), [phone]- Electrical stimulation (manual), [id number]- Vasopneumatic device, [id number]- Ultrasound, [id number]- Traction (mechanical), [id number]- Ionotophoresis 4mg /ml Dexamethasone , [id number] (1-2 muscles), [id number] (3+ muscles)- Dry Needling, Patient/Family education, Balance training, Stair training, Taping, Joint mobilization, Joint manipulation, Spinal manipulation, Spinal mobilization, Cryotherapy, and Moist heat.  PLAN FOR NEXT SESSION: Assess and progress HEP as indicated, strengthening, flexibility, manual/dry needling as indicated    Jarrell Laming, PT, DPT 04/02/24, 1:27 PM  [facility] [address] [place], [place] [address] Phone # [phone] Fax [phone]

## 2024-04-08 ENCOUNTER — Ambulatory Visit

## 2024-04-08 DIAGNOSIS — M6281 Muscle weakness (generalized): Secondary | ICD-10-CM | POA: Diagnosis not present

## 2024-04-08 DIAGNOSIS — R252 Cramp and spasm: Secondary | ICD-10-CM

## 2024-04-08 DIAGNOSIS — M5459 Other low back pain: Secondary | ICD-10-CM | POA: Diagnosis not present

## 2024-04-08 DIAGNOSIS — R2689 Other abnormalities of gait and mobility: Secondary | ICD-10-CM

## 2024-04-08 DIAGNOSIS — R293 Abnormal posture: Secondary | ICD-10-CM | POA: Diagnosis not present

## 2024-04-08 NOTE — Therapy (Signed)
 OUTPATIENT PHYSICAL THERAPY TREATMENT   Patient Name: ASUNCION SHIBATA MRN: [id number] DOB:1956-04-02, 68 y.o., male Today's Date: 04/08/2024  END OF SESSION:  PT End of Session - 04/08/24 0852     Visit Number 2    Date for Recertification  05/23/24    Authorization Type BC/BS Medicare/[employer] [place]    Progress Note Due on Visit 10    PT Start Time 0753    PT Stop Time 0855    PT Time Calculation (min) 62 min    Activity Tolerance Patient tolerated treatment well    Behavior During Therapy [facility] for tasks assessed/performed           Past Medical History:  Diagnosis Date   Anxiety    Arthritis    all over (03/06/2018)   Close exposure to COVID-19 virus 07/08/2019   GERD (gastroesophageal reflux disease)    Gout    on daily RX (03/06/2018)   History of kidney stones     passed x 1   Hypertension    Hypertension 12/25/2016   Loosening of total shoulder replacement    left   Low testosterone  12/25/2016   Migraine    haven't had more than 1/year since propanolol started (03/06/2018)   Propionibacterium infection 08/28/2019   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   Rash 09/17/2019   Situational depression    recently lost my mom and brother (03/06/2018)   Wears contact lenses    Wears glasses    Past Surgical History:  Procedure Laterality Date   BACK SURGERY     COLONOSCOPY     COLONOSCOPY W/ BIOPSIES AND POLYPECTOMY     EXCISIONAL TOTAL SHOULDER ARTHROPLASTY WITH ANTIBIOTIC SPACER Left 03/06/2018   Procedure: Left shoulder antibiotic spacer expalnt with glenoid bone grafting, antiobiotic spacer placement;  Surgeon: Sharl Selinda Dover, MD;  Location: [facility] OR;  Service: Orthopedics;  Laterality: Left;   EXCISIONAL TOTAL SHOULDER ARTHROPLASTY WITH ANTIBIOTIC SPACER Left 01/06/2020   Procedure: LEFT SHOULDER ARTHROPLASTY EXPLANT WITH ANTIBIOTIC SPACER PLACEMENT;  Surgeon: Sharl Selinda Dover, MD;  Location: [facility] OR;  Service: Orthopedics;  Laterality: Left;  2.5  hrs RNFA   IR FLUORO GUIDE CV LINE RIGHT  12/26/2016   IRRIGATION AND DEBRIDEMENT SHOULDER Left 05/27/2020   Procedure: Left shoulder antibiotic spacer exchange;  Surgeon: Sharl Selinda Dover, MD;  Location: [facility] OR;  Service: Orthopedics;  Laterality: Left;  120 mins   KNEE ARTHROSCOPY Bilateral    X 10 total arthroscopies   KNEE CARTILAGE SURGERY Left 1977   cartilage removed   LASIK Bilateral    LUMBAR DISC SURGERY  2000s   ruptured disc   REVERSE SHOULDER ARTHROPLASTY Left 12/08/2016   Procedure: Revision left total shoulder to reverse total shoulder arthroplasty;  Surgeon: Sharl Selinda Dover, MD;  Location: [facility] OR;  Service: Orthopedics;  Laterality: Left;   REVERSE SHOULDER ARTHROPLASTY Left 01/01/2018   Procedure: Left reverse shouler explant with antibiotic spacer placement;  Surgeon: Sharl Selinda Dover, MD;  Location: [facility] ORS;  Service: Orthopedics;  Laterality: Left;  2.5 hrs   REVERSE SHOULDER ARTHROPLASTY Left 06/11/2018   Procedure: REVERSE TOTAL SHOULDER REVISION;  Surgeon: Sharl Selinda Dover, MD;  Location: [facility] OR;  Service: Orthopedics;  Laterality: Left;   REVERSE SHOULDER ARTHROPLASTY Left 12/05/2018   Procedure: Left reverse shoulder explant, bone grafting;  Surgeon: Sharl Selinda Dover, MD;  Location: [facility] OR;  Service: Orthopedics;  Laterality: Left;  2.5 hrs   REVISION OF TOTAL SHOULDER Left 12/26/2018   REVISION TOTAL SHOULDER  TO REVERSE TOTAL SHOULDER Left 07/16/2018   Procedure: Left shoulder irrigation and debridement with poly and head ball exchange;  Surgeon: Sharl Selinda Dover, MD;  Location: [facility] OR;  Service: Orthopedics;  Laterality: Left;  2 hrs   SHOULDER ARTHROSCOPY Right 1980s/1990s   bone spur removed   SHOULDER OPEN ROTATOR CUFF REPAIR Right 1970s   SHOULDER SURGERY Left 03/06/2018   antibiotic spacer expalnt with glenoid bone grafting, antiobiotic spacer placement   TOTAL KNEE ARTHROPLASTY Bilateral    TOTAL SHOULDER ARTHROPLASTY Left ~  2014-2017 X 2   in [place]   TOTAL SHOULDER REPLACEMENT Right ~ 2009   in [place]   TOTAL SHOULDER REVISION Left 12/26/2018   Procedure: Left prosthetic shoulder 1 component revision;  Surgeon: Sharl Selinda Dover, MD;  Location: [facility] OR;  Service: Orthopedics;  Laterality: Left;  90 mins   WISDOM TOOTH EXTRACTION     Patient Active Problem List   Diagnosis Date Noted   Dyslipidemia 09/05/2022   Melanoma in situ (HCC) 09/04/2022   Polyarthralgia 03/21/2022   Hypothyroidism 03/21/2022   Seasonal allergies 03/21/2022   GERD (gastroesophageal reflux disease) 03/21/2022   Anxiety 12/09/2019   Hypertension 12/25/2016   Low testosterone  12/25/2016   Gout 12/25/2016   Lumbar radiculopathy 07/05/2013    PCP: Kennyth Worth HERO, MD  REFERRING PROVIDER: Orlean Suzen Lacks, NP  REFERRING DIAG: M54.16 (ICD-10-CM) - Radiculopathy, lumbar region  Rationale for Evaluation and Treatment: Rehabilitation  THERAPY DIAG:  Other low back pain  Other abnormalities of gait and mobility  Cramp and spasm  Muscle weakness (generalized)  Abnormal posture  ONSET DATE: chronic, but worse in the past 3 months  SUBJECTIVE:                                                                                                                                                                                           SUBJECTIVE STATEMENT: I'm feeling pretty good today.  I've had good days and bad days.    Eval: Pt has been having back pain that has been debilitating in nature.  He was referred from his PCP to a Neurosurgeon and was given an injection.  On his visit with Dr Kennyth, he was given another round of oral Prednisone  in an attempt to decrease his pain.  Patient states that he has tried acupuncture and chiropractor.  Patient reports that he enjoys playing golf, but has not been able to play in the past 3 months.  PERTINENT HISTORY:  Polyarthralgia, HTN, Anxiety, gout, Left total shoulder  arthroplasty (multiple revisions), right total shoulder arthroplasty, bilateral TKA, left-sided microdiscectomy at L5-S1 in 2015  PAIN:  04/08/24 Are you having pain? Yes: NPRS scale: 3/10 Pain location: back and radiating down into bilat hips Pain description: tightness and numbness in leg Aggravating factors: standing Relieving factors: sitting  PRECAUTIONS: None  RED FLAGS: None   WEIGHT BEARING RESTRICTIONS: No  FALLS:  Has patient fallen in last 6 months? No  LIVING ENVIRONMENT: Lives with: lives alone Lives in: House/apartment Stairs: one level home Has following equipment at home: None  OCCUPATION: retired  PLOF: Independent and Leisure: yard work, exercising at [employer], I just like to be active  PATIENT GOALS: To be able to do as much as I can without pain.  NEXT MD VISIT: lumbar injection on 04/10/2024, then will schedule follow up  OBJECTIVE:  Note: Objective measures were completed at Evaluation unless otherwise noted.  DIAGNOSTIC FINDINGS:  Lumbar MRI on 02/01/2024: IMPRESSION: 1. 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 Multifactorial spinal stenosis throughout the region that could be symptomatic. 2. L3-4: 2 mm retrolisthesis. Facet arthropathy with 2 mm of anterolisthesis. Synovial cyst projecting inward from the facet on the right measuring up to 1 cm in size. Moderate multifactorial stenosis. Lateral recess stenosis right worse than left because of the synovial cyst. Foraminal narrowing right worse than left. Neural compression quite possible at this level, particularly on the right. 3. L4-5: 2 mm degenerative anterolisthesis. Moderate multifactorial canal stenosis, lateral recess stenosis and foraminal stenosis that could be symptomatic. 4. L5-S1: 2 mm degenerative anterolisthesis. Moderate multifactorial stenosis of the canal, lateral recesses and neural foramina that could be  symptomatic. 5. T12-L1 and L1-2: Moderate multifactorial stenosis but without definite neural compression. 6. L2-3: Mild multifactorial stenosis but without definite neural compression.  PATIENT SURVEYS:  Modified Oswestry:  MODIFIED OSWESTRY DISABILITY SCALE  Score Date: 04/02/2024  Pain intensity 3 =  Pain medication provides me with moderate relief from pain.  2. Personal care (washing, dressing, etc.) 2 =  It is painful to take care of myself, and I am slow and careful.  3. Lifting 1 = I can lift heavy weights, but it causes increased pain.  4. Walking 3 =  Pain prevents me from walking more than  mile.  5. Sitting 0 =  I can sit in any chair as long as I like.  6. Standing 5 =  Pain prevents me from standing at all  7. Sleeping 2 =  Even when I take pain medication, I sleep less than 6 hours  8. Social Life 2 = Pain prevents me from participating in more energetic activities (eg. sports, dancing).  9. Traveling 1 =  I can travel anywhere, but it increases my pain.  10. Employment/ Homemaking 1 = My normal homemaking/job activities increase my pain, but I can still perform all that is required of me  Total 20/50 = 40%   Interpretation of scores: Score Category Description  0-20% Minimal Disability The patient can cope with most living activities. Usually no treatment is indicated apart from advice on lifting, sitting and exercise  21-40% Moderate Disability The patient experiences more pain and difficulty with sitting, lifting and standing. Travel and social life are more difficult and they may be disabled from work. Personal care, sexual activity and sleeping are not grossly affected, and the patient can usually be managed by conservative means  41-60% Severe Disability Pain remains the main problem in this group, but activities of daily living are affected. These patients require a detailed investigation  61-80% Crippled  Back pain impinges on all aspects of the patient's life. Positive  intervention is required  81-100% Bed-bound  These patients are either bed-bound or exaggerating their symptoms  Bluford FORBES Zoe DELENA Karon DELENA, et al. Surgery versus conservative management of stable thoracolumbar fracture: the PRESTO feasibility RCT. [place]): [employer]; 2021 Nov. [facility] Technology Assessment, No. 25.62.) Appendix 3, Oswestry Disability Index category descriptors. Available from: FindJewelers.cz  Minimally Clinically Important Difference (MCID) = 12.8%  COGNITION: Overall cognitive status: Within functional limits for tasks assessed     SENSATION: Reports numbness down bilateral legs  MUSCLE LENGTH: Hamstrings: slight tightness bilaterally  POSTURE: rounded shoulders and forward head  PALPATION: Tightness noted throughout paraspinals and upper traps  LUMBAR ROM:   Eval:  WFL  LOWER EXTREMITY ROM:     Eval:  WFL  LOWER EXTREMITY MMT:    Eval: Right hip strength of 4/5 Left hip strength of 4-/5 grossly throughout Bilateral quad and hamstring strength is WFL  LUMBAR SPECIAL TESTS:  Slump test: Negative  FUNCTIONAL TESTS:  Eval: 5 times sit to stand: 10.52 sec Timed up and go (TUG): 7.2 sec Single Leg Stance:  right- 3.35 sec, left- 2.91 sec  GAIT: Distance walked: >500 ft Assistive device utilized: None Level of assistance: Complete Independence Comments: Pt reports pain as he walks increased distances  TREATMENT DATE:  04/08/24:  NuStep: level 5x 6 minutes-PT present to discuss progress  Standing hamstring stretch: 2x20 seconds  Pelvic tilt: 5 hold x10 Bridge with green band 2x10 Sidelying clam with green band 2x10 Pallof press: green x10 Rt and Lt  Sidestepping with green loop around thighs x4 laps  Seated nerve glide x10 Rt and Lt Lumbar mechanical traction: 110#/50# x 10 min intermittent    04/02/2024:  Reviewed HEP and role of PT                                                                                                                                PATIENT EDUCATION:  Education details: Issued HEP Person educated: Patient Education method: Explanation, Demonstration, and Handouts Education comprehension: verbalized understanding and returned demonstration  HOME EXERCISE PROGRAM: Access Code: [email or web address] URL: [email or web address] Date: 04/02/2024 Prepared by: Jarrell Menke  Exercises - Standing Hamstring Stretch with Step  - 1 x daily - 7 x weekly - 2 reps - 20 sec hold - Seated Piriformis Stretch with Trunk Bend  - 1 x daily - 7 x weekly - 2 reps - 20 sec hold - Clamshell with Resistance  - 1 x daily - 7 x weekly - 2 sets - 10 reps - Bridge with Hip Abduction and Resistance  - 1 x daily - 7 x weekly - 2 sets - 10 reps - Supine Posterior Pelvic Tilt  - 1 x daily - 7 x weekly - 2 sets - 10 reps - Supine 90/90 with Leg Extensions  - 1 x daily - 7 x weekly -  2 sets - 10 reps - Supine Hip Internal and External Rotation  - 1 x daily - 7 x weekly - 2 sets - 10 reps - Bird Dog  - 1 x daily - 7 x weekly - 2 sets - 10 reps - Seated Scapular Retraction  - 1 x daily - 7 x weekly - 2 sets - 10 reps - 5 sec hold - Seated Cervical Retraction  - 1 x daily - 7 x weekly - 2 sets - 10 reps  ASSESSMENT:  CLINICAL IMPRESSION: First time follow-up after evaluation.  Pt reports compliance with HEP and with review was performing all aspects correctly.  PT monitored throughout session and he performed all exercises without increased pain. Cueing for core activation and alignment.  Good response to initial traction session and can increase resistance if tolerated next session.  Patient will benefit from skilled PT to address the below impairments and improve overall function.   OBJECTIVE IMPAIRMENTS: Abnormal gait, decreased balance, difficulty walking, decreased strength, increased muscle spasms, impaired flexibility, impaired sensation, improper body mechanics, postural  dysfunction, and pain.   ACTIVITY LIMITATIONS: lifting, standing, squatting, and locomotion level  PARTICIPATION LIMITATIONS: cleaning, community activity, and yard work  PERSONAL FACTORS: Past/current experiences, Time since onset of injury/illness/exacerbation, and 3+ comorbidities: left sided microdiskectomy in 2015, bilateral shoulder arthoplasty, bilateral knee arthroplasty are also affecting patient's functional outcome.   REHAB POTENTIAL: Good  CLINICAL DECISION MAKING: Evolving/moderate complexity  EVALUATION COMPLEXITY: Moderate   GOALS: Goals reviewed with patient? Yes  SHORT TERM GOALS: Target date: 04/18/2024  Patient will be independent with initial HEP. Baseline: Goal status: INITIAL  2.  Patient will report at least a 30% improvement in symptoms since starting PT. Baseline:  Goal status: INITIAL   LONG TERM GOALS: Target date: 05/23/2024  Patient will be independent with advanced HEP to allow for self progression after discharge. Baseline:  Goal status: INITIAL  2.  Patient will increase modified Oswestry to no greater than 25% to demonstrate improvements in functional mobility. Baseline: 40% Goal status: INITIAL  3.  Patient will improve single leg stance to at least 15 seconds to decrease risk of falling. Baseline: less than 5 seconds Goal status: INITIAL  4.  Patient will increase bilateral hip strength and core strength to [facility] to allow him to perform lifting and other tasks without difficulty. Baseline:  Goal status: INITIAL  5.  Patient will report ability to perform yard work without increased pain. Baseline:  Goal status: INITIAL  6.  Patient will demonstrate proper posture and body mechanics with lifting activities during PT session. Baseline:  Goal status: INITIAL  PLAN:  PT FREQUENCY: 2x/week  PT DURATION: 8 weeks  PLANNED INTERVENTIONS: [id number]- PT Re-evaluation, 97750- Physical Performance Testing, 97110-Therapeutic exercises,  97530- Therapeutic activity, [id number]- Neuromuscular re-education, 97535- Self Care, [id number]- Manual therapy, [id number]- Gait training, [phone]- Canalith repositioning, [id number]- Aquatic Therapy, [phone]- Electrical stimulation (unattended), [phone]- Electrical stimulation (manual), [id number]- Vasopneumatic device, [id number]- Ultrasound, [id number]- Traction (mechanical), [id number]- Ionotophoresis 4mg /ml Dexamethasone , 79439 (1-2 muscles), [id number] (3+ muscles)- Dry Needling, Patient/Family education, Balance training, Stair training, Taping, Joint mobilization, Joint manipulation, Spinal manipulation, Spinal mobilization, Cryotherapy, and Moist heat.  PLAN FOR NEXT SESSION: Assess and progress HEP as indicated, strengthening, flexibility, manual/dry needling as indicated. Assess response to dry needling.     Burnard Joy, PT 04/08/24 8:53 AM   [facility] [address], [address] [place], [place] [address] Phone # [phone] Fax [phone]

## 2024-04-09 DIAGNOSIS — G8929 Other chronic pain: Secondary | ICD-10-CM | POA: Diagnosis not present

## 2024-04-09 DIAGNOSIS — M2560 Stiffness of unspecified joint, not elsewhere classified: Secondary | ICD-10-CM | POA: Diagnosis not present

## 2024-04-09 DIAGNOSIS — M1991 Primary osteoarthritis, unspecified site: Secondary | ICD-10-CM | POA: Diagnosis not present

## 2024-04-09 DIAGNOSIS — M109 Gout, unspecified: Secondary | ICD-10-CM | POA: Diagnosis not present

## 2024-04-10 ENCOUNTER — Encounter

## 2024-04-10 DIAGNOSIS — M5416 Radiculopathy, lumbar region: Secondary | ICD-10-CM | POA: Diagnosis not present

## 2024-04-10 DIAGNOSIS — M5415 Radiculopathy, thoracolumbar region: Secondary | ICD-10-CM | POA: Diagnosis not present

## 2024-04-11 ENCOUNTER — Other Ambulatory Visit: Payer: Self-pay | Admitting: Family Medicine

## 2024-04-11 MED ORDER — CELECOXIB 200 MG PO CAPS: 200.0000 mg | ORAL_CAPSULE | Freq: Two times a day (BID) | ORAL | 0 refills | Status: DC

## 2024-04-11 NOTE — Telephone Encounter (Signed)
 Copied from CRM #[id number]. Topic: Clinical - Medication Refill >> Apr 11, 2024  9:53 AM Drema MATSU wrote: Medication: celecoxib  (CELEBREX ) 200 MG capsule   Has the patient contacted their pharmacy? No (Agent: If no, request that the patient contact the pharmacy for the refill. If patient does not wish to contact the pharmacy document the reason why and proceed with request.) no more refills (Agent: If yes, when and what did the pharmacy advise?)  This is the patient's preferred pharmacy:  [facility] [id number] - [place], [place] - [address] [place] [address] Phone: [phone] Fax: [phone]  Is this the correct pharmacy for this prescription? Yes If no, delete pharmacy and type the correct one.   Has the prescription been filled recently? Yes  Is the patient out of the medication? Yes 1 left   Has the patient been seen for an appointment in the last year OR does the patient have an upcoming appointment? Yes  Can we respond through MyChart? Yes  Agent: Please be advised that Rx refills may take up to 3 business days. We ask that you follow-up with your pharmacy.

## 2024-04-15 ENCOUNTER — Encounter: Admitting: Rehabilitative and Restorative Service Providers"

## 2024-04-15 DIAGNOSIS — M9903 Segmental and somatic dysfunction of lumbar region: Secondary | ICD-10-CM | POA: Diagnosis not present

## 2024-04-15 DIAGNOSIS — M51362 Other intervertebral disc degeneration, lumbar region with discogenic back pain and lower extremity pain: Secondary | ICD-10-CM | POA: Diagnosis not present

## 2024-04-15 DIAGNOSIS — M4726 Other spondylosis with radiculopathy, lumbar region: Secondary | ICD-10-CM | POA: Diagnosis not present

## 2024-04-15 DIAGNOSIS — M5416 Radiculopathy, lumbar region: Secondary | ICD-10-CM | POA: Diagnosis not present

## 2024-04-15 DIAGNOSIS — Z6829 Body mass index (BMI) 29.0-29.9, adult: Secondary | ICD-10-CM | POA: Diagnosis not present

## 2024-04-15 DIAGNOSIS — M5116 Intervertebral disc disorders with radiculopathy, lumbar region: Secondary | ICD-10-CM | POA: Diagnosis not present

## 2024-04-16 DIAGNOSIS — E291 Testicular hypofunction: Secondary | ICD-10-CM | POA: Diagnosis not present

## 2024-04-16 DIAGNOSIS — R5383 Other fatigue: Secondary | ICD-10-CM | POA: Diagnosis not present

## 2024-04-16 DIAGNOSIS — R972 Elevated prostate specific antigen [PSA]: Secondary | ICD-10-CM | POA: Diagnosis not present

## 2024-04-17 ENCOUNTER — Ambulatory Visit

## 2024-04-17 DIAGNOSIS — R2689 Other abnormalities of gait and mobility: Secondary | ICD-10-CM | POA: Diagnosis not present

## 2024-04-17 DIAGNOSIS — M6281 Muscle weakness (generalized): Secondary | ICD-10-CM

## 2024-04-17 DIAGNOSIS — R252 Cramp and spasm: Secondary | ICD-10-CM | POA: Diagnosis not present

## 2024-04-17 DIAGNOSIS — R293 Abnormal posture: Secondary | ICD-10-CM

## 2024-04-17 DIAGNOSIS — M5459 Other low back pain: Secondary | ICD-10-CM

## 2024-04-17 NOTE — Therapy (Addendum)
 OUTPATIENT PHYSICAL THERAPY TREATMENT   Patient Name: Geoffrey West MRN: [id number] DOB:[birth year], 68 y.o., male Today's Date: 04/17/2024  END OF SESSION:  PT End of Session - 04/17/24 0949     Visit Number 3    Date for Recertification  05/23/24    Authorization Type BC/BS Medicare/[employer] State    Progress Note Due on Visit 10    PT Start Time 0932    PT Stop Time 1000    PT Time Calculation (min) 28 min    Activity Tolerance Patient tolerated treatment well    Behavior During Therapy [facility] for tasks assessed/performed            Past Medical History:  Diagnosis Date   Anxiety    Arthritis    all over (03/06/2018)   Close exposure to COVID-19 virus 07/08/2019   GERD (gastroesophageal reflux disease)    Gout    on daily RX (03/06/2018)   History of kidney stones     passed x 1   Hypertension    Hypertension 12/25/2016   Loosening of total shoulder replacement    left   Low testosterone  12/25/2016   Migraine    haven't had more than 1/year since propanolol started (03/06/2018)   Propionibacterium infection 08/28/2019   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   Rash 09/17/2019   Situational depression    recently lost my mom and brother (03/06/2018)   Wears contact lenses    Wears glasses    Past Surgical History:  Procedure Laterality Date   BACK SURGERY     COLONOSCOPY     COLONOSCOPY W/ BIOPSIES AND POLYPECTOMY     EXCISIONAL TOTAL SHOULDER ARTHROPLASTY WITH ANTIBIOTIC SPACER Left 03/06/2018   Procedure: Left shoulder antibiotic spacer expalnt with glenoid bone grafting, antiobiotic spacer placement;  Surgeon: Sharl Selinda Dover, MD;  Location: [facility] OR;  Service: Orthopedics;  Laterality: Left;   EXCISIONAL TOTAL SHOULDER ARTHROPLASTY WITH ANTIBIOTIC SPACER Left 01/06/2020   Procedure: LEFT SHOULDER ARTHROPLASTY EXPLANT WITH ANTIBIOTIC SPACER PLACEMENT;  Surgeon: Sharl Selinda Dover, MD;  Location: [facility] OR;  Service: Orthopedics;  Laterality: Left;  2.5  hrs RNFA   IR FLUORO GUIDE CV LINE RIGHT  12/26/2016   IRRIGATION AND DEBRIDEMENT SHOULDER Left 05/27/2020   Procedure: Left shoulder antibiotic spacer exchange;  Surgeon: Sharl Selinda Dover, MD;  Location: [facility] OR;  Service: Orthopedics;  Laterality: Left;  120 mins   KNEE ARTHROSCOPY Bilateral    X 10 total arthroscopies   KNEE CARTILAGE SURGERY Left 1977   cartilage removed   LASIK Bilateral    LUMBAR DISC SURGERY  2000s   ruptured disc   REVERSE SHOULDER ARTHROPLASTY Left 12/08/2016   Procedure: Revision left total shoulder to reverse total shoulder arthroplasty;  Surgeon: Sharl Selinda Dover, MD;  Location: [facility] OR;  Service: Orthopedics;  Laterality: Left;   REVERSE SHOULDER ARTHROPLASTY Left 01/01/2018   Procedure: Left reverse shouler explant with antibiotic spacer placement;  Surgeon: Sharl Selinda Dover, MD;  Location: [facility] ORS;  Service: Orthopedics;  Laterality: Left;  2.5 hrs   REVERSE SHOULDER ARTHROPLASTY Left 06/11/2018   Procedure: REVERSE TOTAL SHOULDER REVISION;  Surgeon: Sharl Selinda Dover, MD;  Location: [facility] OR;  Service: Orthopedics;  Laterality: Left;   REVERSE SHOULDER ARTHROPLASTY Left 12/05/2018   Procedure: Left reverse shoulder explant, bone grafting;  Surgeon: Sharl Selinda Dover, MD;  Location: [facility] OR;  Service: Orthopedics;  Laterality: Left;  2.5 hrs   REVISION OF TOTAL SHOULDER Left 12/26/2018   REVISION TOTAL  SHOULDER TO REVERSE TOTAL SHOULDER Left 07/16/2018   Procedure: Left shoulder irrigation and debridement with poly and head ball exchange;  Surgeon: Sharl Selinda Dover, MD;  Location: [facility] OR;  Service: Orthopedics;  Laterality: Left;  2 hrs   SHOULDER ARTHROSCOPY Right 1980s/1990s   bone spur removed   SHOULDER OPEN ROTATOR CUFF REPAIR Right 1970s   SHOULDER SURGERY Left 03/06/2018   antibiotic spacer expalnt with glenoid bone grafting, antiobiotic spacer placement   TOTAL KNEE ARTHROPLASTY Bilateral    TOTAL SHOULDER ARTHROPLASTY Left ~  2014-2017 X 2   in [place]   TOTAL SHOULDER REPLACEMENT Right ~ 2009   in [place]   TOTAL SHOULDER REVISION Left 12/26/2018   Procedure: Left prosthetic shoulder 1 component revision;  Surgeon: Sharl Selinda Dover, MD;  Location: [facility] OR;  Service: Orthopedics;  Laterality: Left;  90 mins   WISDOM TOOTH EXTRACTION     Patient Active Problem List   Diagnosis Date Noted   Dyslipidemia 09/05/2022   Melanoma in situ (HCC) 09/04/2022   Polyarthralgia 03/21/2022   Hypothyroidism 03/21/2022   Seasonal allergies 03/21/2022   GERD (gastroesophageal reflux disease) 03/21/2022   Anxiety 12/09/2019   Hypertension 12/25/2016   Low testosterone  12/25/2016   Gout 12/25/2016   Lumbar radiculopathy 07/05/2013    PCP: Kennyth Worth HERO, MD  REFERRING PROVIDER: Orlean Suzen Lacks, NP  REFERRING DIAG: M54.16 (ICD-10-CM) - Radiculopathy, lumbar region  Rationale for Evaluation and Treatment: Rehabilitation  THERAPY DIAG:  Other low back pain  Other abnormalities of gait and mobility  Cramp and spasm  Muscle weakness (generalized)  Abnormal posture  ONSET DATE: chronic, but worse in the past 3 months  SUBJECTIVE:                                                                                                                                                                                           SUBJECTIVE STATEMENT: I got the shots in my back last week.  I had numbness in my legs after and now I am on prednisone .  I started a decompression program at the chiropractor and will do this for 30 visit.  I feel pretty good now but don't know what to contribute that to.     Eval: Pt has been having back pain that has been debilitating in nature.  He was referred from his PCP to a Neurosurgeon and was given an injection.  On his visit with Dr Kennyth, he was given another round of oral Prednisone  in an attempt to decrease his pain.  Patient states that he has tried acupuncture and  chiropractor.  Patient reports that he enjoys  playing golf, but has not been able to play in the past 3 months.  PERTINENT HISTORY:  Polyarthralgia, HTN, Anxiety, gout, Left total shoulder arthroplasty (multiple revisions), right total shoulder arthroplasty, bilateral TKA, left-sided microdiscectomy at L5-S1 in 2015  PAIN: 04/16/24 Are you having pain? Yes: NPRS scale: 1-2/10 Pain location: back and radiating down into bilat hips Pain description: tightness and numbness in leg Aggravating factors: standing Relieving factors: sitting  PRECAUTIONS: None  RED FLAGS: None   WEIGHT BEARING RESTRICTIONS: No  FALLS:  Has patient fallen in last 6 months? No  LIVING ENVIRONMENT: Lives with: lives alone Lives in: House/apartment Stairs: one level home Has following equipment at home: None  OCCUPATION: retired  PLOF: Independent and Leisure: yard work, exercising at [employer], I just like to be active  PATIENT GOALS: To be able to do as much as I can without pain.  NEXT MD VISIT: lumbar injection on 04/10/2024, then will schedule follow up  OBJECTIVE:  Note: Objective measures were completed at Evaluation unless otherwise noted.  DIAGNOSTIC FINDINGS:  Lumbar MRI on 02/01/2024: IMPRESSION: 1. 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 Multifactorial spinal stenosis throughout the region that could be symptomatic. 2. L3-4: 2 mm retrolisthesis. Facet arthropathy with 2 mm of anterolisthesis. Synovial cyst projecting inward from the facet on the right measuring up to 1 cm in size. Moderate multifactorial stenosis. Lateral recess stenosis right worse than left because of the synovial cyst. Foraminal narrowing right worse than left. Neural compression quite possible at this level, particularly on the right. 3. L4-5: 2 mm degenerative anterolisthesis. Moderate multifactorial canal stenosis, lateral recess stenosis and foraminal  stenosis that could be symptomatic. 4. L5-S1: 2 mm degenerative anterolisthesis. Moderate multifactorial stenosis of the canal, lateral recesses and neural foramina that could be symptomatic. 5. T12-L1 and L1-2: Moderate multifactorial stenosis but without definite neural compression. 6. L2-3: Mild multifactorial stenosis but without definite neural compression.  PATIENT SURVEYS:  Modified Oswestry:  MODIFIED OSWESTRY DISABILITY SCALE  Score Date: 04/02/2024  Pain intensity 3 =  Pain medication provides me with moderate relief from pain.  2. Personal care (washing, dressing, etc.) 2 =  It is painful to take care of myself, and I am slow and careful.  3. Lifting 1 = I can lift heavy weights, but it causes increased pain.  4. Walking 3 =  Pain prevents me from walking more than  mile.  5. Sitting 0 =  I can sit in any chair as long as I like.  6. Standing 5 =  Pain prevents me from standing at all  7. Sleeping 2 =  Even when I take pain medication, I sleep less than 6 hours  8. Social Life 2 = Pain prevents me from participating in more energetic activities (eg. sports, dancing).  9. Traveling 1 =  I can travel anywhere, but it increases my pain.  10. Employment/ Homemaking 1 = My normal homemaking/job activities increase my pain, but I can still perform all that is required of me  Total 20/50 = 40%   Interpretation of scores: Score Category Description  0-20% Minimal Disability The patient can cope with most living activities. Usually no treatment is indicated apart from advice on lifting, sitting and exercise  21-40% Moderate Disability The patient experiences more pain and difficulty with sitting, lifting and standing. Travel and social life are more difficult and they may be disabled from work. Personal care, sexual activity and sleeping  are not grossly affected, and the patient can usually be managed by conservative means  41-60% Severe Disability Pain remains the main problem in this  group, but activities of daily living are affected. These patients require a detailed investigation  61-80% Crippled Back pain impinges on all aspects of the patient's life. Positive intervention is required  81-100% Bed-bound  These patients are either bed-bound or exaggerating their symptoms  Bluford FORBES Zoe DELENA Karon DELENA, et al. Surgery versus conservative management of stable thoracolumbar fracture: the PRESTO feasibility RCT. [place] ([place]): [employer]; 2021 Nov. [facility] Technology Assessment, No. 25.62.) Appendix 3, Oswestry Disability Index category descriptors. Available from: Findjewelers.cz  Minimally Clinically Important Difference (MCID) = 12.8%  COGNITION: Overall cognitive status: Within functional limits for tasks assessed     SENSATION: Reports numbness down bilateral legs  MUSCLE LENGTH: Hamstrings: slight tightness bilaterally  POSTURE: rounded shoulders and forward head  PALPATION: Tightness noted throughout paraspinals and upper traps  LUMBAR ROM:   Eval:  WFL  LOWER EXTREMITY ROM:     Eval:  WFL  LOWER EXTREMITY MMT:    Eval: Right hip strength of 4/5 Left hip strength of 4-/5 grossly throughout Bilateral quad and hamstring strength is WFL  LUMBAR SPECIAL TESTS:  Slump test: Negative  FUNCTIONAL TESTS:  Eval: 5 times sit to stand: 10.52 sec Timed up and go (TUG): 7.2 sec Single Leg Stance:  right- 3.35 sec, left- 2.91 sec  GAIT: Distance walked: >500 ft Assistive device utilized: None Level of assistance: Complete Independence Comments: Pt reports pain as he walks increased distances  TREATMENT DATE:   04/17/24:  Lumbar mechanical traction: 110#/50# x 10 min intermittent   04/08/24:  NuStep: level 5x 6 minutes-PT present to discuss progress  Standing hamstring stretch: 2x20 seconds  Pelvic tilt: 5 hold x10 Bridge with green band 2x10 Sidelying clam with green band 2x10 Pallof press: green  x10 Rt and Lt  Sidestepping with green loop around thighs x4 laps  Seated nerve glide x10 Rt and Lt  04/02/2024:  Reviewed HEP and role of PT                                                                                                                              PATIENT EDUCATION:  Education details: Issued HEP Person educated: Patient Education method: Explanation, Demonstration, and Handouts Education comprehension: verbalized understanding and returned demonstration  HOME EXERCISE PROGRAM: Access Code: [email or web address] URL: [email or web address] Date: 04/02/2024 Prepared by: Jarrell Menke  Exercises - Standing Hamstring Stretch with Step  - 1 x daily - 7 x weekly - 2 reps - 20 sec hold - Seated Piriformis Stretch with Trunk Bend  - 1 x daily - 7 x weekly - 2 reps - 20 sec hold - Clamshell with Resistance  - 1 x daily - 7 x weekly - 2 sets - 10 reps - Bridge with Hip Abduction and Resistance  - 1 x daily - 7  x weekly - 2 sets - 10 reps - Supine Posterior Pelvic Tilt  - 1 x daily - 7 x weekly - 2 sets - 10 reps - Supine 90/90 with Leg Extensions  - 1 x daily - 7 x weekly - 2 sets - 10 reps - Supine Hip Internal and External Rotation  - 1 x daily - 7 x weekly - 2 sets - 10 reps - Bird Dog  - 1 x daily - 7 x weekly - 2 sets - 10 reps - Seated Scapular Retraction  - 1 x daily - 7 x weekly - 2 sets - 10 reps - 5 sec hold - Seated Cervical Retraction  - 1 x daily - 7 x weekly - 2 sets - 10 reps  ASSESSMENT:  CLINICAL IMPRESSION: Pt is now seeing a chiropractor for a decompression program x 30 visits. PT discussed that traction is the same mechanism and he does not need to do both to duplicate services.  He is independent in current HEP and does not need further advancement at this time.  He tolerated advanced traction pull today.  Pt will be placed on hold.    OBJECTIVE IMPAIRMENTS: Abnormal gait, decreased balance, difficulty walking, decreased strength, increased muscle  spasms, impaired flexibility, impaired sensation, improper body mechanics, postural dysfunction, and pain.   ACTIVITY LIMITATIONS: lifting, standing, squatting, and locomotion level  PARTICIPATION LIMITATIONS: cleaning, community activity, and yard work  PERSONAL FACTORS: Past/current experiences, Time since onset of injury/illness/exacerbation, and 3+ comorbidities: left sided microdiskectomy in 2015, bilateral shoulder arthoplasty, bilateral knee arthroplasty are also affecting patient's functional outcome.   REHAB POTENTIAL: Good  CLINICAL DECISION MAKING: Evolving/moderate complexity  EVALUATION COMPLEXITY: Moderate   GOALS: Goals reviewed with patient? Yes  SHORT TERM GOALS: Target date: 04/18/2024  Patient will be independent with initial HEP. Baseline: Goal status: INITIAL  2.  Patient will report at least a 30% improvement in symptoms since starting PT. Baseline:  Goal status: INITIAL   LONG TERM GOALS: Target date: 05/23/2024  Patient will be independent with advanced HEP to allow for self progression after discharge. Baseline:  Goal status: INITIAL  2.  Patient will increase modified Oswestry to no greater than 25% to demonstrate improvements in functional mobility. Baseline: 40% Goal status: INITIAL  3.  Patient will improve single leg stance to at least 15 seconds to decrease risk of falling. Baseline: less than 5 seconds Goal status: INITIAL  4.  Patient will increase bilateral hip strength and core strength to [facility] to allow him to perform lifting and other tasks without difficulty. Baseline:  Goal status: INITIAL  5.  Patient will report ability to perform yard work without increased pain. Baseline:  Goal status: INITIAL  6.  Patient will demonstrate proper posture and body mechanics with lifting activities during PT session. Baseline:  Goal status: INITIAL  PLAN:  PT FREQUENCY: 2x/week  PT DURATION: 8 weeks  PLANNED INTERVENTIONS: [id number]- PT  Re-evaluation, 97750- Physical Performance Testing, 97110-Therapeutic exercises, 97530- Therapeutic activity, [id number]- Neuromuscular re-education, 97535- Self Care, [id number]- Manual therapy, [id number]- Gait training, [id number]- Canalith repositioning, [id number]- Aquatic Therapy, [phone]- Electrical stimulation (unattended), [phone]- Electrical stimulation (manual), [id number]- Vasopneumatic device, [id number]- Ultrasound, [id number]- Traction (mechanical), [id number]- Ionotophoresis 4mg /ml Dexamethasone , [id number] (1-2 muscles), [id number] (3+ muscles)- Dry Needling, Patient/Family education, Balance training, Stair training, Taping, Joint mobilization, Joint manipulation, Spinal manipulation, Spinal mobilization, Cryotherapy, and Moist heat.  PLAN FOR NEXT SESSION: hold chart until 05/23/24    Burnard Joy, PT 04/17/24 9:57 AM  PHYSICAL  THERAPY DISCHARGE SUMMARY  Visits from Start of Care: 3  Current functional level related to goals / functional outcomes: See above for current PT status.  Pt plans to complete sessions at chiropractor.    Remaining deficits: See above.    Education / Equipment: HEP   Patient agrees to discharge. Patient goals were not met. Patient is being discharged due to the patient's request. Burnard Joy, PT 05/21/24 7:46 AM   [facility] [address] [place], [place] [address] Phone # [phone] Fax [phone]

## 2024-04-18 DIAGNOSIS — M2550 Pain in unspecified joint: Secondary | ICD-10-CM | POA: Diagnosis not present

## 2024-04-18 DIAGNOSIS — E291 Testicular hypofunction: Secondary | ICD-10-CM | POA: Diagnosis not present

## 2024-04-18 DIAGNOSIS — G479 Sleep disorder, unspecified: Secondary | ICD-10-CM | POA: Diagnosis not present

## 2024-04-18 DIAGNOSIS — E039 Hypothyroidism, unspecified: Secondary | ICD-10-CM | POA: Diagnosis not present

## 2024-04-26 ENCOUNTER — Encounter (HOSPITAL_BASED_OUTPATIENT_CLINIC_OR_DEPARTMENT_OTHER): Payer: Self-pay

## 2024-04-26 ENCOUNTER — Emergency Department (HOSPITAL_BASED_OUTPATIENT_CLINIC_OR_DEPARTMENT_OTHER)
Admission: EM | Admit: 2024-04-26 | Discharge: 2024-04-26 | Disposition: A | Discharge status: Home / Self Care | Attending: Emergency Medicine | Admitting: Emergency Medicine

## 2024-04-26 ENCOUNTER — Other Ambulatory Visit: Payer: Self-pay

## 2024-04-26 DIAGNOSIS — Z96653 Presence of artificial knee joint, bilateral: Secondary | ICD-10-CM | POA: Insufficient documentation

## 2024-04-26 DIAGNOSIS — T50905A Adverse effect of unspecified drugs, medicaments and biological substances, initial encounter: Secondary | ICD-10-CM

## 2024-04-26 DIAGNOSIS — Z91148 Patient's other noncompliance with medication regimen for other reason: Secondary | ICD-10-CM | POA: Insufficient documentation

## 2024-04-26 DIAGNOSIS — T50995A Adverse effect of other drugs, medicaments and biological substances, initial encounter: Secondary | ICD-10-CM | POA: Diagnosis not present

## 2024-04-26 DIAGNOSIS — I10 Essential (primary) hypertension: Secondary | ICD-10-CM | POA: Diagnosis not present

## 2024-04-26 DIAGNOSIS — Z96611 Presence of right artificial shoulder joint: Secondary | ICD-10-CM | POA: Insufficient documentation

## 2024-04-26 DIAGNOSIS — Z96612 Presence of left artificial shoulder joint: Secondary | ICD-10-CM | POA: Diagnosis not present

## 2024-04-26 DIAGNOSIS — Z711 Person with feared health complaint in whom no diagnosis is made: Secondary | ICD-10-CM | POA: Diagnosis not present

## 2024-04-26 DIAGNOSIS — G8928 Other chronic postprocedural pain: Secondary | ICD-10-CM | POA: Diagnosis not present

## 2024-04-26 DIAGNOSIS — G8929 Other chronic pain: Secondary | ICD-10-CM | POA: Diagnosis not present

## 2024-04-26 NOTE — Discharge Instructions (Signed)
 You were evaluated in the Emergency Department and after careful evaluation, we did not find any emergent condition requiring admission or further testing in the hospital.  Your exam/testing today is overall reassuring.  Recommend stopping the naltrexone and discussing your medications with your primary care doctor.  Please return to the Emergency Department if you experience any worsening of your condition.   Thank you for allowing us  to be a part of your care.

## 2024-04-26 NOTE — ED Triage Notes (Signed)
 Pt states that he took a new peptide today, naltrexone, and he read that it is not suppose to be taken with opioids and he takes tramadol  for sciatica and is concerned. He denies any physical complaints at this time, but just wants to make sure he is not at risk for any serious side effects. Denies CP/SOB

## 2024-04-26 NOTE — ED Provider Notes (Signed)
 [facility] Emergency Department Provider Note MRN:  [id number]  Arrival date & time: 04/26/24     Chief Complaint   Medication Reaction   History of Present Illness   Geoffrey West is a 68 y.o. year-old male with a history of GERD, hypertension, chronic pain presenting to the ED with chief complaint of medication reaction.  Patient concerned that his medications are interacting.  He takes tramadol  for chronic pain.  Recently prescribed naltrexone low-dose to help with pain as well but has read that he should not be taking these medicines at the same time.  Here for assistance.  Denies any symptoms.  Review of Systems  A thorough review of systems was obtained and all systems are negative except as noted in the HPI and PMH.   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   Rash 09/17/2019   Situational depression    recently lost my mom and brother (03/06/2018)   Wears contact lenses    Wears glasses     Past Surgical History:  Procedure Laterality Date   BACK SURGERY     COLONOSCOPY     COLONOSCOPY W/ BIOPSIES AND POLYPECTOMY     EXCISIONAL TOTAL SHOULDER ARTHROPLASTY WITH ANTIBIOTIC SPACER Left 03/06/2018   Procedure: Left shoulder antibiotic spacer expalnt with glenoid bone grafting, antiobiotic spacer placement;  Surgeon: Sharl Selinda Dover, MD;  Location: [facility] OR;  Service: Orthopedics;  Laterality: Left;   EXCISIONAL TOTAL  SHOULDER ARTHROPLASTY WITH ANTIBIOTIC SPACER Left 01/06/2020   Procedure: LEFT SHOULDER ARTHROPLASTY EXPLANT WITH ANTIBIOTIC SPACER PLACEMENT;  Surgeon: Sharl Selinda Dover, MD;  Location: [facility] OR;  Service: Orthopedics;  Laterality: Left;  2.5 hrs RNFA   IR FLUORO GUIDE CV LINE RIGHT  12/26/2016   IRRIGATION AND DEBRIDEMENT SHOULDER Left 05/27/2020   Procedure: Left shoulder antibiotic spacer exchange;  Surgeon: Sharl Selinda Dover, MD;  Location: [facility] OR;  Service: Orthopedics;  Laterality: Left;  120 mins   KNEE ARTHROSCOPY Bilateral    X 10 total arthroscopies   KNEE CARTILAGE SURGERY Left 1977   cartilage removed   LASIK Bilateral    LUMBAR DISC SURGERY  2000s   ruptured disc   REVERSE SHOULDER ARTHROPLASTY Left 12/08/2016   Procedure: Revision left total shoulder to reverse total shoulder arthroplasty;  Surgeon: Sharl Selinda Dover, MD;  Location: [facility] OR;  Service: Orthopedics;  Laterality: Left;   REVERSE SHOULDER ARTHROPLASTY Left 01/01/2018   Procedure: Left reverse shouler explant with antibiotic spacer placement;  Surgeon: Sharl Selinda Dover, MD;  Location: [facility] ORS;  Service: Orthopedics;  Laterality: Left;  2.5 hrs   REVERSE SHOULDER ARTHROPLASTY Left 06/11/2018   Procedure: REVERSE TOTAL SHOULDER REVISION;  Surgeon: Sharl Selinda Dover, MD;  Location: [facility] OR;  Service: Orthopedics;  Laterality: Left;   REVERSE SHOULDER ARTHROPLASTY Left 12/05/2018   Procedure: Left reverse shoulder explant, bone grafting;  Surgeon: Sharl,  Selinda Dover, MD;  Location: [facility] OR;  Service: Orthopedics;  Laterality: Left;  2.5 hrs   REVISION OF TOTAL SHOULDER Left 12/26/2018   REVISION TOTAL SHOULDER TO REVERSE TOTAL SHOULDER Left 07/16/2018   Procedure: Left shoulder irrigation and debridement with poly and head ball exchange;  Surgeon: Sharl Selinda Dover, MD;  Location: [facility] OR;  Service: Orthopedics;  Laterality: Left;  2 hrs   SHOULDER ARTHROSCOPY Right 1980s/1990s   bone spur removed    SHOULDER OPEN ROTATOR CUFF REPAIR Right 1970s   SHOULDER SURGERY Left 03/06/2018   antibiotic spacer expalnt with glenoid bone grafting, antiobiotic spacer placement   TOTAL KNEE ARTHROPLASTY Bilateral    TOTAL SHOULDER ARTHROPLASTY Left ~ 2014-2017 X 2   in [place]   TOTAL SHOULDER REPLACEMENT Right ~ 2009   in [place]   TOTAL SHOULDER REVISION Left 12/26/2018   Procedure: Left prosthetic shoulder 1 component revision;  Surgeon: Sharl Selinda Dover, MD;  Location: [facility] OR;  Service: Orthopedics;  Laterality: Left;  90 mins   WISDOM TOOTH EXTRACTION      Family History  Problem Relation Age of Onset   Arthritis Mother    Arthritis Sister    Alzheimer's disease Brother    Arthritis Brother     Social History   Socioeconomic History   Marital status: Single    Spouse name: Not on file   Number of children: Not on file   Years of education: Not on file   Highest education level: Master's degree (e.g., MA, MS, MEng, MEd, MSW, MBA)  Occupational History   Not on file  Tobacco Use   Smoking status: Never   Smokeless tobacco: Never  Vaping Use   Vaping status: Never Used  Substance and Sexual Activity   Alcohol  use: Not Currently   Drug use: Never   Sexual activity: Not Currently  Other Topics Concern   Not on file  Social History Narrative   Not on file   Social Drivers of Health   Financial Resource Strain: Low Risk  (01/23/2024)   Overall Financial Resource Strain (CARDIA)    Difficulty of Paying Living Expenses: Not hard at all  Food Insecurity: No Food Insecurity (01/23/2024)   Hunger Vital Sign    Worried About Running Out of Food in the Last Year: Never true    Ran Out of Food in the Last Year: Never true  Transportation Needs: No Transportation Needs (01/23/2024)   PRAPARE - Administrator, Civil Service (Medical): No    Lack of Transportation (Non-Medical): No  Physical Activity: Sufficiently Active (01/23/2024)   Exercise Vital Sign    Days of  Exercise per Week: 4 days    Minutes of Exercise per Session: 90 min  Stress: No Stress Concern Present (01/23/2024)   [employer] of Occupational Health - Occupational Stress Questionnaire    Feeling of Stress: Not at all  Social Connections: Moderately Integrated (01/23/2024)   Social Connection and Isolation Panel    Frequency of Communication with Friends and Family: More than three times a week    Frequency of Social Gatherings with Friends and Family: More than three times a week    Attends Religious Services: More than 4 times per year    Active Member of [employer] or Organizations: Yes    Attends Banker Meetings: 1 to 4 times per year    Marital Status: Never married  Recent Concern: Social Connections - Moderately Isolated (11/04/2023)   Social Connection and  Isolation Panel    Frequency of Communication with Friends and Family: More than three times a week    Frequency of Social Gatherings with Friends and Family: More than three times a week    Attends Religious Services: More than 4 times per year    Active Member of [employer] or Organizations: No    Attends Banker Meetings: Never    Marital Status: Never married  Intimate Partner Violence: Not At Risk (10/09/2023)   Humiliation, Afraid, Rape, and Kick questionnaire    Fear of Current or Ex-Partner: No    Emotionally Abused: No    Physically Abused: No    Sexually Abused: No     Physical Exam   Vitals:   04/26/24 0352  BP: (!) 169/105  Pulse: 79  Resp: 16  Temp: 97.6 F (36.4 C)  SpO2: 100%    CONSTITUTIONAL: Well-appearing, NAD NEURO/PSYCH:  Alert and oriented x 3, no focal deficits EYES:  eyes equal and reactive ENT/NECK:  no LAD, no JVD CARDIO: Regular rate, well-perfused, normal S1 and S2 PULM:  CTAB no wheezing or rhonchi GI/GU:  non-distended, non-tender MSK/SPINE:  No gross deformities, no edema SKIN:  no rash, atraumatic   *Additional and/or pertinent findings included in  MDM below  Diagnostic and Interventional Summary    EKG Interpretation Date/Time:    Ventricular Rate:    PR Interval:    QRS Duration:    QT Interval:    QTC Calculation:   R Axis:      Text Interpretation:         Labs Reviewed - No data to display  No orders to display    Medications - No data to display   Procedures  /  Critical Care Procedures  ED Course and Medical Decision Making  Initial Impression and Ddx Asymptomatic here with questions about his medications.  New medication of low-dose compounded naltrexone with the indicated use being an anti-inflammatory.  Also taking tramadol  which would be much less effective while on naltrexone.  Denies any significant increased pain, no other concerning symptoms.  Past medical/surgical history that increases complexity of ED encounter: Chronic pain  Interpretation of Diagnostics Laboratory and/or imaging options to aid in the diagnosis/care of the patient were considered.  After careful history and physical examination, it was determined that there was no indication for diagnostics at this time.  Patient Reassessment and Ultimate Disposition/Management     Patient here with questions and concerns, not symptoms, normal vitals, no emergent process appropriate for discharge.  Patient management required discussion with the following services or consulting groups:  None  Complexity of Problems Addressed Acute complicated illness or Injury  Additional Data Reviewed and Analyzed Further history obtained from: None  Additional Factors Impacting ED Encounter Risk None  Ozell HERO. Theadore, MD [facility] Emergency Medicine [facility] [email or web address]  Final Clinical Impressions(s) / ED Diagnoses     ICD-10-CM   1. Medication discontinued due to interaction  T50.905A       ED Discharge Orders     None        Discharge Instructions Discussed with and Provided to Patient:    Discharge  Instructions      You were evaluated in the Emergency Department and after careful evaluation, we did not find any emergent condition requiring admission or further testing in the hospital.  Your exam/testing today is overall reassuring.  Recommend stopping the naltrexone and discussing your medications with your primary care  doctor.  Please return to the Emergency Department if you experience any worsening of your condition.   Thank you for allowing us  to be a part of your care.      Theadore Ozell HERO, MD 04/26/24 [phone]

## 2024-04-28 ENCOUNTER — Encounter: Payer: Self-pay | Admitting: Family Medicine

## 2024-04-29 DIAGNOSIS — M5416 Radiculopathy, lumbar region: Secondary | ICD-10-CM | POA: Diagnosis not present

## 2024-04-29 DIAGNOSIS — Z6830 Body mass index (BMI) 30.0-30.9, adult: Secondary | ICD-10-CM | POA: Diagnosis not present

## 2024-04-29 DIAGNOSIS — Z6829 Body mass index (BMI) 29.0-29.9, adult: Secondary | ICD-10-CM | POA: Diagnosis not present

## 2024-05-16 DIAGNOSIS — Z6830 Body mass index (BMI) 30.0-30.9, adult: Secondary | ICD-10-CM | POA: Diagnosis not present

## 2024-05-16 DIAGNOSIS — E291 Testicular hypofunction: Secondary | ICD-10-CM | POA: Diagnosis not present

## 2024-05-20 DIAGNOSIS — K080 Exfoliation of teeth due to systemic causes: Secondary | ICD-10-CM | POA: Diagnosis not present

## 2024-05-21 ENCOUNTER — Other Ambulatory Visit: Payer: Self-pay | Admitting: Family Medicine

## 2024-05-28 ENCOUNTER — Encounter: Payer: Self-pay | Admitting: Physician Assistant

## 2024-05-28 ENCOUNTER — Telehealth: Payer: Self-pay | Admitting: Family Medicine

## 2024-05-28 ENCOUNTER — Ambulatory Visit: Payer: Self-pay

## 2024-05-28 ENCOUNTER — Other Ambulatory Visit: Payer: Self-pay

## 2024-05-28 ENCOUNTER — Ambulatory Visit: Admitting: Physician Assistant

## 2024-05-28 ENCOUNTER — Emergency Department (HOSPITAL_BASED_OUTPATIENT_CLINIC_OR_DEPARTMENT_OTHER)
Admission: EM | Admit: 2024-05-28 | Discharge: 2024-05-28 | Disposition: A | Discharge status: Home / Self Care | Attending: Emergency Medicine | Admitting: Emergency Medicine

## 2024-05-28 ENCOUNTER — Encounter (HOSPITAL_BASED_OUTPATIENT_CLINIC_OR_DEPARTMENT_OTHER): Payer: Self-pay

## 2024-05-28 VITALS — BP 138/86 | HR 78 | Ht 71.0 in | Wt 214.3 lb

## 2024-05-28 DIAGNOSIS — Z79899 Other long term (current) drug therapy: Secondary | ICD-10-CM | POA: Diagnosis not present

## 2024-05-28 DIAGNOSIS — F419 Anxiety disorder, unspecified: Secondary | ICD-10-CM

## 2024-05-28 DIAGNOSIS — I10 Essential (primary) hypertension: Secondary | ICD-10-CM

## 2024-05-28 DIAGNOSIS — F4323 Adjustment disorder with mixed anxiety and depressed mood: Secondary | ICD-10-CM

## 2024-05-28 DIAGNOSIS — M5416 Radiculopathy, lumbar region: Secondary | ICD-10-CM | POA: Diagnosis not present

## 2024-05-28 MED ORDER — PREDNISONE 20 MG PO TABS: 20.0000 mg | ORAL_TABLET | Freq: Every day | ORAL | 0 refills | Status: DC

## 2024-05-28 MED ORDER — GABAPENTIN 300 MG PO CAPS: ORAL_CAPSULE | ORAL | Status: DC

## 2024-05-28 MED ORDER — HYDROXYZINE HCL 25 MG PO TABS
12.5000 mg | ORAL_TABLET | Freq: Once | ORAL | Status: AC
  Administered 2024-05-28: 12.5 mg via ORAL
  Filled 2024-05-28: qty 1

## 2024-05-28 MED ORDER — CLONAZEPAM 0.5 MG PO TABS: 0.5000 mg | ORAL_TABLET | Freq: Three times a day (TID) | ORAL | 0 refills | Status: DC | PRN

## 2024-05-28 NOTE — ED Triage Notes (Signed)
 He reports taking some different medicines for my back pain and he theorizes they may be running up my blood pressure. He is ambulatory and in no distress. He states his doctors have performed tests, including MRI to check my back.

## 2024-05-28 NOTE — Patient Instructions (Addendum)
 If you develop suicidal thoughts, please tell someone and immediately proceed to our local 24/7 crisis center, [facility]. [address], [place], [place] [address] [phone].   VISIT SUMMARY: Today, we addressed your chronic pain, mental health concerns, and hypertension. We made adjustments to your medications and provided guidance on managing your symptoms.  YOUR PLAN: CHRONIC PAIN SYNDROME WITH NEUROPATHIC FEATURES: You have chronic pain with significant leg pain and numbness, and concerns about multiple medications and withdrawal symptoms. -We prescribed oral prednisone  for pain management. -Please contact [facility] to discuss stopping naltrexone and peptides. -Avoid tramadol  until naltrexone is completely out of your system. -Continue taking gabapentin  as prescribed. -You have a follow-up appointment with Doctor Kennyth on Friday.  DEPRESSION AND ANXIETY SYMPTOMS: Your depression and anxiety are worsened by chronic pain and ineffective pain management. You have a history of using antidepressants and currently use Klonopin  for anxiety. -We refilled your Klonopin  prescription and advised you to use it consistently. -Please communicate your mental health struggles with your support system. -Remember that urgent care options are available for acute mental health support.  HYPERTENSION: You have had recent elevated blood pressure readings but no regular home monitoring. -We advised you to monitor your blood pressure at home regularly.                      Contains text generated by [employer]bridge.                                 Contains text generated by Abridge.

## 2024-05-28 NOTE — Telephone Encounter (Signed)
 FYI Only or Action Required?: FYI only for provider: ED advised.  Patient was last seen in primary care on 03/20/2024 by Kennyth Worth HERO, MD.  Called Nurse Triage reporting Anxiety.  Symptoms began yesterday.  Interventions attempted: Nothing.  Symptoms are: gradually worsening.  Triage Disposition: Go to ED Now (Notify PCP)  Patient/caregiver understands and will follow disposition?: Yes   Copied from CRM #[id number]. Topic: Clinical - Red Word Triage >> May 28, 2024  9:20 AM Donna BRAVO wrote: Red Word that prompted transfer to Nurse Triage:    -Anxity off the charts -don't feel right -BP last night 175/117 -patient miss understood my instruction and took BP medication and instead of taking his BP only -BP 05/28/24 at 9:22am    163/110    took BP medication at 9:19am amLODipine  (NORVASC ) 10 MG tablet  propranolol  ER (INDERAL  LA) 80 MG 24 hr capsule   -           Reason for Disposition  [1] Difficulty breathing AND [2] persists > 10 minutes AND [3] not relieved by reassurance provided by triager  Answer Assessment - Initial Assessment Questions 1. CONCERN: Did anything happen that prompted you to call today?      Pain, leg numbness, just don't feel right 2. ANXIETY SYMPTOMS: Can you describe how you (your loved one; patient) have been feeling? (e.g., tense, restless, panicky, anxious, keyed up, overwhelmed, sense of impending doom).      Overwhelmed, hopefulness, 3. ONSET: How long have you been feeling this way? (e.g., hours, days, weeks)     yesterday 4. SEVERITY: How would you rate the level of anxiety? (e.g., 0 - 10; or mild, moderate, severe).     Moderate  5. FUNCTIONAL IMPAIRMENT: How have these feelings affected your ability to do daily activities? Have you had more difficulty than usual doing your normal daily activities? (e.g., getting better, same, worse; self-care, school, work, interactions)     yes 6. HISTORY: Have you felt this way before? Have  you ever been diagnosed with an anxiety problem in the past? (e.g., generalized anxiety disorder, panic attacks, PTSD). If Yes, ask: How was this problem treated? (e.g., medicines, counseling, etc.)     Yes - medications 7. RISK OF HARM - SUICIDAL IDEATION: Do you ever have thoughts of hurting or killing yourself? If Yes, ask:  Do you have these feelings now? Do you have a plan on how you would do this?     no 8. TREATMENT:  What has been done so far to treat this anxiety? (e.g., medicines, relaxation strategies). What has helped?     Medication - klonopin  last night  9. THERAPIST: Do you have a counselor or therapist? If Yes, ask: What is their name?     no 10. POTENTIAL TRIGGERS: Do you drink caffeinated beverages (e.g., coffee, colas, teas), and how much daily? Do you drink alcohol  or use any drugs? Have you started any new medicines recently?       Medical issues dealing with currently 11. PATIENT SUPPORT: Who is with you now? Who do you live with? Do you have family or friends who you can talk to?        yes 12. OTHER SYMPTOMS: Do you have any other symptoms? (e.g., feeling depressed, trouble concentrating, trouble sleeping, trouble breathing, palpitations or fast heartbeat, chest pain, sweating, nausea, or diarrhea)       Trouble concentrating, trouble sleeping, diarrhea 13. PREGNANCY: Is there any chance you are pregnant? When was  your last menstrual period?       Na Pt took night time BP medication today around 9:15 am instead of taking them this evening.  Pt is not thinking clearly due to feeling overwhelmed.  Nurse recommended pt go to ER now: pt verbalized understanding and stated going now and will have someone drive for him.  BP @0950  188/108 HR 78: BP @0955  194/106 HR79.  Protocols used: Anxiety and Panic Attack-A-AH

## 2024-05-28 NOTE — Progress Notes (Signed)
 History of Present Illness:   Chief Complaint  Patient presents with   Depression    Discussed the use of AI scribe software for clinical note transcription with the patient, who gave verbal consent to proceed.  History of Present Illness   Geoffrey West is a 68 year old male who presents with uncontrolled chronic pain and mental health concerns.  He has chronic back pain with numbness radiating down his leg into his foot. Two sets of epidural injections have not controlled the pain, though numbness has improved. He takes gabapentin  300 mg, 2 tablets in the morning, 1 at lunch, and 2 at night, and uses a decompression machine with chiropractic oversight.  His past and current pain-related medications include prednisone , long-term Celebrex  recently stopped, gabapentin , testosterone  shots, peptides (BPC 157), and low-dose naltrexone 4.5 mg daily. Prednisone  helps but he is cautious about chronic use. He recently stopped Celebrex  after running out and wanting fewer medications. He had an ER visit for withdrawal-like symptoms when tramadol  was taken with naltrexone.  He has had history of 28 orthopedic surgeries, including bilateral knee and shoulder replacements, and is prone to withdrawal symptoms. He had severe withdrawal after knee replacement. He has anxiety and nocturnal panic attacks relieved by small as-needed doses of Klonopin , with a limited supply left.  He relies mainly on a brother-in-law and two nieces for support but is reluctant to ask for help. His pain severity fluctuates unpredictably. He previously tried antidepressants without benefit and reports hopelessness at times but denies a plan to harm himself.  He has seen neurosurgery and rheumatology and tried methotrexate, Cosentyx (not covered by insurance), acupuncture, without significant or sustained relief.        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   Rash 09/17/2019   Situational depression    recently lost my mom and brother (03/06/2018)   Wears contact lenses    Wears glasses      Social History   Tobacco Use   Smoking status: Never   Smokeless tobacco: Never  Vaping Use   Vaping status: Never Used  Substance Use Topics   Alcohol  use: Not Currently   Drug use: Never    Past Surgical History:  Procedure Laterality Date   BACK SURGERY     COLONOSCOPY     COLONOSCOPY W/ BIOPSIES AND POLYPECTOMY     EXCISIONAL TOTAL SHOULDER ARTHROPLASTY WITH ANTIBIOTIC SPACER Left 03/06/2018   Procedure: Left shoulder antibiotic spacer expalnt with glenoid bone grafting, antiobiotic spacer placement;  Surgeon: Sharl Selinda Dover, MD;  Location: [facility] OR;  Service: Orthopedics;  Laterality: Left;   EXCISIONAL TOTAL SHOULDER ARTHROPLASTY WITH ANTIBIOTIC SPACER Left 01/06/2020   Procedure: LEFT SHOULDER ARTHROPLASTY EXPLANT WITH ANTIBIOTIC SPACER PLACEMENT;  Surgeon: Sharl Selinda Dover, MD;  Location: [facility] OR;  Service: Orthopedics;  Laterality: Left;  2.5 hrs RNFA   IR FLUORO GUIDE CV LINE RIGHT  12/26/2016   IRRIGATION AND DEBRIDEMENT SHOULDER Left 05/27/2020   Procedure: Left shoulder antibiotic spacer exchange;  Surgeon: Sharl Selinda Dover, MD;  Location: [facility] OR;  Service: Orthopedics;  Laterality: Left;  120 mins   KNEE ARTHROSCOPY Bilateral    X 10 total arthroscopies   KNEE CARTILAGE SURGERY Left 1977   cartilage removed   LASIK Bilateral    LUMBAR DISC SURGERY  2000s   ruptured disc    REVERSE SHOULDER ARTHROPLASTY Left 12/08/2016   Procedure: Revision left total shoulder to reverse total shoulder arthroplasty;  Surgeon: Sharl Selinda Dover, MD;  Location: [facility] OR;  Service: Orthopedics;  Laterality: Left;   REVERSE SHOULDER ARTHROPLASTY Left 01/01/2018   Procedure: Left reverse shouler explant with antibiotic spacer placement;  Surgeon: Sharl Selinda Dover, MD;  Location: [facility] ORS;  Service: Orthopedics;  Laterality: Left;  2.5 hrs   REVERSE SHOULDER ARTHROPLASTY Left 06/11/2018   Procedure: REVERSE TOTAL SHOULDER REVISION;  Surgeon: Sharl Selinda Dover, MD;  Location: [facility] OR;  Service: Orthopedics;  Laterality: Left;   REVERSE SHOULDER ARTHROPLASTY Left 12/05/2018   Procedure: Left reverse shoulder explant, bone grafting;  Surgeon: Sharl Selinda Dover, MD;  Location: [facility] OR;  Service: Orthopedics;  Laterality: Left;  2.5 hrs   REVISION OF TOTAL SHOULDER Left 12/26/2018   REVISION TOTAL SHOULDER TO REVERSE TOTAL SHOULDER Left 07/16/2018   Procedure: Left shoulder irrigation and debridement with poly and head ball exchange;  Surgeon: Sharl Selinda Dover, MD;  Location: [facility] OR;  Service: Orthopedics;  Laterality: Left;  2 hrs   SHOULDER ARTHROSCOPY Right 1980s/1990s   bone spur removed   SHOULDER OPEN ROTATOR CUFF REPAIR Right 1970s   SHOULDER SURGERY Left 03/06/2018   antibiotic spacer expalnt with glenoid bone grafting, antiobiotic spacer placement   TOTAL KNEE ARTHROPLASTY Bilateral    TOTAL SHOULDER ARTHROPLASTY Left ~ 2014-2017 X 2   in [place]   TOTAL SHOULDER REPLACEMENT Right ~ 2009   in [place]   TOTAL SHOULDER REVISION Left 12/26/2018   Procedure: Left prosthetic shoulder 1 component revision;  Surgeon: Sharl Selinda Dover, MD;  Location: [facility] OR;  Service: Orthopedics;  Laterality: Left;  90 mins   WISDOM TOOTH EXTRACTION      Family History  Problem Relation Age of Onset   Arthritis Mother    Arthritis Sister    Alzheimer's disease Brother     Arthritis Brother     Allergies  Allergen Reactions   Fentanyl  Shortness Of Breath and Other (See Comments)    Reaction to [facility] ONLY > ? DOSE REGULATION ?    Oxycodone Other (See Comments)    Pt states he goes through hot and cold flashes withdrawal like symptoms    Current Medications:   Current Outpatient Medications:    amLODipine  (NORVASC ) 10 MG tablet, Take 1 tablet (10 mg total) by mouth at bedtime., Disp: 90 tablet, Rfl: 3   ARMOUR THYROID  60 MG tablet, 1 tablet on an empty stomach Orally Once a day for 90 days, Disp: , Rfl:    celecoxib  (CELEBREX ) 200 MG capsule, TAKE 1 CAPSULE BY MOUTH 2 TIMES A DAY, Disp: 60 capsule, Rfl: 0   clonazePAM  (KLONOPIN ) 0.5 MG tablet, Take 1 tablet (0.5 mg total) by mouth 3 (three) times daily as needed for anxiety., Disp: 9 tablet, Rfl: 0   diphenhydramine -acetaminophen  (TYLENOL  PM) 25-500 MG TABS tablet, Take 1 tablet by mouth at bedtime as needed (sleep)., Disp: , Rfl:    fluticasone  (FLONASE ) 50 MCG/ACT nasal spray, PLACE  2 SPRAYS INTO BOTH NOSTRILS DAILY AS NEEDED FOR ALLERGIES/ RHINITIS, Disp: 16 mL, Rfl: 0   pantoprazole  (PROTONIX ) 40 MG tablet, TAKE 1 TABLET BY MOUTH EVERY MORNING, Disp: 90 tablet, Rfl: 3   predniSONE  (DELTASONE ) 20 MG tablet, Take 1 tablet (20 mg total) by mouth daily with breakfast., Disp: 3 tablet, Rfl: 0   propranolol  ER (INDERAL  LA) 80 MG 24 hr capsule, TAKE ONE CAPSULE BY MOUTH EVERY EVENING, Disp: 90 capsule, Rfl: 1   testosterone  cypionate (DEPOTESTOSTERONE CYPIONATE) 200 MG/ML injection, Inject 140 mg into the muscle every Wednesday. , Disp: , Rfl: 5   traMADol  (ULTRAM ) 50 MG tablet, Take 1 tablet (50 mg total) by mouth 3 (three) times daily as needed., Disp: 30 tablet, Rfl: 5   folic acid (FOLVITE) 1 MG tablet, Take 1 mg by mouth daily. (Patient not taking: Reported on 05/28/2024), Disp: , Rfl:    gabapentin  (NEURONTIN ) 300 MG capsule, Take 1 capsule (300 mg total) by mouth at bedtime. (Patient not taking: Reported on  05/28/2024), Disp: 30 capsule, Rfl: 3   Melatonin 10 MG TABS, Take by mouth. (Patient not taking: Reported on 05/28/2024), Disp: , Rfl:    Multiple Vitamin (MULTIVITAMIN WITH MINERALS) TABS tablet, Take 1 tablet by mouth daily. (Patient not taking: Reported on 04/02/2024), Disp: , Rfl:    NEEDLE, DISP, 18 G (B-D HYPODERMIC NEEDLE 18GX1.5) 18G X 1-1/2 MISC, Hypodermic Needles 18 gauge x 1 1/2  USE AS DIRECTED WITH TESTOSTERONE  (Patient not taking: Reported on 05/28/2024), Disp: , Rfl:    Review of Systems:   Negative unless otherwise specified per HPI.  Vitals:   Vitals:   05/28/24 1243  BP: 138/86  Pulse: 78  SpO2: 97%  Weight: 214 lb 4.6 oz (97.2 kg)  Height: 5' 11 (1.803 m)     Body mass index is 29.89 kg/m.  Physical Exam:   Physical Exam Vitals and nursing note reviewed.  Constitutional:      Appearance: He is well-developed.  HENT:     Head: Normocephalic.  Eyes:     Conjunctiva/sclera: Conjunctivae normal.     Pupils: Pupils are equal, round, and reactive to light.  Pulmonary:     Effort: Pulmonary effort is normal.  Musculoskeletal:        General: Normal range of motion.     Cervical back: Normal range of motion.  Skin:    General: Skin is warm and dry.  Neurological:     Mental Status: He is alert and oriented to person, place, and time.  Psychiatric:        Attention and Perception: Attention normal.        Mood and Affect: Mood normal. Affect is tearful.        Behavior: Behavior normal.        Thought Content: Thought content normal.        Judgment: Judgment normal.     Assessment and Plan:   Assessment and Plan    Lumbar radiculopathy  Chronic pain, seeing multiple providers Concerns about polypharmacy and withdrawal. Prednisone  effective previously. Considering stopping naltrexone and peptides. - Prescribed oral prednisone  for pain management to get him to Friday appointment (3 days from now) with Primary Care Provider (PCP) -- we considered  steroid shot but was concerned about sudden onset worsening of mood/psychosis/mania with this - Advised to contact [facility] to discuss discontinuation of naltrexone and peptides. - Instructed to avoid tramadol  until naltrexone has been discontinued AND he has had reviewed with  patient reported medical symptoms of concern for today include  - Continue gabapentin  as prescribed. - Scheduled follow-up with Dr Kennyth on Friday.  Persistent adjustment disorder with mixed anxiety and depressed mood Symptoms exacerbated by chronic pain and ineffective pain management. History of antidepressant use -- does not like the way it makes him feel. Current Klonopin  use for anxiety. Expresses hopelessness and occasional self-harm thoughts, denies active plan. Support system includes brother-in-law and nieces. - Refilled Klonopin  with permission for consistent use over the next 2 days until he can see PCP. Declines additional medication options. - Advised to communicate mental health struggles with support system. - Informed about urgent care options for acute mental health support. - I discussed with patient that if they develop any SI, to tell someone immediately and seek medical attention. Behavioral Health Urgent Care information provided.  Hypertension Recent elevated blood pressure readings, no regular home monitoring. Office readings not alarming. - Advised to monitor blood pressure at home.      I personally spent a total of 43 minutes in the care of the patient today including preparing to see the patient, getting/reviewing separately obtained history, performing a medically appropriate exam/evaluation, counseling and educating, referring and communicating with other health care professionals, independently interpreting results, and communicating results.    Lucie Buttner, PA-C

## 2024-05-28 NOTE — Telephone Encounter (Signed)
 FYI- pt advised to go to ED; pt agreeable

## 2024-05-28 NOTE — Telephone Encounter (Unsigned)
 Copied from CRM #[id number]. Topic: Clinical - Red Word Triage >> May 28, 2024  9:20 AM Donna BRAVO wrote: Red Word that prompted transfer to Nurse Triage:   -Anxity off the charts -don't feel right -BP last night 175/117 -patient miss understood my instruction and took BP medication and instead of taking his BP only -BP 05/28/24 at 9:22am    163/110    took BP medication at 9:19am amLODipine  (NORVASC ) 10 MG tablet  propranolol  ER (INDERAL  LA) 80 MG 24 hr capsule  -

## 2024-05-28 NOTE — ED Provider Notes (Signed)
 [place] EMERGENCY DEPARTMENT AT [facility] Provider Note   CSN: [id number] Arrival date & time: 05/28/24  1014     Patient presents with: No chief complaint on file.   Geoffrey West is a 68 y.o. male.   68 year old male presenting with hypertension and anxiety.  Patient notes that he frequently suffers from panic attacks at home, he attributes this to his issues with his chronic back pain that causes him to spiral.  He woke up this morning feeling like I could not catch my breath, he then checked his blood pressure and it was found to be elevated with systolic readings in the 170s and diastolic readings in the 110s.  He is contacted his PCP office and took a second dose of his blood pressure medications (amlodipine /propranolol ), as he misunderstood their instructions when they asked him to check his BP while over the phone, he thought that they meant for him to take a second dose of these medications.  He typically takes these medications at night but took the second dose of them this morning at 9 AM.  He has clonazepam  that he uses as needed at home for management of his anxiety, he is not on any antidepressants.  He denies chest pain, shortness of breath, abdominal pain, worsening lower extremity edema.  He is tearful in the room and notes feeling overwhelmed by his anxiety symptoms.        Prior to Admission medications   Medication Sig Start Date End Date Taking? Authorizing Provider  amLODipine  (NORVASC ) 10 MG tablet Take 1 tablet (10 mg total) by mouth at bedtime. 11/06/23   Kennyth Worth HERO, MD  ARMOUR THYROID  60 MG tablet 1 tablet on an empty stomach Orally Once a day for 90 days 07/09/19   [provider]  celecoxib  (CELEBREX ) 200 MG capsule TAKE 1 CAPSULE BY MOUTH 2 TIMES A DAY 05/21/24   Kennyth Worth HERO, MD  clonazePAM  (KLONOPIN ) 1 MG tablet Take 1 tablet (1 mg total) by mouth daily as needed for anxiety. 03/18/24   Kennyth Worth HERO, MD   diphenhydramine -acetaminophen  (TYLENOL  PM) 25-500 MG TABS tablet Take 1 tablet by mouth at bedtime as needed (sleep).    [provider]  fluticasone  (FLONASE ) 50 MCG/ACT nasal spray PLACE 2 SPRAYS INTO BOTH NOSTRILS DAILY AS NEEDED FOR ALLERGIES/ RHINITIS 03/14/24   Kennyth Worth HERO, MD  folic acid (FOLVITE) 1 MG tablet Take 1 mg by mouth daily. 02/08/24   [provider]  gabapentin  (NEURONTIN ) 300 MG capsule Take 1 capsule (300 mg total) by mouth at bedtime. 03/20/24   Kennyth Worth HERO, MD  Melatonin 10 MG TABS Take by mouth. Patient not taking: Reported on 04/02/2024    [provider]  Multiple Vitamin (MULTIVITAMIN WITH MINERALS) TABS tablet Take 1 tablet by mouth daily. Patient not taking: Reported on 04/02/2024    [provider]  NEEDLE, DISP, 18 G (B-D HYPODERMIC NEEDLE 18GX1.5) 18G X 1-1/2 MISC Hypodermic Needles 18 gauge x 1 1/2  USE AS DIRECTED WITH TESTOSTERONE     [provider]  pantoprazole  (PROTONIX ) 40 MG tablet TAKE 1 TABLET BY MOUTH EVERY MORNING 12/06/23   Kennyth Worth HERO, MD  predniSONE  (DELTASONE ) 20 MG tablet Take 1 tablet (20 mg total) by mouth daily with breakfast. 03/20/24   Kennyth Worth HERO, MD  propranolol  ER (INDERAL  LA) 80 MG 24 hr capsule TAKE ONE CAPSULE BY MOUTH EVERY EVENING 01/10/24   Kennyth Worth HERO, MD  testosterone  cypionate (DEPOTESTOSTERONE  CYPIONATE) 200 MG/ML injection Inject 140 mg into the muscle every Wednesday.     [provider]  traMADol  (ULTRAM ) 50 MG tablet Take 1 tablet (50 mg total) by mouth 3 (three) times daily as needed. 02/20/24   Kennyth Worth HERO, MD    Allergies: Fentanyl  and Oxycodone    Review of Systems  Updated Vital Signs  Vitals:   05/28/24 1019  BP: (!) 160/99  Pulse: 77  Resp: 15  Temp: 97.7 F (36.5 C)  TempSrc: Oral  SpO2: 98%     Physical Exam Vitals and nursing note reviewed.  HENT:     Head: Normocephalic.  Eyes:     Extraocular Movements: Extraocular  movements intact.  Cardiovascular:     Rate and Rhythm: Normal rate and regular rhythm.     Heart sounds: Normal heart sounds.  Pulmonary:     Effort: Pulmonary effort is normal.     Breath sounds: Normal breath sounds.  Abdominal:     Palpations: Abdomen is soft.     Tenderness: There is no abdominal tenderness. There is no guarding.  Musculoskeletal:     Cervical back: Normal range of motion.     Right lower leg: No edema.     Left lower leg: No edema.     Comments: Moves all extremities spontaneously without difficulty  Skin:    General: Skin is warm and dry.  Neurological:     Mental Status: He is alert and oriented to person, place, and time.     (all labs ordered are listed, but only abnormal results are displayed) Labs Reviewed - No data to display  EKG: None  Radiology: No results found.   Procedures   Medications Ordered in the ED  hydrOXYzine  (ATARAX ) tablet 12.5 mg (has no administration in time range)                                    Medical Decision Making This patient presents to the ED for concern of hypertension, this involves an extensive number of treatment options, and is a complaint that carries with it a high risk of complications and morbidity.  The differential diagnosis includes poorly controlled hypertension, hypertensive emergency, anxiety/panic attack   Co morbidities that complicate the patient evaluation  Hypertension, chronic back pain   Additional history obtained:  Additional history obtained from record review External records from outside source obtained and reviewed including nurse triage note from earlier today   Cardiac Monitoring: / EKG:  The patient was maintained on a cardiac monitor.  I personally viewed and interpreted the cardiac monitored which showed an underlying rhythm of: NSR   Problem List / ED Course / Critical interventions / Medication management  I ordered medication including hydroxyzine  for  anxiety I have reviewed the patients home medicines and have made adjustments as needed   Test / Admission - Considered:  At time of my initial examination, patient was mildly hypertensive with a BP of 152/85 in the room, this is improved as compared to his presenting BP as well as BP that he had at home prior to presenting to the emergency department today.  At this time, patient has no concerning signs/symptoms that would raise suspicion for hypertensive emergency, he is well-appearing and denies chest pain/shortness of breath/abdominal pain/nausea/vomiting/lower extremity edema.  I had an extensive discussion with the patient in regard to his anxiety symptoms and panic attacks, he  mentions that he takes clonazepam  as needed for anxiety and this seems to help calm him, he requests medication to calm him in the emergency department today, I advised him that benzodiazepines are no longer the drug of choice for management of anxiety/depression, I recommend that he discuss his symptoms with his PCP as he may benefit from initiation of an SSRI or other antidepressant medication, as it seems that this is largely contributing to his presentation today.  He voiced understanding and is in agreement with this plan.  Given improved blood pressure readings, I do not feel that labs/imaging are warranted at this time, I recommend the patient continue his blood pressure medications as previously directed, I do not feel that administration of an additional blood pressure medication would be warranted in his treatment today.  Return precautions discussed, patient voiced understanding and is appropriate for discharge at this time.   Staffed with Dr. Rogelia who independently evaluated this patient at the bedside   Risk Prescription drug management.        Final diagnoses:  Hypertension, unspecified type  Anxiety    ED Discharge Orders     None          Glendia Rocky LOISE DEVONNA 05/28/24 1802     Rogelia Jerilynn RAMAN, MD 06/03/24 1309

## 2024-05-28 NOTE — Telephone Encounter (Signed)
 Patient came in office stating he forgot to ask Lucie Buttner if she thinks that cause of HTN could be related to withdrawal from pain medication. Patient is requesting a call back for response from either [facility] or clinical staff. Patient states he is feeling something similar to withdrawal symptoms in the past. States he touched on this during appt w/ Buttner today. Please advise.

## 2024-05-28 NOTE — Discharge Instructions (Signed)
 Your blood pressure improved after you presented to the emergency department today, please keep a log of your blood pressure to present at your follow-up appointment with your PCP, as they may need to adjust your medications based on this.  Please discuss your symptoms of anxiety with your primary care provider, as you may benefit from initiation of an antidepressant for management of this.  I have also provided you with the contact information for a pain management specialist, please contact their office to schedule follow-up in regard to your chronic back pain.  Return to the emergency department if your symptoms worsen.

## 2024-05-29 ENCOUNTER — Telehealth: Payer: Self-pay

## 2024-05-29 DIAGNOSIS — G4700 Insomnia, unspecified: Secondary | ICD-10-CM | POA: Insufficient documentation

## 2024-05-29 DIAGNOSIS — E039 Hypothyroidism, unspecified: Secondary | ICD-10-CM | POA: Insufficient documentation

## 2024-05-29 DIAGNOSIS — Z6830 Body mass index (BMI) 30.0-30.9, adult: Secondary | ICD-10-CM | POA: Insufficient documentation

## 2024-05-29 DIAGNOSIS — J309 Allergic rhinitis, unspecified: Secondary | ICD-10-CM | POA: Insufficient documentation

## 2024-05-29 DIAGNOSIS — R5383 Other fatigue: Secondary | ICD-10-CM | POA: Insufficient documentation

## 2024-05-29 DIAGNOSIS — M6281 Muscle weakness (generalized): Secondary | ICD-10-CM | POA: Insufficient documentation

## 2024-05-29 DIAGNOSIS — G43709 Chronic migraine without aura, not intractable, without status migrainosus: Secondary | ICD-10-CM | POA: Insufficient documentation

## 2024-05-29 DIAGNOSIS — F329 Major depressive disorder, single episode, unspecified: Secondary | ICD-10-CM | POA: Insufficient documentation

## 2024-05-29 DIAGNOSIS — G43109 Migraine with aura, not intractable, without status migrainosus: Secondary | ICD-10-CM | POA: Insufficient documentation

## 2024-05-29 DIAGNOSIS — E291 Testicular hypofunction: Secondary | ICD-10-CM | POA: Insufficient documentation

## 2024-05-29 DIAGNOSIS — E782 Mixed hyperlipidemia: Secondary | ICD-10-CM | POA: Insufficient documentation

## 2024-05-29 DIAGNOSIS — D751 Secondary polycythemia: Secondary | ICD-10-CM | POA: Insufficient documentation

## 2024-05-29 DIAGNOSIS — G479 Sleep disorder, unspecified: Secondary | ICD-10-CM | POA: Insufficient documentation

## 2024-05-29 DIAGNOSIS — Z79890 Hormone replacement therapy (postmenopausal): Secondary | ICD-10-CM | POA: Insufficient documentation

## 2024-05-29 DIAGNOSIS — F410 Panic disorder [episodic paroxysmal anxiety] without agoraphobia: Secondary | ICD-10-CM | POA: Insufficient documentation

## 2024-05-29 NOTE — Telephone Encounter (Signed)
 Tried to call pt back no answer left voicemail to call office back and left he has appt tomorrow with pcp

## 2024-05-29 NOTE — Telephone Encounter (Signed)
 Transition Care Management Follow-up Telephone Call Date of discharge and from where: 05/28/24 [facility] How have you been since you were released from the hospital? [facility] Any questions or concerns? No  Items Reviewed: Did the pt receive and understand the discharge instructions provided? Yes  Medications obtained and verified? Yes  Other? No  Any new allergies since your discharge? No  Dietary orders reviewed? No Do you have support at home? Yes   Home Care and Equipment/Supplies: Were home health services ordered? not applicable If so, what is the name of the agency?   Has the agency set up a time to come to the patient's home? not applicable Were any new equipment or medical supplies ordered?  No What is the name of the medical supply agency?  Were you able to get the supplies/equipment? not applicable Do you have any questions related to the use of the equipment or supplies? No  Functional Questionnaire: (I = Independent and D = Dependent) ADLs: I  Bathing/Dressing- I  Meal Prep- I  Eating- I  Maintaining continence- I  Transferring/Ambulation- I  Managing Meds- I  Follow up appointments reviewed:  PCP Hospital f/u appt confirmed? Yes  Scheduled to see Worth Kitty, MD on 05/30/24  Specialist Hospital f/u appt confirmed? Not needed  Are transportation arrangements needed? No  If their condition worsens, is the pt aware to call PCP or go to the Emergency Dept.? Yes Was the patient provided with contact information for the PCP's office or ED? Yes Was to pt encouraged to call back with questions or concerns? Yes

## 2024-05-30 ENCOUNTER — Ambulatory Visit: Admitting: Family Medicine

## 2024-05-30 ENCOUNTER — Encounter: Payer: Self-pay | Admitting: Family Medicine

## 2024-05-30 VITALS — BP 130/80 | HR 83 | Temp 98.1°F | Ht 71.0 in | Wt 218.4 lb

## 2024-05-30 DIAGNOSIS — F419 Anxiety disorder, unspecified: Secondary | ICD-10-CM | POA: Diagnosis not present

## 2024-05-30 DIAGNOSIS — M5417 Radiculopathy, lumbosacral region: Secondary | ICD-10-CM | POA: Diagnosis not present

## 2024-05-30 DIAGNOSIS — I10 Essential (primary) hypertension: Secondary | ICD-10-CM | POA: Diagnosis not present

## 2024-05-30 MED ORDER — FLUTICASONE PROPIONATE 50 MCG/ACT NA SUSP: 2.0000 | Freq: Every day | NASAL | 0 refills | Status: DC

## 2024-05-30 MED ORDER — TRAMADOL HCL 50 MG PO TABS: 50.0000 mg | ORAL_TABLET | Freq: Three times a day (TID) | ORAL | 0 refills | Status: AC | PRN

## 2024-05-30 MED ORDER — GABAPENTIN 300 MG PO CAPS: ORAL_CAPSULE | ORAL | 3 refills | Status: DC

## 2024-05-30 NOTE — Assessment & Plan Note (Signed)
 Symptoms are improving though still not controlled.  Follows with neurosurgery.  I last saw him for this several months ago and we started him on gabapentin .  Since our last visit his gabapentin  has been increased and he is now taking 600 mg in the morning, 300 mg afternoon, and 600 mg at night.  Does feel like this has been effective.  Did discuss increasing the dose however he would like to check with his neurosurgeon first.  We will send a message to them though patient is out of gabapentin  and will refill today at his current dose.  He previously did well with tramadol  and we will refill this today.  Tolerated well and did help with management for his pain.  We did discuss adding on Cymbalta as this would probably help some with his chronic pain as well as his anxiety symptoms however he would like to hold off on this for now.  He will follow-up with his neurosurgeon next month though we can follow-up here next week at his follow-up visit with us .

## 2024-05-30 NOTE — Assessment & Plan Note (Addendum)
 Had a lengthy discussion with patient today regarding his worsening mood and anxiety symptoms.  This has significantly worsened within the last several weeks.  His worsening mood is likely multifactorial however he is concerned about potential side effect of naltrexone that was started a few weeks ago.  He stopped this a couple of days ago.  He does have ongoing chronic pain which is also contributing to his worsening mood overall he does feel like the pain levels are improving and we are managing that as below.  We discussed treatment options for his anxiety.  He is currently using clonazepam  as needed which does seem to be effective.  We did discuss adding on another medication such as Cymbalta or SSRI.  He has been on SSRI in the past however does not wish to restart anything like this today.  Also did discuss having him follow-up with a therapist however he declined for today.  Hopefully his symptoms will continue to improve as the naltrexone washes out over the next several days.  He will follow-up with us  next week.

## 2024-05-30 NOTE — Progress Notes (Signed)
 Geoffrey West is a 68 y.o. male who presents today for an office visit.  Assessment/Plan:  Chronic Problems Addressed Today: Anxiety Had a lengthy discussion with patient today regarding his worsening mood and anxiety symptoms.  This has significantly worsened within the last several weeks.  His worsening mood is likely multifactorial however he is concerned about potential side effect of naltrexone that was started a few weeks ago.  He stopped this a couple of days ago.  He does have ongoing chronic pain which is also contributing to his worsening mood overall he does feel like the pain levels are improving and we are managing that as below.  We discussed treatment options for his anxiety.  He is currently using clonazepam  as needed which does seem to be effective.  We did discuss adding on another medication such as Cymbalta or SSRI.  He has been on SSRI in the past however does not wish to restart anything like this today.  Also did discuss having him follow-up with a therapist however he declined for today.  Hopefully his symptoms will continue to improve as the naltrexone washes out over the next several days.  He will follow-up with us  next week.  Radiculopathy, lumbosacral region Symptoms are improving though still not controlled.  Follows with neurosurgery.  I last saw him for this several months ago and we started him on gabapentin .  Since our last visit his gabapentin  has been increased and he is now taking 600 mg in the morning, 300 mg afternoon, and 600 mg at night.  Does feel like this has been effective.  Did discuss increasing the dose however he would like to check with his neurosurgeon first.  We will send a message to them though patient is out of gabapentin  and will refill today at his current dose.  He previously did well with tramadol  and we will refill this today.  Tolerated well and did help with management for his pain.  We did discuss adding on Cymbalta as this would  probably help some with his chronic pain as well as his anxiety symptoms however he would like to hold off on this for now.  He will follow-up with his neurosurgeon next month though we can follow-up here next week at his follow-up visit with us .     Subjective:  HPI:  See assessment / plan for status of chronic conditions.  Patient is here today for follow-up.  He was seen here 2 days ago by other provider for depression and lumbar radiculopathy.  He was started on prednisone  for his lumbar radiculopathy and was advised to stop taking naltrexone and peptides he has continued on his gabapentin .  He was continued on clonazepam  for his anxiety and depression  Discussed the use of AI scribe software for clinical note transcription with the patient, who gave verbal consent to proceed.  History of Present Illness Geoffrey West is a 68 year old male with depression and lumbar radiculopathy who presents for follow-up.  He is experiencing ongoing issues with sciatica, causing numbness in his leg. His back pain is severe, and he finds it difficult to pinpoint the exact cause due to the extent of his back issues. He reports some improvement in numbness in his leg, though he is unsure which treatment is responsible. He continues to take gabapentin , which was increased to two pills in the morning, one at lunch, and two at night. He is unsure of the full impact of gabapentin  due to  the combination of treatments.  He has been seeing a chiropractor for decompression therapy, which he believes may be helping. He has previously undergone two rounds of epidural injections, which were ineffective due to the inability to accurately target the pain source. He expresses reluctance towards surgical intervention, particularly spinal fusion, due to the extensive nature of the procedure.  He is experiencing withdrawal symptoms, which he attributes to improper use of naltrexone. He was taking a compounded 4.5 mg  dose sublingually. The withdrawal is similar to past experiences with OxyContin withdrawal, causing feelings of hopelessness and depression. He has not taken naltrexone for a couple of days and is concerned about the withdrawal effects.  His mood has been affected by the uncertainty and complexity of his treatment regimen. He feels overwhelmed by the number of medications and treatments and desires a simpler approach. He has a history of using testosterone  shots for inflammation, which he feels are no longer effective. His testosterone  levels are monitored every three months, with the next check due in January.  He has a history of high blood pressure, which occasionally spikes, causing him to take clonazepam  to manage anxiety and blood pressure. His blood pressure was high recently, prompting him to take clonazepam , which helped him sleep and reduced his anxiety.  He has been on Celebrex  for approximately thirty years for inflammation and reports that his numbers remain stable. He wants to manage his pain without resorting to narcotic medications, citing past negative experiences with pain medications like OxyContin.     05/30/2024    2:40 PM 03/20/2024    9:45 AM 02/14/2024    1:59 PM 11/06/2023    9:47 AM 10/09/2023    8:47 AM  Depression screen PHQ 2/9  Decreased Interest 1 0 0 0 0  Down, Depressed, Hopeless 2 1 0 0 0  PHQ - 2 Score 3 1 0 0 0  Altered sleeping 1 0     Tired, decreased energy 2 1     Change in appetite  0     Feeling bad or failure about yourself  0 0     Trouble concentrating 0 0     Moving slowly or fidgety/restless 0 0     Suicidal thoughts 1 1     PHQ-9 Score 7 3      Difficult doing work/chores Very difficult Not difficult at all        Data saved with a previous flowsheet row definition      05/30/2024    2:40 PM 03/20/2024    9:45 AM 01/31/2023    3:58 PM  GAD 7 : Generalized Anxiety Score  Nervous, Anxious, on Edge 2 1 0  Control/stop worrying 2 1 0  Worry  too much - different things 2 0 0  Trouble relaxing 2 0 0  Restless 2 0 0  Easily annoyed or irritable 1 0 0  Afraid - awful might happen 2 0 0  Total GAD 7 Score 13 2 0  Anxiety Difficulty Somewhat difficult Not difficult at all Not difficult at all           Objective:  Physical Exam: BP 130/80   Pulse 83   Temp 98.1 F (36.7 C) (Temporal)   Ht 5' 11 (1.803 m)   Wt 218 lb 6.4 oz (99.1 kg)   SpO2 98%   BMI 30.46 kg/m   Gen: No acute distress, resting comfortably CV: Regular rate and rhythm with no murmurs appreciated Pulm: Normal  work of breathing, clear to auscultation bilaterally with no crackles, wheezes, or rhonchi Neuro: Grossly normal, moves all extremities Psych: Normal affect and thought content  Time Spent: 45 minutes of total time was spent on the date of the encounter performing the following actions: chart review prior to seeing the patient including recent ED visit, obtaining history, performing a medically necessary exam, counseling on the treatment plan, placing orders, and documenting in our EHR.        Worth HERO. Kennyth, MD 05/30/2024 2:41 PM

## 2024-05-30 NOTE — Patient Instructions (Signed)
 It was very nice to see you today!  VISIT SUMMARY: Today, you had a follow-up appointment to discuss your ongoing issues with sciatica, back pain, and depression. We reviewed your current treatments and made some adjustments to help manage your symptoms more effectively.  YOUR PLAN: LUMBOSACRAL RADICULOPATHY WITH NEUROPATHIC PAIN AND NUMBNESS: You have chronic pain and numbness in your left leg due to nerve issues in your lower back. -Continue taking gabapentin  as prescribed: two pills in the morning, one at lunch, and two at night. -Tramadol  was refilled for pain management as needed. -Continue with chiropractic decompression therapy. -We will monitor your symptoms and adjust treatment as necessary.  DEPRESSION AND ANXIETY DISORDER: Your depression and anxiety are worsened by chronic pain and medication changes. -Naltrexone was discontinued due to withdrawal symptoms. -Continue taking clonazepam  as needed for anxiety, but be cautious of its sedative effects. -We will monitor your mood and anxiety symptoms and reassess during your follow-up.  ESSENTIAL HYPERTENSION: Your blood pressure occasionally spikes due to pain, stress, or anxiety. -Continue with your current antihypertensive regimen. -Monitor your blood pressure regularly, especially during episodes of anxiety or pain.  Return in about 1 week (around 06/06/2024) for Follow Up.   Take care, Dr Kennyth  PLEASE NOTE:  If you had any lab tests, please let us  know if you have not heard back within a few days. You may see your results on mychart before we have a chance to review them but we will give you a call once they are reviewed by us .   If we ordered any referrals today, please let us  know if you have not heard from their office within the next week.   If you had any urgent prescriptions sent in today, please check with the pharmacy within an hour of our visit to make sure the prescription was transmitted appropriately.    Please try these tips to maintain a healthy lifestyle:  Eat at least 3 REAL meals and 1-2 snacks per day.  Aim for no more than 5 hours between eating.  If you eat breakfast, please do so within one hour of getting up.   Each meal should contain half fruits/vegetables, one quarter protein, and one quarter carbs (no bigger than a computer mouse)  Cut down on sweet beverages. This includes juice, soda, and sweet tea.   Drink at least 1 glass of water with each meal and aim for at least 8 glasses per day  Exercise at least 150 minutes every week.

## 2024-06-02 ENCOUNTER — Telehealth: Payer: Self-pay | Admitting: *Deleted

## 2024-06-02 NOTE — Telephone Encounter (Signed)
 Copied from CRM [id number]. Topic: Clinical - Prescription Issue >> Jun 02, 2024  1:53 PM Alfonso HERO wrote: Reason for CRM: patient having pain medicine withdrawals and would like to speak to the doctor he feels he needs some help.  Please schedule a visit with PCP  Elora KRAFT

## 2024-06-03 NOTE — Telephone Encounter (Signed)
 Pt is scheduled 06/06/24.

## 2024-06-06 ENCOUNTER — Encounter: Payer: Self-pay | Admitting: Family Medicine

## 2024-06-06 ENCOUNTER — Ambulatory Visit (INDEPENDENT_AMBULATORY_CARE_PROVIDER_SITE_OTHER): Admitting: Family Medicine

## 2024-06-06 VITALS — BP 120/70 | HR 71 | Temp 98.2°F | Ht 71.0 in | Wt 223.0 lb

## 2024-06-06 DIAGNOSIS — R7989 Other specified abnormal findings of blood chemistry: Secondary | ICD-10-CM

## 2024-06-06 DIAGNOSIS — M5417 Radiculopathy, lumbosacral region: Secondary | ICD-10-CM

## 2024-06-06 DIAGNOSIS — F419 Anxiety disorder, unspecified: Secondary | ICD-10-CM

## 2024-06-06 DIAGNOSIS — I10 Essential (primary) hypertension: Secondary | ICD-10-CM | POA: Diagnosis not present

## 2024-06-06 NOTE — Assessment & Plan Note (Signed)
 Blood pressure at goal today on amlodipine  10 mg daily and propranolol  80 mg daily.

## 2024-06-06 NOTE — Assessment & Plan Note (Signed)
 Symptoms have improved dramatically since our last visit.  He feels like he is now back to baseline over the last couple of days.  He believes all of his symptoms were exacerbated by taking naltrexone and tramadol  at the same time a few weeks ago.  He does have clonazepam  to use as needed however has weaned off of this and has not had to use this recently.  Given that his symptoms have improved dramatically, do not think we need to make any medication changes at this point.  We did briefly discuss Cymbalta due to his concurrent radiculopathy however as above given his improvement symptoms we can hold off on this for now.  He will follow-up with us  in a few weeks via MyChart and we can adjust treatment plan at that time if needed.

## 2024-06-06 NOTE — Assessment & Plan Note (Signed)
 Follows with endocrinology and is on testosterone  replacement.  He did ask us  about taking over testosterone  management.  Informed patient that I would be able to take this over however he is also getting his BPC 157 from endocrinology and discussed with patient that I do not have any training or experience with this.  He will continue seeing endocrinology for his testosterone  replacement for help.

## 2024-06-06 NOTE — Progress Notes (Signed)
 Geoffrey West is a 68 y.o. male who presents today for an office visit.  Assessment/Plan:  Chronic Problems Addressed Today: Anxiety Symptoms have improved dramatically since our last visit.  He feels like he is now back to baseline over the last couple of days.  He believes all of his symptoms were exacerbated by taking naltrexone and tramadol  at the same time a few weeks ago.  He does have clonazepam  to use as needed however has weaned off of this and has not had to use this recently.  Given that his symptoms have improved dramatically, do not think we need to make any medication changes at this point.  We did briefly discuss Cymbalta due to his concurrent radiculopathy however as above given his improvement symptoms we can hold off on this for now.  He will follow-up with us  in a few weeks via MyChart and we can adjust treatment plan at that time if needed.  Essential hypertension Blood pressure at goal today on amlodipine  10 mg daily and propranolol  80 mg daily.  Decreased testosterone  level Follows with endocrinology and is on testosterone  replacement.  He did ask us  about taking over testosterone  management.  Informed patient that I would be able to take this over however he is also getting his BPC 157 from endocrinology and discussed with patient that I do not have any training or experience with this.  He will continue seeing endocrinology for his testosterone  replacement for help.  Radiculopathy, lumbosacral region Symptoms are stable and currently manageable.  He is currently on gabapentin  600 mg in the morning, 300 g in the afternoon and 600 mg at night.  As at our previous visits we did discuss potentially increasing the dose however we will continue with current regimen for now.  He will see his neurosurgeon soon.  We also did discuss trial of Cymbalta due to his concurrent anxiety however we will hold off on this for now given that his symptoms are stable.  Will defer further  management to his neurosurgeon though he will let us  know if he needs any further assistance.     Subjective:  HPI:  See assessment / plan for status of chronic conditions.   Discussed the use of AI scribe software for clinical note transcription with the patient, who gave verbal consent to proceed.  History of Present Illness Teyon Odette is a 68 year old male who presents for follow-up regarding medication management and pain control.  He has experienced significant improvement in pain since discontinuing naltrexone, which he believes was causing adverse effects when taken with tramadol . He had severe pain in the buttocks radiating down the leg to the foot, which has improved significantly. He still experiences some sensation in the foot and is unsure if this is due to gabapentin  or other treatments like chiropractic decompression therapy.  He is currently taking gabapentin , which was refilled for only nine days by the neurosurgeon, and clonazepam , which he is tapering by breaking the pills into halves and quarters. He has about eight pills left and feels he is managing well with this reduction.  He discusses the use of BPC-157, a compound he is considering for musculoskeletal recovery, and is consulting with pharmacist friends before proceeding. He has not experienced side effects from it in the past. He mentions seeing a provider for testosterone  shots and previously received naltrexone and BPC-157 from the same provider.  He reports difficulty with weight gain due to inactivity and withdrawal symptoms, which  affected his sleep patterns. He is now returning to a more normal routine, waking up in the mornings and engaging in weight training and cardio as tolerated.     06/06/2024    9:50 AM 05/30/2024    2:40 PM 03/20/2024    9:45 AM 02/14/2024    1:59 PM 11/06/2023    9:47 AM  Depression screen PHQ 2/9  Decreased Interest 0 1 0 0 0  Down, Depressed, Hopeless 1 2 1  0 0  PHQ  - 2 Score 1 3 1  0 0  Altered sleeping 1 1 0    Tired, decreased energy 1 2 1     Change in appetite 1  0    Feeling bad or failure about yourself  0 0 0    Trouble concentrating 0 0 0    Moving slowly or fidgety/restless 0 0 0    Suicidal thoughts 0 1 1    PHQ-9 Score 4 7 3      Difficult doing work/chores Not difficult at all Very difficult Not difficult at all       Data saved with a previous flowsheet row definition       06/06/2024    9:50 AM 05/30/2024    2:40 PM 03/20/2024    9:45 AM 01/31/2023    3:58 PM  GAD 7 : Generalized Anxiety Score  Nervous, Anxious, on Edge 1 2 1  0  Control/stop worrying 1 2 1  0  Worry too much - different things 1 2 0 0  Trouble relaxing 1 2 0 0  Restless 1 2 0 0  Easily annoyed or irritable 1 1 0 0  Afraid - awful might happen 0 2 0 0  Total GAD 7 Score 6 13 2  0  Anxiety Difficulty Not difficult at all Somewhat difficult Not difficult at all Not difficult at all           Objective:  Physical Exam: BP 120/70   Pulse 71   Temp 98.2 F (36.8 C) (Temporal)   Ht 5' 11 (1.803 m)   Wt 223 lb (101.2 kg)   SpO2 98%   BMI 31.10 kg/m   Gen: No acute distress, resting comfortably CV: Regular rate and rhythm with no murmurs appreciated Pulm: Normal work of breathing, clear to auscultation bilaterally with no crackles, wheezes, or rhonchi Neuro: Grossly normal, moves all extremities Psych: Normal affect and thought content      Banessa Mao M. Kennyth, MD 06/06/2024 9:56 AM

## 2024-06-06 NOTE — Assessment & Plan Note (Signed)
 Symptoms are stable and currently manageable.  He is currently on gabapentin  600 mg in the morning, 300 g in the afternoon and 600 mg at night.  As at our previous visits we did discuss potentially increasing the dose however we will continue with current regimen for now.  He will see his neurosurgeon soon.  We also did discuss trial of Cymbalta due to his concurrent anxiety however we will hold off on this for now given that his symptoms are stable.  Will defer further management to his neurosurgeon though he will let us  know if he needs any further assistance.

## 2024-06-06 NOTE — Patient Instructions (Addendum)
 It was very nice to see you today!  VISIT SUMMARY: You had a follow-up visit to discuss your medication management and pain control. Your pain has improved since stopping naltrexone, and you are managing well with gabapentin  and clonazepam . You are considering BPC-157 for musculoskeletal recovery and are consulting with pharmacists about it. Your blood pressure is improving but still needs monitoring.  YOUR PLAN: LUMBOSACRAL RADICULOPATHY: Your symptoms have improved with reduced pain, and gabapentin  has been effective. -Continue taking gabapentin  as prescribed. -Continue with chiropractic decompression therapy.  CHRONIC PAIN SYNDROME: Naltrexone was discontinued due to adverse effects. You are considering BPC-157 for musculoskeletal recovery. -Research BPC-157 and consult with pharmacists before starting. -Continue your current pain management regimen.  ANXIETY DISORDER: Your symptoms are well-managed, and you are decreasing your use of clonazepam . -Continue taking clonazepam  as needed and taper as you have been.  ESSENTIAL HYPERTENSION: Your blood pressure is improving but remains elevated. -Continue monitoring your blood pressure. -Make lifestyle changes, including regular exercise and a healthy diet.  Return in about 6 months (around 12/05/2024) for Annual Physical.   Take care, Dr Kennyth  PLEASE NOTE:  If you had any lab tests, please let us  know if you have not heard back within a few days. You may see your results on mychart before we have a chance to review them but we will give you a call once they are reviewed by us .   If we ordered any referrals today, please let us  know if you have not heard from their office within the next week.   If you had any urgent prescriptions sent in today, please check with the pharmacy within an hour of our visit to make sure the prescription was transmitted appropriately.   Please try these tips to maintain a healthy lifestyle:  Eat at least  3 REAL meals and 1-2 snacks per day.  Aim for no more than 5 hours between eating.  If you eat breakfast, please do so within one hour of getting up.   Each meal should contain half fruits/vegetables, one quarter protein, and one quarter carbs (no bigger than a computer mouse)  Cut down on sweet beverages. This includes juice, soda, and sweet tea.   Drink at least 1 glass of water with each meal and aim for at least 8 glasses per day  Exercise at least 150 minutes every week.

## 2024-06-12 ENCOUNTER — Ambulatory Visit: Admitting: Family Medicine

## 2024-06-12 ENCOUNTER — Encounter: Payer: Self-pay | Admitting: Family Medicine

## 2024-06-12 VITALS — BP 130/82 | HR 75 | Temp 98.1°F | Ht 71.0 in | Wt 225.0 lb

## 2024-06-12 DIAGNOSIS — F419 Anxiety disorder, unspecified: Secondary | ICD-10-CM | POA: Diagnosis not present

## 2024-06-12 DIAGNOSIS — M5417 Radiculopathy, lumbosacral region: Secondary | ICD-10-CM

## 2024-06-12 MED ORDER — CLONAZEPAM 0.5 MG PO TABS: 0.5000 mg | ORAL_TABLET | Freq: Three times a day (TID) | ORAL | 0 refills | Status: DC | PRN

## 2024-06-12 NOTE — Progress Notes (Signed)
 Geoffrey West is a 68 y.o. male who presents today for an office visit.  Assessment/Plan:  Chronic Problems Addressed Today: Radiculopathy, lumbosacral region Patient currently on gabapentin  though he is in the process of weaning off due to concern over potential side effects.  We did discuss strategy for weaning down.  He is currently on gabapentin  300 mg in the morning and 300 mg at night.  Did have some mild withdrawal effects yesterday.  Recommend he continue with his current regimen for the next few days and then decrease to 300 mg nightly for a few days and then 300 mg nightly every other day and then off.  He is aware of potential side effects.  He if he experiences significant withdrawal symptoms he can revert to the previous dosage and restart the cycle.  He will follow-up with us  in a week or 2 via MyChart.  Anxiety Overall symptoms are stable.  Has clonazepam  to use as needed though uses this very infrequently.  He will let us  know if he needs any further assistance with this.  We did discuss referral to see a therapist last week however he declined.     Subjective:  HPI:  See assessment / plan for status of chronic conditions.     Discussed the use of AI scribe software for clinical note transcription with the patient, who gave verbal consent to proceed.  History of Present Illness Geoffrey West is a 68 year old male who presents with side effects from gabapentin  and desires to discontinue its use.  He experiences significant side effects from gabapentin , including peripheral edema, weight gain, and ataxia. He notes a weight increase of about seven pounds over the last couple of weeks, which he attributes partly to the medication and partly to recent holiday eating. He typically maintains a weight of 210-212 pounds.  He has been taking gabapentin  at a dose of two pills in the morning and one at night. Recently, he attempted to reduce his dose by stopping one  pill in the evening and one in the morning due to the side effects. This reduction led to withdrawal symptoms, including elevated blood pressure and malaise, particularly in the afternoon. He describes experiencing anxiety and elevated blood pressure, which he associates with the withdrawal process.  He also takes clonazepam , which he uses sparingly to manage anxiety and help with sleep. He reports having five pills left and finds comfort in knowing he has it available if needed. He continues to take Celebrex , which he has been on for a long time.  His medical history includes knee replacement surgery and a previous evaluation by a rheumatologist for rheumatoid arthritis and polymyalgia rheumatica. He has tried various treatments, including prednisone , which he finds effective at 20 mg but not sustainable due to side effects. He has also explored alternative therapies such as chiropractic decompression, dry needling, and acupuncture, with varying degrees of success.         Objective:  Physical Exam: BP 130/82   Pulse 75   Temp 98.1 F (36.7 C) (Temporal)   Ht 5' 11 (1.803 m)   Wt 225 lb (102.1 kg)   SpO2 95%   BMI 31.38 kg/m   what Wt Readings from Last 3 Encounters:  06/12/24 225 lb (102.1 kg)  06/06/24 223 lb (101.2 kg)  05/30/24 218 lb 6.4 oz (99.1 kg)    Gen: No acute distress, resting comfortably Neuro: Grossly normal, moves all extremities Psych: Normal affect and thought  content      Mart Colpitts M. Kennyth, MD 06/12/2024 9:58 AM

## 2024-06-12 NOTE — Assessment & Plan Note (Signed)
 Overall symptoms are stable.  Has clonazepam  to use as needed though uses this very infrequently.  He will let us  know if he needs any further assistance with this.  We did discuss referral to see a therapist last week however he declined.

## 2024-06-12 NOTE — Assessment & Plan Note (Signed)
 Patient currently on gabapentin  though he is in the process of weaning off due to concern over potential side effects.  We did discuss strategy for weaning down.  He is currently on gabapentin  300 mg in the morning and 300 mg at night.  Did have some mild withdrawal effects yesterday.  Recommend he continue with his current regimen for the next few days and then decrease to 300 mg nightly for a few days and then 300 mg nightly every other day and then off.  He is aware of potential side effects.  He if he experiences significant withdrawal symptoms he can revert to the previous dosage and restart the cycle.  He will follow-up with us  in a week or 2 via MyChart.

## 2024-06-12 NOTE — Patient Instructions (Signed)
 It was very nice to see you today!  VISIT SUMMARY: Today, we discussed your side effects from gabapentin  and your desire to discontinue its use. We also addressed your anxiety and high blood pressure, which may be related to the gabapentin  withdrawal.  YOUR PLAN: GABAPENTIN  WITHDRAWAL MANAGEMENT: You are experiencing significant side effects from gabapentin , including weight gain, swelling, and an intoxicated feeling. Previous attempts to taper off the medication led to withdrawal symptoms. -Continue taking gabapentin  at two pills per day for three to five days. -If stable, reduce to one pill at night for a few days, then to one pill at night every other day. -If withdrawal symptoms return, revert to the previous dosage and restart the tapering cycle. -Provide a progress report in a few weeks.  ANXIETY: Your anxiety is worsened by gabapentin  withdrawal. Clonazepam  helps manage your anxiety and reduces your blood pressure. -A prescription for clonazepam  has been sent to your pharmacy for as-needed use.  ESSENTIAL HYPERTENSION: Your blood pressure is elevated, potentially due to gabapentin  withdrawal. Clonazepam  helps reduce your blood pressure when taken for anxiety. -Continue to monitor your blood pressure, especially during gabapentin  tapering.  Return if symptoms worsen or fail to improve.   Take care, Dr Kennyth  PLEASE NOTE:  If you had any lab tests, please let us  know if you have not heard back within a few days. You may see your results on mychart before we have a chance to review them but we will give you a call once they are reviewed by us .   If we ordered any referrals today, please let us  know if you have not heard from their office within the next week.   If you had any urgent prescriptions sent in today, please check with the pharmacy within an hour of our visit to make sure the prescription was transmitted appropriately.   Please try these tips to maintain a healthy  lifestyle:  Eat at least 3 REAL meals and 1-2 snacks per day.  Aim for no more than 5 hours between eating.  If you eat breakfast, please do so within one hour of getting up.   Each meal should contain half fruits/vegetables, one quarter protein, and one quarter carbs (no bigger than a computer mouse)  Cut down on sweet beverages. This includes juice, soda, and sweet tea.   Drink at least 1 glass of water with each meal and aim for at least 8 glasses per day  Exercise at least 150 minutes every week.

## 2024-06-15 ENCOUNTER — Other Ambulatory Visit: Payer: Self-pay

## 2024-06-15 ENCOUNTER — Emergency Department (HOSPITAL_BASED_OUTPATIENT_CLINIC_OR_DEPARTMENT_OTHER)
Admission: EM | Admit: 2024-06-15 | Discharge: 2024-06-15 | Disposition: A | Discharge status: Home / Self Care | Attending: Emergency Medicine | Admitting: Emergency Medicine

## 2024-06-15 ENCOUNTER — Encounter (HOSPITAL_BASED_OUTPATIENT_CLINIC_OR_DEPARTMENT_OTHER): Payer: Self-pay

## 2024-06-15 DIAGNOSIS — F419 Anxiety disorder, unspecified: Secondary | ICD-10-CM | POA: Diagnosis present

## 2024-06-15 DIAGNOSIS — Z79899 Other long term (current) drug therapy: Secondary | ICD-10-CM | POA: Insufficient documentation

## 2024-06-15 LAB — COMPREHENSIVE METABOLIC PANEL WITH GFR
ALT: 20 U/L (ref 0–44)
AST: 21 U/L (ref 15–41)
Albumin: 4.2 g/dL (ref 3.5–5.0)
Alkaline Phosphatase: 75 U/L (ref 38–126)
Anion gap: 11 (ref 5–15)
BUN: 15 mg/dL (ref 8–23)
CO2: 26 mmol/L (ref 22–32)
Calcium: 10.6 mg/dL — ABNORMAL HIGH (ref 8.9–10.3)
Chloride: 101 mmol/L (ref 98–111)
Creatinine, Ser: 1.43 mg/dL — ABNORMAL HIGH (ref 0.61–1.24)
GFR, Estimated: 54 mL/min — ABNORMAL LOW
Glucose, Bld: 130 mg/dL — ABNORMAL HIGH (ref 70–99)
Potassium: 3.6 mmol/L (ref 3.5–5.1)
Sodium: 139 mmol/L (ref 135–145)
Total Bilirubin: 0.8 mg/dL (ref 0.0–1.2)
Total Protein: 6.9 g/dL (ref 6.5–8.1)

## 2024-06-15 LAB — CBC WITH DIFFERENTIAL/PLATELET
Abs Immature Granulocytes: 0.01 K/uL (ref 0.00–0.07)
Basophils Absolute: 0.1 K/uL (ref 0.0–0.1)
Basophils Relative: 1 %
Eosinophils Absolute: 0.4 K/uL (ref 0.0–0.5)
Eosinophils Relative: 6 %
HCT: 48 % (ref 39.0–52.0)
Hemoglobin: 17.2 g/dL — ABNORMAL HIGH (ref 13.0–17.0)
Immature Granulocytes: 0 %
Lymphocytes Relative: 18 %
Lymphs Abs: 1.1 K/uL (ref 0.7–4.0)
MCH: 31.9 pg (ref 26.0–34.0)
MCHC: 35.8 g/dL (ref 30.0–36.0)
MCV: 89.1 fL (ref 80.0–100.0)
Monocytes Absolute: 0.8 K/uL (ref 0.1–1.0)
Monocytes Relative: 12 %
Neutro Abs: 4 K/uL (ref 1.7–7.7)
Neutrophils Relative %: 63 %
Platelets: 155 K/uL (ref 150–400)
RBC: 5.39 MIL/uL (ref 4.22–5.81)
RDW: 13.1 % (ref 11.5–15.5)
WBC: 6.4 K/uL (ref 4.0–10.5)
nRBC: 0 % (ref 0.0–0.2)

## 2024-06-15 MED ORDER — HYDROXYZINE HCL 25 MG PO TABS: 25.0000 mg | ORAL_TABLET | Freq: Three times a day (TID) | ORAL | 0 refills | Status: DC | PRN

## 2024-06-15 MED ORDER — HYDROXYZINE HCL 25 MG PO TABS
25.0000 mg | ORAL_TABLET | Freq: Once | ORAL | Status: AC
  Administered 2024-06-15: 25 mg via ORAL
  Filled 2024-06-15: qty 1

## 2024-06-15 NOTE — ED Triage Notes (Signed)
 Pt reports having withdrawals from Gabapentin . Pt reports he took some this AM with half a Klonopin . Pt reports increased anxiety as well.

## 2024-06-15 NOTE — ED Provider Notes (Signed)
 " [place] EMERGENCY DEPARTMENT AT [facility] Provider Note   CSN: [id number] Arrival date & time: 06/15/24  1157     Patient presents with: Withdrawal and Anxiety   Geoffrey West is a 68 y.o. male.   Patient here for anxiety symptoms.  He has tried to wean off the gabapentin  here recently but that seemed to make things worse.  He is on 300 twice a day working on getting off this medicine.  He is on Klonopin  as well as needed.  He denies any suicidal thoughts or plan but has had suicidal thoughts in the past.  He states that he would never hurt himself.  He states that he would seek help if this ever got to that problem.  He has seen psychiatry in the past but not recently.  Primary care doctor has been working on his anxiety and just recently given med clonazepam  for this.  He denies any chest pain shortness of breath weakness numbness tingling.  He really describes intense anxiety symptoms at times.  Right now he is feeling better.  He would like to be on meds that are nondependent/known cause dependency.  The history is provided by the patient.       Prior to Admission medications  Medication Sig Start Date End Date Taking? Authorizing Provider  hydrOXYzine  (ATARAX ) 25 MG tablet Take 1 tablet (25 mg total) by mouth every 8 (eight) hours as needed for up to 30 doses for anxiety. 06/15/24  Yes Temika Sutphin, DO  amLODipine  (NORVASC ) 10 MG tablet Take 1 tablet (10 mg total) by mouth at bedtime. 11/06/23   Kennyth Worth HERO, MD  ARMOUR THYROID  60 MG tablet 1 tablet on an empty stomach Orally Once a day for 90 days 07/09/19   [provider]  celecoxib  (CELEBREX ) 200 MG capsule TAKE 1 CAPSULE BY MOUTH 2 TIMES A DAY 05/21/24   Kennyth Worth HERO, MD  clonazePAM  (KLONOPIN ) 0.5 MG tablet Take 1 tablet (0.5 mg total) by mouth 3 (three) times daily as needed for anxiety. 06/12/24   Kennyth Worth HERO, MD  diphenhydramine -acetaminophen  (TYLENOL  PM) 25-500 MG TABS tablet Take 1 tablet  by mouth at bedtime as needed (sleep).    [provider]  fluticasone  (FLONASE ) 50 MCG/ACT nasal spray Place 2 sprays into both nostrils daily. 05/30/24   Kennyth Worth HERO, MD  NEEDLE, DISP, 18 G (B-D HYPODERMIC NEEDLE 18GX1.5) 18G X 1-1/2 MISC Hypodermic Needles 18 gauge x 1 1/2  USE AS DIRECTED WITH TESTOSTERONE     [provider]  pantoprazole  (PROTONIX ) 40 MG tablet TAKE 1 TABLET BY MOUTH EVERY MORNING 12/06/23   Kennyth Worth HERO, MD  propranolol  ER (INDERAL  LA) 80 MG 24 hr capsule TAKE ONE CAPSULE BY MOUTH EVERY EVENING 01/10/24   Kennyth Worth HERO, MD  testosterone  cypionate (DEPOTESTOSTERONE CYPIONATE) 200 MG/ML injection Inject 140 mg into the muscle every Wednesday.     [provider]    Allergies: Fentanyl , Gabapentin , Pregabalin, and Oxycodone    Review of Systems  Updated Vital Signs BP (!) 161/103   Pulse 83   Temp (!) 97.4 F (36.3 C) (Oral)   Resp 18   Ht 5' 11 (1.803 m)   Wt 98.4 kg   SpO2 100%   BMI 30.27 kg/m   Physical Exam Vitals and nursing note reviewed.  Constitutional:      General: He is not in acute distress.    Appearance: He is well-developed. He is not ill-appearing.  HENT:  Head: Normocephalic and atraumatic.     Nose: Nose normal.     Mouth/Throat:     Mouth: Mucous membranes are moist.  Eyes:     Extraocular Movements: Extraocular movements intact.     Conjunctiva/sclera: Conjunctivae normal.     Pupils: Pupils are equal, round, and reactive to light.  Cardiovascular:     Rate and Rhythm: Normal rate and regular rhythm.     Pulses: Normal pulses.     Heart sounds: Normal heart sounds. No murmur heard. Pulmonary:     Effort: Pulmonary effort is normal. No respiratory distress.     Breath sounds: Normal breath sounds.  Abdominal:     General: Abdomen is flat.     Palpations: Abdomen is soft.     Tenderness: There is no abdominal tenderness.  Musculoskeletal:        General: No swelling.     Cervical back:  Normal range of motion and neck supple.  Skin:    General: Skin is warm and dry.     Capillary Refill: Capillary refill takes less than 2 seconds.  Neurological:     General: No focal deficit present.     Mental Status: He is alert.     Comments: Normal strength and sensation  Psychiatric:     Comments: Patient is anxious is little tearful but he denies SI HI     (all labs ordered are listed, but only abnormal results are displayed) Labs Reviewed  CBC WITH DIFFERENTIAL/PLATELET - Abnormal; Notable for the following components:      Result Value   Hemoglobin 17.2 (*)    All other components within normal limits  COMPREHENSIVE METABOLIC PANEL WITH GFR - Abnormal; Notable for the following components:   Glucose, Bld 130 (*)    Creatinine, Ser 1.43 (*)    Calcium  10.6 (*)    GFR, Estimated 54 (*)    All other components within normal limits    EKG: None  Radiology: No results found.   Procedures   Medications Ordered in the ED  hydrOXYzine  (ATARAX ) tablet 25 mg (25 mg Oral Given 06/15/24 1235)                                    Medical Decision Making Amount and/or Complexity of Data Reviewed Labs: ordered.  Risk Prescription drug management.   Geoffrey West is here with anxiety.  Unremarkable vitals.  No fever.  He has been trying to wean himself off the gabapentin  with his primary care doctor but when he tried to make the wean here recently symptoms got more intense.  He is back to 300 mg of gabapentin  twice a day.  He is on clonazepam  as needed as well.  Seems like he suffers from panic attack but I do think he does have some generalized anxiety as well.  Physically seems like he is doing well.  Basic labs are unremarkable.  He is not endorsing any SI or HI.  I will give him a prescription for Atarax  to use as needed as well.  He would like to try to avoid any meds that cause dependency or need to be weaned off.  Ultimately I think he would benefit from seeing a  behavioral health counselor maybe a psychiatrist as well.  Given resources for behavioral health urgent care and told him to ask for outpatient resources but also made them aware that if  he ever develops severe symptoms that he should go there or to the ED.  I was able to contract for safety with him.  He is not feeling suicidal.  He does have some bad thoughts at times but he does not think he would ever act on them.  Will have him follow-up closely with his primary care doctor to continue wean him off of the gabapentin .  Discharged in good condition.  I have no concern for physical process at this time.  This chart was dictated using voice recognition software.  Despite best efforts to proofread,  errors can occur which can change the documentation meaning.      Final diagnoses:  Anxiety    ED Discharge Orders          Ordered    hydrOXYzine  (ATARAX ) 25 MG tablet  Every 8 hours PRN        06/15/24 1333               Ruthe Cornet, DO 06/15/24 1335  "

## 2024-06-16 ENCOUNTER — Encounter: Payer: Self-pay | Admitting: Physician Assistant

## 2024-06-16 ENCOUNTER — Ambulatory Visit: Payer: Self-pay

## 2024-06-16 ENCOUNTER — Telehealth: Payer: Self-pay | Admitting: Family Medicine

## 2024-06-16 ENCOUNTER — Ambulatory Visit: Admitting: Physician Assistant

## 2024-06-16 VITALS — BP 152/70 | HR 87 | Temp 97.8°F | Ht 71.0 in | Wt 222.8 lb

## 2024-06-16 DIAGNOSIS — F419 Anxiety disorder, unspecified: Secondary | ICD-10-CM

## 2024-06-16 DIAGNOSIS — T887XXA Unspecified adverse effect of drug or medicament, initial encounter: Secondary | ICD-10-CM | POA: Diagnosis not present

## 2024-06-16 MED ORDER — GABAPENTIN 100 MG PO CAPS: ORAL_CAPSULE | ORAL | 0 refills | Status: DC

## 2024-06-16 NOTE — Telephone Encounter (Signed)
 FYI Only or Action Required?: FYI only for provider: appointment scheduled on 06/16/2024 at 1:20 PM.  Patient was last seen in primary care on 06/12/2024 by Kennyth Worth HERO, MD.  Called Nurse Triage reporting Medication Problem.  Symptoms began several days ago.  Interventions attempted: Rest, hydration, or home remedies.  Symptoms are: unchanged.  Triage Disposition: Call PCP Now  Patient/caregiver understands and will follow disposition?: Yes  Copied from CRM #[id number]. Topic: Clinical - Red Word Triage >> Jun 16, 2024  8:52 AM Charlet HERO wrote: Red Word that prompted transfer to Nurse Triage: Patient is calling about withdrawal symptoms , hot and cold flashes, anxiety and feeling out of his skin went to the ER the weekend and would like to speak toDr to get dosing correct. Parker. Reason for Disposition  [1] Caller has URGENT medicine question about med that primary care doctor (or NP/PA) or specialist prescribed AND [2] triager unable to answer question  Answer Assessment - Initial Assessment Questions Patient was seen in the ED yesterday for side effects of withdrawing from Gabapentin . Patient reports hot and cold flashes, anxiety and feeling like he is coming out of his skin. Patient is currently feeling well-no symptoms of withdrawal today. Patient is scheduled to see a provider in the office today at 1:20 PM.  1. NAME of MEDICINE: What medicine(s) are you calling about?     Gabapentin  2. QUESTION: What is your question? (e.g., double dose of medicine, side effect)     Side effects of withdrawing from gabapentin -hot and cold flashes, anxiety and feeling like he was coming out of his skin 3. PRESCRIBER: Who prescribed the medicine? Reason: if prescribed by specialist, call should be referred to that group.     [place]arker 4. SYMPTOMS: Do you have any symptoms? If Yes, ask: What symptoms are you having?  How bad are the symptoms (e.g., mild, moderate, severe)     Moderate to  severe  Protocols used: Medication Question Call-A-AH

## 2024-06-16 NOTE — Patient Instructions (Addendum)
 Take 300 mg in am and pm x 5 days, then  200 mg in am and 300 mg in pm x 5 days, then  200 mg in am and pm x 5 days, then  100 mg in am and 200 mg in pm x 5 days,  then 100 mg in am and pm x 5 days, then  only take 100 mg in pm x 5 days, then  off.  Please follow up with Kennyth if any further concerns.

## 2024-06-16 NOTE — Telephone Encounter (Signed)
 Pt completed OV with Lucie Buttner today, nothing further needed at this time

## 2024-06-16 NOTE — Telephone Encounter (Signed)
 Was seen in [facility] on 06/15/24.   Patient Name First: Geoffrey Last: The [place] West Gender: Male DOB: Jul 08, 1955 Age: 67 Y 9 M 26 D Return Phone Number: [phone] (Primary) Address: City/ State/ Zip: [place]  [address] Client [facility] [employer] Provider Geoffrey West- MD Contact Type Call Who Is Calling Patient / Member / Family / Caregiver Call Type Triage / Clinical Caller Name Geoffrey West Relationship To Patient Self Return Phone Number [phone] (Primary) Chief Complaint SUICIDE - threatening harm to self or others Reason for Call Symptomatic / Request for Health Information Initial Comment Caller states he is having issues coming off the Gabapentin . He is having withdrawal symptoms. He has anxiety, hot/cold flashes, feet swelling, and suicidal thoughts. Translation No Nurse Assessment Nurse: Johnetta, RN, Damien Date/Time Titus Time): 06/14/2024 2:09:56 PM Confirm and document reason for call. If symptomatic, describe symptoms. ---Caller states he saw his PCP a couple of days ago and provider advised him on a schedule to wean his gabapentin  300mg  capsules. He was to take 2 capsules QAM, 1 cap mid-day, then 2 caps QHS; then decrease to then 1QAM and 1QHS; then decrease to only nightly dosing. He has been on 1 capsule BID for the past 3 days and omitted his morning dose today. States he felt okay yesterday, but today he is having hot and cold flashes, feels anxious, has pedal edema, and has felt that he doesn't want to be here anymore. States he has no intent to harm himself but states that you just get tired of hurting and feeling bad. He has PRN clonzepam and took a dose but states it did not help. 2 QAM, 1 mid-day, then 2 QHS; Does the patient have any new or worsening symptoms? ---Yes Will a triage be completed? ---Yes Related visit to physician within the  last 2 weeks? ---Yes Does the PT have any chronic conditions? (i.e. diabetes, asthma, this includes High risk factors for pregnancy, etc.) ---Yes List chronic conditions. ---chronic back pain and sciatica  Nurse Assessment Is this a behavioral health or substance abuse call? ---Yes Are you having any thoughts or feelings of harming or killing yourself or someone else? ---Yes Do you have a weapon with you? ---No Are you alone? ---No Are you currently experiencing any physical discomfort that you think may be related to the use of alcohol  or other drugs? (use substance abuse or alcohol  abuse guidelines. These include withdrawal symptoms) ---No Do you worry that you may be hearing or seeing things that others do not? ---No Do you take medications for your condition(s)? ---Yes List medications here. ---clonazepam  Guidelines Guideline Title Affirmed Question Affirmed Notes Nurse Date/Time (Eastern Time) Suicide Concerns Depression symptoms (sadness, hopelessness, decreased energy) interfere with work or school [place], CHARITY FUNDRAISER, Damien 06/14/2024 2:15:27 PM Disp. Time Titus Time) Disposition Final User 06/14/2024 2:03:47 PM Send to Urgent Queue Alena Sawyer 06/14/2024 2:23:59 PM See PCP within 24 Hours Yes Johnetta, RN, Damien Final Disposition 06/14/2024 2:23:59 PM See PCP within 24 Hours Yes Johnetta, RN, Damien Caller Disagree/Comply Comply Caller Understands Yes PreDisposition Did not know what to do Care Advice Given Per Guideline SEE PCP WITHIN 24 HOURS: * IF OFFICE WILL BE CLOSED: You need to be seen within the next 24 hours. [facility] or an urgent care center is often a good source of care if your doctor's office is closed or you can't get an appointment. CALL BACK IF: *  You feel like harming yourself * You become worse CARE ADVICE given per Suicide Concerns (Adult) guideline.  Comments User: Damien Rubin, RN Date/Time Titus Time): 06/14/2024 2:25:57 PM Caller states  he has taken clonidine in the past when he experienced withdrawal symptoms with previous medications taken for his back pain and is wondering if this med may be an option to try. Advised caller, per directive, that medications are not prescribed after hours and requested he contact office on Monday to discuss his weaning plan further and additional options. Advised to be seen w/in 24hr per triage outcome based on current symptoms. Referrals GO TO FACILITY UNDECIDED

## 2024-06-16 NOTE — Progress Notes (Signed)
 "  History of Present Illness:   Chief Complaint  Patient presents with   Medication Problem    Having withdraws from gabapentin . Saw Dr. Kennyth last Thursday. Starting tapering meds. Down to 1 pill in am 1 pill in evening. Saturday anxiety was bad, cold, skin was crawling. Took clonazepam  and Gabapentin  early afternoon and at night. Sunday woke early with jitters, took 1/2 clonazepam  and Gabapentin . Didn't work and ended up in ER at [facility]    Discussed the use of AI scribe software for clinical note transcription with the patient, who gave verbal consent to proceed.  History of Present Illness   Geoffrey West is a 68 year old male who presents with withdrawal symptoms from gabapentin .  He tapered gabapentin  from 600 mg daily (300 mg morning and 300 mg night) to 300 mg daily over four days, then developed severe withdrawal with anxiety, feeling cold, and balance problems on day 4, so he returned to 600 mg daily. He also notes swollen ankles, weight gain, finger twitching, and nighttime imbalance that feel like intoxication, which he attributes to gabapentin  and which led him to attempt tapering.  Today he is taking gabapentin  300 mg in the morning and 300 mg at night. He tried clonazepam  for withdrawal symptoms without benefit. He is motivated to discontinue gabapentin  due to these side effects and concern about dependence.  He went to the ER yesterday for severe withdrawal. He was given hydroxyzine  (Atarax ) for anxiety, which was effective there. He now uses hydroxyzine  tablets as needed for anxiety, though he feels they help less at home. He denies pain but continues to have anxiety and cold sensations with withdrawal.        Past Medical History:  Diagnosis Date   Allergy    Anxiety    Arthritis    all over (03/06/2018)   Close exposure to COVID-19 virus 07/08/2019   Encounter for orthopedic follow-up care 12/28/2017   GERD (gastroesophageal reflux disease)    Gout     on daily RX (03/06/2018)   History of kidney stones     passed x 1   Hypertension    Hypertension 12/25/2016   Loosening of total shoulder replacement    left   Low testosterone  12/25/2016   Migraine    haven't had more than 1/year since propanolol started (03/06/2018)   Peripherally inserted central venous catheter in situ 08/07/2018   Propionibacterium infection 08/28/2019   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   Rash 09/17/2019   Situational depression    recently lost my mom and brother (03/06/2018)   Wears contact lenses    Wears glasses      Social History[1]  Past Surgical History:  Procedure Laterality Date   BACK SURGERY     COLONOSCOPY     COLONOSCOPY W/ BIOPSIES AND POLYPECTOMY     EXCISIONAL TOTAL SHOULDER ARTHROPLASTY WITH ANTIBIOTIC SPACER Left 03/06/2018   Procedure: Left shoulder antibiotic spacer expalnt with glenoid bone grafting, antiobiotic spacer placement;  Surgeon: Sharl Selinda Dover, MD;  Location: [facility] OR;  Service: Orthopedics;  Laterality: Left;   EXCISIONAL TOTAL SHOULDER ARTHROPLASTY WITH ANTIBIOTIC SPACER Left 01/06/2020   Procedure: LEFT SHOULDER ARTHROPLASTY EXPLANT WITH ANTIBIOTIC SPACER PLACEMENT;  Surgeon: Sharl Selinda Dover, MD;  Location: [facility] OR;  Service: Orthopedics;  Laterality: Left;  2.5 hrs RNFA   EYE SURGERY     IR FLUORO GUIDE CV LINE RIGHT  12/26/2016   IRRIGATION AND DEBRIDEMENT SHOULDER Left 05/27/2020   Procedure: Left shoulder  antibiotic spacer exchange;  Surgeon: Sharl Selinda Dover, MD;  Location: [facility] OR;  Service: Orthopedics;  Laterality: Left;  120 mins   JOINT REPLACEMENT     KNEE ARTHROSCOPY Bilateral    X 10 total arthroscopies   KNEE CARTILAGE SURGERY Left 1977   cartilage removed   LASIK Bilateral    LUMBAR DISC SURGERY  2000s   ruptured disc   REVERSE SHOULDER ARTHROPLASTY Left 12/08/2016   Procedure: Revision left total shoulder to reverse total shoulder arthroplasty;  Surgeon: Sharl Selinda Dover, MD;  Location: [facility] OR;  Service: Orthopedics;  Laterality: Left;   REVERSE SHOULDER ARTHROPLASTY Left 01/01/2018   Procedure: Left reverse shouler explant with antibiotic spacer placement;  Surgeon: Sharl Selinda Dover, MD;  Location: [facility] ORS;  Service: Orthopedics;  Laterality: Left;  2.5 hrs   REVERSE SHOULDER ARTHROPLASTY Left 06/11/2018   Procedure: REVERSE TOTAL SHOULDER REVISION;  Surgeon: Sharl Selinda Dover, MD;  Location: [facility] OR;  Service: Orthopedics;  Laterality: Left;   REVERSE SHOULDER ARTHROPLASTY Left 12/05/2018   Procedure: Left reverse shoulder explant, bone grafting;  Surgeon: Sharl Selinda Dover, MD;  Location: [facility] OR;  Service: Orthopedics;  Laterality: Left;  2.5 hrs   REVISION OF TOTAL SHOULDER Left 12/26/2018   REVISION TOTAL SHOULDER TO REVERSE TOTAL SHOULDER Left 07/16/2018   Procedure: Left shoulder irrigation and debridement with poly and head ball exchange;  Surgeon: Sharl Selinda Dover, MD;  Location: [facility] OR;  Service: Orthopedics;  Laterality: Left;  2 hrs   SHOULDER ARTHROSCOPY Right 1980s/1990s   bone spur removed   SHOULDER OPEN ROTATOR CUFF REPAIR Right 1970s   SHOULDER SURGERY Left 03/06/2018   antibiotic spacer expalnt with glenoid bone grafting, antiobiotic spacer placement   SPINE SURGERY     TOTAL KNEE ARTHROPLASTY Bilateral    TOTAL SHOULDER ARTHROPLASTY Left ~ 2014-2017 X 2   in [place]   TOTAL SHOULDER REPLACEMENT Right ~ 2009   in [place]   TOTAL SHOULDER REVISION Left 12/26/2018   Procedure: Left prosthetic shoulder 1 component revision;  Surgeon: Sharl Selinda Dover, MD;  Location: [facility] OR;  Service: Orthopedics;  Laterality: Left;  90 mins   WISDOM TOOTH EXTRACTION      Family History  Problem Relation Age of Onset   Arthritis Mother    Arthritis Sister    Alzheimer's disease Brother    Arthritis Brother     Allergies[2]  Current Medications:  Current Medications[3]   Review of Systems:   Negative unless  otherwise specified per HPI.  Vitals:   Vitals:   06/16/24 1328  BP: (!) 152/70  Pulse: 87  Temp: 97.8 F (36.6 C)  TempSrc: Temporal  SpO2: 98%  Weight: 222 lb 12.8 oz (101.1 kg)  Height: 5' 11 (1.803 m)     Body mass index is 31.07 kg/m.  Physical Exam:   Physical Exam Vitals and nursing note reviewed.  Constitutional:      Appearance: He is well-developed.  HENT:     Head: Normocephalic.  Eyes:     Conjunctiva/sclera: Conjunctivae normal.     Pupils: Pupils are equal, round, and reactive to light.  Pulmonary:     Effort: Pulmonary effort is normal.  Musculoskeletal:        General: Normal range of motion.     Cervical back: Normal range of motion.  Skin:    General: Skin is warm and dry.  Neurological:     Mental Status: He is alert and oriented to person, place, and  time.  Psychiatric:        Behavior: Behavior normal.        Thought Content: Thought content normal.        Judgment: Judgment normal.     Assessment and Plan:   Assessment and Plan  Medication side effect(s)  Withdrawal symptoms from gabapentin  tapering include anxiety, cold sweats, and balance issues. Gabapentin  initially prescribed for pain management caused side effects. Sensitive to medications and concerned about dependency. - Prescribed 100 mg gabapentin  capsules for tapering. - Instructed as follows: Take 300 mg in am and pm x 5 days, then  200 mg in am and 300 mg in pm x 5 days, then  200 mg in am and pm x 5 days, then  100 mg in am and 200 mg in pm x 5 days,  then 100 mg in am and pm x 5 days, then  only take 100 mg in pm x 5 days, then  off. - Advised to contact if tapering is ineffective for potential referral to addiction specialist.  Generalized anxiety disorder Anxiety worsened by gabapentin  withdrawal. Hydroxyzine  prescribed for as-needed use. Sensitive to medications and prefers non-dependency options. - Continue hydroxyzine  25 mg tablets as needed for anxiety. -  Advised to cut tablet in half if sedation occurs during the day.      I personally spent a total of 36 minutes in the care of the patient today including preparing to see the patient, getting/reviewing separately obtained history, performing a medically appropriate exam/evaluation, counseling and educating, placing orders, and reviewing ER note .   Lucie Buttner, PA-C    [1]  Social History Tobacco Use   Smoking status: Never   Smokeless tobacco: Never  Vaping Use   Vaping status: Never Used  Substance Use Topics   Alcohol  use: Not Currently   Drug use: Never  [2]  Allergies Allergen Reactions   Fentanyl  Shortness Of Breath and Other (See Comments)    Reaction to [facility] ONLY > ? DOSE REGULATION ?    Gabapentin  Other (See Comments)   Pregabalin Other (See Comments)   Oxycodone Other (See Comments)    Pt states he goes through hot and cold flashes withdrawal like symptoms  [3]  Current Outpatient Medications:    amLODipine  (NORVASC ) 10 MG tablet, Take 1 tablet (10 mg total) by mouth at bedtime., Disp: 90 tablet, Rfl: 3   ARMOUR THYROID  60 MG tablet, 1 tablet on an empty stomach Orally Once a day for 90 days, Disp: , Rfl:    celecoxib  (CELEBREX ) 200 MG capsule, TAKE 1 CAPSULE BY MOUTH 2 TIMES A DAY, Disp: 60 capsule, Rfl: 0   clonazePAM  (KLONOPIN ) 0.5 MG tablet, Take 1 tablet (0.5 mg total) by mouth 3 (three) times daily as needed for anxiety., Disp: 9 tablet, Rfl: 0   diphenhydramine -acetaminophen  (TYLENOL  PM) 25-500 MG TABS tablet, Take 1 tablet by mouth at bedtime as needed (sleep)., Disp: , Rfl:    fluticasone  (FLONASE ) 50 MCG/ACT nasal spray, Place 2 sprays into both nostrils daily., Disp: 16 mL, Rfl: 0   gabapentin  (NEURONTIN ) 100 MG capsule, Take 300 mg in am and pm x 5 days, then 200 mg in am and 300 mg in pm x 5 days, then 200 mg in am and pm x 5 days, then 100 mg in am and 200 mg in pm x 5 days, then 100 mg in am and pm x 5 days, then only take 100 mg in pm x 5 days, then  off., Disp: 90 capsule, Rfl: 0   hydrOXYzine  (ATARAX ) 25 MG tablet, Take 1 tablet (25 mg total) by mouth every 8 (eight) hours as needed for up to 30 doses for anxiety., Disp: 30 tablet, Rfl: 0   NEEDLE, DISP, 18 G (B-D HYPODERMIC NEEDLE 18GX1.5) 18G X 1-1/2 MISC, Hypodermic Needles 18 gauge x 1 1/2  USE AS DIRECTED WITH TESTOSTERONE , Disp: , Rfl:    pantoprazole  (PROTONIX ) 40 MG tablet, TAKE 1 TABLET BY MOUTH EVERY MORNING, Disp: 90 tablet, Rfl: 3   propranolol  ER (INDERAL  LA) 80 MG 24 hr capsule, TAKE ONE CAPSULE BY MOUTH EVERY EVENING, Disp: 90 capsule, Rfl: 1   testosterone  cypionate (DEPOTESTOSTERONE CYPIONATE) 200 MG/ML injection, Inject 140 mg into the muscle every Wednesday. , Disp: , Rfl: 5  "

## 2024-06-17 ENCOUNTER — Other Ambulatory Visit: Payer: Self-pay

## 2024-06-17 ENCOUNTER — Encounter (HOSPITAL_BASED_OUTPATIENT_CLINIC_OR_DEPARTMENT_OTHER): Payer: Self-pay

## 2024-06-17 ENCOUNTER — Emergency Department (HOSPITAL_BASED_OUTPATIENT_CLINIC_OR_DEPARTMENT_OTHER)
Admission: EM | Admit: 2024-06-17 | Discharge: 2024-06-17 | Disposition: A | Discharge status: Home / Self Care | Attending: Emergency Medicine | Admitting: Emergency Medicine

## 2024-06-17 DIAGNOSIS — M5117 Intervertebral disc disorders with radiculopathy, lumbosacral region: Secondary | ICD-10-CM | POA: Diagnosis not present

## 2024-06-17 DIAGNOSIS — Z79899 Other long term (current) drug therapy: Secondary | ICD-10-CM | POA: Insufficient documentation

## 2024-06-17 DIAGNOSIS — F13939 Sedative, hypnotic or anxiolytic use, unspecified with withdrawal, unspecified: Secondary | ICD-10-CM | POA: Diagnosis not present

## 2024-06-17 DIAGNOSIS — F419 Anxiety disorder, unspecified: Secondary | ICD-10-CM

## 2024-06-17 DIAGNOSIS — I10 Essential (primary) hypertension: Secondary | ICD-10-CM | POA: Diagnosis not present

## 2024-06-17 DIAGNOSIS — R45851 Suicidal ideations: Secondary | ICD-10-CM | POA: Diagnosis present

## 2024-06-17 LAB — COMPREHENSIVE METABOLIC PANEL WITH GFR
ALT: 17 U/L (ref 0–44)
AST: 21 U/L (ref 15–41)
Albumin: 4 g/dL (ref 3.5–5.0)
Alkaline Phosphatase: 75 U/L (ref 38–126)
Anion gap: 13 (ref 5–15)
BUN: 14 mg/dL (ref 8–23)
CO2: 24 mmol/L (ref 22–32)
Calcium: 10.1 mg/dL (ref 8.9–10.3)
Chloride: 102 mmol/L (ref 98–111)
Creatinine, Ser: 1.52 mg/dL — ABNORMAL HIGH (ref 0.61–1.24)
GFR, Estimated: 50 mL/min — ABNORMAL LOW
Glucose, Bld: 121 mg/dL — ABNORMAL HIGH (ref 70–99)
Potassium: 3.8 mmol/L (ref 3.5–5.1)
Sodium: 138 mmol/L (ref 135–145)
Total Bilirubin: 0.7 mg/dL (ref 0.0–1.2)
Total Protein: 6.6 g/dL (ref 6.5–8.1)

## 2024-06-17 LAB — URINE DRUG SCREEN
Amphetamines: NEGATIVE
Barbiturates: NEGATIVE
Benzodiazepines: NEGATIVE
Cocaine: NEGATIVE
Fentanyl: NEGATIVE
Methadone Scn, Ur: NEGATIVE
Opiates: NEGATIVE
Tetrahydrocannabinol: NEGATIVE

## 2024-06-17 LAB — CBC
HCT: 50.1 % (ref 39.0–52.0)
Hemoglobin: 17.2 g/dL — ABNORMAL HIGH (ref 13.0–17.0)
MCH: 31.1 pg (ref 26.0–34.0)
MCHC: 34.3 g/dL (ref 30.0–36.0)
MCV: 90.6 fL (ref 80.0–100.0)
Platelets: 156 K/uL (ref 150–400)
RBC: 5.53 MIL/uL (ref 4.22–5.81)
RDW: 13 % (ref 11.5–15.5)
WBC: 6.8 K/uL (ref 4.0–10.5)
nRBC: 0 % (ref 0.0–0.2)

## 2024-06-17 LAB — ETHANOL: Alcohol, Ethyl (B): <15 mg/dL

## 2024-06-17 NOTE — ED Triage Notes (Signed)
 Patient reports feeling withdrawal symptoms from a gabapentin  taper. He says that he also has been taking hydroxyzine  rather than his klonopin . He reports anxiety, chills, some passive SI with no plan. He mentioned last time possibly going to [facility].

## 2024-06-17 NOTE — ED Provider Notes (Signed)
 " [place] EMERGENCY DEPARTMENT AT [facility] Provider Note   CSN: [id number] Arrival date & time: 06/17/24  1706     Patient presents with: Withdrawal   Geoffrey West is a 68 y.o. male.  With a history of anxiety who presents to the ED for reported withdrawal.  Patient currently tapering off of gabapentin  as he did not like the adverse effects or weight gain which he experienced after starting the medication.  He has had increased anxiety related to tapering off this medication and feels as though his he is withdrawing.  Endorses vague SI stating that he would be okay if it all ended.  No specific plan and no attempt to harm self or overdose.  Denies HI.  Has previously spoken with someone at behavioral health who recommended he come here for further evaluation given concern for potential medication withdrawal   HPI     Prior to Admission medications  Medication Sig Start Date End Date Taking? Authorizing Provider  amLODipine  (NORVASC ) 10 MG tablet Take 1 tablet (10 mg total) by mouth at bedtime. 11/06/23   Kennyth Worth HERO, MD  ARMOUR THYROID  60 MG tablet 1 tablet on an empty stomach Orally Once a day for 90 days 07/09/19   [provider]  celecoxib  (CELEBREX ) 200 MG capsule TAKE 1 CAPSULE BY MOUTH 2 TIMES A DAY 05/21/24   Kennyth Worth HERO, MD  clonazePAM  (KLONOPIN ) 0.5 MG tablet Take 1 tablet (0.5 mg total) by mouth 3 (three) times daily as needed for anxiety. 06/12/24   Kennyth Worth HERO, MD  diphenhydramine -acetaminophen  (TYLENOL  PM) 25-500 MG TABS tablet Take 1 tablet by mouth at bedtime as needed (sleep).    [provider]  fluticasone  (FLONASE ) 50 MCG/ACT nasal spray Place 2 sprays into both nostrils daily. 05/30/24   Kennyth Worth HERO, MD  gabapentin  (NEURONTIN ) 100 MG capsule Take 300 mg in am and pm x 5 days, then 200 mg in am and 300 mg in pm x 5 days, then 200 mg in am and pm x 5 days, then 100 mg in am and 200 mg in pm x 5 days, then 100 mg in am and  pm x 5 days, then only take 100 mg in pm x 5 days, then off. 06/16/24   [name], PA  hydrOXYzine  (ATARAX ) 25 MG tablet Take 1 tablet (25 mg total) by mouth every 8 (eight) hours as needed for up to 30 doses for anxiety. 06/15/24   Curatolo, Adam, DO  NEEDLE, DISP, 18 G (B-D HYPODERMIC NEEDLE 18GX1.5) 18G X 1-1/2 MISC Hypodermic Needles 18 gauge x 1 1/2  USE AS DIRECTED WITH TESTOSTERONE     [provider]  pantoprazole  (PROTONIX ) 40 MG tablet TAKE 1 TABLET BY MOUTH EVERY MORNING 12/06/23   Kennyth Worth HERO, MD  propranolol  ER (INDERAL  LA) 80 MG 24 hr capsule TAKE ONE CAPSULE BY MOUTH EVERY EVENING 01/10/24   Kennyth Worth HERO, MD  testosterone  cypionate (DEPOTESTOSTERONE CYPIONATE) 200 MG/ML injection Inject 140 mg into the muscle every Wednesday.     [provider]    Allergies: Fentanyl , Gabapentin , Pregabalin, and Oxycodone    Review of Systems  Updated Vital Signs BP (!) 159/104 (BP Location: Right Arm)   Pulse 79   Temp 98.4 F (36.9 C)   Resp 17   SpO2 98%   Physical Exam Vitals and nursing note reviewed.  HENT:     Head: Normocephalic and atraumatic.  Eyes:     Pupils: Pupils  are equal, round, and reactive to light.  Cardiovascular:     Rate and Rhythm: Normal rate and regular rhythm.  Pulmonary:     Effort: Pulmonary effort is normal.     Breath sounds: Normal breath sounds.  Abdominal:     Palpations: Abdomen is soft.     Tenderness: There is no abdominal tenderness.  Skin:    General: Skin is warm and dry.  Neurological:     Mental Status: He is alert.  Psychiatric:        Mood and Affect: Mood normal.     Comments: Anxious     (all labs ordered are listed, but only abnormal results are displayed) Labs Reviewed  COMPREHENSIVE METABOLIC PANEL WITH GFR - Abnormal; Notable for the following components:      Result Value   Glucose, Bld 121 (*)    Creatinine, Ser 1.52 (*)    GFR, Estimated 50 (*)    All other components within normal  limits  CBC - Abnormal; Notable for the following components:   Hemoglobin 17.2 (*)    All other components within normal limits  ETHANOL  URINE DRUG SCREEN    EKG: None  Radiology: No results found.   Procedures   Medications Ordered in the ED - No data to display  Clinical Course as of 06/17/24 2235  Tue Jun 17, 2024  2135 psych [MP]    Clinical Course User Index [MP] Pamella Ozell LABOR, DO                                 Medical Decision Making 68 year old male with history as above presents to the ED for concern of gabapentin  withdrawal as he was tapering down.  Significantly anxious.  Does not appear to be in acute withdrawal that would require medical admission or benzodiazepine administration.  Medically cleared for psychiatric evaluation.  I discussed with psychiatry team who has cleared him for discharge after interview for outpatient resources with behavioral health.  Will instruct him to continue hydroxyzine  as scheduled intervals to help with anxiety as he is tapering off of gabapentin  and follow-up with PCP.  Appropriate for discharge at this time.  Amount and/or Complexity of Data Reviewed Labs: ordered.        Final diagnoses:  Anxiety  Withdrawal from sedative, hypnotic, or anxiolytic drug [facility])    ED Discharge Orders     None          Pamella Ozell LABOR, DO 06/17/24 2235  "

## 2024-06-17 NOTE — Telephone Encounter (Signed)
 Patient had a F/U appt on 06/16/2024

## 2024-06-17 NOTE — Discharge Instructions (Signed)
 You were seen in the emerged ferment for reported medication withdrawal You did not need to be admitted to the hospital but it is important that you follow-up with your primary doctor to discuss your gabapentin  tapering As discussed with our psychiatry team we recommend you take hydroxyzine  at scheduled doses for the next several days to help with the anxiety as you are tapering off of gabapentin  Follow-up with the behavioral health team Return to the emergency department for severe anxiety thoughts of harming yourself or other concerns

## 2024-06-17 NOTE — Consult Note (Signed)
 [facility] Consult Note  Patient Name: Geoffrey West MRN: [id number] DOB: Aug 05, 1955 DATE OF Consult: 06/17/2024 Consult Order details:  Orders (From admission, onward)     Start     Ordered   06/17/24 1916  CONSULT TO CALL ACT TEAM       Ordering Provider: Pamella Ozell LABOR, DO  Provider:  (Not yet assigned)  Question:  Reason for Consult?  Answer:  SI   06/17/24 1915            PRIMARY PSYCHIATRIC DIAGNOSES  1.  Unspecified anxiety disorder   RECOMMENDATIONS  Recommendations: Medication recommendations: Recommend taking hydroxyzine  schedule for now 25mg  po BID for anxiety Non-Medication/therapeutic recommendations: Resources for outpatient therapy and psychiatry; crisis line information; ED return precautions   Is inpatient psychiatric hospitalization recommended for this patient? No (Explain why): Denies suicidal and homicidal ideation, future oriented, identifies reasons to live, social support From a psychiatric perspective, is this patient appropriate for discharge to an outpatient setting/resource or other less restrictive environment for continued care?  Yes (Explain why): As above Follow-Up Telepsychiatry C/L services: We will sign off for now. Please re-consult our service if needed for any concerning changes in the patient's condition, discharge planning, or questions. Communication: Treatment team members (and family members if applicable) who were involved in treatment/care discussions and planning, and with whom we spoke or engaged with via secure text/chat, include the following: Dr. Pamella by phone and team via Epic chat  Patient with significant anxiety coming off gabapentin , seen in ED twice and by primary care twice in the last 5 days. Denies suicidal ideation, intent, plan. Discuss with patient scheduled hydroxyzine  while he is tapering gabapentin . Patient is currently low risk for suicide due to significant protective factors including family, future  oriented, identifies reasons to live, history of managing stress without engaging in suicidal behavior. Therefore, inpatient psychiatric hospitalization is not recommended. Recommendations as above.   I personally spent a total of 55 minutes in the care of the patient today including preparing to see the patient, getting/reviewing separately obtained history, performing a medically appropriate exam/evaluation, counseling and educating, referring and communicating with other health care professionals, documenting clinical information in the EHR, communicating results, and coordinating care.  Thank you for involving us  in the care of this patient. If you have any additional questions or concerns, please call [phone] and ask for me or the provider on-call.  TELEPSYCHIATRY ATTESTATION & CONSENT  As the provider for this telehealth consult, I attest that I verified the patients identity using two separate identifiers, introduced myself to the patient, provided my credentials, disclosed my location, and performed this encounter via a HIPAA-compliant, real-time, face-to-face, two-way, interactive audio and video platform and with the full consent and agreement of the patient (or guardian as applicable.)  Patient physical location: ED in [facility] provider physical location: home office in state of [place]    Video start time: 2102 EST Video end time: 2132 EST   IDENTIFYING DATA  Geoffrey West is a 68 y.o. year-old male for whom a psychiatric consultation has been ordered by the primary provider. The patient was identified using two separate identifiers.  CHIEF COMPLAINT/REASON FOR CONSULT  Suicidal ideation   HISTORY OF PRESENT ILLNESS (HPI)  Geoffrey West is a 68 year old male with a history of anxiety, hypertension, radiculopathy who presents to the ED endorsing suicidal ideation in the setting of withdrawal from gabapentin . Chart reviewed. Psychiatry consulted for  evaluation and management.   Per  chart review, patient seen in the ED 12/21 for anxiety and concern for gabapentin  withdrawal. Patient was discharged with prescription for hydroxyzine  25mg  q8h as needed for anxiety.   Per primary care note 06/16/24: Withdrawal symptoms from gabapentin  tapering include anxiety, cold sweats, and balance issues. Gabapentin  initially prescribed for pain management caused side effects. Sensitive to medications and concerned about dependency. - Prescribed 100 mg gabapentin  capsules for tapering. - Instructed as follows: Take 300 mg in am and pm x 5 days, then  200 mg in am and 300 mg in pm x 5 days, then  200 mg in am and pm x 5 days, then  100 mg in am and 200 mg in pm x 5 days,  then 100 mg in am and pm x 5 days, then  only take 100 mg in pm x 5 days, then  off.  Per primary care physician note from 12/18.25:  Radiculopathy, lumbosacral region  Patient currently on gabapentin  though he is in the process of weaning off due to concern over potential side effects.  We did discuss strategy for weaning down.  He is currently on gabapentin  300 mg in the morning and 300 mg at night.  Did have some mild withdrawal effects yesterday.  Recommend he continue with his current regimen for the next few days and then decrease to 300 mg nightly for a few days and then 300 mg nightly every other day and then off.  He is aware of potential side effects.  He if he experiences significant withdrawal symptoms he can revert to the previous dosage and restart the cycle.   On evaluation, patient noted to be anxious, perseverative, circumstantial, not appearing internally preoccupied, not responding to internal stimuli, alert and oriented x 3. Patient reports he has had 28 orthopedic surgeries. He states he has been on gabapentin  prescribed by his neurosurgeon. He reports he started to gain weight, his ankles were swollen and he had trouble with his balance. He states he wanted to get off  of gabapentin  and saw his primary on Thursday and was given a taper schedule.  He reports before the taper he was taking 600mg , 300mg , 600mg . He states he was told to decrease the dose to 300mg , 300mg , 300mg . He states he feels like he came off of it too quickly. He is currently taking 300mg  in the morning and 300mg  in the evening since Saturday. He reports that he has periods of feeling more anxious and cold. He reports he was taking clonazepam  as needed for anxiety, states he was taking 0-1/2 a pill per day as needed for anxiety and insomnia for a short time and states he used it infrequently. He was given hydroxyzine  in the ED for anxiety since he had stopped the clonazepam  and has taken hydroxyzine  several times, unsure if it has happen. Patient reports when he feels unwell due to gabapentin  withdrawal he states, I do sometimes wish this feeling would go away. He states, I want to figure out what is causing this and how to make it go away. He denies passive and active suicidal suicidal ideation, intent, plan. Endorses access to firearms. He states, I would never hurt myself. Reports he has a lot to live for including his family. Patient states he feels depressed when he thinks about the gabapentin  and coming off of it. Denies other depressive symptoms. Denies symptoms consistent with mania/hypomania, paranoia, auditory and visual hallucinations, homicidal ideation.   Per niece, patient gets panicky about having gabapentin  in his  system at this point. She tried to get him a behavioral health appointment but was told he needed medical clearance first.    PAST PSYCHIATRIC HISTORY  Inpt admissions: Denies  Outpt treatment:  Primary care provider Prior medication trials:  Klonopin , hydroxyzine  Suicide attempts:  Denies Violence: Denies History of Trauma, abuse, neglect, and exploitation: Denies  C-SSRS 1) In the past month have you wished you were dead or wished you could go to sleep and not wake  up? []  Yes [x]   No 2) In the past month have you actually had any thoughts of killing yourself? []   Yes  [x]   No If YES to 2, ask questions 3, 4, 5, and 6. If NO to 2, go directly to question 6 3) In the past month have you been thinking about how you might do this? []   Yes  [x]   No 4) In the past month have you had these thoughts and had some intention of acting on them?  []   Yes []   No 5) In the past month have you started to work out or worked out the details of how to kill yourself? Do you intend to carry out this plan? []   Yes []   No 6) Have you ever done anything, started to do anything, or prepared to do anything to end your life? []   Yes [x]   No Otherwise as per HPI above.  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    diphenhydramine -acetaminophen  (TYLENOL  PM) 25-500 MG TABS tablet Take 1 tablet by mouth at bedtime as needed (sleep).   ARMOUR THYROID  60 MG tablet 1 tablet on an empty stomach Orally Once a day for 90 days    NEEDLE, DISP, 18 G (B-D HYPODERMIC NEEDLE 18GX1.5) 18G X 1-1/2 MISC Hypodermic Needles 18 gauge x 1 1/2  USE AS DIRECTED WITH TESTOSTERONE    amLODipine  (NORVASC ) 10 MG tablet Take 1 tablet (10 mg total) by mouth at bedtime.   pantoprazole  (PROTONIX ) 40 MG tablet TAKE 1 TABLET BY MOUTH EVERY MORNING   propranolol  ER (INDERAL  LA) 80 MG 24 hr capsule TAKE ONE CAPSULE BY MOUTH EVERY EVENING   celecoxib  (CELEBREX ) 200 MG capsule TAKE 1 CAPSULE BY MOUTH 2 TIMES A DAY   fluticasone  (FLONASE ) 50 MCG/ACT nasal spray Place 2 sprays into both nostrils daily.   clonazePAM  (KLONOPIN ) 0.5 MG tablet Take 1 tablet (0.5 mg total) by mouth 3 (three) times daily  as needed for anxiety.   hydrOXYzine  (ATARAX ) 25 MG tablet Take 1 tablet (25 mg total) by mouth every 8 (eight) hours as needed for up to 30 doses for anxiety.   gabapentin  (NEURONTIN ) 100 MG capsule Take 300 mg in am and pm x 5 days, then 200 mg in am and 300 mg in pm x 5 days, then 200 mg in am and pm x 5 days, then 100 mg in am and 200 mg in pm x 5 days, then 100 mg in am and pm x 5 days, then only take 100 mg in pm x 5 days, then off.     ALLERGIES  Allergies[1]  SOCIAL & SUBSTANCE USE HISTORY  Social History   Socioeconomic History   Marital status: Single    Spouse name: Not on file   Number of children: Not on file   Years of education: Not on file   Highest education level: Master's degree (e.g., MA, MS, MEng, MEd, MSW, MBA)  Occupational History   Not on file  Tobacco Use   Smoking status: Never   Smokeless tobacco: Never  Vaping Use   Vaping status: Never Used  Substance and Sexual Activity   Alcohol  use: Not Currently   Drug use: Never   Sexual activity: Not Currently  Other Topics Concern   Not on file  Social History Narrative   Not on file   Social Drivers of Health   Tobacco Use: Low Risk (06/17/2024)   Patient History    Smoking Tobacco Use: Never    Smokeless Tobacco Use: Never    Passive Exposure: Not on file   Financial Resource Strain: Low Risk (06/03/2024)   Overall Financial Resource Strain (CARDIA)    Difficulty of Paying Living Expenses: Not hard at all  Food Insecurity: No Food Insecurity (06/03/2024)   Epic    Worried About Radiation Protection Practitioner of Food in the Last Year: Never true    Ran Out of Food in the Last Year: Never true  Transportation Needs: No Transportation Needs (06/03/2024)   Epic    Lack of Transportation (Medical): No    Lack of Transportation (Non-Medical): No  Physical Activity: Sufficiently Active (06/03/2024)   Exercise Vital Sign    Days of Exercise per Week: 4 days    Minutes of Exercise per Session: 130 min  Stress: Stress Concern Present (06/03/2024)   [employer] of Occupational Health - Occupational Stress Questionnaire    Feeling of Stress: To some extent  Social Connections: Moderately Integrated (06/03/2024)   Social Connection and Isolation Panel    Frequency of Communication with Friends and Family: More than three times a week    Frequency of Social Gatherings with Friends and Family: Twice a week    Attends Religious Services: More than 4 times per year    Active Member of Clubs or Organizations: Yes    Attends Banker Meetings: More than 4 times per year    Marital Status: Never married  Depression (PHQ2-9): Low Risk (06/16/2024)   Depression (PHQ2-9)    PHQ-2 Score: 4  Recent Concern: Depression (PHQ2-9) - Medium Risk (06/12/2024)   Depression (PHQ2-9)    PHQ-2 Score: 6  Alcohol  Screen: Low Risk (10/09/2023)   Alcohol  Screen    Last Alcohol  Screening Score (AUDIT): 0  Housing: Low Risk (06/03/2024)   Epic    Unable to Pay for Housing in the Last Year: No    Number of Times Moved in the Last Year: 0  Homeless in the Last Year: No  Utilities: Not At Risk (10/09/2023)   AHC Utilities    Threatened with loss of utilities: No  Health Literacy: Adequate Health Literacy (10/09/2023)   B1300 Health Literacy    Frequency of need for help with  medical instructions: Never   Tobacco Use History[2] Social History   Substance and Sexual Activity  Alcohol  Use Not Currently   Social History   Substance and Sexual Activity  Drug Use Never      FAMILY HISTORY  Family History  Problem Relation Age of Onset   Arthritis Mother    Arthritis Sister    Alzheimer's disease Brother    Arthritis Brother      MENTAL GARMENT/TEXTILE TECHNOLOGIST (MSE)  Mental Status Exam: General Appearance: Casual  Orientation:  Full (Time, Place, and Person)  Memory:  Immediate;   Good Recent;   Good  Concentration:  Concentration: Fair  Recall:  Good  Attention  Fair  Eye Contact:  Good  Speech:  Clear and Coherent  Language:  Good  Volume:  Normal  Mood: Anxious   Affect:  Congruent  Thought Process:  Coherent  Thought Content:  Abstract Reasoning and Computation  Suicidal Thoughts:  No  Homicidal Thoughts:  No  Judgement:  Fair  Insight:  Fair  Psychomotor Activity:  Normal  Akathisia:  NA  Fund of Knowledge:  Good    Assets:  Communication Skills Desire for Improvement Housing Social Support  Cognition:  WNL  ADL's:  Intact  AIMS (if indicated):       VITALS  Blood pressure (!) 159/104, pulse 79, temperature 98.4 F (36.9 C), resp. rate 17, SpO2 98%.  LABS  Admission on 06/17/2024  Component Date Value Ref Range Status   Sodium 06/17/2024 138  135 - 145 mmol/L Final   Potassium 06/17/2024 3.8  3.5 - 5.1 mmol/L Final   Chloride 06/17/2024 102  98 - 111 mmol/L Final   CO2 06/17/2024 24  22 - 32 mmol/L Final   Glucose, Bld 06/17/2024 121 (H)  70 - 99 mg/dL Final   Glucose reference range applies only to samples taken after fasting for at least 8 hours.   BUN 06/17/2024 14  8 - 23 mg/dL Final   Creatinine, Ser 06/17/2024 1.52 (H)  0.61 - 1.24 mg/dL Final   Calcium  06/17/2024 10.1  8.9 - 10.3 mg/dL Final   Total Protein 87/76/7974 6.6  6.5 - 8.1 g/dL Final   Albumin  06/17/2024 4.0  3.5 - 5.0 g/dL Final   AST 87/76/7974 21  15 - 41  U/L Final   HEMOLYSIS AT THIS LEVEL MAY AFFECT RESULT   ALT 06/17/2024 17  0 - 44 U/L Final   Alkaline Phosphatase 06/17/2024 75  38 - 126 U/L Final   Total Bilirubin 06/17/2024 0.7  0.0 - 1.2 mg/dL Final   GFR, Estimated 06/17/2024 50 (L)  >60 mL/min Final   Comment: (NOTE) Calculated using the CKD-EPI Creatinine Equation (2021)    Anion gap 06/17/2024 13  5 - 15 Final   Performed at [facility], [address], [place], [place] [address]   Alcohol , Ethyl (B) 06/17/2024 <15  <15 mg/dL Final   Comment: (NOTE) For medical purposes only. Performed at [facility], [address], [place], [place] [address]    WBC 06/17/2024 6.8  4.0 - 10.5 K/uL Final   RBC 06/17/2024 5.53  4.22 - 5.81 MIL/uL Final   Hemoglobin 06/17/2024 17.2 (H)  13.0 -  17.0 g/dL Final   HCT 87/76/7974 50.1  39.0 - 52.0 % Final   MCV 06/17/2024 90.6  80.0 - 100.0 fL Final   MCH 06/17/2024 31.1  26.0 - 34.0 pg Final   MCHC 06/17/2024 34.3  30.0 - 36.0 g/dL Final   RDW 87/76/7974 13.0  11.5 - 15.5 % Final   Platelets 06/17/2024 156  150 - 400 K/uL Final   nRBC 06/17/2024 0.0  0.0 - 0.2 % Final   Performed at [facility], [address] [place], [place] [address]   Opiates 06/17/2024 NEGATIVE  NEGATIVE Final   Cocaine 06/17/2024 NEGATIVE  NEGATIVE Final   Benzodiazepines 06/17/2024 NEGATIVE  NEGATIVE Final   Amphetamines 06/17/2024 NEGATIVE  NEGATIVE Final   Tetrahydrocannabinol 06/17/2024 NEGATIVE  NEGATIVE Final   Barbiturates 06/17/2024 NEGATIVE  NEGATIVE Final   Methadone Scn, Ur 06/17/2024 NEGATIVE  NEGATIVE Final   Fentanyl  06/17/2024 NEGATIVE  NEGATIVE Final   Comment: (NOTE) Drug screen is for Medical Purposes only. Positive results are preliminary only. If confirmation is needed, notify lab within 5 days.  Drug Class                 Cutoff (ng/mL) Amphetamine and metabolites 1000 Barbiturate and metabolites 200 Benzodiazepine               200 Opiates and metabolites     300 Cocaine and metabolites     300 THC                         50 Fentanyl                     5 Methadone                   300  Trazodone is metabolized in vivo to several metabolites,  including pharmacologically active m-CPP, which is excreted in the  urine.  Immunoassay screens for amphetamines and MDMA have potential  cross-reactivity with these compounds and may provide false positive  result.  Performed at [facility], [address] [place], [place] [address]     PSYCHIATRIC REVIEW OF SYSTEMS (ROS)  ROS: Notable for the following relevant positive findings: Review of Systems  Psychiatric/Behavioral:  The patient is nervous/anxious.     Additional findings:      Musculoskeletal: No abnormal movements observed      Gait & Station: Laying/Sitting      Pain Screening: Denies      Nutrition & Dental Concerns: n/a  RISK FORMULATION/ASSESSMENT  Is the patient experiencing any suicidal or homicidal ideations: No      Protective factors considered for safety management: Family, future oriented, identifies reasons to live, history of managing stress without engaging in suicidal behavior   Risk factors/concerns considered for safety management:  Physical illness/chronic pain Age over 4 Male gender  Is there a safety management plan with the patient and treatment team to minimize risk factors and promote protective factors: Yes           Explain: Medication management; follow up with primary care; resources for outpatient therapy and psychiatry; crisis line information; ED return precautions  Is crisis care placement or psychiatric hospitalization recommended: No     Based on my current evaluation and risk assessment, patient is determined at this time to be at:  Low risk  *RISK ASSESSMENT Risk assessment is a dynamic process; it is possible that this patient's condition, and  risk level, may change. This should be  re-evaluated and managed over time as appropriate. Please re-consult psychiatric consult services if additional assistance is needed in terms of risk assessment and management. If your team decides to discharge this patient, please advise the patient how to best access emergency psychiatric services, or to call 911, if their condition worsens or they feel unsafe in any way.   Erla JAYSON Rase, MD Telepsychiatry Consult Services    [1]  Allergies Allergen Reactions   Fentanyl  Shortness Of Breath and Other (See Comments)    Reaction to [facility] ONLY > ? DOSE REGULATION ?    Gabapentin  Other (See Comments)   Pregabalin Other (See Comments)   Oxycodone Other (See Comments)    Pt states he goes through hot and cold flashes withdrawal like symptoms  [2]  Social History Tobacco Use  Smoking Status Never  Smokeless Tobacco Never

## 2024-06-17 NOTE — BH Assessment (Signed)
 Clinician spoke with [facility] to complete pt's TTS assessment. Clinician provided pt's name, MRN, location, age, room number and provider's name. Secure message completed.    [facility] coordinator to update secure chat when assessment time and provider are assigned.  Jackson JONETTA Broach, MS, [facility], [facility] Triage Specialist [phone]

## 2024-06-18 ENCOUNTER — Encounter: Payer: Self-pay | Admitting: Physician Assistant

## 2024-06-18 ENCOUNTER — Ambulatory Visit: Payer: Self-pay

## 2024-06-18 ENCOUNTER — Ambulatory Visit: Admitting: Physician Assistant

## 2024-06-18 VITALS — BP 124/86 | HR 91 | Temp 98.1°F | Ht 71.0 in | Wt 221.2 lb

## 2024-06-18 DIAGNOSIS — F419 Anxiety disorder, unspecified: Secondary | ICD-10-CM

## 2024-06-18 DIAGNOSIS — R600 Localized edema: Secondary | ICD-10-CM | POA: Diagnosis not present

## 2024-06-18 DIAGNOSIS — T887XXA Unspecified adverse effect of drug or medicament, initial encounter: Secondary | ICD-10-CM

## 2024-06-18 MED ORDER — FUROSEMIDE 20 MG PO TABS: 20.0000 mg | ORAL_TABLET | Freq: Every day | ORAL | 0 refills | Status: DC | PRN

## 2024-06-18 NOTE — Telephone Encounter (Signed)
 FYI Only or Action Required?: FYI only for provider: appointment scheduled on 06/18/24.  Patient was last seen in primary care on 06/16/2024 by [name], PA.  Called Nurse Triage reporting Medication Problem.  Symptoms began while tapering dose of gabapentin .  Interventions attempted: Prescription medications: hydroxyzine .  Symptoms are: gradually worsening.  Triage Disposition: Call PCP Now  Patient/caregiver understands and will follow disposition?:    Reason for Disposition  [1] Caller has URGENT medicine question about med that primary care doctor (or NP/PA) or specialist prescribed AND [2] triager unable to answer question  Answer Assessment - Initial Assessment Questions Pt reports chills, depression and anxiety as he tapers off of gabapentin . States ED would not advise on tapering recommendation. Is taking gabapentin  300 mg in the AM and 300 mg in the PM. Is also taking hydroxyzine  25 mg TID instead of PRN per [facility] at the ED.   States clonidine helped him significantly when he withdrew from OxyContin after his surgery.  This RN provided pt with 24 hr BH hotline and suicide hotline as well as ED/911. Scheduled same day OV with provider at PCP office.  Note: Pt seen in ED on 06/17/24 for withdrawal symptoms while tapering off of Gabapentin . ED note states pt did not req admission or benzodiazepine admin and pt was discharged with [facility] resources and was instructed to continue hydroxyzine  and f/u with PCP. No OV available until Dec 30.  1. NAME of MEDICINE: What medicine(s) are you calling about?     Gabapentin  tapering 2. QUESTION: What is your question? (e.g., double dose of medicine, side effect)     See above 3. PRESCRIBER: Who prescribed the medicine? Reason: if prescribed by specialist, call should be referred to that group.     PCP 4. SYMPTOMS: Do you have any symptoms? If Yes, ask: What symptoms are you having?  How bad are the symptoms (e.g., mild,  moderate, severe)     Chills, anxiety  Protocols used: Medication Question Call-A-AH  Message from Mia F sent at 06/18/2024  8:27 AM EST  Reason for Triage: Pt says he has been going through withdraws from being on Gabapentin . He has been dealing with hot and cold flashes, anxiety and feeling like he was coming out of his skin. He says he went to the ER last night and was told he need to taper off the medication but the ER doctor says he does not want to treat him for this so he suggests pt to see his pcp. Pt says he is not having any symptoms right now but would like to know what to do if this comes up again as he does not want to ruin the holiday for himself or his family. He want to know how to take the Gabapentin  so he can taper off the meds and not have the symptoms he has been experiencing. [phone]

## 2024-06-18 NOTE — Progress Notes (Signed)
 "   Patient ID: Geoffrey West, male    DOB: 11/12/1955, 68 y.o.   MRN: [id number]   Assessment & Plan:  Medication side effect  Anxiety  Bilateral leg edema  Other orders -     Furosemide ; Take 1 tablet (20 mg total) by mouth daily as needed for fluid or edema.  Dispense: 20 tablet; Refill: 0     Assessment & Plan Gabapentin  withdrawal syndrome Experiencing severe anxiety and withdrawal symptoms after attempting to taper off gabapentin . Previously on 600 mg daily, reduced to 300 mg daily, leading to withdrawal symptoms. ER visits for severe withdrawal, managed with hydroxyzine . No acute withdrawal requiring medical admission. Desires to discontinue gabapentin  due to side effects including weight gain, swelling, and coordination issues. Hydroxyzine  prescribed for anxiety management during tapering. Behavioral health advised taking hydroxyzine  regularly during tapering. Support system in place with family nearby. - Continue hydroxyzine  three times daily during gabapentin  taper. - Maintain gabapentin  at 300 mg three times daily until further evaluation. - Monitor for withdrawal symptoms and adjust gabapentin  dosage as needed. - Scheduled follow-up with Doctor Kennyth or Doctor Jesus for further management.  Peripheral edema Reports swelling in ankles, possibly related to gabapentin  use. No signs of blood clots. Edema may be exacerbated by withdrawal symptoms. Compression stockings not well tolerated due to discomfort. - Prescribed Lasix  as needed for edema management. - Advised adequate hydration while taking Lasix . - Recommended leg elevation above heart level when at home. - Try compression stockings  Recommend [facility] if any sudden SI or HI. Pt to keep us  updated how he is doing.    F/up on Monday at our office    Subjective:    Chief Complaint  Patient presents with   Follow-up    Pt in office for ED follow up; pt has been seen several times in ED recently 06/17/24 c/o  Gabapentin  withdrawals; pt had meds adjusted at in office appt Monday, and pt said adjustments not working for him.     HPI Discussed the use of AI scribe software for clinical note transcription with the patient, who gave verbal consent to proceed.  History of Present Illness Geoffrey West is a 68 year old male who presents with withdrawal symptoms while tapering off gabapentin .  He has been tapering off gabapentin  due to concerns about dependence and side effects, including peripheral edema, weight gain, myoclonus, nocturnal imbalance, and a sensation of intoxication. Initially on 600 mg daily, divided into 300 mg in the morning and 300 mg at night, he began tapering to 300 mg once daily over four days, which led to severe anxiety and withdrawal symptoms.  He has experienced significant withdrawal symptoms, including severe anxiety, emotional instability, and crying. He has had two ER visits, one on December 21 and another just yesterday, where he underwent a psychiatric evaluation. He has a history of back issues for which gabapentin  was prescribed. He has been on gabapentin  since at least September, with a previous trial in 2019 or 2020 that was discontinued due to suicidal thoughts. The withdrawal symptoms, including anxiety and emotional distress, began when he started tapering off the medication.  He has tried clonazepam  for withdrawal symptoms without benefit and was given hydroxyzine  in the ER to manage anxiety. He is currently taking 300 mg of gabapentin  in the morning and at night. He has a history of withdrawal symptoms from pain medications following a knee replacement, which were managed with clonidine. He is concerned about the interaction  of gabapentin  with other medications he has taken, including naltrexone and tramadol .  No alcohol  use in the past year. He is concerned about the impact of withdrawal symptoms on his daily activities and upcoming holiday  plans.     Past Medical History:  Diagnosis Date   Allergy    Anxiety    Arthritis    all over (03/06/2018)   Close exposure to COVID-19 virus 07/08/2019   Encounter for general adult medical examination without abnormal findings 07/05/2013   Encounter for orthopedic follow-up care 12/28/2017   GERD (gastroesophageal reflux disease)    Gout    on daily RX (03/06/2018)   History of kidney stones     passed x 1   Hypertension    Hypertension 12/25/2016   Loosening of total shoulder replacement    left   Low testosterone  12/25/2016   Migraine    haven't had more than 1/year since propanolol started (03/06/2018)   Peripherally inserted central venous catheter in situ 08/07/2018   Propionibacterium infection 08/28/2019   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   Rash 09/17/2019   Situational depression    recently lost my mom and brother (03/06/2018)   Wears contact lenses    Wears glasses     Past Surgical History:  Procedure Laterality Date   BACK SURGERY     COLONOSCOPY     COLONOSCOPY W/ BIOPSIES AND POLYPECTOMY     EXCISIONAL TOTAL SHOULDER ARTHROPLASTY WITH ANTIBIOTIC SPACER Left 03/06/2018   Procedure: Left shoulder antibiotic spacer expalnt with glenoid bone grafting, antiobiotic spacer placement;  Surgeon: Sharl Selinda Dover, MD;  Location: [facility] OR;  Service: Orthopedics;  Laterality: Left;   EXCISIONAL TOTAL SHOULDER ARTHROPLASTY WITH ANTIBIOTIC SPACER Left 01/06/2020   Procedure: LEFT SHOULDER ARTHROPLASTY EXPLANT WITH ANTIBIOTIC SPACER PLACEMENT;  Surgeon: Sharl Selinda Dover, MD;  Location: [facility] OR;  Service: Orthopedics;  Laterality: Left;  2.5 hrs RNFA   EYE SURGERY     IR FLUORO GUIDE CV LINE RIGHT  12/26/2016   IRRIGATION AND DEBRIDEMENT SHOULDER Left 05/27/2020   Procedure: Left shoulder antibiotic spacer exchange;  Surgeon: Sharl Selinda Dover, MD;  Location: [facility] OR;  Service: Orthopedics;  Laterality: Left;  120 mins   JOINT REPLACEMENT      KNEE ARTHROSCOPY Bilateral    X 10 total arthroscopies   KNEE CARTILAGE SURGERY Left 1977   cartilage removed   LASIK Bilateral    LUMBAR DISC SURGERY  2000s   ruptured disc   REVERSE SHOULDER ARTHROPLASTY Left 12/08/2016   Procedure: Revision left total shoulder to reverse total shoulder arthroplasty;  Surgeon: Sharl Selinda Dover, MD;  Location: [facility] OR;  Service: Orthopedics;  Laterality: Left;   REVERSE SHOULDER ARTHROPLASTY Left 01/01/2018   Procedure: Left reverse shouler explant with antibiotic spacer placement;  Surgeon: Sharl Selinda Dover, MD;  Location: [facility] ORS;  Service: Orthopedics;  Laterality: Left;  2.5 hrs   REVERSE SHOULDER ARTHROPLASTY Left 06/11/2018   Procedure: REVERSE TOTAL SHOULDER REVISION;  Surgeon: Sharl Selinda Dover, MD;  Location: [facility] OR;  Service: Orthopedics;  Laterality: Left;   REVERSE SHOULDER ARTHROPLASTY Left 12/05/2018   Procedure: Left reverse shoulder explant, bone grafting;  Surgeon: Sharl Selinda Dover, MD;  Location: [facility] OR;  Service: Orthopedics;  Laterality: Left;  2.5 hrs   REVISION OF TOTAL SHOULDER Left 12/26/2018   REVISION TOTAL SHOULDER TO REVERSE TOTAL SHOULDER Left 07/16/2018   Procedure: Left shoulder irrigation and debridement with poly and head ball exchange;  Surgeon: Sharl Selinda Dover, MD;  Location: [facility] OR;  Service: Orthopedics;  Laterality: Left;  2 hrs   SHOULDER ARTHROSCOPY Right 1980s/1990s   bone spur removed   SHOULDER OPEN ROTATOR CUFF REPAIR Right 1970s   SHOULDER SURGERY Left 03/06/2018   antibiotic spacer expalnt with glenoid bone grafting, antiobiotic spacer placement   SPINE SURGERY     TOTAL KNEE ARTHROPLASTY Bilateral    TOTAL SHOULDER ARTHROPLASTY Left ~ 2014-2017 X 2   in [place]   TOTAL SHOULDER REPLACEMENT Right ~ 2009   in [place]   TOTAL SHOULDER REVISION Left 12/26/2018   Procedure: Left prosthetic shoulder 1 component revision;  Surgeon: Sharl Selinda Dover, MD;  Location: [facility] OR;   Service: Orthopedics;  Laterality: Left;  90 mins   WISDOM TOOTH EXTRACTION      Family History  Problem Relation Age of Onset   Arthritis Mother    Arthritis Sister    Alzheimer's disease Brother    Arthritis Brother     Social History[1]   Allergies[2]  Review of Systems NEGATIVE UNLESS OTHERWISE INDICATED IN HPI      Objective:     BP 124/86 (BP Location: Left Arm, Patient Position: Sitting, Cuff Size: Large)   Pulse 91   Temp 98.1 F (36.7 C) (Temporal)   Ht 5' 11 (1.803 m)   Wt 221 lb 3.2 oz (100.3 kg)   SpO2 99%   BMI 30.85 kg/m   Wt Readings from Last 3 Encounters:  06/18/24 221 lb 3.2 oz (100.3 kg)  06/16/24 222 lb 12.8 oz (101.1 kg)  06/15/24 217 lb (98.4 kg)    BP Readings from Last 3 Encounters:  06/18/24 124/86  06/17/24 (!) 153/117  06/16/24 (!) 152/70     Physical Exam Vitals and nursing note reviewed.  Constitutional:      Appearance: Normal appearance.  Eyes:     Extraocular Movements: Extraocular movements intact.     Conjunctiva/sclera: Conjunctivae normal.     Pupils: Pupils are equal, round, and reactive to light.  Cardiovascular:     Rate and Rhythm: Normal rate and regular rhythm.     Pulses: Normal pulses.     Heart sounds: Normal heart sounds.  Pulmonary:     Effort: Pulmonary effort is normal.     Breath sounds: Normal breath sounds.  Musculoskeletal:     Right lower leg: Edema (1+) present.     Left lower leg: Edema (1+) present.  Skin:    Findings: No lesion or rash.  Neurological:     General: No focal deficit present.     Mental Status: He is alert and oriented to person, place, and time.  Psychiatric:        Mood and Affect: Mood is anxious. Affect is tearful.             Sariya Trickey M Eddi Hymes, PA-C     [1]  Social History Tobacco Use   Smoking status: Never   Smokeless tobacco: Never  Vaping Use   Vaping status: Never Used  Substance Use Topics   Alcohol  use: Not Currently   Drug use: Never  [2]   Allergies Allergen Reactions   Fentanyl  Shortness Of Breath and Other (See Comments)    Reaction to [facility] ONLY > ? DOSE REGULATION ?    Gabapentin  Other (See Comments)   Pregabalin Other (See Comments)   Oxycodone Other (See Comments)    Pt states he goes through hot and cold flashes withdrawal like symptoms   "

## 2024-06-18 NOTE — Patient Instructions (Signed)
 F/up Monday with Dr. Jesus, place on cancellation list for Dr. Kennyth for Monday   VISIT SUMMARY: You visited us  today due to severe anxiety and withdrawal symptoms while tapering off gabapentin . We discussed your current medication regimen and made adjustments to help manage your symptoms and address your concerns about side effects.  YOUR PLAN: GABAPENTIN  WITHDRAWAL SYNDROME: You are experiencing severe anxiety and withdrawal symptoms after reducing your gabapentin  dosage. You have a history of back issues for which gabapentin  was prescribed. -Continue taking hydroxyzine  three times daily to manage anxiety during the gabapentin  taper. -Maintain your current gabapentin  dosage at 300 mg three times daily until further evaluation. -Monitor for withdrawal symptoms and contact us  if they worsen or if you have any concerns. -Follow up with Doctor Kennyth (PCP) or Doctor Jesus for further management.  PERIPHERAL EDEMA: You have swelling in your ankles, which may be related to gabapentin  use and withdrawal symptoms. -Take Lasix  once daily as needed to manage the swelling. -Ensure you stay adequately hydrated while taking Lasix . -Elevate your legs above heart level when you are at home to help reduce the swelling. -Try using compression stockings                      Contains text generated by [employer].                                 Contains text generated by [employer].

## 2024-06-18 NOTE — Telephone Encounter (Signed)
 Noted pt completed ED f/u with [facility] 06/18/24

## 2024-06-23 ENCOUNTER — Encounter: Payer: Self-pay | Admitting: Internal Medicine

## 2024-06-23 ENCOUNTER — Ambulatory Visit: Admitting: Internal Medicine

## 2024-06-23 VITALS — BP 136/78 | HR 77 | Temp 97.3°F | Ht 71.0 in | Wt 223.0 lb

## 2024-06-23 DIAGNOSIS — F419 Anxiety disorder, unspecified: Secondary | ICD-10-CM

## 2024-06-23 DIAGNOSIS — G894 Chronic pain syndrome: Secondary | ICD-10-CM | POA: Diagnosis not present

## 2024-06-23 DIAGNOSIS — F13939 Sedative, hypnotic or anxiolytic use, unspecified with withdrawal, unspecified: Secondary | ICD-10-CM

## 2024-06-23 DIAGNOSIS — R29818 Other symptoms and signs involving the nervous system: Secondary | ICD-10-CM | POA: Diagnosis not present

## 2024-06-23 DIAGNOSIS — R600 Localized edema: Secondary | ICD-10-CM

## 2024-06-23 DIAGNOSIS — I10 Essential (primary) hypertension: Secondary | ICD-10-CM | POA: Diagnosis not present

## 2024-06-23 MED ORDER — GUANFACINE HCL 1 MG PO TABS: 1.0000 mg | ORAL_TABLET | Freq: Every day | ORAL | 2 refills | Status: DC

## 2024-06-23 MED ORDER — BUSPIRONE HCL 5 MG PO TABS: 5.0000 mg | ORAL_TABLET | Freq: Two times a day (BID) | ORAL | 2 refills | Status: DC

## 2024-06-23 NOTE — Assessment & Plan Note (Signed)
 Chronic pain management is challenging due to multiple joint replacements and sensitivity to medications. Gabapentin  was ineffective for pain relief and contributed to withdrawal symptoms. Focus on tapering off gabapentin  as per the withdrawal management plan.

## 2024-06-23 NOTE — Progress Notes (Signed)
 ==============================  [facility]: [phone]   -- Medical Office Visit --  Patient: Geoffrey West      Age: 68 y.o.       Sex:  male  Date:   06/23/2024 Today's Healthcare Provider: Bernardino KANDICE Cone, MD  ==============================   Chief Complaint: Medication Problem (Still trying to get off gabapentin . Feels like it is going better. Still on gabapentin  3 times  a day. Needing a tamper. Was taking 5 a day, now down to 3 a day. )  Discussed the use of AI scribe software for clinical note transcription with the patient, who gave verbal consent to proceed. History of Present Illness 68 year old male with chronic pain who presents with difficulty tapering off gabapentin  due to withdrawal symptoms.  He takes gabapentin  300 mg three times daily. Attempts to reduce the dose have led to withdrawal symptoms, including anxiety and increased blood pressure, resulting in emergency room visits. He describes the withdrawal as 'awful' and prefers to endure pain rather than experience withdrawal symptoms.  He experiences side effects from gabapentin , such as weight gain, stumbling, dropping things, swollen ankles, and fluid retention. These symptoms began after starting gabapentin , and he has not noticed significant relief from his back pain while on the medication.  He is also taking hydroxyzine  three times a day to manage anxiety symptoms associated with withdrawal and wants to discontinue it due to drowsiness.  He has a history of 28 orthopedic surgeries, including knee and shoulder replacements, and has previously struggled with pain medication management, including a past issue with OxyContin following knee replacement surgery, which he successfully discontinued.  He is also on furosemide  for fluid retention but questions its effectiveness as he continues to experience swelling and discomfort, particularly in his ankles and feet.  He  occasionally uses clonazepam  for anxiety, particularly when experiencing symptoms such as waking up feeling unable to breathe and with elevated blood pressure, which occurs every few months. He is concerned about the potential need to taper off clonazepam  as well.  He is retired, previously worked as a runner, broadcasting/film/video, and is covered by a Museum/gallery Curator. He is interested in improving his overall health and weight management, considering the possibility of working with a control and instrumentation engineer.   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; History of artificial joint; Anxiety; Joint pain; Subclinical hypothyroidism; Seasonal allergies; Gastroesophageal reflux disease without esophagitis; Melanoma in situ (HCC); Dyslipidemia; Allergic rhinitis; Body mass index (BMI) 30.0-30.9, adult; Chronic migraine without aura, not intractable, without status migrainosus; Decreased muscle strength; Difficulty sleeping; Fatigue; High prostate specific antigen (PSA); History of 2019 novel coronavirus disease (COVID-19); Hx of gout; Insomnia; Long-term current use of testosterone  cypionate; Testicular hypofunction; Migraine with aura; Reactive depression (situational); Panic disorder; Mixed hyperlipidemia; Pain in joint of right shoulder; Polycythemia; Primary osteoarthritis of left shoulder; Unspecified osteoarthritis, unspecified site; Chronic left shoulder pain; Chronic pain syndrome; Hypertensive disorder; and Acquired hypothyroidism on their problem list. Past Medical History:  has a past medical history of Allergy, Anxiety, Arthritis, Close exposure to COVID-19 virus (07/08/2019), Encounter for general adult medical examination without abnormal findings (07/05/2013), Encounter for orthopedic follow-up care (12/28/2017), GERD (gastroesophageal reflux disease), Gout, History of  kidney stones, Hypertension, Hypertension (12/25/2016), Loosening of total shoulder replacement, Low testosterone  (12/25/2016), Migraine, Peripherally inserted central venous catheter in situ (08/07/2018), Propionibacterium infection (08/28/2019), Prosthetic shoulder infection (  12/25/2016), Rash (09/17/2019), Situational depression, Wears contact lenses, and Wears glasses. Past Surgical History:   has a past surgical history that includes Back surgery; Colonoscopy; Reverse shoulder arthroplasty (Left, 12/08/2016); IR Fluoro Guide CV Line Right (12/26/2016); LASIK (Bilateral); Reverse shoulder arthroplasty (Left, 01/01/2018); Shoulder surgery (Left, 03/06/2018); Knee arthroscopy (Bilateral); Knee cartilage surgery (Left, 1977); Total knee arthroplasty (Bilateral); Shoulder open rotator cuff repair (Right, 1970s); Shoulder arthroscopy (Right, 1980s/1990s); Total shoulder replacement (Right, ~ 2009); Total shoulder arthroplasty (Left, ~ 2014-2017 X 2); Lumbar disc surgery (2000s); Colonoscopy w/ biopsies and polypectomy; Excisional total shoulder arthroplasty with antibiotic spacer (Left, 03/06/2018); Wisdom tooth extraction; Reverse shoulder arthroplasty (Left, 06/11/2018); Revision total shoulder to reverse total shoulder (Left, 07/16/2018); Reverse shoulder arthroplasty (Left, 12/05/2018); Revision of total shoulder (Left, 12/26/2018); Total shoulder revision (Left, 12/26/2018); Excisional total shoulder arthroplasty with antibiotic spacer (Left, 01/06/2020); Irrigation and debridement shoulder (Left, 05/27/2020); Eye surgery; Joint replacement; and Spine surgery. Social History:   reports that he has never smoked. He has never used smokeless tobacco. He reports that he does not currently use alcohol . He reports that he does not use drugs. Family History:  family history includes Alzheimer's disease in his brother; Arthritis in his brother, mother, and sister. Allergies:  is allergic to fentanyl , gabapentin ,  pregabalin, and oxycodone.   Medication Reconciliation: Current Outpatient Medications on File Prior to Visit  Medication Sig   amLODipine  (NORVASC ) 10 MG tablet Take 1 tablet (10 mg total) by mouth at bedtime.   ARMOUR THYROID  60 MG tablet 1 tablet on an empty stomach Orally Once a day for 90 days   celecoxib  (CELEBREX ) 200 MG capsule TAKE 1 CAPSULE BY MOUTH 2 TIMES A DAY   clonazePAM  (KLONOPIN ) 0.5 MG tablet Take 1 tablet (0.5 mg total) by mouth 3 (three) times daily as needed for anxiety.   diphenhydramine -acetaminophen  (TYLENOL  PM) 25-500 MG TABS tablet Take 1 tablet by mouth at bedtime as needed (sleep).   fluticasone  (FLONASE ) 50 MCG/ACT nasal spray Place 2 sprays into both nostrils daily.   furosemide  (LASIX ) 20 MG tablet Take 1 tablet (20 mg total) by mouth daily as needed for fluid or edema.   gabapentin  (NEURONTIN ) 100 MG capsule Take 300 mg in am and pm x 5 days, then 200 mg in am and 300 mg in pm x 5 days, then 200 mg in am and pm x 5 days, then 100 mg in am and 200 mg in pm x 5 days, then 100 mg in am and pm x 5 days, then only take 100 mg in pm x 5 days, then off.   hydrOXYzine  (ATARAX ) 25 MG tablet Take 1 tablet (25 mg total) by mouth every 8 (eight) hours as needed for up to 30 doses for anxiety. (Patient taking differently: Take 25 mg by mouth 3 (three) times daily.)   NEEDLE, DISP, 18 G (B-D HYPODERMIC NEEDLE 18GX1.5) 18G X 1-1/2 MISC Hypodermic Needles 18 gauge x 1 1/2  USE AS DIRECTED WITH TESTOSTERONE    pantoprazole  (PROTONIX ) 40 MG tablet TAKE 1 TABLET BY MOUTH EVERY MORNING   propranolol  ER (INDERAL  LA) 80 MG 24 hr capsule TAKE ONE CAPSULE BY MOUTH EVERY EVENING   testosterone  cypionate (DEPOTESTOSTERONE CYPIONATE) 200 MG/ML injection Inject 140 mg into the muscle every Wednesday.    No current facility-administered medications on file prior to visit.  There are no discontinued medications.   Physical Exam:    06/23/2024   12:53 PM 06/18/2024   10:24 AM 06/17/2024    10:42 PM  Vitals with BMI  Height 5' 11 5' 11   Weight 223 lbs 221 lbs 3 oz   BMI 31.12 30.86   Systolic 136 124 846  Diastolic 78 86 117  Pulse 77 91 85  Vital signs reviewed.  Nursing notes reviewed. Weight trend reviewed. Physical Activity: Sufficiently Active (06/03/2024)   Exercise Vital Sign    Days of Exercise per Week: 4 days    Minutes of Exercise per Session: 130 min   General Appearance:  No acute distress appreciable.   Well-groomed, healthy-appearing male.  Well proportioned with no abnormal fat distribution.  Good muscle tone. Pulmonary:  Normal work of breathing at rest, no respiratory distress apparent. SpO2: 99 %  Musculoskeletal: All extremities are intact.  Neurological:  Awake, alert, oriented, and engaged.  No obvious focal neurological deficits or cognitive impairments.  Sensorium seems unclouded.   Speech is clear and coherent with logical content. Psychiatric:  Appropriate mood, pleasant and cooperative demeanor, thoughtful and engaged during the exam   Verbalized to patient: Physical Exam EXTREMITIES: Moderate to severe ankle edema.   Results:   Verbalized to patient: Results Labs Hgb: Elevated     06/16/2024    2:11 PM 06/12/2024    9:54 AM 06/06/2024    9:50 AM 05/30/2024    2:40 PM  PHQ 2/9 Scores  PHQ - 2 Score 2 2 1 3   PHQ- 9 Score 4 6 4 7     {   No results found for any visits on 06/23/24.} Admission on 06/17/2024, Discharged on 06/17/2024  Component Date Value Ref Range Status   Sodium 06/17/2024 138  135 - 145 mmol/L Final   Potassium 06/17/2024 3.8  3.5 - 5.1 mmol/L Final   Chloride 06/17/2024 102  98 - 111 mmol/L Final   CO2 06/17/2024 24  22 - 32 mmol/L Final   Glucose, Bld 06/17/2024 121 (H)  70 - 99 mg/dL Final   BUN 87/76/7974 14  8 - 23 mg/dL Final   Creatinine, Ser 06/17/2024 1.52 (H)  0.61 - 1.24 mg/dL Final   Calcium  06/17/2024 10.1  8.9 - 10.3 mg/dL Final   Total Protein 87/76/7974 6.6  6.5 - 8.1 g/dL Final    Albumin  06/17/2024 4.0  3.5 - 5.0 g/dL Final   AST 87/76/7974 21  15 - 41 U/L Final   ALT 06/17/2024 17  0 - 44 U/L Final   Alkaline Phosphatase 06/17/2024 75  38 - 126 U/L Final   Total Bilirubin 06/17/2024 0.7  0.0 - 1.2 mg/dL Final   GFR, Estimated 06/17/2024 50 (L)  >60 mL/min Final   Anion gap 06/17/2024 13  5 - 15 Final   Alcohol , Ethyl (B) 06/17/2024 <15  <15 mg/dL Final   WBC 87/76/7974 6.8  4.0 - 10.5 K/uL Final   RBC 06/17/2024 5.53  4.22 - 5.81 MIL/uL Final   Hemoglobin 06/17/2024 17.2 (H)  13.0 - 17.0 g/dL Final   HCT 87/76/7974 50.1  39.0 - 52.0 % Final   MCV 06/17/2024 90.6  80.0 - 100.0 fL Final   MCH 06/17/2024 31.1  26.0 - 34.0 pg Final   MCHC 06/17/2024 34.3  30.0 - 36.0 g/dL Final   RDW 87/76/7974 13.0  11.5 - 15.5 % Final   Platelets 06/17/2024 156  150 - 400 K/uL Final   nRBC 06/17/2024 0.0  0.0 - 0.2 % Final   Opiates 06/17/2024 NEGATIVE  NEGATIVE Final   Cocaine 06/17/2024 NEGATIVE  NEGATIVE Final   Benzodiazepines 06/17/2024 NEGATIVE  NEGATIVE Final   Amphetamines 06/17/2024 NEGATIVE  NEGATIVE Final   Tetrahydrocannabinol 06/17/2024 NEGATIVE  NEGATIVE Final   Barbiturates 06/17/2024 NEGATIVE  NEGATIVE Final   Methadone Scn, Ur 06/17/2024 NEGATIVE  NEGATIVE Final   Fentanyl  06/17/2024 NEGATIVE  NEGATIVE Final  Admission on 06/15/2024, Discharged on 06/15/2024  Component Date Value Ref Range Status   WBC 06/15/2024 6.4  4.0 - 10.5 K/uL Final   RBC 06/15/2024 5.39  4.22 - 5.81 MIL/uL Final   Hemoglobin 06/15/2024 17.2 (H)  13.0 - 17.0 g/dL Final   HCT 87/78/7974 48.0  39.0 - 52.0 % Final   MCV 06/15/2024 89.1  80.0 - 100.0 fL Final   MCH 06/15/2024 31.9  26.0 - 34.0 pg Final   MCHC 06/15/2024 35.8  30.0 - 36.0 g/dL Final   RDW 87/78/7974 13.1  11.5 - 15.5 % Final   Platelets 06/15/2024 155  150 - 400 K/uL Final   nRBC 06/15/2024 0.0  0.0 - 0.2 % Final   Neutrophils Relative % 06/15/2024 63  % Final   Neutro Abs 06/15/2024 4.0  1.7 - 7.7 K/uL Final    Lymphocytes Relative 06/15/2024 18  % Final   Lymphs Abs 06/15/2024 1.1  0.7 - 4.0 K/uL Final   Monocytes Relative 06/15/2024 12  % Final   Monocytes Absolute 06/15/2024 0.8  0.1 - 1.0 K/uL Final   Eosinophils Relative 06/15/2024 6  % Final   Eosinophils Absolute 06/15/2024 0.4  0.0 - 0.5 K/uL Final   Basophils Relative 06/15/2024 1  % Final   Basophils Absolute 06/15/2024 0.1  0.0 - 0.1 K/uL Final   Immature Granulocytes 06/15/2024 0  % Final   Abs Immature Granulocytes 06/15/2024 0.01  0.00 - 0.07 K/uL Final   Sodium 06/15/2024 139  135 - 145 mmol/L Final   Potassium 06/15/2024 3.6  3.5 - 5.1 mmol/L Final   Chloride 06/15/2024 101  98 - 111 mmol/L Final   CO2 06/15/2024 26  22 - 32 mmol/L Final   Glucose, Bld 06/15/2024 130 (H)  70 - 99 mg/dL Final   BUN 87/78/7974 15  8 - 23 mg/dL Final   Creatinine, Ser 06/15/2024 1.43 (H)  0.61 - 1.24 mg/dL Final   Calcium  06/15/2024 10.6 (H)  8.9 - 10.3 mg/dL Final   Total Protein 87/78/7974 6.9  6.5 - 8.1 g/dL Final   Albumin  06/15/2024 4.2  3.5 - 5.0 g/dL Final   AST 87/78/7974 21  15 - 41 U/L Final   ALT 06/15/2024 20  0 - 44 U/L Final   Alkaline Phosphatase 06/15/2024 75  38 - 126 U/L Final   Total Bilirubin 06/15/2024 0.8  0.0 - 1.2 mg/dL Final   GFR, Estimated 06/15/2024 54 (L)  >60 mL/min Final   Anion gap 06/15/2024 11  5 - 15 Final  Abstract on 04/28/2024  Component Date Value Ref Range Status   Hemoglobin 02/19/2024 17.6 (A)  13.5 - 17.5 Final   HCT 02/19/2024 52  41 - 53 Final   Neutrophils Absolute 02/19/2024 4.90   Final   Platelets 02/19/2024 237  150 - 400 K/uL Final   WBC 02/19/2024 7.4   Final   RBC 02/19/2024 5.28 (A)  3.87 - 5.11 Final   Glucose 02/19/2024 2   Final   BUN 02/19/2024 20  4 - 21 Final   CO2 02/19/2024 25 (A)  13 - 22 Final   Creatinine 02/19/2024 1.5 (A)  0.6 - 1.3 Final   Potassium 02/19/2024 4.5  3.5 -  5.1 mEq/L Final   Sodium 02/19/2024 141  137 - 147 Final   Chloride 02/19/2024 99  99 - 108 Final    Globulin 02/19/2024 2.4   Final   eGFR 02/19/2024 53   Final   Calcium  02/19/2024 10.0  8.7 - 10.7 Final   Albumin  02/19/2024 4.4  3.5 - 5.0 Final   Alkaline Phosphatase 02/19/2024 76  25 - 125 Final   ALT 02/19/2024 19  10 - 40 U/L Final   AST 02/19/2024 19  14 - 40 Final   Bilirubin, Total 02/19/2024 0.9   Final  Office Visit on 03/20/2024  Component Date Value Ref Range Status   Sed Rate 03/20/2024 5  0 - 20 mm/hr Final   CRP 03/20/2024 <0.5  0.5 - 20.0 mg/dL Final  Office Visit on 11/06/2023  Component Date Value Ref Range Status   Cholesterol 11/06/2023 231 (H)  0 - 200 mg/dL Final   Triglycerides 94/86/7974 246.0 (H)  0.0 - 149.0 mg/dL Final   HDL 94/86/7974 47.70  >39.00 mg/dL Final   VLDL 94/86/7974 49.2 (H)  0.0 - 40.0 mg/dL Final   LDL Cholesterol 11/06/2023 134 (H)  0 - 99 mg/dL Final   Total CHOL/HDL Ratio 11/06/2023 5   Final   NonHDL 11/06/2023 183.09   Final   Hgb A1c MFr Bld 11/06/2023 5.8  4.6 - 6.5 % Final  Office Visit on 09/04/2022  Component Date Value Ref Range Status   WBC 09/04/2022 6.1  4.0 - 10.5 K/uL Final   RBC 09/04/2022 5.61  4.22 - 5.81 Mil/uL Final   Platelets 09/04/2022 160.0  150.0 - 400.0 K/uL Final   Hemoglobin 09/04/2022 19.0 Repeated and verified X2. (HH)  13.0 - 17.0 g/dL Final   HCT 96/88/7975 54.8 (H)  39.0 - 52.0 % Final   MCV 09/04/2022 97.7  78.0 - 100.0 fl Final   MCHC 09/04/2022 34.6  30.0 - 36.0 g/dL Final   RDW 96/88/7975 14.1  11.5 - 15.5 % Final   Sodium 09/04/2022 139  135 - 145 mEq/L Final   Potassium 09/04/2022 4.0  3.5 - 5.1 mEq/L Final   Chloride 09/04/2022 101  96 - 112 mEq/L Final   CO2 09/04/2022 30  19 - 32 mEq/L Final   Glucose, Bld 09/04/2022 99  70 - 99 mg/dL Final   BUN 96/88/7975 21  6 - 23 mg/dL Final   Creatinine, Ser 09/04/2022 1.43  0.40 - 1.50 mg/dL Final   Total Bilirubin 09/04/2022 1.2  0.2 - 1.2 mg/dL Final   Alkaline Phosphatase 09/04/2022 73  39 - 117 U/L Final   AST 09/04/2022 21  0 - 37 U/L Final    ALT 09/04/2022 22  0 - 53 U/L Final   Total Protein 09/04/2022 7.1  6.0 - 8.3 g/dL Final   Albumin  09/04/2022 4.2  3.5 - 5.2 g/dL Final   GFR 96/88/7975 51.23 (L)  >60.00 mL/min Final   Calcium  09/04/2022 10.4  8.4 - 10.5 mg/dL Final   Hgb J8r MFr Bld 09/04/2022 5.5  4.6 - 6.5 % Final   PSA 09/04/2022 3.08  0.10 - 4.00 ng/mL Final   Cholesterol 09/04/2022 190  0 - 200 mg/dL Final   Triglycerides 96/88/7975 208.0 (H)  0.0 - 149.0 mg/dL Final   HDL 96/88/7975 36.80 (L)  >60.99 mg/dL Final   VLDL 96/88/7975 41.6 (H)  0.0 - 40.0 mg/dL Final   Total CHOL/HDL Ratio 09/04/2022 5   Final   NonHDL 09/04/2022 153.21  Final   Uric Acid, Serum 09/04/2022 4.9  4.0 - 7.8 mg/dL Final   Direct LDL 96/88/7975 125.0  mg/dL Final  No image results found. No results found.       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 Currently on 300 mg three times daily, previous tapering attempts led to severe symptoms. He wishes to discontinue gabapentin  without severe withdrawal. A gradual tapering plan has been initiated: 300 mg three times daily for one week, then 200 mg three times daily for the next week, followed by 200 mg in the morning, 200 mg in the afternoon, and 100 mg in the evening for the third week, then 100 mg three times daily for the fourth week, and finally 100 mg in the morning for the fifth week. Guanfacine is provided for as-needed use to manage adrenaline spikes and anxiety, and buspirone  is provided for daily use to help manage anxiety during the tapering process. He is educated on the importance of a slow taper to minimize withdrawal symptoms and the potential for withdrawal symptoms to be mistaken for other conditions, emphasizing the importance of monitoring for severe  symptoms.  A 67-month gabapentin  taper from 900 mg/day to zero can be achieved by reducing the total daily dose by approximately 200-300 mg per week, using a combination of 100 mg and 300 mg capsules. The FDA labeling recommends tapering over at least 1 week to minimize withdrawal symptoms and seizure risk, though some patients may require slower tapers depending on tolerance and withdrawal symptoms.[1-4] The following schedule provides a structured approach, with weekly dose reductions distributed across the three daily doses: Week Total Daily Dose Morning Dose Afternoon Dose Evening Dose Capsule Combination Notes  Week 1 800 mg 300 mg 300 mg 200 mg 1300 mg TID Baseline dose  Week 2 700 mg 300 mg 200 mg 200 mg AM: 1300 mg Afternoon: 2100 mg PM: 2100 mg Reduce afternoon and evening doses  Week 3 500 mg 200 mg 200 mg 100 mg AM: 2100 mg Afternoon: 2100 mg PM: 1100 mg Reduce morning and evening doses  Week 4 300 mg 100 mg 100 mg 100 mg 1100 mg TID Reduce all doses to 100 mg  Week 5 100 mg ? 0 mg 100 mg ? 0 mg -- -- 1100 mg QD for 3-7 days, then discontinue Final reduction; monitor closely  Important considerations: Monitor for withdrawal symptoms (anxiety, insomnia, nausea, pain, sweating). If withdrawal symptoms emerge, slow the taper by reducing dose decrements to 50-100 mg per week or extending the duration between reductions.    1. The Quickest Way: Call the [facility] Concierge The Concierge is a dedicated team just for [facility] Plan members. They are trained to navigate these specific benefits. Phone Number: [phone] (TTY: [phone]) What to say: I am a retiree looking to start Lifestyle and Condition Coaching. Hours: Monday-Friday, 8 a.m. - 5 p.m. ET. 2. For Wellness/Health Coaching (Habits & Health) If he wants a coach for weight management, stress, or nutrition, he should ask for the Lifestyle and Condition Coaching (LCC) program. How it works: This is a telephonic  program where he'll be paired with a coach to set goals. Cost: This is a $0 benefit for members. Self-Enrollment: He can also log in to the [employer] or the [facility]  app and look for Wellness Programs to sign up digitally. 3. For the Life Transition Support (Therapy/Counseling) Since Life Coaching isn't a standard medical benefit, the best way to get this covered is through the [employer] (BHAP). This will allow him to talk to a professional about the emotional side of retirement and life changes for a standard office copay. Direct Behavioral Health Line: [phone] What to ask: I'd like to find an in-network provider who specializes in life transitions or geriatric counseling. Pro-Tip: Many providers now offer Telehealth (video calls), which is often the fastest way to get an appointment. 4. Group-Based Market Researcher (Live Online) [place] retirees have access to a very Herbalist, Move More, Weigh Less. While it sounds like a diet program, it is actually a highly interactive, 15-week online class led by a live professional that acts much like a life coach for daily habits. Website: [email or web address] Cost: It is often free or fully reimbursed for [facility] members. He can check his eligibility directly on their site using his [employer] ID. Peripheral edema He has moderate to severe peripheral edema, likely exacerbated by gabapentin  use. Currently on furosemide , but reports limited improvement in fluid retention. Continue furosemide  as prescribed and monitor for changes in edema with gabapentin  tapering.  I suspect this is secondary to untreated, unconfirmed obstructive sleep apnea - he declines to be tested for it. Suspected sleep apnea Suspected obstructive sleep apnea is based on symptoms of waking up short of breath and elevated blood pressure at night. Also anxiety disorder(s) and polycythemia are present. Potential  benefits of CPAP therapy, including improved sleep quality and reduced anxiety, were discussed, but he is hesitant about CPAP due to concerns about mask use. He is referred to a sleep specialist for evaluation and potential CPAP therapy. Alternative treatments such as dental devices and Inspire therapy were also discussed. Chronic pain syndrome Chronic pain management is challenging due to multiple joint replacements and sensitivity to medications. Gabapentin  was ineffective for pain relief and contributed to withdrawal symptoms. Focus on tapering off gabapentin  as per the withdrawal management plan. Primary hypertension Blood pressure spikes during gabapentin  withdrawal. Continue to monitor blood pressure during gabapentin  tapering. Suspicious for possible secondary hypertension-Discussed potential impact of sleep apnea on blood pressure and the benefits of addressing sleep apnea.    ORDER ASSOCIATIONS  #   DIAGNOSIS / CONDITION ICD-10 ENCOUNTER ORDER     ICD-10-CM   1. Withdrawal from sedative drug [facility])  F13.939 Ambulatory referral to Psychology    busPIRone  (BUSPAR ) 5 MG tablet    2. Anxiety disorder, unspecified type  F41.9 busPIRone  (BUSPAR ) 5 MG tablet    guanFACINE (TENEX) 1 MG tablet    3. Peripheral edema  R60.0     4. Suspected sleep apnea  R29.818     5. Chronic pain syndrome  G89.4     6. Primary hypertension  I10          Orders Placed in Encounter:   Referral Orders         Ambulatory referral to Psychology     Meds ordered this encounter  Medications   busPIRone  (BUSPAR ) 5 MG tablet    Sig: Take 1 tablet (5 mg total) by mouth 2 (two) times daily. Must take twice daily for it to work, takes a few weeks to do anything and stop when done with gabapentin  taper.    Dispense:  60 tablet    Refill:  2  guanFACINE (TENEX) 1 MG tablet    Sig: Take 1 tablet (1 mg total) by mouth at bedtime. Take as needed for adrenaline spikes    Dispense:  30 tablet    Refill:  2    Orders Placed This Encounter  Procedures   Ambulatory referral to Psychology    Referral Priority:   Routine    Referral Type:   Psychiatric    Referral Reason:   Specialty Services Required    Requested Specialty:   Psychology    Number of Visits Requested:   1     This document was synthesized by artificial intelligence ([employer]bridge) using HIPAA-compliant recording of the clinical interaction;   We discussed the use of AI scribe software for clinical note transcription with the patient, who gave verbal consent to proceed. additional Info: This encounter employed state-of-the-art, real-time, collaborative documentation. The patient actively reviewed and assisted in updating their electronic medical record on a shared screen, ensuring transparency and facilitating joint problem-solving for the problem list, overview, and plan. This approach promotes accurate, informed care. The treatment plan was discussed and reviewed in detail, including medication safety, potential side effects, and all patient questions. We confirmed understanding and comfort with the plan. Follow-up instructions were established, including contacting the office for any concerns, returning if symptoms worsen, persist, or new symptoms develop, and precautions for potential emergency department visits.

## 2024-06-23 NOTE — Assessment & Plan Note (Signed)
 Blood pressure spikes during gabapentin  withdrawal. Continue to monitor blood pressure during gabapentin  tapering. Suspicious for possible secondary hypertension-Discussed potential impact of sleep apnea on blood pressure and the benefits of addressing sleep apnea.

## 2024-06-23 NOTE — Patient Instructions (Addendum)
 A 71-month gabapentin  taper from 900 mg/day to zero can be achieved by reducing the total daily dose by approximately 200-300 mg per week, using a combination of 100 mg and 300 mg capsules. The FDA labeling recommends tapering over at least 1 week to minimize withdrawal symptoms and seizure risk, though some patients may require slower tapers depending on tolerance and withdrawal symptoms.[1-4] The following schedule provides a structured approach, with weekly dose reductions distributed across the three daily doses: Week Total Daily Dose Morning Dose Afternoon Dose Evening Dose Capsule Combination Notes  Week 1 800 mg 300 mg 300 mg 200 mg 1300 mg TID Baseline dose  Week 2 700 mg 300 mg 200 mg 200 mg AM: 1300 mg Afternoon: 2100 mg PM: 2100 mg Reduce afternoon and evening doses  Week 3 500 mg 200 mg 200 mg 100 mg AM: 2100 mg Afternoon: 2100 mg PM: 1100 mg Reduce morning and evening doses  Week 4 300 mg 100 mg 100 mg 100 mg 1100 mg TID Reduce all doses to 100 mg  Week 5 100 mg ? 0 mg 100 mg ? 0 mg -- -- 1100 mg QD for 3-7 days, then discontinue Final reduction; monitor closely   Important considerations: Monitor for withdrawal symptoms (anxiety, insomnia, nausea, pain, sweating). If withdrawal symptoms emerge, slow the taper by reducing dose decrements to 50-100 mg per week or extending the duration between reductions.    1. The Quickest Way: Call the [facility] Concierge The Concierge is a dedicated team just for [facility] members. They are trained to navigate these specific benefits. Phone Number: [phone] (TTY: [phone]) What to say: I am a retiree looking to start Lifestyle and Condition Coaching. Hours: Monday-Friday, 8 a.m. - 5 p.m. ET. 2. For Wellness/Health Coaching (Habits & Health) If he wants a coach for weight management, stress, or nutrition, he should ask for the Lifestyle and Condition Coaching (LCC) program. How it works: This is a telephonic program  where he'll be paired with a coach to set goals. Cost: This is a $0 benefit for members. Self-Enrollment: He can also log in to the [employer] Portal or the [facility] app and look for Wellness Programs to sign up digitally. 3. For the Life Transition Support (Therapy/Counseling) Since Life Coaching isn't a standard medical benefit, the best way to get this covered is through the [employer] (BHAP). This will allow him to talk to a professional about the emotional side of retirement and life changes for a standard office copay. Direct Behavioral Health Line: [phone] What to ask: I'd like to find an in-network provider who specializes in life transitions or geriatric counseling. Pro-Tip: Many providers now offer Telehealth (video calls), which is often the fastest way to get an appointment. 4. Group-Based Market Researcher (Live Online) [place] retirees have access to a very Herbalist, Move More, Weigh Less. While it sounds like a diet program, it is actually a highly interactive, 15-week online class led by a live professional that acts much like a life coach for daily habits. Website: [email or web address] Cost: It is often free or fully reimbursed for [facility] members. He can check his eligibility directly on their site using his [employer] ID.    How to Use Your Anxiety Medications: Guanfacine and Buspirone   What These Medications Do   Buspirone  is a medication specifically designed to treat anxiety. It works differently from sedatives like benzodiazepines--it affects serotonin receptors in your brain  to help reduce worry, nervousness, and physical symptoms of anxiety like muscle tension and restlessness.[1][2] Buspirone  does not cause drowsiness for most people and does not lead to dependence or addiction.[3][4]  Guanfacine is a medication that affects norepinephrine (a stress chemical) in your brain. While it's primarily  approved for ADHD, it can help with anxiety symptoms by calming the stress response system in your brain.[5][6] It may help reduce physical symptoms of anxiety like rapid heartbeat, sweating, and feeling on edge. How to Take Buspirone    Starting dose: Take 7.5 mg by mouth twice daily (morning and evening), for a total of 15 mg per day.[1][7]  Increasing the dose: Your doctor may increase your dose by 5 mg every 2-3 days as needed. The maximum dose is 60 mg per day, usually divided into 2-3 doses throughout the day.[1]  Important timing: Take buspirone  at the same times each day. You can take it with or without food, but be consistent--choose one way and stick with it.[1][7]  When it starts working: Buspirone  takes 2 to 4 weeks to reach its full effect, so don't be discouraged if you don't feel better right away.[3][4] Some people may notice improvement sooner, but give it time to work.  Common side effects: Dizziness, nausea, headache, nervousness, and lightheadedness are the most common side effects.[1] These often improve as your body adjusts to the medication. How to Take Guanfacine   Starting dose: Your doctor will likely start you on a low dose (usually 1 mg once daily) and increase slowly.[6]  Timing: Take guanfacine at the same time each day, either in the morning or evening. Swallow the tablet whole--do not crush, chew, or break it.[6]  Important warnings:   - Guanfacine can lower your blood pressure and heart rate, so stand up slowly from sitting or lying down to avoid dizziness.[6]  - It can cause drowsiness, so be careful when driving or operating machinery until you know how it affects you.[6]  - Do not stop guanfacine suddenly--your doctor will need to taper you off slowly to avoid a sudden increase in blood pressure.[6]  Common side effects: Drowsiness, fatigue, dizziness, dry mouth, and low blood pressure are common.[6] These effects are usually most noticeable when  starting the medication or increasing the dose. Important Reminders   - Take both medications exactly as prescribed, even if you start feeling better  - Do not stop either medication suddenly without talking to your doctor  - Avoid alcohol  while taking these medications, as it can increase drowsiness and dizziness  - Keep all follow-up appointments so your doctor can monitor your progress and adjust doses if needed  - Call your doctor if you experience severe dizziness, fainting, very slow heartbeat, or worsening anxiety Questions to Ask Your Doctor   - What dose am I starting with for each medication?  - When should I expect to feel better?  - What side effects should I watch for?  - When should I call you about side effects or concerns? References Buspirone  Hydrochloride. Food and Drug Administration. Updated date: 2023-09-14. Buspirone : What Is It All About?SABRA Loane C, Politis M. Brain Research. 2012;1461:111-8. doi:10.1016/j.brainres.2012.04.032. Psychopharmacology and Cardiovascular Disease. Thurmond SAUNDERS Leona Morene LANEY Darroll HO. Journal of the [employer] of Cardiology. 2018;71(20):2346-2359. doi:10.1016/j.jacc.2018.03.458. Generalized Anxiety Disorder. Fricchione G. The New England Journal of Medicine. 2004;351(7):675-82. doi:10.1056/NEJMcp022342. Guanfacine's Mechanism of Action in Treating Prefrontal Cortical Disorders: Successful Translation Across Species. Arnsten AFT. Neurobiology of Learning and Memory. 2020;176:107327. doi:10.1016/j.nlm.7979.892672. [employer]iv. Food and Drug Administration.  Updated date: 2023-12-17. Buspirone  hydrochloride. Food and Drug Administration. Updated date: 2024-04-01.  It was a pleasure seeing you today! Your health and satisfaction are our top priorities.  Bernardino Cone, MD  VISIT SUMMARY: Today, we discussed your difficulties with tapering off gabapentin  due to withdrawal symptoms and the side effects you are experiencing. We also reviewed  your concerns about fluid retention, potential sleep apnea, chronic pain from multiple joint replacements, and blood pressure spikes.  YOUR PLAN: -GABAPENTIN  WITHDRAWAL MANAGEMENT: Gabapentin  withdrawal can cause symptoms like anxiety and increased blood pressure. We have created a gradual tapering plan to help you discontinue gabapentin  with minimal withdrawal symptoms. You will start with 300 mg three times daily for one week, then reduce to 200 mg three times daily for the next week, followed by 200 mg in the morning, 200 mg in the afternoon, and 100 mg in the evening for the third week, then 100 mg three times daily for the fourth week, and finally 100 mg in the morning for the fifth week. Guanfacine is provided for as-needed use to manage adrenaline spikes and anxiety, and buspirone  is provided for daily use to help manage anxiety during the tapering process. It is important to follow this plan closely and monitor for severe symptoms.  -PERIPHERAL EDEMA: Peripheral edema is swelling caused by fluid retention, often in the ankles and feet. You will continue taking furosemide  as prescribed and monitor for changes in swelling as you taper off gabapentin .  -OBSTRUCTIVE SLEEP APNEA: Obstructive sleep apnea is a condition where breathing repeatedly stops and starts during sleep. You have been referred to a sleep specialist for evaluation and potential CPAP therapy. We also discussed alternative treatments such as dental devices and Inspire therapy.  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 Since gabapentin  was not effective for pain relief, we will focus on tapering off gabapentin  as per the withdrawal management plan.  -HYPERTENSION: Hypertension is high blood pressure. We will continue to monitor your blood pressure during the gabapentin  tapering process. Addressing your potential sleep apnea may also help manage your  blood pressure.  INSTRUCTIONS: Please follow the gabapentin  tapering plan as outlined and monitor for any severe withdrawal symptoms. Continue taking furosemide  as prescribed and keep track of any changes in swelling. Attend the referral appointment with the sleep specialist to evaluate your potential sleep apnea and discuss treatment options. Monitor your blood pressure regularly, especially during the gabapentin  tapering process. If you experience any severe symptoms or have concerns, please contact our office.  Your Providers PCP: Kennyth Worth HERO, MD,  [phone]) Referring Provider: Kennyth Worth HERO, MD,  [phone]) Care Team Provider: Sharl Selinda Dover, MD,  [phone]) Care Team Provider: Alverda Mardy SAUNDERS, MD,  [phone])  NEXT STEPS: [x]  Early Intervention: Schedule sooner appointment, call our on-call services, or go to emergency room if there is any significant Increase in pain or discomfort New or worsening symptoms Sudden or severe changes in your health [x]  Flexible Follow-Up: We recommend a No follow-ups on file. for optimal routine care. This allows for progress monitoring and treatment adjustments. [x]  Preventive Care: Schedule your annual preventive care visit! It's typically covered by insurance and helps identify potential health issues early. [x]  Lab & X-ray Appointments: Incomplete tests scheduled today, or call to schedule. X-rays: [facility] (M-F, 8:30am-noon or 1pm-5pm). [x]  Medical Information Release: Sign a release form at front desk to obtain relevant medical information we don't  have.  MAKING THE MOST OF OUR FOCUSED 20 MINUTE APPOINTMENTS: [x]   Clearly state your top concerns at the beginning of the visit to focus our discussion [x]   If you anticipate you will need more time, please inform the front desk during scheduling - we can book multiple appointments in the same week. [x]   If you have transportation problems- use our  convenient video appointments or ask about transportation support. [x]   We can get down to business faster if you use MyChart to update information before the visit and submit non-urgent questions before your visit. Thank you for taking the time to provide details through MyChart.  Let our nurse know and she can import this information into your encounter documents.  Arrival and Wait Times: [x]   Arriving on time ensures that everyone receives prompt attention. [x]   Early morning (8a) and afternoon (1p) appointments tend to have shortest wait times. [x]   Unfortunately, we cannot delay appointments for late arrivals or hold slots during phone calls.  Getting Answers and Following Up [x]   Simple Questions & Concerns: For quick questions or basic follow-up after your visit, reach us  at [phone] or MyChart messaging. [x]   Complex Concerns: If your concern is more complex, scheduling an appointment might be best. Discuss this with the staff to find the most suitable option. [x]   Lab & Imaging Results: We'll contact you directly if results are abnormal or you don't use MyChart. Most normal results will be on MyChart within 2-3 business days, with a review message from Dr. Jesus. Haven't heard back in 2 weeks? Need results sooner? Contact us  at [phone]. [x]   Referrals: Our referral coordinator will manage specialist referrals. The specialist's office should contact you within 2 weeks to schedule an appointment. Call us  if you haven't heard from them after 2 weeks.  Staying Connected [x]   MyChart: Activate your MyChart for the fastest way to access results and message us . See the last page of this paperwork for instructions on how to activate.  Bring to Your Next Appointment [x]   Medications: Please bring all your medication bottles to your next appointment to ensure we have an accurate record of your prescriptions. [x]   Health Diaries: If you're monitoring any health conditions at home,  keeping a diary of your readings can be very helpful for discussions at your next appointment.  Billing [x]   X-ray & Lab Orders: These are billed by separate companies. Contact the invoicing company directly for questions or concerns. [x]   Visit Charges: Discuss any billing inquiries with our administrative services team.  Your Satisfaction Matters [x]   Share Your Experience: We strive for your satisfaction! If you have any complaints, or preferably compliments, please let Dr. Jesus know directly or contact our Practice Administrators, Manuelita Rubin or [employer], by asking at the front desk.   Reviewing Your Records [x]   Review this early draft of your clinical encounter notes below and the final encounter summary tomorrow on MyChart after its been completed.  All orders placed so far are visible here: Withdrawal from sedative drug (HCC)

## 2024-06-27 ENCOUNTER — Other Ambulatory Visit: Payer: Self-pay | Admitting: Family Medicine

## 2024-07-02 ENCOUNTER — Emergency Department (HOSPITAL_BASED_OUTPATIENT_CLINIC_OR_DEPARTMENT_OTHER)
Admission: EM | Admit: 2024-07-02 | Discharge: 2024-07-02 | Disposition: A | Discharge status: Home / Self Care | Attending: Emergency Medicine | Admitting: Emergency Medicine

## 2024-07-02 ENCOUNTER — Encounter (HOSPITAL_BASED_OUTPATIENT_CLINIC_OR_DEPARTMENT_OTHER): Payer: Self-pay | Admitting: *Deleted

## 2024-07-02 ENCOUNTER — Other Ambulatory Visit: Payer: Self-pay

## 2024-07-02 ENCOUNTER — Emergency Department (HOSPITAL_BASED_OUTPATIENT_CLINIC_OR_DEPARTMENT_OTHER)

## 2024-07-02 DIAGNOSIS — R609 Edema, unspecified: Secondary | ICD-10-CM

## 2024-07-02 DIAGNOSIS — R600 Localized edema: Secondary | ICD-10-CM | POA: Insufficient documentation

## 2024-07-02 LAB — COMPREHENSIVE METABOLIC PANEL WITH GFR
ALT: 20 U/L (ref 0–44)
AST: 23 U/L (ref 15–41)
Albumin: 4.4 g/dL (ref 3.5–5.0)
Alkaline Phosphatase: 71 U/L (ref 38–126)
Anion gap: 11 (ref 5–15)
BUN: 21 mg/dL (ref 8–23)
CO2: 30 mmol/L (ref 22–32)
Calcium: 10.1 mg/dL (ref 8.9–10.3)
Chloride: 101 mmol/L (ref 98–111)
Creatinine, Ser: 1.56 mg/dL — ABNORMAL HIGH (ref 0.61–1.24)
GFR, Estimated: 48 mL/min — ABNORMAL LOW
Glucose, Bld: 91 mg/dL (ref 70–99)
Potassium: 3.9 mmol/L (ref 3.5–5.1)
Sodium: 142 mmol/L (ref 135–145)
Total Bilirubin: 0.7 mg/dL (ref 0.0–1.2)
Total Protein: 7 g/dL (ref 6.5–8.1)

## 2024-07-02 LAB — CBC WITH DIFFERENTIAL/PLATELET
Abs Immature Granulocytes: 0.01 K/uL (ref 0.00–0.07)
Basophils Absolute: 0.1 K/uL (ref 0.0–0.1)
Basophils Relative: 1 %
Eosinophils Absolute: 0.4 K/uL (ref 0.0–0.5)
Eosinophils Relative: 6 %
HCT: 50 % (ref 39.0–52.0)
Hemoglobin: 17.5 g/dL — ABNORMAL HIGH (ref 13.0–17.0)
Immature Granulocytes: 0 %
Lymphocytes Relative: 21 %
Lymphs Abs: 1.4 K/uL (ref 0.7–4.0)
MCH: 31.3 pg (ref 26.0–34.0)
MCHC: 35 g/dL (ref 30.0–36.0)
MCV: 89.4 fL (ref 80.0–100.0)
Monocytes Absolute: 0.8 K/uL (ref 0.1–1.0)
Monocytes Relative: 12 %
Neutro Abs: 4 K/uL (ref 1.7–7.7)
Neutrophils Relative %: 60 %
Platelets: 162 K/uL (ref 150–400)
RBC: 5.59 MIL/uL (ref 4.22–5.81)
RDW: 13.1 % (ref 11.5–15.5)
WBC: 6.7 K/uL (ref 4.0–10.5)
nRBC: 0 % (ref 0.0–0.2)

## 2024-07-02 LAB — PRO BRAIN NATRIURETIC PEPTIDE: Pro Brain Natriuretic Peptide: 61.8 pg/mL

## 2024-07-02 NOTE — ED Triage Notes (Signed)
 Pt is is here for swelling in his feet and legs which has been increasing. Pt states that this initially began over a month ago but has really increased in the past few days. Pt is tapering off gabapentin . No CP and no SOB

## 2024-07-02 NOTE — ED Provider Notes (Addendum)
 " [place] EMERGENCY DEPARTMENT AT [facility] Provider Note   CSN: [id number] Arrival date & time: 07/02/24  0118     Patient presents with: Leg Swelling   Geoffrey West is a 69 y.o. male.   69 yo M with a chief complaint of lower extremity edema.  This been going on for about a week he thinks.  He is currently struggling with medications.  He feels like he has had a number of side effects due to gabapentin .  He is currently trying to be weaned off of this.  He denies any other specific new medications.  Denies difficulty breathing.        Prior to Admission medications  Medication Sig Start Date End Date Taking? Authorizing Provider  amLODipine  (NORVASC ) 10 MG tablet Take 1 tablet (10 mg total) by mouth at bedtime. 11/06/23   Kennyth Worth HERO, MD  ARMOUR THYROID  60 MG tablet 1 tablet on an empty stomach Orally Once a day for 90 days 07/09/19   [provider]  busPIRone  (BUSPAR ) 5 MG tablet Take 1 tablet (5 mg total) by mouth 2 (two) times daily. Must take twice daily for it to work, takes a few weeks to do anything and stop when done with gabapentin  taper. 06/23/24   Jesus Bernardino MATSU, MD  celecoxib  (CELEBREX ) 200 MG capsule TAKE 1 CAPSULE BY MOUTH 2 TIMES A DAY 06/27/24   Kennyth Worth HERO, MD  clonazePAM  (KLONOPIN ) 0.5 MG tablet Take 1 tablet (0.5 mg total) by mouth 3 (three) times daily as needed for anxiety. 06/12/24   Kennyth Worth HERO, MD  diphenhydramine -acetaminophen  (TYLENOL  PM) 25-500 MG TABS tablet Take 1 tablet by mouth at bedtime as needed (sleep).    [provider]  fluticasone  (FLONASE ) 50 MCG/ACT nasal spray Place 2 sprays into both nostrils daily. 05/30/24   Kennyth Worth HERO, MD  furosemide  (LASIX ) 20 MG tablet Take 1 tablet (20 mg total) by mouth daily as needed for fluid or edema. 06/18/24   Allwardt, Alyssa M, PA-C  gabapentin  (NEURONTIN ) 100 MG capsule Take 300 mg in am and pm x 5 days, then 200 mg in am and 300 mg in pm x 5 days, then 200 mg in  am and pm x 5 days, then 100 mg in am and 200 mg in pm x 5 days, then 100 mg in am and pm x 5 days, then only take 100 mg in pm x 5 days, then off. 06/16/24   [name], PA  guanFACINE  (TENEX ) 1 MG tablet Take 1 tablet (1 mg total) by mouth at bedtime. Take as needed for adrenaline spikes 06/23/24   Jesus Bernardino MATSU, MD  hydrOXYzine  (ATARAX ) 25 MG tablet Take 1 tablet (25 mg total) by mouth every 8 (eight) hours as needed for up to 30 doses for anxiety. Patient taking differently: Take 25 mg by mouth 3 (three) times daily. 06/15/24   Curatolo, Adam, DO  NEEDLE, DISP, 18 G (B-D HYPODERMIC NEEDLE 18GX1.5) 18G X 1-1/2 MISC Hypodermic Needles 18 gauge x 1 1/2  USE AS DIRECTED WITH TESTOSTERONE     [provider]  pantoprazole  (PROTONIX ) 40 MG tablet TAKE 1 TABLET BY MOUTH EVERY MORNING 12/06/23   Kennyth Worth HERO, MD  propranolol  ER (INDERAL  LA) 80 MG 24 hr capsule TAKE ONE CAPSULE BY MOUTH EVERY EVENING 01/10/24   Kennyth Worth HERO, MD  testosterone  cypionate (DEPOTESTOSTERONE CYPIONATE) 200 MG/ML injection Inject 140 mg into the muscle every Wednesday.  [provider]    Allergies: Fentanyl , Gabapentin , Pregabalin, and Oxycodone    Review of Systems  Updated Vital Signs BP (!) 168/95   Pulse 65   Temp 97.9 F (36.6 C) (Oral)   Resp 16   Wt 103.5 kg   SpO2 99%   BMI 31.82 kg/m   Physical Exam Vitals and nursing note reviewed.  Constitutional:      Appearance: He is well-developed.  HENT:     Head: Normocephalic and atraumatic.  Eyes:     Pupils: Pupils are equal, round, and reactive to light.  Neck:     Vascular: No JVD.  Cardiovascular:     Rate and Rhythm: Normal rate and regular rhythm.     Heart sounds: No murmur heard.    No friction rub. No gallop.  Pulmonary:     Effort: No respiratory distress.     Breath sounds: No wheezing.  Abdominal:     General: There is no distension.     Tenderness: There is no abdominal tenderness. There is no  guarding or rebound.  Musculoskeletal:        General: Normal range of motion.     Cervical back: Normal range of motion and neck supple.     Right lower leg: Edema present.     Left lower leg: Edema present.     Comments: 1-2+ lower extremity edema up to the mid shin.  Skin:    Coloration: Skin is not pale.     Findings: No rash.  Neurological:     Mental Status: He is alert and oriented to person, place, and time.  Psychiatric:        Behavior: Behavior normal.     (all labs ordered are listed, but only abnormal results are displayed) Labs Reviewed  CBC WITH DIFFERENTIAL/PLATELET - Abnormal; Notable for the following components:      Result Value   Hemoglobin 17.5 (*)    All other components within normal limits  COMPREHENSIVE METABOLIC PANEL WITH GFR - Abnormal; Notable for the following components:   Creatinine, Ser 1.56 (*)    GFR, Estimated 48 (*)    All other components within normal limits  PRO BRAIN NATRIURETIC PEPTIDE    EKG: None  Radiology: DG Chest Port 1 View Result Date: 07/02/2024 EXAM: 1 VIEW(S) XRAY OF THE CHEST 07/02/2024 02:15:47 AM COMPARISON: 07/31/2012 CLINICAL HISTORY: sob sob FINDINGS: LUNGS AND PLEURA: Lungs are clear. No pleural effusion. No pneumothorax. HEART AND MEDIASTINUM: No acute abnormality of the cardiac and mediastinal silhouettes. BONES AND SOFT TISSUES: No acute osseous abnormality. IMPRESSION: 1. No active cardiopulmonary disease. Electronically signed by: Franky Crease MD 07/02/2024 02:22 AM EST RP Workstation: [id number]     Procedures   Medications Ordered in the ED - No data to display                                  Medical Decision Making Amount and/or Complexity of Data Reviewed Labs: ordered. Radiology: ordered.   69 yo M with a chief complaints of bilateral lower extremity edema.  This been going on for about a week.  Patient unfortunately has had multiple recent emergency department visits for issues with  gabapentin .  No significant electrolyte abnormalities.  No change to renal function.  LFTs are unremarkable.  BNP negative.  Chest x-ray on my independent to potation without obvious focal infiltrate or pneumothorax.  Let the patient  follow-up with his PCP.  Discussed supportive measures.  Patient expressed some frustration.  Told me he has actually had leg swelling since September.  He has been to the ED multiple times and has not felt like we have helped him that much.  He thinks that his symptoms are due to the gabapentin .   3:05 AM:  I have discussed the diagnosis/risks/treatment options with the patient.  Evaluation and diagnostic testing in the emergency department does not suggest an emergent condition requiring admission or immediate intervention beyond what has been performed at this time.  They will follow up with PCP. We also discussed returning to the ED immediately if new or worsening sx occur. We discussed the sx which are most concerning (e.g., sudden worsening pain, fever, inability to tolerate by mouth) that necessitate immediate return. Medications administered to the patient during their visit and any new prescriptions provided to the patient are listed below.  Medications given during this visit Medications - No data to display   The patient appears reasonably screen and/or stabilized for discharge and I doubt any other medical condition or other [facility] requiring further screening, evaluation, or treatment in the ED at this time prior to discharge.       Final diagnoses:  Edema, unspecified type    ED Discharge Orders     None          Emil Share, DO 07/02/24 0305    Emil Share, DO 07/02/24 0350  "

## 2024-07-02 NOTE — Discharge Instructions (Signed)
 Your test here look okay.  Try to elevate your legs when you are not up and moving around.  You can also try compression stockings.  Please discuss this with your family physician.  They may want to run further tests.

## 2024-07-03 ENCOUNTER — Ambulatory Visit: Payer: Self-pay | Admitting: *Deleted

## 2024-07-03 NOTE — Telephone Encounter (Signed)
 FYI Only or Action Required?: Action required by provider: request for appointment, clinical question for provider, update on patient condition, and has been to ED  multiple times with no relief.  Patient was last seen in primary care on 06/23/2024 by Jesus Bernardino MATSU, MD.  Called Nurse Triage reporting Leg Swelling.  Symptoms began several weeks ago.  Interventions attempted: Prescription medications: lasix  .  Symptoms are: gradually worsening.  Triage Disposition: See Physician Within 24 Hours  Patient/caregiver understands and will follow disposition?: No, wishes to speak with PCP   Recommended UC and patient does not want to go to UC or ED due to going multiple times with no relief. Requesting PCP only and reports neurologist recommended PCP .              Copied from CRM #[id number]. Topic: Clinical - Red Word Triage >> Jul 03, 2024  1:22 PM Shereese L wrote: [facility] that prompted transfer to Nurse Triage: Patient is having Withdrawals from medication gabapentin  (NEURONTIN ) 100 MG capsule and legs/feet are swollen Reason for Disposition  [1] MODERATE leg swelling (e.g., swelling extends up to knees) AND [2] new-onset or getting worse  Answer Assessment - Initial Assessment Questions No available appt today or tomorrow. Patient only wants to see PCP due to multiple ED visits, continued swelling in legs, feet. On gabapentin  taper to get off of medication. Reports he has stopped guanfacine  3-4 days ago due to possible side effect concerns. Recommended patient to monitor BP every day and to go to [facility] or ED for worsening sx. Patient reports he only lives 500 yards from office and can come anytime. Please advise. Patient recommended UC due to swelling and unable to get appt. Reports neurologist OV today recommended seeing PCP.        1. ONSET: When did the swelling start? (e.g., minutes, hours, days)     5 weeks  2. LOCATION: What part of the leg is swollen?  Are both  legs swollen or just one leg?     Bilateral feet legs up to knees 3. SEVERITY: How bad is the swelling? (e.g., localized; mild, moderate, severe)     Moderate  4. REDNESS: Is there redness or signs of infection?     No  5. PAIN: Is the swelling painful to touch? If Yes, ask: How painful is it?   (Scale 1-10; mild, moderate or severe)     No pain 6. FEVER: Do you have a fever? If Yes, ask: What is it, how was it measured, and when did it start?      no 7. CAUSE: What do you think is causing the leg swelling?     Gabapentin  per neurologist 8. MEDICAL HISTORY: Do you have a history of blood clots (e.g., DVT), cancer, heart failure, kidney disease, or liver failure?     See hx  9. RECURRENT SYMPTOM: Have you had leg swelling before? If Yes, ask: When was the last time? What happened that time?     Yes but not getting better  10. OTHER SYMPTOMS: Do you have any other symptoms? (e.g., chest pain, difficulty breathing)       No chest pain no difficulty breathing no fever no redness. Bilateral feet swelling, legs swelling to knees. Hands swollen at times. Weight gain 12 lbs over past 2 weeks. Feels like withdrawals from gabapentin , feels cold sensations, agitates easily, cold sweats at times. No dizziness.  11. PREGNANCY: Is there any chance you are pregnant? When was your  last menstrual period?       na  Protocols used: Leg Swelling and Edema-A-AH

## 2024-07-03 NOTE — Telephone Encounter (Signed)
 Please schedule a F/U appt with PCP

## 2024-07-04 ENCOUNTER — Other Ambulatory Visit: Payer: Self-pay | Admitting: Physician Assistant

## 2024-07-04 ENCOUNTER — Telehealth: Payer: Self-pay

## 2024-07-04 NOTE — Telephone Encounter (Signed)
 Scheduled for 07/10/24

## 2024-07-04 NOTE — Telephone Encounter (Signed)
 Please review pt request for refill and advise

## 2024-07-04 NOTE — Telephone Encounter (Signed)
 Transition Care Management Follow-up Telephone Call Date of discharge and from where: 07/02/24 [facility] How have you been since you were released from the hospital? Same Any questions or concerns? No  Items Reviewed: Did the pt receive and understand the discharge instructions provided? Yes  Medications obtained and verified? Yes  Other? No  Any new allergies since your discharge? No  Dietary orders reviewed? No Do you have support at home? No   Home Care and Equipment/Supplies: Were home health services ordered? not applicable If so, what is the name of the agency?   Has the agency set up a time to come to the patient's home? not applicable Were any new equipment or medical supplies ordered?  No What is the name of the medical supply agency?  Were you able to get the supplies/equipment? not applicable Do you have any questions related to the use of the equipment or supplies? No  Functional Questionnaire: (I = Independent and D = Dependent) ADLs: I  Bathing/Dressing- I  Meal Prep- I  Eating- I  Maintaining continence- I  Transferring/Ambulation- I  Managing Meds- I  Follow up appointments reviewed:  PCP Hospital f/u appt confirmed? Yes  Scheduled to see Worth Kitty, MD  on 07/10/24 @ 8:40am. Specialist Hospital f/u appt confirmed? N/A  Are transportation arrangements needed? No  If their condition worsens, is the pt aware to call PCP or go to the Emergency Dept.? Yes Was the patient provided with contact information for the PCP's office or ED? Yes Was to pt encouraged to call back with questions or concerns? Yes

## 2024-07-08 ENCOUNTER — Ambulatory Visit (INDEPENDENT_AMBULATORY_CARE_PROVIDER_SITE_OTHER): Admitting: Family Medicine

## 2024-07-08 ENCOUNTER — Encounter: Payer: Self-pay | Admitting: Family Medicine

## 2024-07-08 VITALS — BP 120/80 | HR 83 | Temp 98.4°F | Ht 71.0 in | Wt 224.6 lb

## 2024-07-08 DIAGNOSIS — I10 Essential (primary) hypertension: Secondary | ICD-10-CM

## 2024-07-08 DIAGNOSIS — R600 Localized edema: Secondary | ICD-10-CM | POA: Diagnosis not present

## 2024-07-08 DIAGNOSIS — F419 Anxiety disorder, unspecified: Secondary | ICD-10-CM | POA: Diagnosis not present

## 2024-07-08 DIAGNOSIS — E038 Other specified hypothyroidism: Secondary | ICD-10-CM

## 2024-07-08 DIAGNOSIS — M5417 Radiculopathy, lumbosacral region: Secondary | ICD-10-CM | POA: Diagnosis not present

## 2024-07-08 MED ORDER — AMLODIPINE BESYLATE 10 MG PO TABS: 5.0000 mg | ORAL_TABLET | Freq: Every day | ORAL | 3 refills | Status: DC

## 2024-07-08 MED ORDER — CLONIDINE HCL 0.1 MG PO TABS: 0.1000 mg | ORAL_TABLET | Freq: Three times a day (TID) | ORAL | 3 refills | Status: DC

## 2024-07-08 MED ORDER — FUROSEMIDE 40 MG PO TABS: 40.0000 mg | ORAL_TABLET | Freq: Every day | ORAL | 0 refills | Status: DC

## 2024-07-08 MED ORDER — QUETIAPINE FUMARATE 25 MG PO TABS: 12.5000 mg | ORAL_TABLET | Freq: Two times a day (BID) | ORAL | 0 refills | Status: DC | PRN

## 2024-07-08 NOTE — Assessment & Plan Note (Addendum)
 Patient is currently on gabapentin  300 mg twice daily.  We had extensive discussion today regarding his issues with withdrawal symptoms from coming down the gabapentin .  He has seen multiple providers for this over the last several weeks but is currently on a slow taper as outlined by Dr. Jesus a few weeks ago.  Advised him to continue with his current slow taper though we did discuss strategies to help mitigate some of his symptoms.  Agree with the patient that clonazepam  is not ideal in the situation as would be trading 1 dependency for another.  We did discuss other options to help with managing withdrawal symptoms.  He has done well with the clonidine  in the past for opioid addiction and would like to restart this.  Will start 0.1 mg 3 times daily.  He will follow-up with us  in a few days we can adjust this as needed.  He will continue his current taper strategy and will follow-up with us  in a few days in the office as well for this.  We are managing his anxiety as below.  Also discussed with patient that I would like to see him once weekly until his symptoms resolve.  I will see him back later this week or early next week.  He is agreeable to this plan.

## 2024-07-08 NOTE — Progress Notes (Unsigned)
 "  Geoffrey West is a 69 y.o. male who presents today for an office visit.  Assessment/Plan:  New/Acute Problems: Leg Edema No red flags.  Likely multifactorial.  Likely has some underlying venous stasis though amlodipine  is probably contributing as well.  Recommend he decrease amlodipine  to 0.5 mg daily.  We are checking labs today to rule out other possible causes though overall low suspicion for DVT.  Chronic Problems Addressed Today: Radiculopathy, lumbosacral region Patient is currently on gabapentin  300 mg twice daily.  We had extensive discussion today regarding his issues with withdrawal symptoms from coming down the gabapentin .  He has seen multiple providers for this over the last several weeks but is currently on a slow taper as outlined by Geoffrey West a few weeks ago.  Advised him to continue with his current slow taper though we did discuss strategies to help mitigate some of his symptoms.  Agree with the patient that clonazepam  is not ideal in the situation as would be trading 1 dependency for another.  We did discuss other options to help with managing withdrawal symptoms.  He has done well with the clonidine  in the past for opioid addiction and would like to restart this.  Will start 0.1 mg 3 times daily.  He will follow-up with us  in a few days we can adjust this as needed.  He will continue his current taper strategy and will follow-up with us  in a few days in the office as well for this.  We are managing his anxiety as below.  Also discussed with patient that I would like to see him once weekly until his symptoms resolve.  I will see him back later this week or early next week.  He is agreeable to this plan.  Essential hypertension We are decreasing his amlodipine  to 5 mg daily today due to above leg edema.  He will continue his propranolol  80 mg daily and we are also starting clonidine  0.1 mg 3 times daily as above to help with the legs withdrawal symptoms.  This will also help  with his blood pressure as well.  He will follow-up with us  in a few days as above.  Subclinical hypothyroidism He is on [facility] Thyroid  per endocrinology.  Currently on 60 mg daily.  No TSH available in her chart though does state he will be seeing them next week to recheck.  We will check TSH today to see if this may be contributing some to the symptoms he has been having.  Anxiety Anxiety symptoms have worsened significantly since trying to taper down on the gabapentin .  He does have clonazepam  though as above we are trying to avoid prolonged use of this due to his dependency potential.  We did discuss treatment options to help him especially during the time he is tapering down on the gabapentin .  We will start Seroquel  12.5 to 25 mg twice daily as needed.  Will follow-up with us  in a few days as above.     Subjective:  HPI:  See assessment / plan for status of chronic conditions.   Discussed the use of AI scribe software for clinical note transcription with the patient, who gave verbal consent to proceed.  History of Present Illness Geoffrey West is a 69 year old male who presents with leg swelling and withdrawal symptoms.  He has significant leg swelling, primarily in the left leg, which has worsened over the past week. The swelling extends up to his knee, causing difficulty  in wearing shoes and is accompanied by pain. He has visited the emergency room four times recently due to this issue. He attributes the swelling to gabapentin , which he started recently, correlating with the onset of his symptoms. He has been on amlodipine  for a long time and is aware that it can cause leg swelling, but he believes gabapentin  is the primary cause. He is currently taking gabapentin  300 mg in the morning and 300 mg at night.  He is experiencing withdrawal symptoms from gabapentin , including feeling 'freezing cold for no reason' and coordination issues, such as dropping things and walking  unsteadily. He also reports weight gain and significant anxiety, including 'extreme paranoia' and 'suicidal ideations' without a plan or intent. He has been using clonazepam  more frequently to manage anxiety. He mentions a history of opioid withdrawal, during which clonidine  was used effectively, and expresses a preference for clonidine  over clonazepam  due to concerns about dependency.  He has a history of thyroid  issues and is on thyroid  medication, with the last check being three months ago. He is scheduled for a follow-up next week. He also receives testosterone  shots regularly. He has been experiencing sleep disturbances, occasionally waking up gasping, which was suggested to be related to possible sleep apnea. He uses clonazepam  to manage these episodes.  He is currently taking amlodipine , propranolol , and clonazepam . He is concerned about dependency and side effects from gabapentin  and clonazepam . No recent anxiety attacks but reports significant anxiety and paranoia in the past. No recent suicidal ideations with a plan or intent.         Objective:  Physical Exam: BP 120/80   Pulse 83   Temp 98.4 F (36.9 C) (Temporal)   Ht 5' 11 (1.803 m)   Wt 224 lb 9.6 oz (101.9 kg)   SpO2 95%   BMI 31.33 kg/m   Wt Readings from Last 3 Encounters:  07/08/24 224 lb 9.6 oz (101.9 kg)  07/02/24 228 lb 2.8 oz (103.5 kg)  06/23/24 223 lb (101.2 kg)    Gen: No acute distress, resting comfortably MUSCULOSKELETAL: Trace pitting edema to shins bilaterally. Neuro: Grossly normal, moves all extremities Psych: Normal affect and thought content  Time Spent: 45 minutes of total time was spent on the date of the encounter performing the following actions: chart review prior to seeing the patient including recent visits with other providers and ED visits, obtaining history, performing a medically necessary exam, counseling on the treatment plan, placing orders, and documenting in our EHR.         Worth HERO. Kennyth, MD 07/08/2024 3:30 PM  "

## 2024-07-08 NOTE — Assessment & Plan Note (Signed)
 He is on [facility] Thyroid  per endocrinology.  Currently on 60 mg daily.  No TSH available in her chart though does state he will be seeing them next week to recheck.  We will check TSH today to see if this may be contributing some to the symptoms he has been having.

## 2024-07-08 NOTE — Assessment & Plan Note (Signed)
 Anxiety symptoms have worsened significantly since trying to taper down on the gabapentin .  He does have clonazepam  though as above we are trying to avoid prolonged use of this due to his dependency potential.  We did discuss treatment options to help him especially during the time he is tapering down on the gabapentin .  We will start Seroquel  12.5 to 25 mg twice daily as needed.  Will follow-up with us  in a few days as above.

## 2024-07-08 NOTE — Patient Instructions (Signed)
 It was very nice to see you today!  VISIT SUMMARY: During your visit, we addressed your leg swelling and withdrawal symptoms from gabapentin . We also discussed your anxiety management and made adjustments to your medications to help alleviate your symptoms.  YOUR PLAN: GABAPENTIN  WITHDRAWAL AND ADVERSE EFFECTS: You are experiencing withdrawal symptoms and adverse effects from gabapentin , including anxiety and coordination issues. -Continue tapering off gabapentin  as previously planned. -Take clonidine  three times daily to manage withdrawal symptoms. -Take Seroquel  as needed for anxiety and agitation. -Follow up in a few days to monitor your progress.  BILATERAL LOWER EXTREMITY EDEMA: You have swelling in both legs, possibly related to gabapentin  or amlodipine . -A D-dimer test is ordered to rule out a blood clot. -Reduce the amlodipine  dose by half for one to two weeks. -Increase Lasix  dose from 20 mg to 40 mg to manage the swelling.  ESSENTIAL HYPERTENSION: Your blood pressure is well-controlled. -Reduce the amlodipine  dose by half for one to two weeks to address leg swelling without compromising blood pressure control.  SUBCLINICAL HYPOTHYROIDISM: Your thyroid  function was last checked three months ago. -Thyroid  levels will be checked today to rule out any contribution to leg swelling.  ANXIETY DISORDER: Your anxiety is exacerbated by gabapentin  withdrawal. -Take clonidine  three times daily for anxiety management during withdrawal. -Take Seroquel  as needed for anxiety and agitation.  Return in about 3 years (around 07/09/2027).   Take care, Dr Kennyth  PLEASE NOTE:  If you had any lab tests, please let us  know if you have not heard back within a few days. You may see your results on mychart before we have a chance to review them but we will give you a call once they are reviewed by us .   If we ordered any referrals today, please let us  know if you have not heard from their office  within the next week.   If you had any urgent prescriptions sent in today, please check with the pharmacy within an hour of our visit to make sure the prescription was transmitted appropriately.   Please try these tips to maintain a healthy lifestyle:  Eat at least 3 REAL meals and 1-2 snacks per day.  Aim for no more than 5 hours between eating.  If you eat breakfast, please do so within one hour of getting up.   Each meal should contain half fruits/vegetables, one quarter protein, and one quarter carbs (no bigger than a computer mouse)  Cut down on sweet beverages. This includes juice, soda, and sweet tea.   Drink at least 1 glass of water with each meal and aim for at least 8 glasses per day  Exercise at least 150 minutes every week.

## 2024-07-08 NOTE — Assessment & Plan Note (Signed)
 We are decreasing his amlodipine  to 5 mg daily today due to above leg edema.  He will continue his propranolol  80 mg daily and we are also starting clonidine  0.1 mg 3 times daily as above to help with the legs withdrawal symptoms.  This will also help with his blood pressure as well.  He will follow-up with us  in a few days as above.

## 2024-07-09 ENCOUNTER — Ambulatory Visit: Payer: Self-pay | Admitting: Family Medicine

## 2024-07-09 DIAGNOSIS — D729 Disorder of white blood cells, unspecified: Secondary | ICD-10-CM

## 2024-07-09 DIAGNOSIS — R600 Localized edema: Secondary | ICD-10-CM

## 2024-07-09 DIAGNOSIS — R7989 Other specified abnormal findings of blood chemistry: Secondary | ICD-10-CM

## 2024-07-09 LAB — COMPREHENSIVE METABOLIC PANEL WITH GFR
ALT: 15 U/L (ref 3–53)
AST: 16 U/L (ref 5–37)
Albumin: 4.5 g/dL (ref 3.5–5.2)
Alkaline Phosphatase: 57 U/L (ref 39–117)
BUN: 31 mg/dL — ABNORMAL HIGH (ref 6–23)
CO2: 28 meq/L (ref 19–32)
Calcium: 9.7 mg/dL (ref 8.4–10.5)
Chloride: 101 meq/L (ref 96–112)
Creatinine, Ser: 1.6 mg/dL — ABNORMAL HIGH (ref 0.40–1.50)
GFR: 44.19 mL/min — ABNORMAL LOW
Glucose, Bld: 101 mg/dL — ABNORMAL HIGH (ref 70–99)
Potassium: 3.6 meq/L (ref 3.5–5.1)
Sodium: 139 meq/L (ref 135–145)
Total Bilirubin: 0.9 mg/dL (ref 0.2–1.2)
Total Protein: 7.3 g/dL (ref 6.0–8.3)

## 2024-07-09 LAB — CBC
HCT: 50 % (ref 39.0–52.0)
Hemoglobin: 16.9 g/dL (ref 13.0–17.0)
MCHC: 33.9 g/dL (ref 30.0–36.0)
MCV: 92.5 fl (ref 78.0–100.0)
Platelets: 154 K/uL (ref 150.0–400.0)
RBC: 5.41 Mil/uL (ref 4.22–5.81)
RDW: 14.6 % (ref 11.5–15.5)
WBC: 10.7 K/uL — ABNORMAL HIGH (ref 4.0–10.5)

## 2024-07-09 LAB — D-DIMER, QUANTITATIVE: D-Dimer, Quant: 0.9 ug{FEU}/mL — ABNORMAL HIGH

## 2024-07-09 LAB — TSH: TSH: 3.61 u[IU]/mL (ref 0.35–5.50)

## 2024-07-09 LAB — MAGNESIUM: Magnesium: 2.3 mg/dL (ref 1.5–2.5)

## 2024-07-09 NOTE — Progress Notes (Signed)
 His D dimer was mildly elevated. Recommend we send him for a STAT lower extremity doppler to rule out blood clot. Please place order/referral if he is agreeable.

## 2024-07-10 ENCOUNTER — Ambulatory Visit (HOSPITAL_COMMUNITY)
Admission: RE | Admit: 2024-07-10 | Discharge: 2024-07-10 | Disposition: A | Discharge status: Home / Self Care | Source: Ambulatory Visit | Attending: Family Medicine | Admitting: Family Medicine

## 2024-07-10 ENCOUNTER — Ambulatory Visit: Admitting: Family Medicine

## 2024-07-10 DIAGNOSIS — R600 Localized edema: Secondary | ICD-10-CM | POA: Insufficient documentation

## 2024-07-10 DIAGNOSIS — R7989 Other specified abnormal findings of blood chemistry: Secondary | ICD-10-CM | POA: Insufficient documentation

## 2024-07-10 NOTE — Addendum Note (Signed)
 Addended by: IDA ELORA HERO on: 07/10/2024 04:11 PM   Modules accepted: Orders

## 2024-07-10 NOTE — Progress Notes (Signed)
 White blood cell count was mildly elevated.  This generally indicates inflammation though may be a sign of infection.  We can recheck this either tomorrow or next week.  Labs also do indicate he was dehydrated.  This is probably contributing some to his symptoms as well.  Recommend he continue to push fluids.  We can also recheck this tomorrow or next week.

## 2024-07-11 ENCOUNTER — Encounter: Payer: Self-pay | Admitting: Family Medicine

## 2024-07-11 ENCOUNTER — Ambulatory Visit: Payer: Self-pay | Admitting: Family Medicine

## 2024-07-11 ENCOUNTER — Ambulatory Visit: Admitting: Family Medicine

## 2024-07-11 VITALS — BP 130/80 | HR 65 | Temp 97.5°F | Ht 71.0 in | Wt 222.4 lb

## 2024-07-11 DIAGNOSIS — I10 Essential (primary) hypertension: Secondary | ICD-10-CM

## 2024-07-11 DIAGNOSIS — F419 Anxiety disorder, unspecified: Secondary | ICD-10-CM

## 2024-07-11 DIAGNOSIS — M5417 Radiculopathy, lumbosacral region: Secondary | ICD-10-CM

## 2024-07-11 NOTE — Assessment & Plan Note (Signed)
 Anxiety symptoms are much better controlled today.  He is currently on clonidine  1 mg 3 times daily which we started a few days ago.  Has also used the Seroquel  a couple times daily since our last visit as well.  He does note that he feels much more calm though is having some grogginess and dry mouth.  Overall the symptoms are manageable.  We did discuss trial of sugar-free hard candies to help with the dry mouth and also recommend he increase his fluid intake.  Anticipate the dry mouth will improve as he continues to taper down on the gabapentin .  Given that his anxiety is much better controlled we will continue with his current dose of clonidine  and Seroquel  for now and he will follow-up with me next week.

## 2024-07-11 NOTE — Progress Notes (Signed)
 Ultrasound negative for DVT.  We discussed this during his office visit this morning.

## 2024-07-11 NOTE — Progress Notes (Signed)
 "  Geoffrey West is a 69 y.o. male who presents today for an office visit.  Assessment/Plan:    Chronic Problems Addressed Today: Anxiety Anxiety symptoms are much better controlled today.  He is currently on clonidine  1 mg 3 times daily which we started a few days ago.  Has also used the Seroquel  a couple times daily since our last visit as well.  He does note that he feels much more calm though is having some grogginess and dry mouth.  Overall the symptoms are manageable.  We did discuss trial of sugar-free hard candies to help with the dry mouth and also recommend he increase his fluid intake.  Anticipate the dry mouth will improve as he continues to taper down on the gabapentin .  Given that his anxiety is much better controlled we will continue with his current dose of clonidine  and Seroquel  for now and he will follow-up with me next week.  Essential hypertension Blood pressure at goal today on amlodipine  5 mg daily.  Leg swelling is improving as well.  He is also on the clonidine  0.1 mg 3 times daily as above.  Will follow-up with me here next week.  Radiculopathy, lumbosacral region He is weaning down on his gabapentin  and is now on 500 mg total daily.  He will be switching to 100 mg 3 times daily in 3 days.  He is having significant issues with withdrawal that we are managing with the clonidine  and Seroquel .  He does have some side effects with this as above however this is currently manageable.  Will continue his clonidine  0.1 mg 3 times daily and current dose of Seroquel  as above as he continues to be now on the gabapentin .  He will follow-up with us  next week.  We discussed reasons to return to care sooner.  We are planning on seeing him weekly until his withdrawal symptoms resolve.     Subjective:  HPI:  See assessment / plan for status of chronic conditions.  We saw him 3 days ago and had extensive discussion regarding his withdrawal symptoms from gabapentin .  At that time we  started him on clonidine  and Seroquel  to help with his heightened anxiety and withdrawal symptoms as he continued his gradual gabapentin  taper.  Overall he feels like his anxiety symptoms are much better controlled today though does have some increased drowsiness and grogginess since starting the clonidine  and Seroquel .   Discussed the use of AI scribe software for clinical note transcription with the patient, who gave verbal consent to proceed.  History of Present Illness Geoffrey West is a 69 year old male who presents for follow-up on medication adjustments and symptom management.  He experiences significant drowsiness and grogginess. He attributes these symptoms to clonidine , which he takes three times a day, and gabapentin , which he is currently tapering. His current gabapentin  regimen is 200 mg in the morning, 100 mg in the afternoon, and 100 mg at night.  He has significant dry mouth, which he finds 'ridiculous' and makes it hard to talk. He believes this may be related to clonidine , as he found dry mouth listed as a side effect when he researched it.  He has been using Seroquel , taking half a pill in the morning and at night, which has helped with agitation and anxiety. He does not report any intolerable side effects from the current medication regimen, aside from the drowsiness and dry mouth.  He notes some improvement in leg swelling, which he associates  with gabapentin  and amlodipine . He is confident that gabapentin  is contributing to his symptoms and is determined not to use it again once the taper is complete.  He has been sleeping a lot more, which he prefers over his previous symptoms. Despite the drowsiness, he is considering engaging in light physical activity at the [facility] when he feels less groggy.         Objective:  Physical Exam: BP 130/80   Pulse 65   Temp (!) 97.5 F (36.4 C) (Temporal)   Ht 5' 11 (1.803 m)   Wt 222 lb 6.4 oz (100.9 kg)   SpO2 98%    BMI 31.02 kg/m   Gen: No acute distress, resting comfortably CV: Regular rate and rhythm with no murmurs appreciated Pulm: Normal work of breathing, clear to auscultation bilaterally with no crackles, wheezes, or rhonchi Neuro: Grossly normal, moves all extremities Psych: Normal affect and thought content      [name], MD 07/11/2024 8:21 AM  "

## 2024-07-11 NOTE — Patient Instructions (Signed)
 It was very nice to see you today!  VISIT SUMMARY: Today, we discussed your medication adjustments and symptom management. You reported feeling drowsy and experiencing dry mouth, which we believe are side effects of your current medications. We also noted some improvement in your leg swelling.  YOUR PLAN: ANXIETY DISORDER: You are experiencing increased drowsiness and dry mouth, likely due to clonidine  and gabapentin . Your anxiety and agitation are not significant at this time. -Continue taking clonidine  and Seroquel  at your current doses. -Gradually taper gabapentin  to reduce drowsiness and grogginess.  MEDICATION MANAGEMENT AND ADVERSE EFFECTS: You are experiencing dry mouth and drowsiness, likely from clonidine  and gabapentin . -Continue tapering gabapentin , reducing to 100 mg three times daily next week. -Use sugarless hard candies to help with dry mouth. -Increase your fluid intake to address dehydration.  PERIPHERAL EDEMA: You have noted mild improvement in your leg swelling, likely related to amlodipine  and gabapentin . -Continue your current management and monitor progress.  Return in about 1 week (around 07/18/2024) for Follow Up.   Take care, Dr Kennyth  PLEASE NOTE:  If you had any lab tests, please let us  know if you have not heard back within a few days. You may see your results on mychart before we have a chance to review them but we will give you a call once they are reviewed by us .   If we ordered any referrals today, please let us  know if you have not heard from their office within the next week.   If you had any urgent prescriptions sent in today, please check with the pharmacy within an hour of our visit to make sure the prescription was transmitted appropriately.   Please try these tips to maintain a healthy lifestyle:  Eat at least 3 REAL meals and 1-2 snacks per day.  Aim for no more than 5 hours between eating.  If you eat breakfast, please do so within one hour of  getting up.   Each meal should contain half fruits/vegetables, one quarter protein, and one quarter carbs (no bigger than a computer mouse)  Cut down on sweet beverages. This includes juice, soda, and sweet tea.   Drink at least 1 glass of water with each meal and aim for at least 8 glasses per day  Exercise at least 150 minutes every week.

## 2024-07-11 NOTE — Assessment & Plan Note (Signed)
 Blood pressure at goal today on amlodipine  5 mg daily.  Leg swelling is improving as well.  He is also on the clonidine  0.1 mg 3 times daily as above.  Will follow-up with me here next week.

## 2024-07-11 NOTE — Assessment & Plan Note (Signed)
 He is weaning down on his gabapentin  and is now on 500 mg total daily.  He will be switching to 100 mg 3 times daily in 3 days.  He is having significant issues with withdrawal that we are managing with the clonidine  and Seroquel .  He does have some side effects with this as above however this is currently manageable.  Will continue his clonidine  0.1 mg 3 times daily and current dose of Seroquel  as above as he continues to be now on the gabapentin .  He will follow-up with us  next week.  We discussed reasons to return to care sooner.  We are planning on seeing him weekly until his withdrawal symptoms resolve.

## 2024-07-17 ENCOUNTER — Ambulatory Visit (HOSPITAL_COMMUNITY)
Admission: EM | Admit: 2024-07-17 | Discharge: 2024-07-17 | Disposition: A | Discharge status: Home / Self Care | Attending: Psychiatry | Admitting: Psychiatry

## 2024-07-17 DIAGNOSIS — I10 Essential (primary) hypertension: Secondary | ICD-10-CM | POA: Diagnosis not present

## 2024-07-17 DIAGNOSIS — G8929 Other chronic pain: Secondary | ICD-10-CM | POA: Insufficient documentation

## 2024-07-17 DIAGNOSIS — F19939 Other psychoactive substance use, unspecified with withdrawal, unspecified: Secondary | ICD-10-CM

## 2024-07-17 DIAGNOSIS — K219 Gastro-esophageal reflux disease without esophagitis: Secondary | ICD-10-CM | POA: Insufficient documentation

## 2024-07-17 DIAGNOSIS — R451 Restlessness and agitation: Secondary | ICD-10-CM | POA: Diagnosis not present

## 2024-07-17 DIAGNOSIS — F4323 Adjustment disorder with mixed anxiety and depressed mood: Secondary | ICD-10-CM | POA: Diagnosis present

## 2024-07-17 DIAGNOSIS — E039 Hypothyroidism, unspecified: Secondary | ICD-10-CM | POA: Diagnosis not present

## 2024-07-17 DIAGNOSIS — M549 Dorsalgia, unspecified: Secondary | ICD-10-CM | POA: Insufficient documentation

## 2024-07-17 DIAGNOSIS — Z79899 Other long term (current) drug therapy: Secondary | ICD-10-CM | POA: Diagnosis not present

## 2024-07-17 MED ORDER — HYDROXYZINE HCL 25 MG PO TABS: 25.0000 mg | ORAL_TABLET | Freq: Three times a day (TID) | ORAL | Status: DC | PRN

## 2024-07-17 NOTE — Progress Notes (Signed)
" °   07/17/24 1314  [facility] Triage Screening (Walk-ins at [facility] only)  What Is the Reason for Your Visit/Call Today? Geoffrey West 33b male presents to [facility] accompanied by his niece. PT is here today for help w/ severe withdrawals from gabapentin . PT has hx of anxiety, seroquel , clonidine  are prescribed medications. PT denies SI, HI, AVH and alcohol  and substance use.  How Long Has This Been Causing You Problems? 1-6 months  Have You Recently Had Any Thoughts About Hurting Yourself? No  Are You Planning to Commit Suicide/Harm Yourself At This time? No  Have you Recently Had Thoughts About Hurting Someone [name]? No  Are You Planning To Harm Someone At This Time? No  Physical Abuse Denies  Verbal Abuse Denies  Sexual Abuse Denies  Exploitation of patient/patient's resources Denies  Self-Neglect Denies  Are you currently experiencing any auditory, visual or other hallucinations? No  Have You Used Any Alcohol  or Drugs in the Past 24 Hours? No  Do you have any current medical co-morbidities that require immediate attention?  (hypertension)  Clinician description of patient physical appearance/behavior: calm, cooperative, normal - but seems fatigued  What Do You Feel Would Help You the Most Today? Medication(s) (Help w/ gabapentin  withdrawals)  Determination of Need Routine (7 days)  Options For Referral Medication Management    "

## 2024-07-17 NOTE — ED Provider Notes (Signed)
 Behavioral Health Urgent Care Medical Screening Exam  Patient Name: Geoffrey West MRN: [id number] Date of Evaluation: 07/17/24 Chief Complaint: Gabapentin  withdrawal with increased anxiety and depressed mood   Diagnosis:  Final diagnoses:  Withdrawal from other psychoactive substance (HCC)  Adjustment disorder with mixed anxiety and depressed mood    History of Present illness: Geoffrey West, 69 y.o., male  presented to [facility] voluntarily as a walk in, accompanied by his niece, with complaints of worsening anxiety and withdrawal symptoms from tapering off of gabapentin . Patient seen face to face by this provider, and chart reviewed on 07/17/24.  Per chart review patient has a past psychiatric history of anxiety and insomnia.  Medical history pertinent for chronic pain, GERD, hypothyroidism and hypertension.  Patient reports that he is currently working with his PCP to taper off of his gabapentin  and has been experiencing worsening withdrawal symptoms including panic attacks, restlessness, anxiety and feeling cold. Upon chart review patient was noted to be initially prescribed gabapentin  600 mg twice daily and gabapentin  300 mg at lunchtime.  Patient is currently prescribed gabapentin  100 mg 3 times daily.  He was prescribed Seroquel  25mg  BID PRN and Clonidine  0.1mg  3 times daily to help with withdrawal symptoms.  No current mental health providers.  On evaluation Geoffrey West reports that he has been struggling with the discontinuation of Gabapentin  for about 5 weeks. He reports starting Gabapentin  for back pain but began to experience negative side effects including lower extremity edema, feeling overly sedated, and weight gain attributing to chronic pain. Pt states that since trying to stop Gabapentin , he has had a few ED visits due to his overwhelming withdrawal systems. Pt reports that he came in today after going to the chiropractor and having a  panic attack afterwards due to worsening of pain, restlessness and anxiety, that he feels was related to Gabapentin  withdrawal.  He reports really wanting to just feel better or have hope pf feeling better because it is so hard living like this. Pt does become tearful during assessment.He denies SI/HI and AVH. He reports poor sleep and decreased appetite.   We discussed options including a short detox withdrawal admission, however concluded that this may not be beneficial to pt as he is already taking recommended supportive medications (Seroquel  and Clonidine ) to help withdrawal symptoms.   This case was discussed with attending psychiatrist, Dr. Lawrnce for further options/ recommendations. Discussed the possibility of pt withdrawing from both Gabapentin  and Clonazepam , which pt reports he stopped taking Clonazepam  a week ago. Also educated about metabolizing medications differently and that he may require a longer tapering process to allow withdrawal symptoms to improve prior to decreasing dose. Pt has been nervous to take multiple different medications due to side effects or addictive properties. After educating pt on medications, he came up with a medication plan that included taking his scheduled medications along with the following prescribed PRNs: one Hydroxyzine  tablet in the morning and afternoon then taking one Seroquel  tablet at bedtime to help improve sleep and anxiety symptoms. Pt will discuss options with his PCP at appointment tmrw 07/18/24.    During evaluation Geoffrey West is sitting up in assessment room, in no acute distress.  He is alert/oriented x 4; calm/cooperative; and mood congruent with affect.  He is speaking in a clear tone at moderate volume, and normal pace; with good eye contact.  His thought process is coherent and relevant; There is no objective indication that he  is currently responding to internal/external stimuli or experiencing delusional thought content. He/She has  denied suicidal ideation,self-harm, homicidal ideation, auditory hallucinations, visual hallucinations and/or paranoia. Patient has remained calm throughout assessment and has answered questions appropriately.      Flowsheet Row ED from 07/17/2024 in [facility] ED from 07/02/2024 in [facility] Emergency Department at [facility] ED from 06/17/2024 in [facility] Emergency Department at [facility]  C-SSRS RISK CATEGORY Low Risk No Risk Low Risk    Psychiatric Specialty Exam  Presentation  General Appearance:Appropriate for Environment; Well Groomed  Eye Contact:Good  Speech:Clear and Coherent  Speech Volume:Normal  Handedness:Right   Mood and Affect  Mood: Anxious  Affect: Appropriate   Thought Process  Thought Processes: Coherent; Linear  Descriptions of Associations:Intact  Orientation:Full (Time, Place and Person)  Thought Content:WDL    Hallucinations:None  Ideas of Reference:None  Suicidal Thoughts:No  Homicidal Thoughts:No   Sensorium  Memory: Immediate Good; Recent Good  Judgment: Good  Insight: Good   Executive Functions  Concentration: Good  Attention Span: Good  Recall: Good  Fund of Knowledge: Good  Language: Good   Psychomotor Activity  Psychomotor Activity: Restlessness   Assets  Assets: Desire for Improvement; Housing; Social Support; Vocational/Educational; Manufacturing Systems Engineer; Financial Resources/Insurance; Resilience; Transportation   Sleep  Sleep: Fair  Number of hours:  5   Physical Exam: Physical Exam Vitals and nursing note reviewed.  Constitutional:      Appearance: Normal appearance.  HENT:     Head: Normocephalic.     Nose: Nose normal.  Eyes:     Extraocular Movements: Extraocular movements intact.  Cardiovascular:     Rate and Rhythm: Normal rate.  Pulmonary:     Effort: Pulmonary effort is normal.  Musculoskeletal:        General: Normal  range of motion.     Cervical back: Normal range of motion.  Neurological:     General: No focal deficit present.     Mental Status: He is alert and oriented to person, place, and time.  Psychiatric:        Behavior: Behavior normal.        Thought Content: Thought content normal.    Review of Systems  Constitutional: Negative.   HENT: Negative.    Eyes: Negative.   Respiratory: Negative.    Cardiovascular: Negative.   Gastrointestinal: Negative.   Genitourinary: Negative.   Musculoskeletal:  Positive for back pain, joint pain and myalgias.  Neurological: Negative.   Endo/Heme/Allergies: Negative.   Psychiatric/Behavioral:  Positive for depression. The patient is nervous/anxious.    Blood pressure (!) 130/97, pulse 75, temperature 98.8 F (37.1 C), temperature source Temporal, resp. rate 17, SpO2 97%. There is no height or weight on file to calculate BMI.  Musculoskeletal: Strength & Muscle Tone: within normal limits Gait & Station: normal Patient leans: N/A   [facility] MSE Discharge Disposition for Follow up and Recommendations: Based on my evaluation the patient does not appear to have an emergency medical condition and can be discharged with resources and follow up care in outpatient services for Medication Management and Individual Therapy  Pt is discharged from [facility] with outpatient psychiatric/ substance detox treatment resources. Discussed different options for treatment including taking his prescribed medications, including the PRNs to help decrease withdrawal symptoms. Reminded pt that withdrawal processes can take time and are difficult but encouraged to continue. Advised pt against abruptly stopping Gabapentin  as it lead to seizure activity. Pt will discuss the option  of slowing down his Gabapentin  taper with his PCP.   - Pt has a follow up appointment with PCP at [facility] tomorrow 07/18/24  at 0740.  - Pt is also encouraged to follow up with outpatient psychiatry  to help gain coping strategies for anxiety and improve mood through therapy.  - Pt reports having medications at home, denied need for scripts.  - Pt encouraged to return to [facility] or nearest ED, if depressive symptoms worsen or lead to suicidal ideations. Pt verbalized understanding.   Alan JAYSON Mcardle, NP 07/17/2024, 4:06 PM

## 2024-07-17 NOTE — Progress Notes (Signed)
" °   07/17/24 1314  [facility] Triage Screening (Walk-ins at [facility] only)  What Is the Reason for Your Visit/Call Today? Geoffrey West 33b male presents to [facility] accompanied by his niece. PT is here today for help w/ severe withdrawals from gabapentin . PT has hx of anxiety, seroquel , clonidine  are prescribed medications. PT denies SI, HI, AVH and alcohol  and substance use.  How Long Has This Been Causing You Problems? 1-6 months  Have You Recently Had Any Thoughts About Hurting Yourself? No  Are You Planning to Commit Suicide/Harm Yourself At This time? No  Have you Recently Had Thoughts About Hurting Someone Geoffrey West? No  Are You Planning To Harm Someone At This Time? No  Physical Abuse Denies  Verbal Abuse Denies  Sexual Abuse Denies  Exploitation of patient/patient's resources Denies  Self-Neglect Denies  Are you currently experiencing any auditory, visual or other hallucinations? No  Have You Used Any Alcohol  or Drugs in the Past 24 Hours? No  Do you have any current medical co-morbidities that require immediate attention?  (hypertension)  Clinician description of patient physical appearance/behavior: calm, cooperative, normal - but seems fatigued  What Do You Feel Would Help You the Most Today? Medication(s) (Help w/ gabapentin  withdrawals)  Determination of Need Routine (7 days)  Options For Referral Medication Management    "

## 2024-07-17 NOTE — Discharge Instructions (Addendum)
 Discharge recommendations:   Medications: To help with anxiety/ restlessness and panic, you may take the Hydroxyzine  25mg  as discussed one in the morning and one in the afternoon. You may then take Quetiapine  25mg  at bedtime to help with sleep and anxiety. Please discuss with your doctor, possibly slowing down the Gabapentin  taper due to worsening withdrawal symptoms. Patient is to take medications as prescribed. The patient or patient's guardian is to contact a medical professional and/or outpatient provider to address any new side effects that develop. The patient or the patient's guardian should update outpatient providers of any new medications and/or medication changes.    Outpatient Follow up: Please follow up with PCP appointment tomorrow 07/18/24. Please review list of outpatient resources for psychiatry and counseling. Please follow up with your primary care provider for all medical related needs.    Therapy: We recommend that patient participate in individual therapy to address mental health concerns.   Safety:   The following safety precautions should be taken:   No sharp objects. This includes scissors, razors, scrapers, and putty knives.   Chemicals should be removed and locked up.   Medications should be removed and locked up.   Weapons should be removed and locked up. This includes firearms, knives and instruments that can be used to cause injury.   The patient should abstain from use of illicit substances/drugs and abuse of any medications.  If symptoms worsen or do not continue to improve or if the patient becomes actively suicidal or homicidal then it is recommended that the patient return to the closest hospital emergency department, the [facility], or call 911 for further evaluation and treatment. National Suicide Prevention Lifeline 1-800-SUICIDE or 445-292-0383.  About 988 988 offers 24/7 access to trained crisis counselors who can  help people experiencing mental health-related distress. People can call or text 988 or chat 988lifeline.org for themselves or if they are worried about a loved one who may need crisis support.

## 2024-07-18 ENCOUNTER — Ambulatory Visit (INDEPENDENT_AMBULATORY_CARE_PROVIDER_SITE_OTHER): Admitting: Family Medicine

## 2024-07-18 ENCOUNTER — Encounter: Payer: Self-pay | Admitting: Family Medicine

## 2024-07-18 VITALS — BP 138/88 | HR 81 | Temp 97.2°F | Ht 71.0 in | Wt 224.8 lb

## 2024-07-18 DIAGNOSIS — I10 Essential (primary) hypertension: Secondary | ICD-10-CM | POA: Diagnosis not present

## 2024-07-18 DIAGNOSIS — F419 Anxiety disorder, unspecified: Secondary | ICD-10-CM

## 2024-07-18 DIAGNOSIS — M5417 Radiculopathy, lumbosacral region: Secondary | ICD-10-CM

## 2024-07-18 DIAGNOSIS — M1000 Idiopathic gout, unspecified site: Secondary | ICD-10-CM

## 2024-07-18 MED ORDER — CLONIDINE HCL 0.1 MG PO TABS: 0.1000 mg | ORAL_TABLET | Freq: Three times a day (TID) | ORAL | 3 refills | Status: AC

## 2024-07-18 MED ORDER — AMLODIPINE BESYLATE 5 MG PO TABS: 5.0000 mg | ORAL_TABLET | Freq: Every day | ORAL | 3 refills | Status: DC

## 2024-07-18 MED ORDER — GABAPENTIN 100 MG PO CAPS: 100.0000 mg | ORAL_CAPSULE | Freq: Two times a day (BID) | ORAL | 0 refills | Status: AC

## 2024-07-18 MED ORDER — HYDROXYZINE HCL 25 MG PO TABS: 25.0000 mg | ORAL_TABLET | Freq: Three times a day (TID) | ORAL | 3 refills | Status: AC | PRN

## 2024-07-18 MED ORDER — FLUTICASONE PROPIONATE 50 MCG/ACT NA SUSP: 2.0000 | Freq: Every day | NASAL | 0 refills | Status: AC

## 2024-07-18 MED ORDER — CELECOXIB 200 MG PO CAPS: 200.0000 mg | ORAL_CAPSULE | Freq: Two times a day (BID) | ORAL | 0 refills | Status: DC

## 2024-07-18 MED ORDER — FUROSEMIDE 40 MG PO TABS: 40.0000 mg | ORAL_TABLET | Freq: Two times a day (BID) | ORAL | 3 refills | Status: DC

## 2024-07-18 NOTE — Assessment & Plan Note (Signed)
 He had a panic attack yesterday and significant flareup of anxiety symptoms related to his gabapentin  withdrawal.  We are tapering his behavioral urgent care yesterday and they were as below.  He did see commended taking hydroxyzine  in the morning and in the afternoon and Seroquel  at night.  Discussed with patient this would be a reasonable strategy.  Will also increase his clonidine  0.2 mg in the morning and continue 0.1 mg in the afternoon and at night.  He will follow-up with me in 1 week.  He is currently scheduled to decrease dose of gabapentin  in about 3 to 4 days.  He is apprehensive about this.  Discussed with patient if he has not good next few days he can go to 200 mg total daily however if he is still having issues he can wait until his next appointment with me next week to discuss further tapering down.

## 2024-07-18 NOTE — Assessment & Plan Note (Addendum)
 Initially elevated today but at goal on recheck.  We are continuing his amlodipine  5 mg daily, propranolol  80 mg daily as above.  Increasing his clonidine  to 0.2 mg in the morning, 0.1 mg in the afternoon and 0.1 mg at night.  He will follow-up with me in a week or so.  May consider switching his amlodipine  to losartan or irbesartan if continues to have issues with lower extremity swelling.

## 2024-07-18 NOTE — Assessment & Plan Note (Signed)
 Patient is weaning down on gabapentin .  He is now currently on 100 mg 3 times daily.  He did have significant flareup of anxiety yesterday which he attributes to gabapentin  withdrawal but symptoms are better controlled today.  We are managing this as above with the hydroxyzine , Seroquel , and clonidine .  He will be weaning down to 100 mg twice daily on gabapentin  in the next few days though I discussed with patient he can wait till his appoint with them next week before going through with this.

## 2024-07-18 NOTE — Progress Notes (Signed)
 "  Geoffrey West is a 69 y.o. male who presents today for an office visit.  Assessment/Plan:  New/Acute Problems: Leg Edema  Patient with 1+ pitting edema in bilateral lower extremities left worse than right.  He has had some improvement with decreasing his amlodipine  though he is concerned that this may be due to gabapentin  withdrawal.  We did discuss conservative strategies including leg elevation and salt avoidance.  He has had some success with Lasix .  Will increase to twice daily.  Follow-up with me again next week.  If continues to have significant issues with this would consider switching his amlodipine  to losartan.  Chronic Problems Addressed Today: Anxiety He had a panic attack yesterday and significant flareup of anxiety symptoms related to his gabapentin  withdrawal.  We are tapering his behavioral urgent care yesterday and they were as below.  He did see commended taking hydroxyzine  in the morning and in the afternoon and Seroquel  at night.  Discussed with patient this would be a reasonable strategy.  Will also increase his clonidine  0.2 mg in the morning and continue 0.1 mg in the afternoon and at night.  He will follow-up with me in 1 week.  He is currently scheduled to decrease dose of gabapentin  in about 3 to 4 days.  He is apprehensive about this.  Discussed with patient if he has not good next few days he can go to 200 mg total daily however if he is still having issues he can wait until his next appointment with me next week to discuss further tapering down.  Radiculopathy, lumbosacral region Patient is weaning down on gabapentin .  He is now currently on 100 mg 3 times daily.  He did have significant flareup of anxiety yesterday which he attributes to gabapentin  withdrawal but symptoms are better controlled today.  We are managing this as above with the hydroxyzine , Seroquel , and clonidine .  He will be weaning down to 100 mg twice daily on gabapentin  in the next few days though I  discussed with patient he can wait till his appoint with them next week before going through with this.  Essential hypertension Initially elevated today but at goal on recheck.  We are continuing his amlodipine  5 mg daily, propranolol  80 mg daily as above.  Increasing his clonidine  to 0.2 mg in the morning, 0.1 mg in the afternoon and 0.1 mg at night.  He will follow-up with me in a week or so.  May consider switching his amlodipine  to losartan or irbesartan if continues to have issues with lower extremity swelling.     Subjective:  HPI:  See assessment / plan for status of chronic conditions.  Patient is here today for follow-up.  I saw him 1 week ago to discuss his uncontrolled anxiety as well as withdrawal symptoms from gabapentin .  Her most recent visit he was on 500 mg total daily of gabapentin .  He was also on clonidine  1 mg 3 times daily for his anxiety and withdrawal symptoms.  He also had a prescription for Seroquel  which did seem to be working as well.  He decreased his gabapentin  100 mg 3 times daily a few days ago.  He went to the chiropractor and started having a panic attack while there including worsening pain, right leg stiffness, anxiety that he thought was due to the gabapentin .  They recommended he take 1 hydroxyzine  tablet in the morning and afternoon and take 1 Seroquel  tablet at bedtime to help with his sleep and anxiety they  did discuss inpatient detox however elected against this as they do not think that this would be beneficial.  Discussed the use of AI scribe software for clinical note transcription with the patient, who gave verbal consent to proceed.  History of Present Illness Geoffrey West is a 69 year old male with anxiety and gabapentin  withdrawal who presents with uncontrolled anxiety and withdrawal symptoms.  He is experiencing uncontrolled anxiety and withdrawal symptoms from gabapentin . Previously on 500 mg total daily, he has decreased his dose to 100  mg three times daily. He experienced a panic attack at the chiropractor, with symptoms including worsening pain, restlessness, and anxiety, which he attributes to gabapentin  withdrawal. He is currently taking clonidine  1 mg three times daily for anxiety and withdrawal symptoms, and Seroquel , which he finds effective for sleep and anxiety. Hydroxyzine  has been advised twice daily, but he finds it less effective than Seroquel , which 'knocks me out.'  He describes experiencing agitation, feeling cold, and being unable to get warm, which he associates with withdrawal symptoms. He also reports swollen feet, which he attributes to gabapentin  side effects, and mentions that his feet hurt so much that it affects his sleep. He is currently taking Lasix . He has a history of gout, which he describes as less painful than his current symptoms, but notes that his big toe is particularly painful. He is taking Celebrex  for pain management. The swelling in his feet has been a persistent issue, first noticed as a side effect of gabapentin .  His blood pressure was 150/90 today and 130/90 yesterday at home. He is currently on amlodipine  and propranolol  for blood pressure management. He expresses concern about the potential for amlodipine  to cause swelling, as suggested by a pharmacist friend. His anxiety tends to flare up in the late morning to early afternoon, with increased pain and discomfort during these times. He is on a tapering schedule for gabapentin .         Objective:  Physical Exam: BP 138/88   Pulse 81   Temp (!) 97.2 F (36.2 C) (Temporal)   Ht 5' 11 (1.803 m)   Wt 224 lb 12.8 oz (102 kg)   SpO2 98%   BMI 31.35 kg/m   Gen: No acute distress, resting comfortably CV: Regular rate and rhythm with no murmurs appreciated Pulm: Normal work of breathing, clear to auscultation bilaterally with no crackles, wheezes, or rhonchi MUSCULOSKELETAL: - Legs: Bilateral feet with trace pretibial edema bilaterally.   Bunion noted left great toe.  Mild tenderness to palpation.  No erythema.  No effusion noted. Neuro: Grossly normal, moves all extremities Psych: Normal affect and thought content  Time Spent: 45 minutes of total time was spent on the date of the encounter performing the following actions: chart review prior to seeing the patient, obtaining history, performing a medically necessary exam, counseling on the treatment plan, placing orders, and documenting in our EHR.        Worth HERO. Kennyth, MD 07/18/2024 8:34 AM  "

## 2024-07-18 NOTE — Patient Instructions (Signed)
 It was very nice to see you today!  VISIT SUMMARY: Today, we discussed your anxiety and withdrawal symptoms from gabapentin , as well as your gout and high blood pressure. We adjusted your medications to help manage these issues.  YOUR PLAN: ANXIETY DISORDER WITH GABAPENTIN  WITHDRAWAL: You are experiencing anxiety and withdrawal symptoms due to reducing your gabapentin  dose. -Reduce gabapentin  to 300 mg daily. -Continue taking hydroxyzine  twice daily and Seroquel  at night. -Increase clonidine  to 0.2 mg in the morning, and maintain the current dose in the afternoon and at night. -Monitor your anxiety symptoms and adjust gabapentin  tapering based on symptom control. -Hydroxyzine  and clonidine  prescriptions have been refilled.  IDIOPATHIC GOUT WITH PERIPHERAL EDEMA: You have swelling and pain in your feet, which suggests gout. -Increase Lasix  to twice daily, morning and midday. -Continue taking Celebrex  for pain management. -Monitor for signs of a gout flare and consider prednisone  if symptoms worsen.  ESSENTIAL HYPERTENSION: Your blood pressure is elevated. -Continue taking amlodipine . -Increase clonidine  to 0.2 mg in the morning, maintaining the current dose in the afternoon and at night. -Monitor your blood pressure and adjust treatment as needed.  Return in about 1 week (around 07/25/2024) for Follow Up.   Take care, Dr Kennyth  PLEASE NOTE:  If you had any lab tests, please let us  know if you have not heard back within a few days. You may see your results on mychart before we have a chance to review them but we will give you a call once they are reviewed by us .   If we ordered any referrals today, please let us  know if you have not heard from their office within the next week.   If you had any urgent prescriptions sent in today, please check with the pharmacy within an hour of our visit to make sure the prescription was transmitted appropriately.   Please try these tips to maintain  a healthy lifestyle:  Eat at least 3 REAL meals and 1-2 snacks per day.  Aim for no more than 5 hours between eating.  If you eat breakfast, please do so within one hour of getting up.   Each meal should contain half fruits/vegetables, one quarter protein, and one quarter carbs (no bigger than a computer mouse)  Cut down on sweet beverages. This includes juice, soda, and sweet tea.   Drink at least 1 glass of water with each meal and aim for at least 8 glasses per day  Exercise at least 150 minutes every week.

## 2024-07-25 ENCOUNTER — Encounter: Payer: Self-pay | Admitting: Family Medicine

## 2024-07-25 ENCOUNTER — Ambulatory Visit (INDEPENDENT_AMBULATORY_CARE_PROVIDER_SITE_OTHER): Admitting: Family Medicine

## 2024-07-25 VITALS — BP 126/86 | HR 60 | Temp 98.1°F | Ht 71.0 in | Wt 223.6 lb

## 2024-07-25 DIAGNOSIS — M2550 Pain in unspecified joint: Secondary | ICD-10-CM

## 2024-07-25 DIAGNOSIS — M5417 Radiculopathy, lumbosacral region: Secondary | ICD-10-CM

## 2024-07-25 DIAGNOSIS — I10 Essential (primary) hypertension: Secondary | ICD-10-CM | POA: Diagnosis not present

## 2024-07-25 DIAGNOSIS — F419 Anxiety disorder, unspecified: Secondary | ICD-10-CM | POA: Diagnosis not present

## 2024-07-25 MED ORDER — QUETIAPINE FUMARATE 25 MG PO TABS: 12.5000 mg | ORAL_TABLET | Freq: Two times a day (BID) | ORAL | 0 refills | Status: DC | PRN

## 2024-07-25 MED ORDER — LOSARTAN POTASSIUM 50 MG PO TABS: 50.0000 mg | ORAL_TABLET | Freq: Every day | ORAL | 3 refills | Status: AC

## 2024-07-25 MED ORDER — FUROSEMIDE 40 MG PO TABS: 40.0000 mg | ORAL_TABLET | Freq: Two times a day (BID) | ORAL | 3 refills | Status: AC

## 2024-07-25 MED ORDER — CELECOXIB 200 MG PO CAPS: 200.0000 mg | ORAL_CAPSULE | Freq: Two times a day (BID) | ORAL | 0 refills | Status: AC

## 2024-07-25 NOTE — Assessment & Plan Note (Signed)
 Anxiety much better controlled today.  This is mostly related to his gabapentin  withdrawal over the last few weeks.  He is currently taking hydroxyzine  25 mg in the morning in the afternoon and Seroquel  25 mg at night.  Also taking clonidine  0.2 mg in the morning and with 0.1 mg in the afternoon at night.  He will continue current regimen for now until he is able to wean off the gabapentin  fully.  Follow-up here in 1 week.

## 2024-07-25 NOTE — Assessment & Plan Note (Signed)
 Patient is continuing to wean down on gabapentin .  He is now on 100 mg twice daily.  The last week he has done very well with his taper.  He will be planning on going down to 100 mg daily in the next few days.  He is having significant issues with withdrawal that is currently being managed with clonidine , hydroxyzine , and Seroquel .  Will continue these medications for now until he is fully off of gabapentin .  He will follow-up with me in 1 week.

## 2024-07-25 NOTE — Assessment & Plan Note (Signed)
 Will refill his Celebrex  today.

## 2024-07-25 NOTE — Progress Notes (Signed)
 "   Geoffrey West is a 69 y.o. male who presents today for an office visit.  Assessment/Plan:  Chronic Problems Addressed Today: Radiculopathy, lumbosacral region Patient is continuing to wean down on gabapentin .  He is now on 100 mg twice daily.  The last week he has done very well with his taper.  He will be planning on going down to 100 mg daily in the next few days.  He is having significant issues with withdrawal that is currently being managed with clonidine , hydroxyzine , and Seroquel .  Will continue these medications for now until he is fully off of gabapentin .  He will follow-up with me in 1 week.  Anxiety Anxiety much better controlled today.  This is mostly related to his gabapentin  withdrawal over the last few weeks.  He is currently taking hydroxyzine  25 mg in the morning in the afternoon and Seroquel  25 mg at night.  Also taking clonidine  0.2 mg in the morning and with 0.1 mg in the afternoon at night.  He will continue current regimen for now until he is able to wean off the gabapentin  fully.  Follow-up here in 1 week.  Joint pain Will refill his Celebrex  today.  Essential hypertension Initially elevated today. At goal on recheck.  He is having some lower extremity edema which he contributes to the gabapentin  though his amlodipine  is likely contributing.  Will switch his amlodipine  5 mg daily to losartan  50 mg daily.  He will continue the propranolol  80 mg daily as well as the clonidine  0.2 mg in the morning 0.1 mg in the afternoon and 0.1 mg at night.  Follow-up here in 1 week.     Subjective:  HPI:  See assessment / plan for status of chronic conditions.   Discussed the use of AI scribe software for clinical note transcription with the patient, who gave verbal consent to proceed.  History of Present Illness Geoffrey West is a 69 year old male who presents for a weekly follow-up regarding gabapentin  tapering and medication management.  He is in the process of  tapering off gabapentin , currently taking 100 mg in the morning and 100 mg at night since Tuesday. The transition has been smooth, with no significant issues. He attributes the stability to the concurrent use of hydroxyzine  and Seroquel , which he believes are helping manage any potential side effects.  His anxiety levels have been stable, and there has been no significant change in pain levels since reducing gabapentin . His ankles, which were previously a concern, are noted to be better. He is considering a switch from amlodipine  to losartan , suspecting amlodipine  may contribute to leg swelling and pain.  He is currently taking Celebrex , Lasix , Seroquel , hydroxyzine , and clonidine . He is inquiring about refills for Celebrex  and Lasix . He is also interested in exploring the use of peptides, specifically BPC 157, for inflammation and pain management, although he has not yet started this treatment.  He mentions that he is able to go to the gym and engage in physical activity, which he finds beneficial for his overall well-being.         Objective:  Physical Exam: BP 126/86 (BP Location: Right Arm, Patient Position: Sitting, Cuff Size: Normal)   Pulse 60   Temp 98.1 F (36.7 C) (Temporal)   Ht 5' 11 (1.803 m)   Wt 223 lb 9.6 oz (101.4 kg)   SpO2 97%   BMI 31.19 kg/m   Gen: No acute distress, resting comfortably CV: Regular rate and  rhythm with no murmurs appreciated Pulm: Normal work of breathing, clear to auscultation bilaterally with no crackles, wheezes, or rhonchi Neuro: Grossly normal, moves all extremities Psych: Normal affect and thought content      Geoffrey Aube M. Kennyth, MD 07/25/2024 8:37 AM  "

## 2024-07-25 NOTE — Assessment & Plan Note (Signed)
 Initially elevated today. At goal on recheck.  He is having some lower extremity edema which he contributes to the gabapentin  though his amlodipine  is likely contributing.  Will switch his amlodipine  5 mg daily to losartan  50 mg daily.  He will continue the propranolol  80 mg daily as well as the clonidine  0.2 mg in the morning 0.1 mg in the afternoon and 0.1 mg at night.  Follow-up here in 1 week.

## 2024-07-25 NOTE — Patient Instructions (Signed)
 It was very nice to see you today!  VISIT SUMMARY: During your visit, we discussed your progress with tapering off gabapentin  and managing your medications. You are currently taking 100 mg of gabapentin  twice daily, and the transition has been smooth. We also addressed your hypertension, pain management, and anxiety.  YOUR PLAN: GABAPENTIN  TAPER AND WITHDRAWAL MANAGEMENT: You are in the process of tapering off gabapentin  and are currently taking 100 mg in the morning and 100 mg at night. -Continue tapering gabapentin  to 100 mg once daily at night. -Maintain hydroxyzine  and Seroquel  until gabapentin  is fully discontinued. -Reassess in one week.  ESSENTIAL HYPERTENSION: You have discontinued amlodipine  due to potential leg swelling and pain and have started taking losartan . -Monitor for side effects, particularly cough.  LUMBOSACRAL RADICULOPATHY: Your pain management continues with Celebrex , and there has been no significant change in pain with the gabapentin  tapering. -Continue taking Celebrex  for pain management. -Engage in physical activity and exercise.  ANXIETY DISORDER: Your anxiety is well-managed with hydroxyzine  and Seroquel , with no significant changes during the gabapentin  tapering. -Continue taking hydroxyzine  and Seroquel  for anxiety management.  Return in about 1 week (around 08/01/2024) for Follow Up.   Take care, Dr Kennyth  PLEASE NOTE:  If you had any lab tests, please let us  know if you have not heard back within a few days. You may see your results on mychart before we have a chance to review them but we will give you a call once they are reviewed by us .   If we ordered any referrals today, please let us  know if you have not heard from their office within the next week.   If you had any urgent prescriptions sent in today, please check with the pharmacy within an hour of our visit to make sure the prescription was transmitted appropriately.   Please try these tips to  maintain a healthy lifestyle:  Eat at least 3 REAL meals and 1-2 snacks per day.  Aim for no more than 5 hours between eating.  If you eat breakfast, please do so within one hour of getting up.   Each meal should contain half fruits/vegetables, one quarter protein, and one quarter carbs (no bigger than a computer mouse)  Cut down on sweet beverages. This includes juice, soda, and sweet tea.   Drink at least 1 glass of water with each meal and aim for at least 8 glasses per day  Exercise at least 150 minutes every week.

## 2024-08-01 ENCOUNTER — Encounter: Payer: Self-pay | Admitting: Family Medicine

## 2024-08-01 ENCOUNTER — Ambulatory Visit: Admitting: Family Medicine

## 2024-08-01 VITALS — BP 130/80 | HR 58 | Temp 97.2°F | Ht 71.0 in | Wt 222.4 lb

## 2024-08-01 DIAGNOSIS — I10 Essential (primary) hypertension: Secondary | ICD-10-CM

## 2024-08-01 DIAGNOSIS — M5417 Radiculopathy, lumbosacral region: Secondary | ICD-10-CM

## 2024-08-01 DIAGNOSIS — F419 Anxiety disorder, unspecified: Secondary | ICD-10-CM

## 2024-08-01 MED ORDER — QUETIAPINE FUMARATE 25 MG PO TABS: 25.0000 mg | ORAL_TABLET | Freq: Every day | ORAL | 0 refills | Status: AC

## 2024-08-01 NOTE — Patient Instructions (Signed)
 It was very nice to see you today!  VISIT SUMMARY: Today, we discussed your gabapentin  tapering process, neuropathy management, and anxiety. We also reviewed your blood pressure management with losartan .  YOUR PLAN: RADICULOPATHY, LUMBOSACRAL REGION: You have chronic radiculopathy with ongoing neuropathic symptoms that fluctuate with gabapentin  tapering and losartan . -Continue tapering gabapentin , with the last dose on Monday. Monitor your symptoms afterward. -Continue taking losartan  for blood pressure management and to help reduce swelling.  ANXIETY DISORDER: Your anxiety has been worsened by gabapentin  withdrawal, and Seroquel 's effectiveness has decreased with reduced gabapentin . -Continue taking hydroxyzine  and Seroquel  until gabapentin  tapering is complete. -Use MyChart for communication to manage anxiety related to medication changes.  ESSENTIAL HYPERTENSION: Your blood pressure is well-controlled with losartan . -Continue taking losartan  50 mg daily.  Return in about 1 week (around 08/08/2024) for Follow Up.   Take care, Dr Kennyth  PLEASE NOTE:  If you had any lab tests, please let us  know if you have not heard back within a few days. You may see your results on mychart before we have a chance to review them but we will give you a call once they are reviewed by us .   If we ordered any referrals today, please let us  know if you have not heard from their office within the next week.   If you had any urgent prescriptions sent in today, please check with the pharmacy within an hour of our visit to make sure the prescription was transmitted appropriately.   Please try these tips to maintain a healthy lifestyle:  Eat at least 3 REAL meals and 1-2 snacks per day.  Aim for no more than 5 hours between eating.  If you eat breakfast, please do so within one hour of getting up.   Each meal should contain half fruits/vegetables, one quarter protein, and one quarter carbs (no bigger than a  computer mouse)  Cut down on sweet beverages. This includes juice, soda, and sweet tea.   Drink at least 1 glass of water with each meal and aim for at least 8 glasses per day  Exercise at least 150 minutes every week.

## 2024-08-01 NOTE — Progress Notes (Signed)
 "  Geoffrey West is a 69 y.o. male who presents today for an office visit.  Assessment/Plan:   Chronic Problems Addressed Today: Radiculopathy, lumbosacral region Patient is doing well with his wean down on gabapentin .  He is now currently on 100 mg daily and doing well.  He is planning on discontinuing in a few days completely.  We did discuss going to every other day dosing however he would like to stop completely.  I will see him back in 1 week for follow-up.  He will continue his other medications that are helping him with his gabapentin  withdrawal for now including the hydroxyzine  25 mg twice daily, Seroquel  25 mg at night, and clonidine  0.2 mg in the morning, 0.1 mg in the afternoon, and 0.1 mg at night.  If she does well weaning off gabapentin  we will begin to wean off the other medications at his next visit here likely starting with the hydroxyzine  and Seroquel .  Essential hypertension We switched his amlodipine  to losartan  at his visit here a week ago due to lower extremity swelling.  He has done well with this.  Lower extremity swelling does seem to be improving as well.  He will continue losartan  50 mg daily and propranolol  80 mg daily.  He is also on clonidine  as above for his withdrawal symptoms.  Recheck in 1 week.  Anxiety Anxiety is well-controlled today.  He was having significant issues with gabapentin  withdrawal over the last several weeks but this is doing better now.  He is now on 100 mg daily and is planning on stopping completely early next week.  As above he will follow-up with me in 1 week.  He will continue his other medications for now including hydroxyzine  25 mg in the morning and in the afternoon and Seroquel  25 mg at night as well as clonidine  0.2 mg in the morning, 0.1 mg in the afternoon, and 0.1 mg at night.  At hopefully we will be able to start weaning off the hydroxyzine  and Seroquel .  our follow-up visit in 1 week,      Subjective:  HPI:  See assessment /  plan for status of chronic conditions.    Discussed the use of AI scribe software for clinical note transcription with the patient, who gave verbal consent to proceed.  History of Present Illness Geoffrey West is a 69 year old male who presents for follow-up regarding gabapentin  tapering and neuropathy management.  He is currently taking gabapentin  once daily. He has been experiencing heightened anxiety due to gabapentin  withdrawal. He is also taking hydroxyzine , Seroquel , and clonidine .  His feet continue to bother him, with the left foot being worse than the right. He describes a 'rigid' feeling in the left foot and notes that both ice and heat provide relief. He has a history of neuropathy, which was the initial reason for starting gabapentin . Swelling in his feet has decreased since switching from amlodipine  to losartan  two or three days ago.  He has been engaging in physical activity and notes that after exercising, his left foot feels different, almost numb, but not as it did initially when his whole left leg was numb. He is hopeful that the pain, numbness, and swelling will continue to improve.          Objective:  Physical Exam: BP 130/80   Pulse (!) 58   Temp (!) 97.2 F (36.2 C) (Temporal)   Ht 5' 11 (1.803 m)   Wt 222 lb 6.4  oz (100.9 kg)   SpO2 99%   BMI 31.02 kg/m   Gen: No acute distress, resting comfortably CV: Regular rate and rhythm with no murmurs appreciated Pulm: Normal work of breathing, clear to auscultation bilaterally with no crackles, wheezes, or rhonchi Neuro: Grossly normal, moves all extremities Psych: Normal affect and thought content      Geoffrey Joy M. Kennyth, MD 08/01/2024 9:27 AM  "

## 2024-08-01 NOTE — Assessment & Plan Note (Signed)
 Anxiety is well-controlled today.  He was having significant issues with gabapentin  withdrawal over the last several weeks but this is doing better now.  He is now on 100 mg daily and is planning on stopping completely early next week.  As above he will follow-up with me in 1 week.  He will continue his other medications for now including hydroxyzine  25 mg in the morning and in the afternoon and Seroquel  25 mg at night as well as clonidine  0.2 mg in the morning, 0.1 mg in the afternoon, and 0.1 mg at night.  At hopefully we will be able to start weaning off the hydroxyzine  and Seroquel .  our follow-up visit in 1 week,

## 2024-08-01 NOTE — Assessment & Plan Note (Signed)
 We switched his amlodipine  to losartan  at his visit here a week ago due to lower extremity swelling.  He has done well with this.  Lower extremity swelling does seem to be improving as well.  He will continue losartan  50 mg daily and propranolol  80 mg daily.  He is also on clonidine  as above for his withdrawal symptoms.  Recheck in 1 week.

## 2024-08-01 NOTE — Assessment & Plan Note (Signed)
 Patient is doing well with his wean down on gabapentin .  He is now currently on 100 mg daily and doing well.  He is planning on discontinuing in a few days completely.  We did discuss going to every other day dosing however he would like to stop completely.  I will see him back in 1 week for follow-up.  He will continue his other medications that are helping him with his gabapentin  withdrawal for now including the hydroxyzine  25 mg twice daily, Seroquel  25 mg at night, and clonidine  0.2 mg in the morning, 0.1 mg in the afternoon, and 0.1 mg at night.  If she does well weaning off gabapentin  we will begin to wean off the other medications at his next visit here likely starting with the hydroxyzine  and Seroquel .

## 2024-08-08 ENCOUNTER — Ambulatory Visit: Admitting: Family Medicine

## 2024-10-14 ENCOUNTER — Ambulatory Visit

## 2024-12-08 ENCOUNTER — Encounter: Admitting: Family Medicine
